# Patient Record
Sex: Female | Born: 1955 | Race: Black or African American | Hispanic: No | Smoking: Former smoker
Health system: Southern US, Community
[De-identification: ages and names within clinical notes are randomized; demographics above are authoritative.]

## PROBLEM LIST (undated history)

## (undated) DIAGNOSIS — Z973 Presence of spectacles and contact lenses: Secondary | ICD-10-CM

## (undated) DIAGNOSIS — F1010 Alcohol abuse, uncomplicated: Secondary | ICD-10-CM

## (undated) DIAGNOSIS — K219 Gastro-esophageal reflux disease without esophagitis: Secondary | ICD-10-CM

## (undated) DIAGNOSIS — Z923 Personal history of irradiation: Secondary | ICD-10-CM

## (undated) DIAGNOSIS — J449 Chronic obstructive pulmonary disease, unspecified: Secondary | ICD-10-CM

## (undated) DIAGNOSIS — H9190 Unspecified hearing loss, unspecified ear: Secondary | ICD-10-CM

## (undated) DIAGNOSIS — F810 Specific reading disorder: Secondary | ICD-10-CM

## (undated) DIAGNOSIS — R232 Flushing: Secondary | ICD-10-CM

## (undated) DIAGNOSIS — T7840XA Allergy, unspecified, initial encounter: Secondary | ICD-10-CM

## (undated) DIAGNOSIS — R7989 Other specified abnormal findings of blood chemistry: Secondary | ICD-10-CM

## (undated) DIAGNOSIS — F8181 Disorder of written expression: Secondary | ICD-10-CM

## (undated) DIAGNOSIS — J45909 Unspecified asthma, uncomplicated: Secondary | ICD-10-CM

## (undated) DIAGNOSIS — Z972 Presence of dental prosthetic device (complete) (partial): Secondary | ICD-10-CM

## (undated) DIAGNOSIS — J439 Emphysema, unspecified: Secondary | ICD-10-CM

## (undated) DIAGNOSIS — J302 Other seasonal allergic rhinitis: Secondary | ICD-10-CM

## (undated) DIAGNOSIS — C801 Malignant (primary) neoplasm, unspecified: Secondary | ICD-10-CM

## (undated) HISTORY — DX: Flushing: R23.2

## (undated) HISTORY — PX: BREAST LUMPECTOMY: SHX2

## (undated) HISTORY — PX: ABDOMINAL HYSTERECTOMY: SHX81

## (undated) HISTORY — DX: Allergy, unspecified, initial encounter: T78.40XA

## (undated) HISTORY — DX: Emphysema, unspecified: J43.9

---

## 1998-05-28 ENCOUNTER — Emergency Department (HOSPITAL_COMMUNITY): Admission: EM | Admit: 1998-05-28 | Discharge: 1998-05-28 | Payer: Self-pay | Admitting: Emergency Medicine

## 1998-05-29 ENCOUNTER — Encounter: Payer: Self-pay | Admitting: Emergency Medicine

## 1999-10-01 ENCOUNTER — Encounter: Payer: Self-pay | Admitting: Emergency Medicine

## 1999-10-01 ENCOUNTER — Emergency Department (HOSPITAL_COMMUNITY): Admission: EM | Admit: 1999-10-01 | Discharge: 1999-10-01 | Payer: Self-pay | Admitting: Emergency Medicine

## 1999-10-08 ENCOUNTER — Encounter: Admission: RE | Admit: 1999-10-08 | Discharge: 1999-10-08 | Payer: Self-pay | Admitting: Internal Medicine

## 1999-11-07 ENCOUNTER — Encounter: Admission: RE | Admit: 1999-11-07 | Discharge: 1999-11-07 | Payer: Self-pay | Admitting: Internal Medicine

## 1999-12-20 ENCOUNTER — Encounter: Admission: RE | Admit: 1999-12-20 | Discharge: 1999-12-20 | Payer: Self-pay | Admitting: Internal Medicine

## 2000-08-10 ENCOUNTER — Encounter: Payer: Self-pay | Admitting: Internal Medicine

## 2000-08-10 ENCOUNTER — Inpatient Hospital Stay (HOSPITAL_COMMUNITY): Admission: EM | Admit: 2000-08-10 | Discharge: 2000-08-11 | Payer: Self-pay | Admitting: Emergency Medicine

## 2000-08-18 ENCOUNTER — Encounter: Admission: RE | Admit: 2000-08-18 | Discharge: 2000-08-18 | Payer: Self-pay | Admitting: Internal Medicine

## 2000-08-20 ENCOUNTER — Ambulatory Visit (HOSPITAL_COMMUNITY): Admission: RE | Admit: 2000-08-20 | Discharge: 2000-08-20 | Payer: Self-pay | Admitting: *Deleted

## 2000-08-20 ENCOUNTER — Encounter: Payer: Self-pay | Admitting: *Deleted

## 2000-08-24 ENCOUNTER — Encounter: Admission: RE | Admit: 2000-08-24 | Discharge: 2000-08-24 | Payer: Self-pay | Admitting: Internal Medicine

## 2000-08-24 ENCOUNTER — Ambulatory Visit (HOSPITAL_COMMUNITY): Admission: RE | Admit: 2000-08-24 | Discharge: 2000-08-24 | Payer: Self-pay | Admitting: Internal Medicine

## 2000-09-18 ENCOUNTER — Encounter: Admission: RE | Admit: 2000-09-18 | Discharge: 2000-09-18 | Payer: Self-pay | Admitting: Internal Medicine

## 2001-04-26 ENCOUNTER — Emergency Department (HOSPITAL_COMMUNITY): Admission: EM | Admit: 2001-04-26 | Discharge: 2001-04-26 | Payer: Self-pay | Admitting: Emergency Medicine

## 2001-04-26 ENCOUNTER — Encounter: Payer: Self-pay | Admitting: Emergency Medicine

## 2001-07-26 ENCOUNTER — Emergency Department (HOSPITAL_COMMUNITY): Admission: EM | Admit: 2001-07-26 | Discharge: 2001-07-26 | Payer: Self-pay | Admitting: Emergency Medicine

## 2001-08-02 ENCOUNTER — Emergency Department (HOSPITAL_COMMUNITY): Admission: EM | Admit: 2001-08-02 | Discharge: 2001-08-02 | Payer: Self-pay

## 2001-11-17 ENCOUNTER — Encounter: Payer: Self-pay | Admitting: Emergency Medicine

## 2001-11-17 ENCOUNTER — Emergency Department (HOSPITAL_COMMUNITY): Admission: EM | Admit: 2001-11-17 | Discharge: 2001-11-17 | Payer: Self-pay | Admitting: Emergency Medicine

## 2002-07-22 ENCOUNTER — Emergency Department (HOSPITAL_COMMUNITY): Admission: EM | Admit: 2002-07-22 | Discharge: 2002-07-23 | Payer: Self-pay | Admitting: Emergency Medicine

## 2002-07-23 ENCOUNTER — Encounter: Payer: Self-pay | Admitting: Emergency Medicine

## 2002-09-12 ENCOUNTER — Emergency Department (HOSPITAL_COMMUNITY): Admission: EM | Admit: 2002-09-12 | Discharge: 2002-09-12 | Payer: Self-pay | Admitting: Emergency Medicine

## 2003-07-14 ENCOUNTER — Emergency Department (HOSPITAL_COMMUNITY): Admission: EM | Admit: 2003-07-14 | Discharge: 2003-07-14 | Payer: Self-pay | Admitting: Emergency Medicine

## 2003-08-14 ENCOUNTER — Emergency Department (HOSPITAL_COMMUNITY): Admission: EM | Admit: 2003-08-14 | Discharge: 2003-08-14 | Payer: Self-pay | Admitting: Emergency Medicine

## 2003-09-12 ENCOUNTER — Emergency Department (HOSPITAL_COMMUNITY): Admission: EM | Admit: 2003-09-12 | Discharge: 2003-09-12 | Payer: Self-pay | Admitting: Emergency Medicine

## 2003-09-27 ENCOUNTER — Emergency Department (HOSPITAL_COMMUNITY): Admission: EM | Admit: 2003-09-27 | Discharge: 2003-09-27 | Payer: Self-pay | Admitting: Otolaryngology

## 2004-03-14 ENCOUNTER — Emergency Department (HOSPITAL_COMMUNITY): Admission: EM | Admit: 2004-03-14 | Discharge: 2004-03-14 | Payer: Self-pay | Admitting: *Deleted

## 2004-09-18 ENCOUNTER — Emergency Department (HOSPITAL_COMMUNITY): Admission: EM | Admit: 2004-09-18 | Discharge: 2004-09-18 | Payer: Self-pay | Admitting: Emergency Medicine

## 2004-11-26 ENCOUNTER — Emergency Department (HOSPITAL_COMMUNITY): Admission: EM | Admit: 2004-11-26 | Discharge: 2004-11-26 | Payer: Self-pay | Admitting: Emergency Medicine

## 2005-09-27 ENCOUNTER — Emergency Department (HOSPITAL_COMMUNITY): Admission: EM | Admit: 2005-09-27 | Discharge: 2005-09-28 | Payer: Self-pay | Admitting: Emergency Medicine

## 2005-09-28 ENCOUNTER — Emergency Department (HOSPITAL_COMMUNITY): Admission: EM | Admit: 2005-09-28 | Discharge: 2005-09-28 | Payer: Self-pay | Admitting: Emergency Medicine

## 2005-12-08 ENCOUNTER — Ambulatory Visit: Payer: Self-pay | Admitting: Internal Medicine

## 2005-12-09 ENCOUNTER — Ambulatory Visit: Payer: Self-pay | Admitting: Internal Medicine

## 2005-12-12 ENCOUNTER — Ambulatory Visit: Payer: Self-pay | Admitting: Cardiology

## 2005-12-18 ENCOUNTER — Ambulatory Visit: Payer: Self-pay | Admitting: Family Medicine

## 2005-12-30 ENCOUNTER — Ambulatory Visit: Payer: Self-pay | Admitting: Cardiology

## 2005-12-30 ENCOUNTER — Ambulatory Visit (HOSPITAL_COMMUNITY): Admission: RE | Admit: 2005-12-30 | Discharge: 2005-12-30 | Payer: Self-pay | Admitting: Cardiology

## 2005-12-30 ENCOUNTER — Encounter: Payer: Self-pay | Admitting: Cardiology

## 2006-01-07 ENCOUNTER — Ambulatory Visit: Payer: Self-pay | Admitting: Cardiology

## 2006-02-09 ENCOUNTER — Emergency Department (HOSPITAL_COMMUNITY): Admission: EM | Admit: 2006-02-09 | Discharge: 2006-02-09 | Payer: Self-pay | Admitting: Emergency Medicine

## 2006-02-10 ENCOUNTER — Emergency Department (HOSPITAL_COMMUNITY): Admission: EM | Admit: 2006-02-10 | Discharge: 2006-02-10 | Payer: Self-pay | Admitting: Emergency Medicine

## 2006-02-13 ENCOUNTER — Ambulatory Visit: Payer: Self-pay | Admitting: *Deleted

## 2006-03-23 ENCOUNTER — Ambulatory Visit: Payer: Self-pay | Admitting: Internal Medicine

## 2006-04-02 ENCOUNTER — Ambulatory Visit (HOSPITAL_COMMUNITY): Admission: RE | Admit: 2006-04-02 | Discharge: 2006-04-02 | Payer: Self-pay | Admitting: Internal Medicine

## 2006-04-09 ENCOUNTER — Ambulatory Visit: Payer: Self-pay | Admitting: Family Medicine

## 2006-05-15 ENCOUNTER — Ambulatory Visit: Payer: Self-pay | Admitting: Internal Medicine

## 2006-07-14 ENCOUNTER — Ambulatory Visit: Payer: Self-pay | Admitting: Internal Medicine

## 2006-11-19 ENCOUNTER — Ambulatory Visit: Payer: Self-pay | Admitting: Internal Medicine

## 2006-12-30 ENCOUNTER — Emergency Department (HOSPITAL_COMMUNITY): Admission: EM | Admit: 2006-12-30 | Discharge: 2006-12-30 | Payer: Self-pay | Admitting: Emergency Medicine

## 2007-02-11 ENCOUNTER — Ambulatory Visit: Payer: Self-pay | Admitting: Internal Medicine

## 2007-02-12 ENCOUNTER — Ambulatory Visit (HOSPITAL_COMMUNITY): Admission: RE | Admit: 2007-02-12 | Discharge: 2007-02-12 | Payer: Self-pay | Admitting: Internal Medicine

## 2007-03-03 ENCOUNTER — Encounter (INDEPENDENT_AMBULATORY_CARE_PROVIDER_SITE_OTHER): Payer: Self-pay | Admitting: *Deleted

## 2007-06-07 ENCOUNTER — Ambulatory Visit: Payer: Self-pay | Admitting: Family Medicine

## 2007-07-04 ENCOUNTER — Ambulatory Visit: Payer: Self-pay | Admitting: Hospitalist

## 2007-07-06 ENCOUNTER — Inpatient Hospital Stay (HOSPITAL_COMMUNITY): Admission: EM | Admit: 2007-07-06 | Discharge: 2007-07-06 | Payer: Self-pay | Admitting: Emergency Medicine

## 2007-07-09 ENCOUNTER — Ambulatory Visit: Payer: Self-pay | Admitting: Internal Medicine

## 2007-07-09 LAB — CONVERTED CEMR LAB
Nitrite: NEGATIVE
Protein, ur: NEGATIVE mg/dL
Urine Glucose: NEGATIVE mg/dL
pH: 6 (ref 5.0–8.0)

## 2007-08-06 ENCOUNTER — Encounter: Payer: Self-pay | Admitting: Family Medicine

## 2007-08-06 ENCOUNTER — Ambulatory Visit: Payer: Self-pay | Admitting: Internal Medicine

## 2007-08-06 ENCOUNTER — Other Ambulatory Visit: Admission: RE | Admit: 2007-08-06 | Discharge: 2007-08-06 | Payer: Self-pay | Admitting: Family Medicine

## 2007-09-08 ENCOUNTER — Ambulatory Visit: Payer: Self-pay | Admitting: Internal Medicine

## 2007-09-08 LAB — CONVERTED CEMR LAB
ALT: 10 units/L (ref 0–35)
AST: 17 units/L (ref 0–37)
Alkaline Phosphatase: 91 units/L (ref 39–117)
Amphetamine Screen, Ur: NEGATIVE
BUN: 10 mg/dL (ref 6–23)
Barbiturate Quant, Ur: NEGATIVE
Basophils Absolute: 0 10*3/uL (ref 0.0–0.1)
Basophils Relative: 0 % (ref 0–1)
Benzodiazepines.: NEGATIVE
Calcium: 9.1 mg/dL (ref 8.4–10.5)
Chloride: 108 meq/L (ref 96–112)
Creatinine, Ser: 0.76 mg/dL (ref 0.40–1.20)
Eosinophils Absolute: 0.1 10*3/uL (ref 0.0–0.7)
Eosinophils Relative: 1 % (ref 0–5)
HCT: 39.4 % (ref 36.0–46.0)
HDL: 65 mg/dL (ref >39)
Hemoglobin: 13.4 g/dL (ref 12.0–15.0)
Lymphocytes Relative: 45 % (ref 12–46)
MCHC: 34 g/dL (ref 30.0–36.0)
Methadone: NEGATIVE
Monocytes Absolute: 0.6 10*3/uL (ref 0.1–1.0)
Platelets: 306 10*3/uL (ref 150–400)
Propoxyphene: NEGATIVE
RDW: 14.9 % (ref 11.5–15.5)
Total Bilirubin: 0.2 mg/dL — ABNORMAL LOW (ref 0.3–1.2)
Total CHOL/HDL Ratio: 2.3
VLDL: 24 mg/dL (ref 0–40)
Vitamin B-12: 261 pg/mL (ref 211–911)

## 2007-09-17 ENCOUNTER — Ambulatory Visit: Payer: Self-pay | Admitting: Internal Medicine

## 2007-09-23 ENCOUNTER — Ambulatory Visit (HOSPITAL_COMMUNITY): Admission: RE | Admit: 2007-09-23 | Discharge: 2007-09-23 | Payer: Self-pay | Admitting: Family Medicine

## 2007-10-21 ENCOUNTER — Encounter: Admission: RE | Admit: 2007-10-21 | Discharge: 2007-10-21 | Payer: Self-pay | Admitting: Family Medicine

## 2007-10-21 ENCOUNTER — Encounter (INDEPENDENT_AMBULATORY_CARE_PROVIDER_SITE_OTHER): Payer: Self-pay | Admitting: Diagnostic Radiology

## 2007-10-29 ENCOUNTER — Ambulatory Visit: Payer: Self-pay | Admitting: Family Medicine

## 2007-11-23 ENCOUNTER — Encounter (INDEPENDENT_AMBULATORY_CARE_PROVIDER_SITE_OTHER): Payer: Self-pay | Admitting: General Surgery

## 2007-11-23 ENCOUNTER — Ambulatory Visit (HOSPITAL_BASED_OUTPATIENT_CLINIC_OR_DEPARTMENT_OTHER): Admission: RE | Admit: 2007-11-23 | Discharge: 2007-11-23 | Payer: Self-pay | Admitting: General Surgery

## 2007-11-23 ENCOUNTER — Encounter: Admission: RE | Admit: 2007-11-23 | Discharge: 2007-11-23 | Payer: Self-pay | Admitting: General Surgery

## 2007-12-13 ENCOUNTER — Ambulatory Visit: Payer: Self-pay | Admitting: Oncology

## 2007-12-16 ENCOUNTER — Ambulatory Visit: Admission: RE | Admit: 2007-12-16 | Discharge: 2008-03-07 | Payer: Self-pay | Admitting: Radiation Oncology

## 2007-12-28 LAB — COMPREHENSIVE METABOLIC PANEL
Albumin: 4.6 g/dL (ref 3.5–5.2)
BUN: 8 mg/dL (ref 6–23)
CO2: 20 mEq/L (ref 19–32)
Calcium: 9.1 mg/dL (ref 8.4–10.5)
Chloride: 106 mEq/L (ref 96–112)
Glucose, Bld: 78 mg/dL (ref 70–99)
Potassium: 4 mEq/L (ref 3.5–5.3)
Total Protein: 7.4 g/dL (ref 6.0–8.3)

## 2007-12-28 LAB — CBC WITH DIFFERENTIAL/PLATELET
Basophils Absolute: 0 10*3/uL (ref 0.0–0.1)
Eosinophils Absolute: 0.1 10*3/uL (ref 0.0–0.5)
HGB: 13.1 g/dL (ref 11.6–15.9)
MCV: 104.8 fL — ABNORMAL HIGH (ref 81.0–101.0)
MONO#: 0.4 10*3/uL (ref 0.1–0.9)
MONO%: 6 % (ref 0.0–13.0)
NEUT#: 3.1 10*3/uL (ref 1.5–6.5)
RDW: 13.1 % (ref 11.3–14.5)
WBC: 6.2 10*3/uL (ref 3.9–10.0)
lymph#: 2.6 10*3/uL (ref 0.9–3.3)

## 2008-01-28 ENCOUNTER — Ambulatory Visit: Payer: Self-pay | Admitting: Vascular Surgery

## 2008-01-28 ENCOUNTER — Encounter: Payer: Self-pay | Admitting: Radiation Oncology

## 2008-01-28 ENCOUNTER — Ambulatory Visit (HOSPITAL_COMMUNITY): Admission: RE | Admit: 2008-01-28 | Discharge: 2008-01-28 | Payer: Self-pay | Admitting: Radiation Oncology

## 2008-02-07 ENCOUNTER — Ambulatory Visit: Payer: Self-pay | Admitting: Internal Medicine

## 2008-02-08 ENCOUNTER — Encounter: Payer: Self-pay | Admitting: Family Medicine

## 2008-04-24 ENCOUNTER — Emergency Department (HOSPITAL_COMMUNITY): Admission: EM | Admit: 2008-04-24 | Discharge: 2008-04-24 | Payer: Self-pay | Admitting: Emergency Medicine

## 2008-04-25 ENCOUNTER — Emergency Department (HOSPITAL_COMMUNITY): Admission: EM | Admit: 2008-04-25 | Discharge: 2008-04-25 | Payer: Self-pay | Admitting: Emergency Medicine

## 2008-07-05 ENCOUNTER — Ambulatory Visit: Payer: Self-pay | Admitting: Family Medicine

## 2008-07-21 ENCOUNTER — Encounter: Payer: Self-pay | Admitting: Family Medicine

## 2008-07-21 ENCOUNTER — Ambulatory Visit: Payer: Self-pay | Admitting: Internal Medicine

## 2008-07-25 ENCOUNTER — Ambulatory Visit (HOSPITAL_COMMUNITY): Admission: RE | Admit: 2008-07-25 | Discharge: 2008-07-25 | Payer: Self-pay | Admitting: Family Medicine

## 2008-09-04 ENCOUNTER — Ambulatory Visit: Payer: Self-pay | Admitting: Internal Medicine

## 2008-09-04 ENCOUNTER — Encounter: Payer: Self-pay | Admitting: Family Medicine

## 2008-09-04 LAB — CONVERTED CEMR LAB
ALT: 12 units/L (ref 0–35)
AST: 28 units/L (ref 0–37)
Alkaline Phosphatase: 104 units/L (ref 39–117)
BUN: 10 mg/dL (ref 6–23)
Basophils Relative: 1 % (ref 0–1)
Creatinine, Ser: 0.7 mg/dL (ref 0.40–1.20)
Eosinophils Absolute: 0.1 10*3/uL (ref 0.0–0.7)
HDL: 67 mg/dL (ref >39)
LDL Cholesterol: 15 mg/dL (ref 0–99)
Lymphs Abs: 1.7 10*3/uL (ref 0.7–4.0)
MCHC: 34.7 g/dL (ref 30.0–36.0)
MCV: 99 fL (ref 78.0–100.0)
Neutro Abs: 1.9 10*3/uL (ref 1.7–7.7)
Neutrophils Relative %: 48 % (ref 43–77)
Platelets: 224 10*3/uL (ref 150–400)
RBC: 3.93 M/uL (ref 3.87–5.11)
Sed Rate: 6 mm/hr (ref 0–22)
TSH: 0.901 microintl units/mL (ref 0.350–4.500)
Total CHOL/HDL Ratio: 2
VLDL: 55 mg/dL — ABNORMAL HIGH (ref 0–40)
WBC: 4.1 10*3/uL (ref 4.0–10.5)

## 2008-09-11 ENCOUNTER — Ambulatory Visit: Payer: Self-pay | Admitting: Internal Medicine

## 2008-10-23 ENCOUNTER — Encounter: Admission: RE | Admit: 2008-10-23 | Discharge: 2008-10-23 | Payer: Self-pay | Admitting: General Surgery

## 2008-11-02 ENCOUNTER — Ambulatory Visit: Payer: Self-pay | Admitting: *Deleted

## 2008-11-02 ENCOUNTER — Ambulatory Visit: Payer: Self-pay | Admitting: Internal Medicine

## 2008-11-12 ENCOUNTER — Emergency Department (HOSPITAL_COMMUNITY): Admission: EM | Admit: 2008-11-12 | Discharge: 2008-11-12 | Payer: Self-pay | Admitting: Emergency Medicine

## 2008-11-16 ENCOUNTER — Ambulatory Visit: Payer: Self-pay | Admitting: Internal Medicine

## 2008-11-30 ENCOUNTER — Ambulatory Visit: Payer: Self-pay | Admitting: Family Medicine

## 2008-12-13 ENCOUNTER — Ambulatory Visit: Payer: Self-pay | Admitting: Internal Medicine

## 2008-12-20 ENCOUNTER — Ambulatory Visit (HOSPITAL_COMMUNITY): Admission: RE | Admit: 2008-12-20 | Discharge: 2008-12-20 | Payer: Self-pay | Admitting: Family Medicine

## 2009-01-01 ENCOUNTER — Ambulatory Visit: Payer: Self-pay | Admitting: Internal Medicine

## 2009-04-27 ENCOUNTER — Ambulatory Visit: Payer: Self-pay | Admitting: Internal Medicine

## 2009-06-01 ENCOUNTER — Ambulatory Visit: Payer: Self-pay | Admitting: Family Medicine

## 2009-07-13 ENCOUNTER — Ambulatory Visit: Payer: Self-pay | Admitting: Family Medicine

## 2009-09-25 ENCOUNTER — Ambulatory Visit: Payer: Self-pay | Admitting: Family Medicine

## 2009-09-25 LAB — CONVERTED CEMR LAB
AST: 21 units/L (ref 0–37)
Alkaline Phosphatase: 150 units/L — ABNORMAL HIGH (ref 39–117)
BUN: 4 mg/dL — ABNORMAL LOW (ref 6–23)
Calcium: 9.4 mg/dL (ref 8.4–10.5)
Creatinine, Ser: 0.71 mg/dL (ref 0.40–1.20)
HDL: 46 mg/dL (ref >39)
TSH: 0.905 microintl units/mL (ref 0.350–4.500)
Total Bilirubin: 0.3 mg/dL (ref 0.3–1.2)
Total CHOL/HDL Ratio: 3.2
VLDL: 14 mg/dL (ref 0–40)

## 2009-09-27 ENCOUNTER — Emergency Department (HOSPITAL_COMMUNITY): Admission: EM | Admit: 2009-09-27 | Discharge: 2009-09-27 | Payer: Self-pay | Admitting: Emergency Medicine

## 2009-10-24 ENCOUNTER — Encounter: Admission: RE | Admit: 2009-10-24 | Discharge: 2009-10-24 | Payer: Self-pay | Admitting: General Surgery

## 2009-10-30 ENCOUNTER — Ambulatory Visit: Payer: Self-pay | Admitting: Family Medicine

## 2010-02-06 ENCOUNTER — Ambulatory Visit: Payer: Self-pay | Admitting: Family Medicine

## 2010-02-20 ENCOUNTER — Ambulatory Visit (HOSPITAL_COMMUNITY): Admission: RE | Admit: 2010-02-20 | Discharge: 2010-02-20 | Payer: Self-pay | Admitting: Gastroenterology

## 2010-02-22 ENCOUNTER — Ambulatory Visit: Payer: Self-pay | Admitting: Family Medicine

## 2010-03-01 ENCOUNTER — Ambulatory Visit (HOSPITAL_COMMUNITY): Admission: RE | Admit: 2010-03-01 | Discharge: 2010-03-01 | Payer: Self-pay | Admitting: Family Medicine

## 2010-04-18 ENCOUNTER — Ambulatory Visit: Payer: Self-pay | Admitting: Family Medicine

## 2010-04-22 ENCOUNTER — Encounter (INDEPENDENT_AMBULATORY_CARE_PROVIDER_SITE_OTHER): Payer: Self-pay | Admitting: Family Medicine

## 2010-04-22 ENCOUNTER — Ambulatory Visit: Payer: Self-pay | Admitting: Internal Medicine

## 2010-07-07 ENCOUNTER — Encounter: Payer: Self-pay | Admitting: Family Medicine

## 2010-08-16 ENCOUNTER — Other Ambulatory Visit: Payer: Self-pay | Admitting: Gastroenterology

## 2010-08-20 ENCOUNTER — Ambulatory Visit
Admission: RE | Admit: 2010-08-20 | Discharge: 2010-08-20 | Disposition: A | Discharge status: Home / Self Care | Payer: Self-pay | Source: Ambulatory Visit | Attending: Gastroenterology | Admitting: Gastroenterology

## 2010-08-28 ENCOUNTER — Ambulatory Visit (HOSPITAL_COMMUNITY)
Admission: RE | Admit: 2010-08-28 | Discharge: 2010-08-28 | Disposition: A | Discharge status: Home / Self Care | Payer: Self-pay | Source: Ambulatory Visit | Attending: Gastroenterology | Admitting: Gastroenterology

## 2010-08-28 DIAGNOSIS — K2990 Gastroduodenitis, unspecified, without bleeding: Secondary | ICD-10-CM | POA: Insufficient documentation

## 2010-08-28 DIAGNOSIS — J438 Other emphysema: Secondary | ICD-10-CM | POA: Insufficient documentation

## 2010-08-28 DIAGNOSIS — R1310 Dysphagia, unspecified: Secondary | ICD-10-CM | POA: Insufficient documentation

## 2010-08-28 DIAGNOSIS — K2970 Gastritis, unspecified, without bleeding: Secondary | ICD-10-CM | POA: Insufficient documentation

## 2010-08-28 DIAGNOSIS — Q391 Atresia of esophagus with tracheo-esophageal fistula: Secondary | ICD-10-CM | POA: Insufficient documentation

## 2010-08-28 DIAGNOSIS — J309 Allergic rhinitis, unspecified: Secondary | ICD-10-CM | POA: Insufficient documentation

## 2010-08-28 DIAGNOSIS — E538 Deficiency of other specified B group vitamins: Secondary | ICD-10-CM | POA: Insufficient documentation

## 2010-08-28 DIAGNOSIS — Z7982 Long term (current) use of aspirin: Secondary | ICD-10-CM | POA: Insufficient documentation

## 2010-08-28 DIAGNOSIS — Q393 Congenital stenosis and stricture of esophagus: Secondary | ICD-10-CM | POA: Insufficient documentation

## 2010-08-28 DIAGNOSIS — K5730 Diverticulosis of large intestine without perforation or abscess without bleeding: Secondary | ICD-10-CM | POA: Insufficient documentation

## 2010-08-28 DIAGNOSIS — Z853 Personal history of malignant neoplasm of breast: Secondary | ICD-10-CM | POA: Insufficient documentation

## 2010-08-28 DIAGNOSIS — K648 Other hemorrhoids: Secondary | ICD-10-CM | POA: Insufficient documentation

## 2010-08-28 DIAGNOSIS — Z1211 Encounter for screening for malignant neoplasm of colon: Secondary | ICD-10-CM | POA: Insufficient documentation

## 2010-09-03 NOTE — Op Note (Signed)
  [name], DELAHOZ              ACCOUNT NO.:  [id number]  MEDICAL RECORD NO.:  [id number]           PATIENT TYPE:  O  LOCATION:  WLEN                         FACILITY:  [facility]  PHYSICIAN:  Shirley Friar, MDDATE OF BIRTH:  [birth year]  DATE OF PROCEDURE:  08/28/2010 DATE OF DISCHARGE:                              OPERATIVE REPORT   PROCEDURE:  Upper endoscopy.  INDICATIONS:  Dysphagia, abnormal barium swallow showing a proximal esophageal web.  MEDICATIONS: 1. Fentanyl 25 mcg IV. 2. Versed 3 mg IV. 3. Cetacaine spray x2. 4. Additional medicine given for preceding colonoscopy.  FINDINGS:  Endoscope was inserted through oropharynx and esophagus was intubated.  The upper esophageal sphincter was noted to be approximately 15 cm from the incisors and in this area was a nonobstructing benign appearing small esophageal web.  Endoscope was easily advanced down into the distal esophagus where the gastroesophageal junction was noted to be 38 cm from the incisors.  The endoscope was then advanced into the stomach down to the duodenal bulb and second portion of the duodenum. Second portion of the duodenum and duodenal bulb were unremarkable.  In the distal stomach was focal edema and erythema consistent with mild gastritis.  No other mucosal abnormalities were seen in the stomach. Retroflexion revealed normal proximal stomach.  Endoscope was straightened and withdrawn back into the proximal esophagus where the small esophageal web was seen.  Endoscope was then advanced into the stomach again and a spring tip guidewire was advanced through the working channel of the endoscope.  With visualization of the tip of the wire in the stomach, the endoscope was pulled back keeping the wire in place and the endoscope was withdrawn.  The guidewire markings were noted and adequate length distal to the gastroesophageal junction was noted.  A 14- mm Savary dilator was inserted over the guidewire  with minimal resistance and small amount of heme seen on pullback.  Then in sequential fashion, a 15-mm and 16-mm dilators were inserted over the guidewire with mild resistance to 15 mm and mild resistance to 16 mm with heme noted on both dilations.  After dilation with the 16-mm dilator, the dilator and guidewire were removed together.  The endoscope was advanced back into the oropharynx and the esophagus.  Proximal esophagus was visualized to note successful dilation.  Endoscope was then withdrawn to confirm the above findings.  ASSESSMENT: 1. 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 2. Mild gastritis.  PLAN: 1. Continue twice a day Protonix for heartburn. 2. If dysphagia symptoms recur, then may need additional therapy. 3. Followup as needed.     Shirley Friar, MD     VCS/MEDQ  D:  08/28/2010  T:  08/28/2010  Job:  [id number]  cc:   Maurice March, M.D. Fax: [phone]  Electronically Signed by Charlott Rakes MD on 09/03/2010 03:07:52 PM

## 2010-09-03 NOTE — Op Note (Signed)
  [name], GARCIAGARCIA              ACCOUNT NO.:  [id number]  MEDICAL RECORD NO.:  [id number]           PATIENT TYPE:  O  LOCATION:  WLEN                         FACILITY:  [facility]  PHYSICIAN:  Shirley Friar, MDDATE OF BIRTH:  10-07-1955  DATE OF PROCEDURE: DATE OF DISCHARGE:                              OPERATIVE REPORT   INDICATIONS:  Screening.  MEDICATIONS:  Fentanyl 100 mcg IV, Versed 9 mg IV.  FINDINGS:  Rectal exam was unremarkable.  Pediatric colonoscope was inserted into a well prepped colon, advanced to cecum, where ileocecal valve and appendiceal orifice were identified.  On careful withdrawal of the colonoscope, there were few small diverticula seen in the sigmoid colon.  Retroflexion revealed small internal hemorrhoids.  Colonoscopy was otherwise normal.  ASSESSMENT: 1. Few sigmoid diverticula. 2. Small internal hemorrhoids.  PLAN:  Repeat colonoscopy in 10 years.     Shirley Friar, MD   VCS/MEDQ  D:  08/28/2010  T:  08/28/2010  Job:  [id number]  cc:   Maurice March, M.D. Fax: [phone]  Electronically Signed by Charlott Rakes MD on 09/03/2010 03:05:46 PM

## 2010-09-04 LAB — RAPID URINE DRUG SCREEN, HOSP PERFORMED
Amphetamines: NOT DETECTED
Opiates: NOT DETECTED
Tetrahydrocannabinol: NOT DETECTED

## 2010-09-27 ENCOUNTER — Other Ambulatory Visit: Payer: Self-pay | Admitting: Family Medicine

## 2010-09-27 DIAGNOSIS — Z9889 Other specified postprocedural states: Secondary | ICD-10-CM

## 2010-10-14 ENCOUNTER — Emergency Department (HOSPITAL_COMMUNITY): Payer: Self-pay

## 2010-10-14 ENCOUNTER — Emergency Department (HOSPITAL_COMMUNITY)
Admission: EM | Admit: 2010-10-14 | Discharge: 2010-10-14 | Disposition: A | Discharge status: Home / Self Care | Payer: Self-pay | Attending: Emergency Medicine | Admitting: Emergency Medicine

## 2010-10-14 DIAGNOSIS — J4489 Other specified chronic obstructive pulmonary disease: Secondary | ICD-10-CM | POA: Insufficient documentation

## 2010-10-14 DIAGNOSIS — R05 Cough: Secondary | ICD-10-CM | POA: Insufficient documentation

## 2010-10-14 DIAGNOSIS — R079 Chest pain, unspecified: Secondary | ICD-10-CM | POA: Insufficient documentation

## 2010-10-14 DIAGNOSIS — Z79899 Other long term (current) drug therapy: Secondary | ICD-10-CM | POA: Insufficient documentation

## 2010-10-14 DIAGNOSIS — J449 Chronic obstructive pulmonary disease, unspecified: Secondary | ICD-10-CM | POA: Insufficient documentation

## 2010-10-14 DIAGNOSIS — R748 Abnormal levels of other serum enzymes: Secondary | ICD-10-CM | POA: Insufficient documentation

## 2010-10-14 DIAGNOSIS — K219 Gastro-esophageal reflux disease without esophagitis: Secondary | ICD-10-CM | POA: Insufficient documentation

## 2010-10-14 DIAGNOSIS — R0602 Shortness of breath: Secondary | ICD-10-CM | POA: Insufficient documentation

## 2010-10-14 DIAGNOSIS — R059 Cough, unspecified: Secondary | ICD-10-CM | POA: Insufficient documentation

## 2010-10-14 LAB — DIFFERENTIAL
Basophils Absolute: 0 10*3/uL (ref 0.0–0.1)
Basophils Relative: 0 % (ref 0–1)
Eosinophils Relative: 2 % (ref 0–5)
Monocytes Absolute: 0.5 10*3/uL (ref 0.1–1.0)
Neutro Abs: 4.4 10*3/uL (ref 1.7–7.7)

## 2010-10-14 LAB — POCT CARDIAC MARKERS
Myoglobin, poc: 39.2 ng/mL (ref 12–200)
Myoglobin, poc: 27.3 ng/mL (ref 12–200)

## 2010-10-14 LAB — COMPREHENSIVE METABOLIC PANEL
BUN: 2 mg/dL — ABNORMAL LOW (ref 6–23)
CO2: 20 mEq/L (ref 19–32)
Chloride: 108 mEq/L (ref 96–112)
Creatinine, Ser: 0.54 mg/dL (ref 0.4–1.2)
GFR calc non Af Amer: >60 mL/min (ref >60)
Glucose, Bld: 86 mg/dL (ref 70–99)
Total Bilirubin: 0.3 mg/dL (ref 0.3–1.2)

## 2010-10-14 LAB — CBC
Hemoglobin: 14.8 g/dL (ref 12.0–15.0)
MCHC: 35.2 g/dL (ref 30.0–36.0)
RDW: 13.9 % (ref 11.5–15.5)

## 2010-10-14 MED ORDER — IOHEXOL 300 MG/ML  SOLN
75.0000 mL | Freq: Once | INTRAMUSCULAR | Status: AC | PRN
  Administered 2010-10-14: 75 mL via INTRAVENOUS

## 2010-10-29 NOTE — Discharge Summary (Signed)
[name], [name]              ACCOUNT NO.:  [id number]   MEDICAL RECORD NO.:  [id number]          PATIENT TYPE:  INP   LOCATION:  6703                         FACILITY:  [facility]   PHYSICIAN:  Eliseo Gum, M.D.   DATE OF BIRTH:  01-30-1956   DATE OF ADMISSION:  07/04/2007  DATE OF DISCHARGE:  07/06/2007                               DISCHARGE SUMMARY   CONSULTANTS:  None.   DISCHARGE DIAGNOSES:  1. Pyelonephritis.  2. Macrocytosis.  3. Chronic obstructive pulmonary disease.  4. Gastroesophageal reflux disease.  5. Hysterectomy.   DISCHARGE MEDICATIONS:  1. Ciprofloxacin 400 mg for 7 days.  2. Spiriva 18 mcg one puff inhaled daily.  3. Tylenol 650 mg every 8 hours for pain.  4. Doxepin 25 mg orally at bedtime.  5. Flonase 15 mcg spray, two sprays per nostril daily.  6. Ferrous sulfate 325 mg orally twice a day.  7. Multivitamins one tab daily.  8. Allegra 180 mg orally daily as needed.  9. Levsin 0.125 mg orally every 4 hours as needed.   DISPOSITION:  [name] has an appointment scheduled within 2 weeks  with [facility] for resolution of her current symptoms; also, she has to  follow up her anemia, methylmalonic acid result.   PROCEDURE:  Chest x-ray that shows no active cardiopulmonary disease.   HISTORY OF PRESENT ILLNESS:  [name] is a 55 year old female with no  significant past medical history who presented to the ER complaining of  headache, fever, chills and back pain since 4 days prior to admission.  She also related increase in urine frequency and incontinence since  around the same time.   PHYSICAL EXAM:  Temperature 104, blood pressure 118/72, pulse 110,  respirations 30, oxygen saturation 94% on room air.  LUNGS:  Bilateral air movement, bilateral decreased breath sounds, she  has bilateral rales, no rhonchi.  CARDIOVASCULAR:  S1, S2 normal, regular rhythm and rate, no murmur.  GASTROINTESTINAL:  Bowel sounds positive, soft, some tenderness on  palpation in the suprapubic area.  She was CVA positive on the right  side.  NEUROLOGICAL EXAM:  Alert and oriented, no focal neurologic findings.   LABS ON ADMISSION:  UA hazy, small hemoglobin, leukocyte esterase many.  Micro:  White blood cells 21-50, red blood cells 7-10, many bacteria.  Electrolytes:  Sodium 136, potassium 3.8, chloride 108, bicarbonate 20,  BUN 4, creatinine 0.9.  White blood cell 14.1, hemoglobin 12.3,  platelets 236,000, MCV 800.8.   HOSPITAL COURSE:  1. Pyelonephritis.  On admission patient was febrile.  She was started      on IV ciprofloxacin.  She was having ibuprofen for fever.  We      started her on IV fluid, normal saline 125 mL per hour.  Urine      culture grows E. coli 6000 colonies.  Blood culture negative x2.      White blood cells on the second day of admission decreasing 9.8,      neutrophil absolute count 7.  On the third day of hospitalization      patient was afebrile, decreasing urine  frequency with decreasing of      flank pain.   1. Anemia, macrocytosis.  Patient on admission MCV was 100.1, we were      considering that this may be secondary to alcohol abuse.  Her B12      was lower borderline normal 367.  Methylmalonic acid was ordered,      this is pending.  Folate 20.  Her anemia we are considering that      she might have iron deficiency anemia or anemia of chronic disease.      Her iron level is low, is 22, and her total iron binding capacity      is 272 and her ferritin level was 130.  The ferritin level is      slightly increased but this can be secondary to the acute      infection, so patient needs to be followed up to really determine      the cause of her anemia.  We are going to start her on Iron at this      moment.   1. COPD.  The patient relates no worsening of her dyspnea or cough.      This is pretty stable.  She was continued on Spiriva and on      albuterol inhaler as needed.  She was counseled for smoking       cessation.  X-ray as result above was normal.   DISCHARGE LABORATORY AND VITALS:  On the date of discharge white blood  cell 9.8, hemoglobin 11, hematocrit 32, MCV 101, platelets 205,000.  The  patient was afebrile, temperature 98.3, blood pressure 132/83,  respirations 20, pulse 78, oxygen saturation 99% on room air.  The  patient was discharged in good condition.      Hartley Barefoot, MD  Electronically Signed      Eliseo Gum, M.D.  Electronically Signed    BR/MEDQ  D:  07/06/2007  T:  07/06/2007  Job:  [id number]   cc:   Melvern Banker

## 2010-10-29 NOTE — Op Note (Signed)
[name], [name]              ACCOUNT NO.:  [id number]   MEDICAL RECORD NO.:  [id number]          PATIENT TYPE:  AMB   LOCATION:  DSC                          FACILITY:  [facility]   PHYSICIAN:  Ollen Gross. Vernell Morgans, M.D. DATE OF BIRTH:  10/29/1955   DATE OF PROCEDURE:  11/23/2007  DATE OF DISCHARGE:                               OPERATIVE REPORT   PREOPERATIVE DIAGNOSIS:  Left breast ductal carcinoma in situ.   POSTOPERATIVE DIAGNOSE:  Left breast ductal carcinoma in situ.   PROCEDURES:  Left breast needle-localized lumpectomy and sentinel node  biopsy with injection of blue dye.   SURGEON:  Vernice Jefferson, MD   ANESTHESIA:  General via LMA.   PROCEDURE:  After informed consent was obtained, the patient was brought  to the operating room and placed in the supine position on the operating  room table.  After induction of general anesthesia, the patient's left  breast and axilla were prepped with Betadine and draped in usual sterile  manner.  Early on the day the patient had undergone injection of 1 mCi  of technetium sulfur colloid in the subareolar position.  The patient  earlier in the day had also undergone a wire localization procedure and  the wire was entering the breast superiorly, just lateral to the nipple  and areola, and heading inferiorly.  The 2 mL of methylene blue and 3 mL  of injectable saline were then injected and also in the subareolar  position and the breast was massaged for several minutes.  Using a  NeoProbe a single hot spot was identified in the left axilla.  A small  transverse incision was made in the axilla overlying this hot spot with  a 15-blade knife.  This incision was carried down through the skin and  subcutaneous tissue sharply with the electrocautery until the axilla was  entered.  Blunt dissection was then carried out until a hot blue lymph  node was identified.  This lymph node was excised sharply with the  electrocautery and the lymphatics were  clamped with a hemostat, divided,  and ligated with a 3-0 Vicryl tie.  The ex vivo counts on the sentinel  node were around 25.  There were no other hot spots or palpable lymph  nodes or blue dye identifiable in the axilla.  This was sent as sentinel  node #1.  The deep layer of the wound was then closed with interrupted 3-  0 Vicryl stitches and the skin was closed with a running 4-0 Monocryl  subcuticular stitch.  Attention was then turned to the left breast, a  curvilinear incision was made along the upper portion of the breast,  just beneath the wire and just above the areola.  This was done with 15-  blade knife.  This incision was carried down through the skin and  subcutaneous tissue sharply with electrocautery.  The path of the wire  was able to be identified.  A circular portion of breast tissue was  removed around the path of the wire.  This was taken all the way down to  the chest wall.  This was all done sharply with the electrocautery.  Once the specimen was removed, it was oriented with the paint set  according to the key and the assigned colors to the margins.  The  specimen was then sent for a specimen radiograph, which showed the clip  and the calcification to be in the center of the mass.  It was then sent  to pathology for further evaluation.  Hemostasis was achieved using the  Bovie electrocautery.  The wound was irrigated with copious amounts of  saline.  The wound was infiltrated with 0.25% Marcaine.  The deep layer  of the wound was then closed with interrupted 3-0 Vicryl stitches and  the skin was  closed with a running 4-0 Monocryl subcuticular stitch.  Dermabond  dressings were applied.  The patient tolerated well.  At the in the  case, all needle, sponge, and counts were correct.  The patient was then  awakened and taken to recovery room in stable condition.      Ollen Gross. Vernell Morgans, M.D.  Electronically Signed     PST/MEDQ  D:  11/23/2007  T:  11/24/2007   Job:  [id number]

## 2010-11-01 NOTE — Discharge Summary (Signed)
[facility]  Patient:    [name], [name]                       MRN: [id number] Adm. Date:  [id number] Disc. Date: [id number] Attending:  Phifer, Harriett Sine Welcome Dictator:   Doreatha Martin, M.D.                           Discharge Summary  DISCHARGE DIAGNOSES: 1. Noncardiac chest pain. 2. Gastroesophageal reflux disease. 3. Postmenopausal. 4. Status post hysterectomy. 5. Tobacco abuse.  DISCHARGE MEDICATION:  Prevacid 30 mg p.o. q.d.  HISTORY OF PRESENT ILLNESS:  [name] is a 55 year old, African-American female with past medical history significant for gastroesophageal reflux disease who presents today with complaints of sudden onset of shortness of breath and later with a very brief period of fleeting chest pain that awoke her early this morning.  The patient states that she felt fine before going to bed the night prior to admission.  She states that she had an episode of shortness of breath similar to this one approximately one year ago.  The patient denies any nausea, vomiting or diarrhea.  She also denies fevers, chills or malaise.  No abdominal pain and no palpitations.  She states that the chest pain occurred while in the emergency room today and it was very brief over her left chest without radiation anywhere in her body.  The patient has had a nonproductive cough for the last several months secondary to tobacco abuse.  ALLERGY:  PENICILLIN.  FAMILY HISTORY:  Significant for a sister with some type of heart problems at the age of 66.  PHYSICAL EXAMINATION:  VITAL SIGNS:  Temperature 97.3, pulse 73, blood pressure 116/69, respiratory rate 26, oxygen saturations 98% on room air.  GENERAL:  This is a very, thin, petite, African-American female who is alert and oriented x 3 in no apparent distress.  HEENT:  Normocephalic, atraumatic.  PERRLA.  No oropharyngeal lesions or exudates.  NECK:  No JVD or lymphadenopathy.  No  thyromegaly.  LUNGS:  Rhonchi in left lower lobe, no wheezing with good air movement.  CARDIOVASCULAR:  Regular rate and rhythm with S1, S2, no murmurs, rubs or gallops.  ABDOMEN:  Thin, soft, nontender, nondistended with active bowel sounds.  EXTREMITIES:  No edema.  No cords.  Pedal pulses 2+ bilaterally.  NEUROLOGIC:  Alert and oriented x 3.  Cranial nerves II-XII intact.  No motor or sensory deficits.  LABORATORY DATA AND X-RAY FINDINGS:  White blood count 5.1, hemoglobin 15.0, platelets 286.  Sodium 141, potassium 3.6, chloride 115, CO2 16, BUN 15, creatinine 0.7, glucose 106, calcium 8.2.  Protein 6.1, total bilirubin 0.4, AST 20, ALT 12.  PT 12.9, INR 1.0, PTT 26.  ABG on room air with pH 7.37, pCO2 31, pO2 87.  Chest x-ray with no apparent disease.  EKG with normal sinus rhythm with a slight tachycardia with ventricular response of 101 beats per minute.  No ST or T wave changes.  CT of the chest was negative for PE and CT of the lower extremities was also negative for DVT.  HOSPITAL COURSE:  #1 - ATYPICAL CHEST PAIN:  The patient was admitted to rule out acute myocardial infarction versus pulmonary embolism.  The patient has, at most, two risk factors for coronary artery disease, namely chronic daily tobacco abuse and questionable family history of heart disease at a young age. Myocardial infarction  was ruled out with three sets of negative cardiac enzymes.  Pulmonary embolism was ruled out with a negative CT of the chest and lower extremities.  The patient did not suffer any arrhythmias or further shortness of breath during this admission.  She did complain of fleeting chest pain that was stabbing over her sternum approximately twice during this admission.  EKGs were obtained at the time of the chest pain and no changes were noted.  The patient continued to do well and it is possible that her chest pain could be related to anxiety.  We could not find a physiologic etiology  of her chest pain.  The patient was discharged in stable condition and is to be followed up in our clinic as an outpatient.  #2 - GASTROESOPHAGEAL REFLUX DISEASE:  It is possible that she has been suffering with heartburn and her chest discomfort may be associated to her GERD.  The patient was placed on Protonix 40 mg p.o. q.d. during this admission.  The patient was discharged home on Prevacid 30 mg p.o. q.d. and was given samples for 10 weeks.  #3 - POSTMENOPAUSAL:  The patient, at some point, complained of having had night sweats and some anxiety at night.  She states that this occurred a few times prior to admission while she was at home.  We obtained an FSH level on this patient and it came back with a level of 113, which is consistent with postmenopause.  The patient had been discharged at the time that this result was in.  A note has been made for this issue to be addressed in the outpatient setting.  The patient would probably benefit from starting hormone replacement therapy.  It is also possible that her chest pain is associated with anxiety or with changes that may have something to do with the postmenopausal period.  #4 - ANEMIA:  There is a question of this patient possibly having an anemia. There were two sets of labs on the day of admission that showed a hemoglobin of 15.0 g and subsequently two other sets of labs that showed hemoglobin of 12.0 and 12.1 respectively.  No workup was done in relation to this anemia as it was borderline.  It would be helpful to obtain a level as an outpatient to confirm the presence or absence of an anemia in this patient and for it to be worked up accordingly.  FOLLOWUP:  The patient is to follow up in the acute care clinic in approximately one week. DD:  08/12/00 TD:  08/13/00 Job: [id number]

## 2010-11-01 NOTE — Assessment & Plan Note (Signed)
[facility]                              CARDIOLOGY OFFICE NOTE   ERIC, MORGANTI                       MRN:          [id number]  DATE:01/07/2006                            DOB:          02/12/1956    PRIMARY CARE PHYSICIAN:  Tresa Endo L. Philipp Deputy, MD with [facility]   REASON FOR VISIT:  Followup cardiac testing.   HISTORY OF PRESENT ILLNESS:  I saw Ms. Wagoner back in late June.  She was  referred at that time with a history of chest pain with fairly atypical  features, although with cardiac risk factors, including a strong family  history as well as ongoing tobacco use.  I referred her for a 2D  echocardiogram, which revealed a normal left ventricular function with an  ejection fraction of 65-70% and no regional wall motion abnormalities.  She  had no major valvular abnormalities noted.  She also underwent a myocardial  perfusion study which revealed no clear evidence of ischemia or scar.  This  was at somewhat submaximal work load, although with a peak heart rate of 161  beats per minute.  I reviewed this information with the patient today and  reassured her.  Her electrocardiogram shows a sinus rhythm with occasional  premature supraventricular complexes.  She also tells me that she has been  using Goody powders up to four times a day, and we talked about problems  with reflux and possible peptic ulcer disease or gastritis.  I note that she  is still on Protonix.   ALLERGIES:  No known drug allergies.   PRESENT MEDICATIONS:  1.  Protonix 40 mg p.o. daily.  2.  Allegra 180 mg p.o. daily.  3.  Goody powders 3-4 a day.   REVIEW OF SYSTEMS:  As described in the present illness.   PHYSICAL EXAMINATION:  VITAL SIGNS: Blood pressure 134/70, heart rate 81.  Weight 92 pounds.  NECK:  Supple without obvious jugular venous pressure.  LUNGS:  Clear without labored breathing.  CARDIAC:  Regular rate and rhythm without  murmur, rub or gallop.  EXTREMITIES:  No pitting edema.   IMPRESSION/RECOMMENDATIONS:  1.  Chest pain, fairly atypical and likely noncardiac, based on testing      outlined above.  Would suggest evaluation for other possible etiologies,      particularly gastrointestinal, based on her frequent use of Goody      powders.  She should also strive for risk factor modification and      smoking cessation.  She will plan to continue followup with [facility].  2.  We can see her back as needed.                                Jonelle Sidle, MD    SGM/MedQ  DD:  01/07/2006  DT:  01/07/2006  Job #:  [id number]   cc:   Tresa Endo L. Philipp Deputy, MD

## 2011-03-06 LAB — IRON AND TIBC
Iron: 22 — ABNORMAL LOW
TIBC: 272
UIBC: 250

## 2011-03-06 LAB — URINE CULTURE: Colony Count: 60000

## 2011-03-06 LAB — BASIC METABOLIC PANEL
CO2: 20
Calcium: 7.9 — ABNORMAL LOW
Chloride: 110
GFR calc Af Amer: >60
Sodium: 137

## 2011-03-06 LAB — URINALYSIS, ROUTINE W REFLEX MICROSCOPIC
Nitrite: NEGATIVE
Protein, ur: NEGATIVE
Specific Gravity, Urine: 1.008
Urobilinogen, UA: 1

## 2011-03-06 LAB — COMPREHENSIVE METABOLIC PANEL
ALT: 25
AST: 30
CO2: 22
Chloride: 105
GFR calc Af Amer: >60
GFR calc non Af Amer: >60
Potassium: 3.3 — ABNORMAL LOW
Sodium: 134 — ABNORMAL LOW
Total Bilirubin: 0.5

## 2011-03-06 LAB — CBC
Hemoglobin: 11 — ABNORMAL LOW
MCHC: 33.9
MCHC: 34.1
MCV: 100.8 — ABNORMAL HIGH
MCV: 101 — ABNORMAL HIGH
RBC: 3.18 — ABNORMAL LOW
RBC: 3.59 — ABNORMAL LOW
WBC: 9.8

## 2011-03-06 LAB — DIFFERENTIAL
Basophils Relative: 0
Basophils Relative: 0
Eosinophils Absolute: 0.1
Eosinophils Relative: 1
Lymphs Abs: 1.4
Monocytes Absolute: 0.9
Monocytes Absolute: 1.3 — ABNORMAL HIGH
Monocytes Relative: 13 — ABNORMAL HIGH
Monocytes Relative: 6
Neutro Abs: 7
Neutrophils Relative %: 81 — ABNORMAL HIGH

## 2011-03-06 LAB — URINE MICROSCOPIC-ADD ON

## 2011-03-06 LAB — I-STAT 8, (EC8 V) (CONVERTED LAB)
Bicarbonate: 20.7
Glucose, Bld: 104 — ABNORMAL HIGH
Sodium: 136
TCO2: 22
pCO2, Ven: 35.3 — ABNORMAL LOW
pH, Ven: 7.376 — ABNORMAL HIGH

## 2011-03-06 LAB — RETICULOCYTES: Retic Count, Absolute: 46.9

## 2011-03-06 LAB — CULTURE, BLOOD (ROUTINE X 2)

## 2011-03-06 LAB — FOLATE: Folate: >20

## 2011-03-06 LAB — FERRITIN: Ferritin: 130 (ref 10–291)

## 2011-03-06 LAB — INFLUENZA A+B VIRUS AG-DIRECT(RAPID): Influenza B Ag: NEGATIVE

## 2011-03-13 LAB — CBC
HCT: 33.3 — ABNORMAL LOW
Hemoglobin: 11.8 — ABNORMAL LOW
MCHC: 35.3
MCV: 103.4 — ABNORMAL HIGH
RBC: 3.22 — ABNORMAL LOW
WBC: 6.1

## 2011-03-13 LAB — COMPREHENSIVE METABOLIC PANEL
AST: 28
BUN: 7
CO2: 22
Calcium: 8.8
Chloride: 112
Creatinine, Ser: 0.9
GFR calc non Af Amer: >60
Glucose, Bld: 119 — ABNORMAL HIGH
Total Bilirubin: 0.4

## 2011-03-13 LAB — DIFFERENTIAL
Basophils Absolute: 0
Eosinophils Relative: 2
Lymphocytes Relative: 41
Lymphs Abs: 2.5
Neutrophils Relative %: 49

## 2011-03-13 LAB — PROTIME-INR
INR: 1
Prothrombin Time: 13.3

## 2011-08-02 ENCOUNTER — Encounter (HOSPITAL_COMMUNITY): Payer: Self-pay | Admitting: *Deleted

## 2011-08-02 ENCOUNTER — Emergency Department (HOSPITAL_COMMUNITY)
Admission: EM | Admit: 2011-08-02 | Discharge: 2011-08-02 | Disposition: A | Discharge status: Home / Self Care | Payer: Self-pay | Attending: Emergency Medicine | Admitting: Emergency Medicine

## 2011-08-02 ENCOUNTER — Emergency Department (HOSPITAL_COMMUNITY): Payer: Self-pay

## 2011-08-02 DIAGNOSIS — K219 Gastro-esophageal reflux disease without esophagitis: Secondary | ICD-10-CM | POA: Insufficient documentation

## 2011-08-02 DIAGNOSIS — M25469 Effusion, unspecified knee: Secondary | ICD-10-CM | POA: Insufficient documentation

## 2011-08-02 DIAGNOSIS — F17200 Nicotine dependence, unspecified, uncomplicated: Secondary | ICD-10-CM | POA: Insufficient documentation

## 2011-08-02 DIAGNOSIS — M25569 Pain in unspecified knee: Secondary | ICD-10-CM | POA: Insufficient documentation

## 2011-08-02 DIAGNOSIS — W010XXA Fall on same level from slipping, tripping and stumbling without subsequent striking against object, initial encounter: Secondary | ICD-10-CM | POA: Insufficient documentation

## 2011-08-02 HISTORY — DX: Gastro-esophageal reflux disease without esophagitis: K21.9

## 2011-08-02 MED ORDER — OXYCODONE-ACETAMINOPHEN 5-325 MG PO TABS: 2.0000 | ORAL_TABLET | ORAL | Status: AC | PRN

## 2011-08-02 MED ORDER — IBUPROFEN 800 MG PO TABS: 800.0000 mg | ORAL_TABLET | Freq: Three times a day (TID) | ORAL | Status: AC

## 2011-08-02 MED ORDER — OXYCODONE-ACETAMINOPHEN 5-325 MG PO TABS
1.0000 | ORAL_TABLET | Freq: Once | ORAL | Status: AC
  Administered 2011-08-02: 1 via ORAL
  Filled 2011-08-02: qty 1

## 2011-08-02 NOTE — ED Notes (Signed)
Ortho paged for knee immbolizer

## 2011-08-02 NOTE — ED Notes (Signed)
Ortho at bedside.

## 2011-08-02 NOTE — ED Provider Notes (Signed)
Medical screening examination/treatment/procedure(s) were performed by non-physician practitioner and as supervising physician I was immediately available for consultation/collaboration.  Jarae Nemmers M Chanese Hartsough, MD 08/02/11 2150 

## 2011-08-02 NOTE — ED Notes (Signed)
Reports falling last night and now having right knee pain.

## 2011-08-02 NOTE — ED Provider Notes (Signed)
History     CSN: [id number]  Arrival date & time 08/02/11  0811   First MD Initiated Contact with Patient 08/02/11 0818      Chief Complaint  Patient presents with  . Knee Pain    (Consider location/radiation/quality/duration/timing/severity/associated sxs/prior treatment) Patient is a 56 y.o. female presenting with knee pain. The history is provided by the patient. No language interpreter was used.  Knee Pain This is a recurrent problem. The current episode started yesterday. The problem occurs constantly. The problem has been gradually worsening. Pertinent negatives include no chills, fever or vomiting. The symptoms are aggravated by drinking and walking. She has tried nothing for the symptoms.  56yo  female coming in today with right knee right tib-fib pain since last night when she tripped falling coming out of the cath. States she has had problems with his knee before and has a leg immobilizer at home but she did not put the immobilizer on or take any pain medicines. Patient smells of alcohol. No pulses below this site of injury. No deformity noted slightly edematous.    Past Medical History  Diagnosis Date  . Acid reflux     History reviewed. No pertinent past surgical history.  History reviewed. No pertinent family history.  History  Substance Use Topics  . Smoking status: Current Everyday Smoker -- 1.0 packs/day    Types: Cigarettes  . Smokeless tobacco: Not on file  . Alcohol Use: Yes     occ beer    OB History    Grav Para Term Preterm Abortions TAB SAB Ect Mult Living                  Review of Systems  Constitutional: Negative for fever and chills.  Gastrointestinal: Negative for vomiting.  All other systems reviewed and are negative.    Allergies  Penicillins  Home Medications   Current Outpatient Rx  Name Route Sig Dispense Refill  . ACETAMINOPHEN 500 MG PO TABS Oral Take 1,000 mg by mouth every 6 (six) hours as needed. For headache    .  ALBUTEROL SULFATE HFA 108 (90 BASE) MCG/ACT IN AERS Inhalation Inhale 2 puffs into the lungs every 6 (six) hours as needed. For shortness of breath    . FLUTICASONE PROPIONATE  HFA 220 MCG/ACT IN AERO Inhalation Inhale 1 puff into the lungs 2 (two) times daily.    . TRIAMCINOLONE ACETONIDE 55 MCG/ACT NA INHA Nasal Place 2 sprays into the nose daily.      BP 124/78  Pulse 84  Temp(Src) 97.6 F (36.4 C) (Oral)  Resp 18  SpO2 97%  Physical Exam  Nursing note and vitals reviewed. Constitutional: She is oriented to person, place, and time. She appears well-developed and well-nourished.  HENT:  Head: Normocephalic and atraumatic.  Eyes: Conjunctivae and EOM are normal. Pupils are equal, round, and reactive to light.  Neck: Normal range of motion. Neck supple.  Cardiovascular: Normal rate and regular rhythm.   Pulmonary/Chest: Effort normal and breath sounds normal.  Abdominal: Soft. Bowel sounds are normal.  Musculoskeletal: Normal range of motion. She exhibits tenderness. She exhibits no edema.       R knee tender with palpatation no deformity noted Good pedal pulse, and sensation.  Neurological: She is alert and oriented to person, place, and time. She has normal reflexes.  Skin: Skin is warm and dry.  Psychiatric: She has a normal mood and affect.    ED Course  Procedures (including critical care time)  Labs Reviewed - No data to display No results found.   No diagnosis found.    MDM   56year-old female with right knee pain since she tripped coming out of a cab Last night.  PMH of injury to the same knee with intrmittant pain.  Plain films show no fracture.  Knee immobilizer applied.  Patient has her own crutches.  Follow up with ortho as needed. Pain meds provided       Jethro Bastos, NP 08/02/11 1024  Jethro Bastos, NP 08/02/11 1025

## 2011-08-02 NOTE — Progress Notes (Signed)
Orthopedic Tech Progress Note Patient Details:  [name] 26-Apr-1956 [id number]  Other Ortho Devices Type of Ortho Device: Knee Immobilizer Ortho Device Location: right knee Ortho Device Interventions: Application   Gaye Pollack 08/02/2011, 10:34 AM

## 2011-08-02 NOTE — Discharge Instructions (Signed)
[name] films today shows no fracture to your knee or either lower leg. Use ice and elevate above the heart for the next 24 hours. Take ibuprofen 800 every 6 hours for pain with food. Use Percocet as needed for severe pain but do not drive with this medication. Followup with orthopedic listed. Where your knee immobilizer until you see the orthopedic physician.     Knee Pain Knee pain can be a result of an injury or other medical conditions. Treatment will depend on the cause of your pain. HOME CARE  Only take medicine as told by your doctor.   Keep a healthy weight. Being overweight can make the knee hurt more.   Stretch before exercising or playing sports.   If there is constant knee pain, change the way you exercise. Ask your doctor for advice.   Make sure shoes fit well. Choose the right shoe for the sport or activity.   Protect your knees. Wear kneepads if needed.   Rest when you are tired.  GET HELP RIGHT AWAY IF:   Your knee pain does not stop.   Your knee pain does not get better.   Your knee joint feels hot to the touch.   You have a temperature by mouth above 102 F (38.9 C), not controlled by medicine.   Your baby is older than 3 months with a rectal temperature of 102 F (38.9 C) or higher.   Your baby is 67 months old or younger with a rectal temperature of 100.4 F (38 C) or higher.  MAKE SURE YOU:   Understand these instructions.   Will watch this condition.   Will get help right away if you are not doing well or get worse.  Document Released: 08/29/2008 Document Revised: 02/12/2011 Document Reviewed: 08/29/2008 [facility] Patient Information 2012 [place], [place].Arthralgia Arthralgia is joint pain. A joint is a place where two bones meet. Joint pain can happen for many reasons. The joint can be bruised, stiff, infected, or weak from aging. Pain usually goes away after resting and taking medicine for soreness.  HOME CARE  Rest the joint as told by your  doctor.   Keep the sore joint raised (elevated) for the first 24 hours.   Put ice on the joint area.   Put ice in a plastic bag.   Place a towel between your skin and the bag   Leave the ice on for 15 to 20 minutes, 3 to 4 times a day.   Wear your splint, casting, elastic bandage, or sling as told by your doctor.   Only take medicine as told by your doctor. Do not take aspirin.   Use crutches as told by your doctor. Do not put weight on the joint until told to by your doctor.  GET HELP RIGHT AWAY IF:   You have bruising, puffiness (swelling), or more pain.   Your fingers or toes turn blue or start to lose feeling (numb).   Your medicine does not lessen the pain.   Your pain becomes severe.   You have a temperature by mouth above 102 F (38.9 C), not controlled by medicine.   You cannot move or use the joint.  MAKE SURE YOU:   Understand these instructions.   Will watch your condition.   Will get help right away if you are not doing well or get worse.  Document Released: 05/21/2009 Document Revised: 02/12/2011 Document Reviewed: 05/21/2009 [facility] Patient Information 2012 [place], [place].

## 2012-02-19 ENCOUNTER — Emergency Department (INDEPENDENT_AMBULATORY_CARE_PROVIDER_SITE_OTHER)
Admission: EM | Admit: 2012-02-19 | Discharge: 2012-02-19 | Disposition: A | Discharge status: Home / Self Care | Payer: Self-pay | Source: Home / Self Care | Attending: Emergency Medicine | Admitting: Emergency Medicine

## 2012-02-19 ENCOUNTER — Encounter (HOSPITAL_COMMUNITY): Payer: Self-pay | Admitting: Emergency Medicine

## 2012-02-19 DIAGNOSIS — Z760 Encounter for issue of repeat prescription: Secondary | ICD-10-CM

## 2012-02-19 DIAGNOSIS — W57XXXA Bitten or stung by nonvenomous insect and other nonvenomous arthropods, initial encounter: Secondary | ICD-10-CM

## 2012-02-19 HISTORY — DX: Other specified abnormal findings of blood chemistry: R79.89

## 2012-02-19 HISTORY — DX: Malignant (primary) neoplasm, unspecified: C80.1

## 2012-02-19 HISTORY — DX: Alcohol abuse, uncomplicated: F10.10

## 2012-02-19 HISTORY — DX: Other seasonal allergic rhinitis: J30.2

## 2012-02-19 HISTORY — DX: Chronic obstructive pulmonary disease, unspecified: J44.9

## 2012-02-19 HISTORY — DX: Unspecified asthma, uncomplicated: J45.909

## 2012-02-19 MED ORDER — HYDROXYZINE HCL 50 MG PO TABS: 50.0000 mg | ORAL_TABLET | Freq: Three times a day (TID) | ORAL | Status: DC | PRN

## 2012-02-19 MED ORDER — BACITRACIN-PRAMOXINE HCL 500-10 UNIT-MG/GM EX OINT: 1.0000 "application " | TOPICAL_OINTMENT | Freq: Two times a day (BID) | CUTANEOUS | Status: DC

## 2012-02-19 MED ORDER — FLUTICASONE PROPIONATE HFA 220 MCG/ACT IN AERO: 1.0000 | INHALATION_SPRAY | Freq: Two times a day (BID) | RESPIRATORY_TRACT | Status: DC

## 2012-02-19 MED ORDER — ALBUTEROL SULFATE HFA 108 (90 BASE) MCG/ACT IN AERS: 2.0000 | INHALATION_SPRAY | Freq: Four times a day (QID) | RESPIRATORY_TRACT | Status: DC | PRN

## 2012-02-19 MED ORDER — PERMETHRIN 5 % EX CREA: TOPICAL_CREAM | CUTANEOUS | Status: AC

## 2012-02-19 MED ORDER — PANTOPRAZOLE SODIUM 40 MG PO TBEC: 40.0000 mg | DELAYED_RELEASE_TABLET | Freq: Every day | ORAL | Status: DC

## 2012-02-19 MED ORDER — PREDNISONE 50 MG PO TABS: ORAL_TABLET | ORAL | Status: DC

## 2012-02-19 MED ORDER — TRIAMCINOLONE ACETONIDE 0.1 % EX CREA: TOPICAL_CREAM | Freq: Two times a day (BID) | CUTANEOUS | Status: AC

## 2012-02-19 MED ORDER — TRIAMCINOLONE ACETONIDE(NASAL) 55 MCG/ACT NA INHA: 2.0000 | Freq: Every day | NASAL | Status: DC

## 2012-02-19 NOTE — ED Notes (Signed)
Pt c/o of rash on back of neck,left forearm, and groin/vaginal area x 1 month. Itching and oozing of clear/sometimes green fluid. Pt c/o of stiffness of left side of neck and sharp pain under left breast. Hx left breast cancer with lumpectomy. Pt c/o HA's Pt request refill of medications but uncertain of names/dosages.

## 2012-02-19 NOTE — ED Provider Notes (Signed)
History     CSN: [id number]  Arrival date & time 02/19/12  1805   First MD Initiated Contact with Patient 02/19/12 1814      Chief Complaint  Patient presents with  . Rash    multiple problems    (Consider location/radiation/quality/duration/timing/severity/associated sxs/prior treatment) HPI Comments: Patient presents with 2 issues: First, she reports a migratory, itchy rash over the past month- states that it is itchy all day long. States it initially started in the back of her neck, but that it has since spread to her arms, genital region, lower legs. States that several of these regions have healed, but complains of a weeping, erythematous, area in the left antecubital fossa. States that she feels like she is being bitten when she lies on her sofa. Has not noted any blood on the sofa. Does not have similar symptoms when in her bed. States that she does not have any pets at home. States her grandchild gets similar lesions when the child comes to stay with her. No new lotions, soaps, detergents. No new medications.  No nausea, vomiting, fevers. No abdominal pain, vaginal discharge.  Second, patient is requesting refill of all of her medications, which per record review is Flovent, Nasacort, protonix, Ventolin. States she ran out of these several weeks ago. She is [facility], currently has no respiratory complaints.  ROS as noted in HPI. All other ROS negative.   Patient is a 56 y.o. female presenting with rash. The history is provided by the patient. No language interpreter was used.  Rash  This is a new problem. The current episode started more than 1 week ago. The problem has been gradually worsening. The problem is associated with an unknown factor. There has been no fever. The rash is present on the left arm, genitalia, left lower leg, neck and right arm. The patient is experiencing no pain. The pain has been fluctuating since onset. Associated symptoms include blisters,  itching and weeping. She has tried nothing for the symptoms. The treatment provided no relief.    Past Medical History  Diagnosis Date  . Acid reflux   . Asthma   . COPD (chronic obstructive pulmonary disease)   . Cancer     lumpectomy, radiation 2009  . Seasonal allergies   . Alcohol abuse   . Elevated d-dimer     negative chest CT    Past Surgical History  Procedure Date  . Breast lumpectomy   . Abdominal hysterectomy     Family History  Problem Relation Age of Onset  . Hypertension Other     History  Substance Use Topics  . Smoking status: Current Everyday Smoker -- 1.0 packs/day    Types: Cigarettes  . Smokeless tobacco: Not on file  . Alcohol Use: Yes     occ beer    OB History    Grav Para Term Preterm Abortions TAB SAB Ect Mult Living                  Review of Systems  Skin: Positive for itching and rash.    Allergies  Penicillins  Home Medications   Current Outpatient Rx  Name Route Sig Dispense Refill  . ALBUTEROL SULFATE HFA 108 (90 BASE) MCG/ACT IN AERS Inhalation Inhale 2 puffs into the lungs every 6 (six) hours as needed for wheezing or shortness of breath. For shortness of breath 1 Inhaler 2  . BACITRACIN-PRAMOXINE HCL 500-10 UNIT-MG/GM EX OINT Apply externally Apply 1 application  topically 2 (two) times daily. 28 g 0  . FLUTICASONE PROPIONATE  HFA 220 MCG/ACT IN AERO Inhalation Inhale 1 puff into the lungs 2 (two) times daily. 1 Inhaler 2  . HYDROXYZINE HCL 50 MG PO TABS Oral Take 1 tablet (50 mg total) by mouth 3 (three) times daily as needed for itching. 30 tablet 0  . PANTOPRAZOLE SODIUM 40 MG PO TBEC Oral Take 1 tablet (40 mg total) by mouth daily. 30 tablet 1  . PERMETHRIN 5 % EX CREA  Apply from chin down, leave on for 8-14 hours, rinse. Repeat in 1 week 60 g 0  . PREDNISONE 50 MG PO TABS  1 tablet po daily x 2 days, then 1/2 tablet once daily for 2 days 5 tablet 0  . TRIAMCINOLONE ACETONIDE 55 MCG/ACT NA INHA Nasal Place 2 sprays into  the nose daily. 1 Inhaler 2  . TRIAMCINOLONE ACETONIDE 0.1 % EX CREA Topical Apply topically 2 (two) times daily. Apply for 2 weeks. May use on face 30 g 0    BP 155/81  Pulse 65  Temp 98 F (36.7 C) (Oral)  Resp 16  SpO2 96%  Physical Exam  Nursing note and vitals reviewed. Constitutional: She is oriented to person, place, and time. She appears well-developed and well-nourished. No distress.  HENT:  Head: Normocephalic and atraumatic.  Eyes: Conjunctivae and EOM are normal.  Neck: Normal range of motion.  Cardiovascular: Normal rate.   Pulmonary/Chest: Effort normal.  Abdominal: She exhibits no distension.  Musculoskeletal: Normal range of motion.  Neurological: She is alert and oriented to person, place, and time. Coordination normal.  Skin: Skin is warm and dry. Rash noted. Rash is papular and pustular.       Scattered papules/pustules over arms, lower extremities. Large, erythematous, weeping region in left a.c. Fossa- appears to secondarily infected No rash on back, abdomen, genitals.  Psychiatric: She has a normal mood and affect. Her behavior is normal. Judgment and thought content normal.    ED Course  Procedures (including critical care time)  Labs Reviewed - No data to display No results found.   1. Bug bites   2. Medication refill      MDM  Previous records reviewed. Additional medical history, medications & dosages obtained. H/o COPD, alcoholism, reflux.  History of elevated dimer with negative chest CT. Last seen in ED in February for knee injury.  Triage notes reviewed. Patient did not mention neck pain, left breast pain, headaches to me.  Suspect that patient has an infestation of the couch, advised that she will need to get rid of it. Will send her home with some prednisone, Atarax, permethrin, although the rash appears to be more from bedbugs than scabies.. Home with bacitracin/pramoxine for the infected rash. Do not suspect MRSA, no signs of systemic  involvement, deferring systemic antibiotics. Also refilling all of her chronic medications. Provided her with a list of local resources for ongoing care. Discussed signs and symptoms that should prompt return to the department. Patient agrees with plan.  Luiz Blare, MD 02/19/12 2117

## 2012-02-25 ENCOUNTER — Emergency Department (HOSPITAL_COMMUNITY)
Admission: EM | Admit: 2012-02-25 | Discharge: 2012-02-25 | Disposition: A | Discharge status: Home Health | Payer: Self-pay | Attending: Emergency Medicine | Admitting: Emergency Medicine

## 2012-02-25 DIAGNOSIS — L0390 Cellulitis, unspecified: Secondary | ICD-10-CM

## 2012-02-25 DIAGNOSIS — L02219 Cutaneous abscess of trunk, unspecified: Secondary | ICD-10-CM | POA: Insufficient documentation

## 2012-02-25 DIAGNOSIS — K219 Gastro-esophageal reflux disease without esophagitis: Secondary | ICD-10-CM | POA: Insufficient documentation

## 2012-02-25 DIAGNOSIS — Z880 Allergy status to penicillin: Secondary | ICD-10-CM | POA: Insufficient documentation

## 2012-02-25 DIAGNOSIS — J45909 Unspecified asthma, uncomplicated: Secondary | ICD-10-CM | POA: Insufficient documentation

## 2012-02-25 DIAGNOSIS — A599 Trichomoniasis, unspecified: Secondary | ICD-10-CM

## 2012-02-25 DIAGNOSIS — R21 Rash and other nonspecific skin eruption: Secondary | ICD-10-CM

## 2012-02-25 DIAGNOSIS — Z853 Personal history of malignant neoplasm of breast: Secondary | ICD-10-CM | POA: Insufficient documentation

## 2012-02-25 DIAGNOSIS — Z8249 Family history of ischemic heart disease and other diseases of the circulatory system: Secondary | ICD-10-CM | POA: Insufficient documentation

## 2012-02-25 DIAGNOSIS — F17200 Nicotine dependence, unspecified, uncomplicated: Secondary | ICD-10-CM | POA: Insufficient documentation

## 2012-02-25 DIAGNOSIS — Z923 Personal history of irradiation: Secondary | ICD-10-CM | POA: Insufficient documentation

## 2012-02-25 LAB — CBC WITH DIFFERENTIAL/PLATELET
Basophils Absolute: 0 10*3/uL (ref 0.0–0.1)
Basophils Relative: 0 % (ref 0–1)
Eosinophils Absolute: 0.8 10*3/uL — ABNORMAL HIGH (ref 0.0–0.7)
MCH: 35.7 pg — ABNORMAL HIGH (ref 26.0–34.0)
MCHC: 34.7 g/dL (ref 30.0–36.0)
Neutro Abs: 3.7 10*3/uL (ref 1.7–7.7)
Neutrophils Relative %: 58 % (ref 43–77)
RDW: 12.3 % (ref 11.5–15.5)

## 2012-02-25 LAB — COMPREHENSIVE METABOLIC PANEL
AST: 19 U/L (ref 0–37)
Albumin: 4 g/dL (ref 3.5–5.2)
Alkaline Phosphatase: 93 U/L (ref 39–117)
Chloride: 104 mEq/L (ref 96–112)
Creatinine, Ser: 0.56 mg/dL (ref 0.50–1.10)
Potassium: 4.2 mEq/L (ref 3.5–5.1)
Total Bilirubin: 0.2 mg/dL — ABNORMAL LOW (ref 0.3–1.2)
Total Protein: 7.5 g/dL (ref 6.0–8.3)

## 2012-02-25 LAB — URINALYSIS, ROUTINE W REFLEX MICROSCOPIC
Glucose, UA: NEGATIVE mg/dL
Protein, ur: NEGATIVE mg/dL
pH: 5 (ref 5.0–8.0)

## 2012-02-25 LAB — URINE MICROSCOPIC-ADD ON

## 2012-02-25 MED ORDER — METRONIDAZOLE 500 MG PO TABS
2000.0000 mg | ORAL_TABLET | Freq: Once | ORAL | Status: AC
  Administered 2012-02-25: 2000 mg via ORAL
  Filled 2012-02-25: qty 4

## 2012-02-25 MED ORDER — DIPHENHYDRAMINE HCL 25 MG PO CAPS
25.0000 mg | ORAL_CAPSULE | Freq: Once | ORAL | Status: AC
  Administered 2012-02-25: 25 mg via ORAL
  Filled 2012-02-25: qty 1

## 2012-02-25 MED ORDER — HYDROCORTISONE 1 % EX CREA: TOPICAL_CREAM | CUTANEOUS | Status: AC

## 2012-02-25 MED ORDER — SULFAMETHOXAZOLE-TRIMETHOPRIM 800-160 MG PO TABS: 1.0000 | ORAL_TABLET | Freq: Two times a day (BID) | ORAL | Status: AC

## 2012-02-25 MED ORDER — PREDNISONE 50 MG PO TABS: ORAL_TABLET | ORAL | Status: AC

## 2012-02-25 MED ORDER — PREDNISONE 20 MG PO TABS
60.0000 mg | ORAL_TABLET | Freq: Once | ORAL | Status: AC
  Administered 2012-02-25: 60 mg via ORAL
  Filled 2012-02-25: qty 3

## 2012-02-25 MED ORDER — CEPHALEXIN 500 MG PO CAPS: 500.0000 mg | ORAL_CAPSULE | Freq: Four times a day (QID) | ORAL | Status: AC

## 2012-02-25 NOTE — ED Notes (Signed)
Seen at urgent care on Friday. Dx with bed bugs. Pt has rash all over entire body since last Thursday. C/o itching at all sites

## 2012-02-25 NOTE — ED Provider Notes (Signed)
History     CSN: [id number]  Arrival date & time 02/25/12  8295   First MD Initiated Contact with Patient 02/25/12 [phone]      Chief Complaint  Patient presents with  . Rash    (Consider location/radiation/quality/duration/timing/severity/associated sxs/prior treatment) HPI Comments: Patient presents with an itchy rash that is diffuse in her body. She's had it for the past 2 weeks. She was seen at urgent care on September 5 and told she may have had bed bugs or scabies. She's been using triamcinolone cream as well as permethrin without relief. She did not fill her steroids or antibiotics. The rash started the back of her neck and progressed to her bilateral extremities, trunk, genital region. It worsens her left a.c. fossa which is a large area of excoriation. Denies any fevers, chills, chest pain, shortness of breath. Any soaps, foods, lotions or detergents. No one else has a similar rash. She has no difficulty breathing or swallowing.  The history is provided by the patient.    Past Medical History  Diagnosis Date  . Acid reflux   . Asthma   . COPD (chronic obstructive pulmonary disease)   . Cancer     lumpectomy, radiation 2009  . Seasonal allergies   . Alcohol abuse   . Elevated d-dimer     negative chest CT    Past Surgical History  Procedure Date  . Breast lumpectomy   . Abdominal hysterectomy     Family History  Problem Relation Age of Onset  . Hypertension Other     History  Substance Use Topics  . Smoking status: Current Every Day Smoker -- 1.0 packs/day    Types: Cigarettes  . Smokeless tobacco: Not on file  . Alcohol Use: Yes     occ beer    OB History    Grav Para Term Preterm Abortions TAB SAB Ect Mult Living                  Review of Systems  Constitutional: Negative for fever, activity change and appetite change.  HENT: Negative for congestion and rhinorrhea.   Respiratory: Negative for cough, chest tightness and shortness of breath.     Cardiovascular: Negative for chest pain.  Gastrointestinal: Negative for nausea, vomiting and abdominal pain.  Genitourinary: Negative for dysuria.  Musculoskeletal: Negative for back pain.  Skin: Positive for rash and wound.  Neurological: Negative for dizziness and headaches.    Allergies  Penicillins  Home Medications   Current Outpatient Rx  Name Route Sig Dispense Refill  . ALBUTEROL SULFATE HFA 108 (90 BASE) MCG/ACT IN AERS Inhalation Inhale 2 puffs into the lungs every 6 (six) hours as needed for wheezing or shortness of breath. For shortness of breath 1 Inhaler 2  . BACITRACIN-PRAMOXINE HCL 500-10 UNIT-MG/GM EX OINT Apply externally Apply 1 application topically 2 (two) times daily. 28 g 0  . FLUTICASONE PROPIONATE  HFA 220 MCG/ACT IN AERO Inhalation Inhale 1 puff into the lungs 2 (two) times daily. 1 Inhaler 2  . PANTOPRAZOLE SODIUM 40 MG PO TBEC Oral Take 1 tablet (40 mg total) by mouth daily. 30 tablet 1  . TRIAMCINOLONE ACETONIDE 0.1 % EX CREA Topical Apply topically 2 (two) times daily. Apply for 2 weeks. May use on face 30 g 0  . CEPHALEXIN 500 MG PO CAPS Oral Take 1 capsule (500 mg total) by mouth 4 (four) times daily. 40 capsule 0  . HYDROCORTISONE 1 % EX CREA  Apply to affected  area 2 times daily 15 g 0  . PREDNISONE 50 MG PO TABS  1 tablet PO daily 5 tablet 0  . SULFAMETHOXAZOLE-TRIMETHOPRIM 800-160 MG PO TABS Oral Take 1 tablet by mouth every 12 (twelve) hours. 10 tablet 0    BP 160/86  Temp 98.4 F (36.9 C) (Oral)  Resp 24  Physical Exam  Constitutional: She is oriented to person, place, and time. She appears well-developed and well-nourished. No distress.  HENT:  Head: Normocephalic and atraumatic.  Mouth/Throat: Oropharynx is clear and moist. No oropharyngeal exudate.  Eyes: Conjunctivae normal are normal. Pupils are equal, round, and reactive to light.  Neck: Normal range of motion. Neck supple.  Cardiovascular: Normal rate, regular rhythm and normal  heart sounds.   No murmur heard. Pulmonary/Chest: Effort normal and breath sounds normal. No respiratory distress. She has no wheezes.  Abdominal: Soft. There is no tenderness. There is no rebound and no guarding.  Musculoskeletal: Normal range of motion. She exhibits no edema.  Neurological: She is alert and oriented to person, place, and time. No cranial nerve deficit.  Skin: Skin is warm. Rash noted.       Diffuse pruritic papules to arms, legs, neck, back areas of excoriation to neck and chest. Left antecubital fossa has a large area of excoriation with overlying erythema and weeping. No fluctuance, bleeding.    ED Course  Procedures (including critical care time)  Labs Reviewed  CBC WITH DIFFERENTIAL - Abnormal; Notable for the following:    MCV 102.8 (*)     MCH 35.7 (*)     Eosinophils Relative 12 (*)     Eosinophils Absolute 0.8 (*)     All other components within normal limits  COMPREHENSIVE METABOLIC PANEL - Abnormal; Notable for the following:    Total Bilirubin 0.2 (*)     All other components within normal limits  URINALYSIS, ROUTINE W REFLEX MICROSCOPIC - Abnormal; Notable for the following:    Hgb urine dipstick TRACE (*)     Leukocytes, UA MODERATE (*)     All other components within normal limits  URINE MICROSCOPIC-ADD ON - Abnormal; Notable for the following:    Squamous Epithelial / LPF FEW (*)     All other components within normal limits   No results found.   1. Rash   2. Cellulitis   3. Trichomonas       MDM  Pruritic body rash with excoriation. Consider bed bugs, flea bites, scabies, med reaction. No systemic symptoms, fever, shortness of breath, wheezing.  Suspect bites or other allergic exposure.  Patient states she has gotten rid of her couch as instructed. No mucus membrane involvement.   Will add bactrim/keflex for L arm cellulitis, no sign of abscess.  Patient did no fill PO steroids and used premethrin incorrectly.  Fill steroids, use  antihistamines, recheck at urgent care in 2 days.        Glynn Octave, MD 02/25/12 [phone]

## 2012-03-10 ENCOUNTER — Encounter (HOSPITAL_COMMUNITY): Payer: Self-pay

## 2012-03-10 ENCOUNTER — Emergency Department (INDEPENDENT_AMBULATORY_CARE_PROVIDER_SITE_OTHER)
Admission: EM | Admit: 2012-03-10 | Discharge: 2012-03-10 | Disposition: A | Discharge status: Home / Self Care | Payer: Self-pay | Source: Home / Self Care

## 2012-03-10 DIAGNOSIS — L259 Unspecified contact dermatitis, unspecified cause: Secondary | ICD-10-CM

## 2012-03-10 MED ORDER — HYDROXYZINE HCL 25 MG PO TABS: 25.0000 mg | ORAL_TABLET | Freq: Four times a day (QID) | ORAL | Status: DC | PRN

## 2012-03-10 MED ORDER — METHYLPREDNISOLONE 4 MG PO KIT: PACK | ORAL | Status: DC

## 2012-03-10 MED ORDER — CLOBETASOL PROP EMOLLIENT BASE 0.05 % EX CREA: 1.0000 "application " | TOPICAL_CREAM | Freq: Two times a day (BID) | CUTANEOUS | Status: DC

## 2012-03-10 NOTE — ED Notes (Signed)
C/o rash all over body, was seen on 9/5 ,  9/12 and is no better, she is out of medication and needs refills or new meds

## 2012-03-10 NOTE — ED Provider Notes (Signed)
History     CSN: [id number]  Arrival date & time 03/10/12  0920   None     Chief Complaint  Patient presents with  . Rash    (Consider location/radiation/quality/duration/timing/severity/associated sxs/prior treatment) HPI Comments: This is a third visit for this 56 year old female with generalized maculopapular rash. He been treated with prednisone triamcinolone and antibiotics however she's obtain essentially no relief. She does admit while on the prednisone there was partial relief of itching. There is diffuse maculopapular rash covering most body surface areas particularly the upper extremities and the inner thighs. The left antecubital fossa continues to be a high at its area for the rash although there is no sign of infection. See she is scratching most of the time due to the intense itching.  Patient is a 56 y.o. female presenting with rash.  Rash     Past Medical History  Diagnosis Date  . Acid reflux   . Asthma   . COPD (chronic obstructive pulmonary disease)   . Cancer     lumpectomy, radiation 2009  . Seasonal allergies   . Alcohol abuse   . Elevated d-dimer     negative chest CT    Past Surgical History  Procedure Date  . Breast lumpectomy   . Abdominal hysterectomy     Family History  Problem Relation Age of Onset  . Hypertension Other     History  Substance Use Topics  . Smoking status: Current Every Day Smoker -- 1.0 packs/day    Types: Cigarettes  . Smokeless tobacco: Not on file  . Alcohol Use: Yes     occ beer    OB History    Grav Para Term Preterm Abortions TAB SAB Ect Mult Living                  Review of Systems  Constitutional: Negative for fever, chills and activity change.  HENT: Negative.   Respiratory: Negative.   Cardiovascular: Negative.   Musculoskeletal: Negative.   Skin: Positive for rash. Negative for color change and pallor.  Neurological: Negative.     Allergies  Penicillins  Home Medications   Current  Outpatient Rx  Name Route Sig Dispense Refill  . ALBUTEROL SULFATE HFA 108 (90 BASE) MCG/ACT IN AERS Inhalation Inhale 2 puffs into the lungs every 6 (six) hours as needed for wheezing or shortness of breath. For shortness of breath 1 Inhaler 2  . FLUTICASONE PROPIONATE  HFA 220 MCG/ACT IN AERO Inhalation Inhale 1 puff into the lungs 2 (two) times daily. 1 Inhaler 2  . PANTOPRAZOLE SODIUM 40 MG PO TBEC Oral Take 1 tablet (40 mg total) by mouth daily. 30 tablet 1  . BACITRACIN-PRAMOXINE HCL 500-10 UNIT-MG/GM EX OINT Apply externally Apply 1 application topically 2 (two) times daily. 28 g 0  . CLOBETASOL PROP EMOLLIENT BASE 0.05 % EX CREA Topical Apply 1 application topically 2 (two) times daily. until symptoms resolve. Use scalp applicator 30 g 0  . HYDROCORTISONE 1 % EX CREA  Apply to affected area 2 times daily 15 g 0  . HYDROXYZINE HCL 25 MG PO TABS Oral Take 1 tablet (25 mg total) by mouth every 6 (six) hours as needed for itching. 20 tablet 0  . METHYLPREDNISOLONE 4 MG PO KIT  As directed 21 tablet 0  . TRIAMCINOLONE ACETONIDE 0.1 % EX CREA Topical Apply topically 2 (two) times daily. Apply for 2 weeks. May use on face 30 g 0    BP 119/73  Pulse 71  Temp 99 F (37.2 C) (Oral)  Resp 16  SpO2 100%  Physical Exam  Constitutional: She is oriented to person, place, and time. She appears well-developed and well-nourished.  Eyes: EOM are normal. Pupils are equal, round, and reactive to light.  Neck: Normal range of motion. Neck supple.  Genitourinary:       : few macular papular pruritic lesions in the perineumand external vulva.  Musculoskeletal: Normal range of motion. She exhibits no edema.  Neurological: She is alert and oriented to person, place, and time.  Skin: Skin is warm and dry. Rash noted. No erythema.    ED Course  Procedures (including critical care time)  Labs Reviewed - No data to display No results found.   1. Contact dermatitis       MDM  She's had this rash  for well over a month and doesn't seem to be improving much. I think it is  time for her to see a dermatologist,I will give her the number for that referral. Stop triamcinolone start clobetasol topical emollient twice a day to affected areas. Medrol dose pack Atarax 25 mg 3 times a day when necessary itching.        Hayden Rasmussen, NP 03/10/12 1040

## 2012-03-10 NOTE — ED Provider Notes (Signed)
Medical screening examination/treatment/procedure(s) were performed by non-physician practitioner and as supervising physician I was immediately available for consultation/collaboration.  Raynald Blend, MD 03/10/12 1050

## 2012-10-23 ENCOUNTER — Emergency Department (INDEPENDENT_AMBULATORY_CARE_PROVIDER_SITE_OTHER)
Admission: EM | Admit: 2012-10-23 | Discharge: 2012-10-23 | Disposition: A | Discharge status: Home / Self Care | Payer: Self-pay | Source: Home / Self Care | Attending: Emergency Medicine | Admitting: Emergency Medicine

## 2012-10-23 ENCOUNTER — Encounter (HOSPITAL_COMMUNITY): Payer: Self-pay | Admitting: Emergency Medicine

## 2012-10-23 DIAGNOSIS — L309 Dermatitis, unspecified: Secondary | ICD-10-CM

## 2012-10-23 DIAGNOSIS — L259 Unspecified contact dermatitis, unspecified cause: Secondary | ICD-10-CM

## 2012-10-23 MED ORDER — METHYLPREDNISOLONE ACETATE 80 MG/ML IJ SUSP
INTRAMUSCULAR | Status: AC
  Filled 2012-10-23: qty 1

## 2012-10-23 MED ORDER — PREDNISONE 10 MG PO TABS: ORAL_TABLET | ORAL | Status: DC

## 2012-10-23 MED ORDER — HYDROCODONE-ACETAMINOPHEN 5-325 MG PO TABS: ORAL_TABLET | ORAL | Status: DC

## 2012-10-23 MED ORDER — ERYTHROMYCIN BASE 333 MG PO TBEC: 333.0000 mg | DELAYED_RELEASE_TABLET | Freq: Three times a day (TID) | ORAL | Status: DC

## 2012-10-23 MED ORDER — METHYLPREDNISOLONE ACETATE 80 MG/ML IJ SUSP
80.0000 mg | Freq: Once | INTRAMUSCULAR | Status: AC
  Administered 2012-10-23: 80 mg via INTRAMUSCULAR

## 2012-10-23 MED ORDER — HYDROXYZINE HCL 25 MG PO TABS: 25.0000 mg | ORAL_TABLET | Freq: Four times a day (QID) | ORAL | Status: DC

## 2012-10-23 MED ORDER — HYDROCODONE-ACETAMINOPHEN 5-325 MG PO TABS
ORAL_TABLET | ORAL | Status: AC
  Filled 2012-10-23: qty 1

## 2012-10-23 MED ORDER — TRIAMCINOLONE ACETONIDE 0.1 % EX CREA: TOPICAL_CREAM | Freq: Three times a day (TID) | CUTANEOUS | Status: DC

## 2012-10-23 MED ORDER — HYDROCODONE-ACETAMINOPHEN 5-325 MG PO TABS
1.0000 | ORAL_TABLET | Freq: Once | ORAL | Status: AC
  Administered 2012-10-23: 1 via ORAL

## 2012-10-23 NOTE — ED Notes (Signed)
Left arm wrapped w/tefla and kerlix

## 2012-10-23 NOTE — ED Provider Notes (Signed)
Chief Complaint:   Chief Complaint  Patient presents with  . Rash    History of Present Illness:   [name] is a 57 year old female who's had a one-month history of a rash. This began in the antecubital fossa is bilaterally. It's worse on the right than the left right now. It's also behind both ears, on her feet, and her chest. The rash itches, burns, it hurts. She cannot think of any allergen that she's come in contact with including changes in soaps, detergents, washing powders, dryer sheet, or fabric softener. No exposure to cosmetics or perfumes, new foods or medications. She had a similar rash about a year ago. She was given some antibiotics and improved. She denies any history of eczema, allergies, or asthma. 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  Review of Systems:  Other than noted above, the patient denies any of the following symptoms: Systemic:  No fever, chills, sweats, weight loss, or fatigue. ENT:  No nasal congestion, rhinorrhea, sore throat, swelling of lips, tongue or throat. Resp:  No cough, wheezing, or shortness of breath. Skin:  No rash, itching, nodules, or suspicious lesions.  PMFSH:  Past medical history, family history, social history, meds, and allergies were reviewed. She has COPD, emphysema, and reflux. Penicillin caused her to break out in a rash. She is on albuterol, Flovent, and Protonix.  Physical Exam:   Vital signs:  BP 138/71  Pulse 66  Temp(Src) 97.9 F (36.6 C) (Oral)  Resp 16  SpO2 100% Gen:  Alert, oriented, in no distress. ENT:  Pharynx clear, no intraoral lesions, moist mucous membranes. Lungs:  Clear to auscultation. Skin:  She has a very severe, erosive a rash in the right antecubital fossa. This extends about halfway up the upper arm, and halfway down the forearm. There is erosion of the skin but no exudate or drainage. She has a milder rash in the left antecubital fossa, some rash behind her ear is, on her ankles, and on her left chest. Skin  was otherwise clear.  Course in Urgent Care Center:   She was given Depo-Medrol 80 mg IM, Norco 5/325 for the pain, and the arm was wrapped in a Kerlix dressing.  Assessment:  The encounter diagnosis was Eczema.  This has the appearance of eczema. There is nothing to suggest contact dermatitis. Apparently she has had a similar rash in the past.  Plan:   1.  The following meds were prescribed:   Discharge Medication List as of 10/23/2012  1:44 PM    START taking these medications   Details  erythromycin (ERY-TAB) 333 MG EC tablet Take 1 tablet (333 mg total) by mouth 3 (three) times daily., Starting 10/23/2012, Until Discontinued, Normal    HYDROcodone-acetaminophen (NORCO/VICODIN) 5-325 MG per tablet 1 to 2 tabs every 4 to 6 hours as needed for pain., Print    !! hydrOXYzine (ATARAX/VISTARIL) 25 MG tablet Take 1 tablet (25 mg total) by mouth every 6 (six) hours., Starting 10/23/2012, Until Discontinued, Normal    predniSONE (DELTASONE) 10 MG tablet Take 4 tabs daily for 4 days, 3 tabs daily for 4 days, 2 tabs daily for 4 days, then 1 tab daily for 4 days., Normal    !! triamcinolone cream (KENALOG) 0.1 % Apply topically 3 (three) times daily., Starting 10/23/2012, Until Discontinued, Normal     !! - Potential duplicate medications found. Please discuss with provider.     2.  The patient was instructed in symptomatic care and handouts were given.  3.  The patient was told to return if becoming worse in any way, if no better in 7 days, and given some red flag symptoms such as fever, or worsening pain that would indicate earlier return. 4.  Follow up in one week if no improvement.     Reuben Likes, MD 10/23/12 [phone]

## 2012-10-23 NOTE — ED Notes (Addendum)
Pt c/o rash on body onset 2 months; rash on left arm looks infected Sx include: localized fever Denies: v/n/d; no hx of eczema Has appt w/PCP next month; seen here last year for the same reason and dx w/contact dermatitis  She is alert and oriented wn/o signs of acute distress.

## 2012-11-03 ENCOUNTER — Ambulatory Visit: Payer: No Typology Code available for payment source | Attending: Internal Medicine | Admitting: Internal Medicine

## 2012-11-03 ENCOUNTER — Encounter: Payer: Self-pay | Admitting: Internal Medicine

## 2012-11-03 VITALS — BP 172/73 | HR 66 | Temp 99.0°F | Resp 14 | Ht 60.5 in | Wt 109.0 lb

## 2012-11-03 DIAGNOSIS — Z1239 Encounter for other screening for malignant neoplasm of breast: Secondary | ICD-10-CM

## 2012-11-03 DIAGNOSIS — R229 Localized swelling, mass and lump, unspecified: Secondary | ICD-10-CM | POA: Insufficient documentation

## 2012-11-03 DIAGNOSIS — Z1231 Encounter for screening mammogram for malignant neoplasm of breast: Secondary | ICD-10-CM

## 2012-11-03 MED ORDER — DOXYCYCLINE HYCLATE 100 MG PO TABS: 100.0000 mg | ORAL_TABLET | Freq: Two times a day (BID) | ORAL | Status: DC

## 2012-11-03 MED ORDER — HYDROCODONE-ACETAMINOPHEN 5-325 MG PO TABS: ORAL_TABLET | ORAL | Status: DC

## 2012-11-03 NOTE — Progress Notes (Signed)
Patient ID: [name], female   DOB: December 16, 1955, 57 y.o.   MRN: [id number]   CC: body rash and nodule under the left armpit  HPI: Pt is 57 yo female who presents to clinic with diffuse body rash and explains she was started on Prednisone and was diagnosed with eczema but her symptoms have not improved, associated with itching but no blisters or bleeding noted. Pt denies any specific allergies except to PCN, no food allergies and no similar events in the past. In addition, pt noticed small nodule under the left armpit but is unsure of the duration, non tender, non getting bigger and with no drainage noted. She denies fevers, chills, no abdominal or urinary concerns, no other systemic symptoms.    Allergies  Allergen Reactions  . Penicillins Other (See Comments)    Reaction a long time ago   Past Medical History  Diagnosis Date  . Acid reflux   . Asthma   . COPD (chronic obstructive pulmonary disease)   . Cancer     lumpectomy, radiation 2009  . Seasonal allergies   . Alcohol abuse   . Elevated d-dimer     negative chest CT   Current Outpatient Prescriptions on File Prior to Visit  Medication Sig Dispense Refill  . erythromycin (ERY-TAB) 333 MG EC tablet Take 1 tablet (333 mg total) by mouth 3 (three) times daily.  30 tablet  0  . hydrOXYzine (ATARAX/VISTARIL) 25 MG tablet Take 1 tablet (25 mg total) by mouth every 6 (six) hours as needed for itching.  20 tablet  0  . predniSONE (DELTASONE) 10 MG tablet Take 4 tabs daily for 4 days, 3 tabs daily for 4 days, 2 tabs daily for 4 days, then 1 tab daily for 4 days.  40 tablet  0  . triamcinolone cream (KENALOG) 0.1 % Apply topically 2 (two) times daily. Apply for 2 weeks. May use on face  30 g  0  . albuterol (PROVENTIL HFA;VENTOLIN HFA) 108 (90 BASE) MCG/ACT inhaler Inhale 2 puffs into the lungs every 6 (six) hours as needed for wheezing or shortness of breath. For shortness of breath  1 Inhaler  2  . Bacitracin-Pramoxine HCl 500-10  UNIT-MG/GM OINT Apply 1 application topically 2 (two) times daily.  28 g  0  . Clobetasol Prop Emollient Base 0.05 % emollient cream Apply 1 application topically 2 (two) times daily. until symptoms resolve. Use scalp applicator  30 g  0  . fluticasone (FLOVENT HFA) 220 MCG/ACT inhaler Inhale 1 puff into the lungs 2 (two) times daily.  1 Inhaler  2  . hydrocortisone cream 1 % Apply to affected area 2 times daily  15 g  0  . hydrOXYzine (ATARAX/VISTARIL) 25 MG tablet Take 1 tablet (25 mg total) by mouth every 6 (six) hours.  30 tablet  0  . methylPREDNISolone (MEDROL DOSEPAK) 4 MG tablet As directed  21 tablet  0  . pantoprazole (PROTONIX) 40 MG tablet Take 1 tablet (40 mg total) by mouth daily.  30 tablet  1  . triamcinolone cream (KENALOG) 0.1 % Apply topically 3 (three) times daily.  454 g  2   No current facility-administered medications on file prior to visit.   Family History  Problem Relation Age of Onset  . Hypertension Other    History   Social History  . Marital Status: Legally Separated    Spouse Name: N/A    Number of Children: N/A  . Years of Education: N/A  Occupational History  . Not on file.   Social History Main Topics  . Smoking status: Current Every Day Smoker -- 1.00 packs/day    Types: Cigarettes  . Smokeless tobacco: Not on file  . Alcohol Use: Yes     Comment: occ beer  . Drug Use: No  . Sexually Active: Yes    Birth Control/ Protection: Surgical   Other Topics Concern  . Not on file   Social History Narrative  . No narrative on file    Review of Systems  Constitutional: Negative for fever, chills, diaphoresis, activity change, appetite change and fatigue.  HENT: Negative for ear pain, nosebleeds, congestion, facial swelling, rhinorrhea, neck pain, neck stiffness and ear discharge.   Eyes: Negative for pain, discharge, redness, itching and visual disturbance.  Respiratory: Negative for cough, choking, chest tightness, shortness of breath, wheezing  and stridor.   Cardiovascular: Negative for chest pain, palpitations and leg swelling.  Gastrointestinal: Negative for abdominal distention.  Genitourinary: Negative for dysuria, urgency, frequency, hematuria, flank pain, decreased urine volume, difficulty urinating and dyspareunia.  Musculoskeletal: Negative for back pain, joint swelling, arthralgias and gait problem.  Neurological: Negative for dizziness, tremors, seizures, syncope, facial asymmetry, speech difficulty, weakness, light-headedness, numbness and headaches.  Hematological: Does not bruise/bleed easily.  Psychiatric/Behavioral: Negative for hallucinations, behavioral problems, confusion, dysphoric mood, decreased concentration and agitation.    Objective:   Filed Vitals:   11/03/12 1343  BP: 172/73  Pulse: 66  Temp: 99 F (37.2 C)  Resp: 14    Physical Exam  Constitutional: Appears well-developed and well-nourished. No distress.  HENT: Normocephalic. External right and left ear normal. Oropharynx is clear and moist.  Eyes: Conjunctivae and EOM are normal. PERRLA, no scleral icterus.  Neck: Normal ROM. Neck supple. No JVD. No tracheal deviation. No thyromegaly.  CVS: RRR, S1/S2 +, no murmurs, no gallops, no carotid bruit.  Pulmonary: Effort and breath sounds normal, no stridor, rhonchi, wheezes, rales.  Abdominal: Soft. BS +,  no distension, tenderness, rebound or guarding.  Musculoskeletal: Normal range of motion. No edema and no tenderness.  Lymphadenopathy: Small 1 cm round lump noted under the left armpit, non movable and non tender to touch, no erythema and no drainage noted  Neuro: Alert. Normal reflexes, muscle tone coordination. No cranial nerve deficit. Skin: Diffuse body macular rash with area of excoriations, no blisters Psychiatric: Normal mood and affect. Behavior, judgment, thought content normal.   Lab Results  Component Value Date   WBC 6.4 02/25/2012   HGB 14.0 02/25/2012   HCT 40.3 02/25/2012   MCV  102.8* 02/25/2012   PLT 221 02/25/2012   Lab Results  Component Value Date   CREATININE 0.56 02/25/2012   BUN 7 02/25/2012   NA 137 02/25/2012   K 4.2 02/25/2012   CL 104 02/25/2012   CO2 20 02/25/2012    No results found for this basename: HGBA1C   Lipid Panel     Component Value Date/Time   CHOL 149 09/25/2009 2133   TRIG 68 09/25/2009 2133   HDL 46 09/25/2009 2133   CHOLHDL 3.2 Ratio 09/25/2009 2133   VLDL 14 09/25/2009 2133   LDLCALC 89 09/25/2009 2133       Assessment and plan:   Left armpit lump - appears to be boil rather than lymph node enlargement - will prescribe 1 week therapy with doxycycline, place warm compresses under  The armpit and come back in 1 week for re evaluation - since pt has history of  breast cancer will place order for mammogram  Eczema - not improving - dermatology referral

## 2012-11-03 NOTE — Patient Instructions (Signed)

## 2012-11-10 ENCOUNTER — Telehealth: Payer: Self-pay

## 2012-11-10 NOTE — Telephone Encounter (Signed)
Pt has question regarding to referral. Could not understand on voicemail where she was referred to, but she would like a call back.  Thanks!

## 2012-11-12 ENCOUNTER — Encounter (HOSPITAL_COMMUNITY): Payer: Self-pay | Admitting: Emergency Medicine

## 2012-11-12 ENCOUNTER — Emergency Department (HOSPITAL_COMMUNITY)
Admission: EM | Admit: 2012-11-12 | Discharge: 2012-11-12 | Disposition: A | Discharge status: Home / Self Care | Payer: No Typology Code available for payment source | Source: Home / Self Care | Attending: Family Medicine | Admitting: Family Medicine

## 2012-11-12 DIAGNOSIS — L98499 Non-pressure chronic ulcer of skin of other sites with unspecified severity: Secondary | ICD-10-CM

## 2012-11-12 DIAGNOSIS — R21 Rash and other nonspecific skin eruption: Secondary | ICD-10-CM

## 2012-11-12 MED ORDER — CEPHALEXIN 500 MG PO CAPS: 500.0000 mg | ORAL_CAPSULE | Freq: Two times a day (BID) | ORAL | Status: DC

## 2012-11-12 MED ORDER — PREDNISONE 10 MG PO TABS: ORAL_TABLET | ORAL | Status: DC

## 2012-11-12 MED ORDER — SILVER SULFADIAZINE 1 % EX CREA: TOPICAL_CREAM | Freq: Two times a day (BID) | CUTANEOUS | Status: DC

## 2012-11-12 MED ORDER — LORATADINE 10 MG PO TABS: 10.0000 mg | ORAL_TABLET | Freq: Every day | ORAL | Status: DC

## 2012-11-12 MED ORDER — TRIAMCINOLONE ACETONIDE 0.1 % EX CREA: TOPICAL_CREAM | Freq: Two times a day (BID) | CUTANEOUS | Status: DC

## 2012-11-12 NOTE — ED Provider Notes (Signed)
Medical screening examination/treatment/procedure(s) were performed by non-physician practitioner and as supervising physician I was immediately available for consultation/collaboration.  Leslee Home, M.D.   Reuben Likes, MD 11/12/12 [phone]

## 2012-11-12 NOTE — ED Notes (Signed)
Pt is currently being seen for rash and is on medication.   Pt is c/o of an open sore that started off as a pustule on the left forearm.  Pt is schedule June 12 with dermatologist. Pt wanted area treated and to make sure there is no infection

## 2012-11-12 NOTE — ED Provider Notes (Signed)
History     CSN: [id number]  Arrival date & time 11/12/12  1718   First MD Initiated Contact with Patient 11/12/12 1834      Chief Complaint  Patient presents with  . Rash    open sore on left forearm.     (Consider location/radiation/quality/duration/timing/severity/associated sxs/prior treatment) HPI Comments: Pt presents for eval of rash, wanting to make sure it is not infected.  She has been seen for this a few times and the severity of the rash has been getting better but the distribution has been spreading.  At her original presentation, it was only in the bilateral antecubital fossa, now it is from the backs of the hands, up the arms, and around the shoulders.  She was being treated for an abscess in the left axilla with doxycycline which is getting better, but now she has had a spot on her skin open up on the left distal forearm and she wants to make sure that is not infected.  She has been using all the meds as prescribed.  She has an appt with derm scheduled for June 12.    Patient is a 57 y.o. female presenting with rash.  Rash Associated symptoms: no chest pain, no chills, no cough, no dysuria, no fever, no nausea, no shortness of breath and no vomiting     Past Medical History  Diagnosis Date  . Acid reflux   . Asthma   . COPD (chronic obstructive pulmonary disease)   . Cancer     lumpectomy, radiation 2009  . Seasonal allergies   . Alcohol abuse   . Elevated d-dimer     negative chest CT    Past Surgical History  Procedure Laterality Date  . Breast lumpectomy    . Abdominal hysterectomy      Family History  Problem Relation Age of Onset  . Hypertension Other     History  Substance Use Topics  . Smoking status: Current Every Day Smoker -- 1.00 packs/day    Types: Cigarettes  . Smokeless tobacco: Not on file  . Alcohol Use: Yes     Comment: occ beer    OB History   Grav Para Term Preterm Abortions TAB SAB Ect Mult Living                   Review of Systems  Constitutional: Negative for fever and chills.  Eyes: Negative for visual disturbance.  Respiratory: Negative for cough and shortness of breath.   Cardiovascular: Negative for chest pain, palpitations and leg swelling.  Gastrointestinal: Negative for nausea, vomiting and abdominal pain.  Endocrine: Negative for polydipsia and polyuria.  Genitourinary: Negative for dysuria, urgency and frequency.  Musculoskeletal: Negative for myalgias and arthralgias.  Skin: Positive for rash (see HPI).  Neurological: Negative for dizziness, weakness and light-headedness.    Allergies  Penicillins  Home Medications   Current Outpatient Rx  Name  Route  Sig  Dispense  Refill  . Bacitracin-Pramoxine HCl 500-10 UNIT-MG/GM OINT   Apply externally   Apply 1 application topically 2 (two) times daily.   28 g   0   . doxycycline (VIBRA-TABS) 100 MG tablet   Oral   Take 1 tablet (100 mg total) by mouth 2 (two) times daily.   14 tablet   0   . fluticasone (FLOVENT HFA) 220 MCG/ACT inhaler   Inhalation   Inhale 1 puff into the lungs 2 (two) times daily.   1 Inhaler   2   .  HYDROcodone-acetaminophen (NORCO/VICODIN) 5-325 MG per tablet      1 to 2 tabs every 4 to 6 hours as needed for pain.   65 tablet   0   . hydrocortisone cream 1 %      Apply to affected area 2 times daily   15 g   0   . hydrOXYzine (ATARAX/VISTARIL) 25 MG tablet   Oral   Take 1 tablet (25 mg total) by mouth every 6 (six) hours as needed for itching.   20 tablet   0   . pantoprazole (PROTONIX) 40 MG tablet   Oral   Take 1 tablet (40 mg total) by mouth daily.   30 tablet   1   . triamcinolone cream (KENALOG) 0.1 %   Topical   Apply topically 3 (three) times daily.   454 g   2   . albuterol (PROVENTIL HFA;VENTOLIN HFA) 108 (90 BASE) MCG/ACT inhaler   Inhalation   Inhale 2 puffs into the lungs every 6 (six) hours as needed for wheezing or shortness of breath. For shortness of breath    1 Inhaler   2   . cephALEXin (KEFLEX) 500 MG capsule   Oral   Take 1 capsule (500 mg total) by mouth 2 (two) times daily.   28 capsule   0   . Clobetasol Prop Emollient Base 0.05 % emollient cream   Topical   Apply 1 application topically 2 (two) times daily. until symptoms resolve. Use scalp applicator   30 g   0   . erythromycin (ERY-TAB) 333 MG EC tablet   Oral   Take 1 tablet (333 mg total) by mouth 3 (three) times daily.   30 tablet   0   . hydrOXYzine (ATARAX/VISTARIL) 25 MG tablet   Oral   Take 1 tablet (25 mg total) by mouth every 6 (six) hours.   30 tablet   0   . loratadine (CLARITIN) 10 MG tablet   Oral   Take 1 tablet (10 mg total) by mouth daily.   30 tablet   1   . methylPREDNISolone (MEDROL DOSEPAK) 4 MG tablet      As directed   21 tablet   0   . predniSONE (DELTASONE) 10 MG tablet      Take 4 tabs daily for 4 days, 3 tabs daily for 4 days, 2 tabs daily for 4 days, then 1 tab daily for 4 days.   40 tablet   0   . predniSONE (DELTASONE) 10 MG tablet      4 tabs daily for 5 days, 3 tabs daily for 4 days, 2 tabs daily for 3 days, 1 tab daily for 2 days   40 tablet   0   . silver sulfADIAZINE (SILVADENE) 1 % cream   Topical   Apply topically 2 (two) times daily.   50 g   1   . triamcinolone cream (KENALOG) 0.1 %   Topical   Apply topically 2 (two) times daily. Apply for 2 weeks. May use on face   30 g   0   . triamcinolone cream (KENALOG) 0.1 %   Topical   Apply topically 2 (two) times daily.   80 g   0     There were no vitals taken for this visit.  Physical Exam  Nursing note and vitals reviewed. Constitutional: She is oriented to person, place, and time. Vital signs are normal. She appears well-developed and well-nourished. No distress.  HENT:  Head: Atraumatic.  Eyes: EOM are normal. Pupils are equal, round, and reactive to light.  Cardiovascular: Normal rate, regular rhythm and normal heart sounds.  Exam reveals no gallop  and no friction rub.   No murmur heard. Pulmonary/Chest: Effort normal and breath sounds normal. No respiratory distress. She has no wheezes. She has no rales.  Abdominal: Soft. There is no tenderness.  Neurological: She is alert and oriented to person, place, and time. She has normal strength.  Skin: Skin is warm and dry. Rash noted. Rash is maculopapular (lichenified, exscoriated rash on bilateral lateral arms and shoulders ). She is not diaphoretic.     Psychiatric: She has a normal mood and affect. Her behavior is normal. Judgment normal.    ED Course  Procedures (including critical care time)  Labs Reviewed  WOUND CULTURE   No results found.   1. Rash   2. Arm ulcer, with unspecified severity       MDM  For the time being, will do our best to keep this from getting infected and try to treat symptomatically to last her until she gets in to see the dermatologist.  Keep ulcer clean, covered, dressed with the silvadene cream.  She may come back here at any time if she needs to but otherwise just keep appt with derm.   Meds ordered this encounter  Medications  . silver sulfADIAZINE (SILVADENE) 1 % cream    Sig: Apply topically 2 (two) times daily.    Dispense:  50 g    Refill:  1  . loratadine (CLARITIN) 10 MG tablet    Sig: Take 1 tablet (10 mg total) by mouth daily.    Dispense:  30 tablet    Refill:  1  . predniSONE (DELTASONE) 10 MG tablet    Sig: 4 tabs daily for 5 days, 3 tabs daily for 4 days, 2 tabs daily for 3 days, 1 tab daily for 2 days    Dispense:  40 tablet    Refill:  0  . cephALEXin (KEFLEX) 500 MG capsule    Sig: Take 1 capsule (500 mg total) by mouth 2 (two) times daily.    Dispense:  28 capsule    Refill:  0  . triamcinolone cream (KENALOG) 0.1 %    Sig: Apply topically 2 (two) times daily.    Dispense:  80 g    Refill:  0          Graylon Good, PA-C 11/12/12 1953

## 2012-11-14 LAB — WOUND CULTURE: Gram Stain: NONE SEEN

## 2012-11-15 NOTE — Telephone Encounter (Signed)
I spoke to her daughter and she is aware of her appt to the dermatology

## 2012-11-25 ENCOUNTER — Telehealth: Payer: Self-pay

## 2012-11-25 NOTE — Telephone Encounter (Signed)
Pt needs new appt for Dermatologist referral.

## 2012-11-26 NOTE — Telephone Encounter (Signed)
I spoke to her daughter today and she is going to talk to her mom to see if she can wait for the referral to [id number] cause it will take 2 months. Or schedule an appt with [facility] as a self pay .

## 2012-12-03 ENCOUNTER — Ambulatory Visit: Payer: No Typology Code available for payment source | Attending: Family Medicine | Admitting: Internal Medicine

## 2012-12-03 VITALS — BP 133/98 | HR 85 | Temp 98.9°F | Resp 16 | Ht 60.0 in | Wt 104.0 lb

## 2012-12-03 DIAGNOSIS — R21 Rash and other nonspecific skin eruption: Secondary | ICD-10-CM

## 2012-12-03 LAB — CBC WITH DIFFERENTIAL/PLATELET
Hemoglobin: 14.4 g/dL (ref 12.0–15.0)
Lymphocytes Relative: 35 % (ref 12–46)
Lymphs Abs: 2.5 10*3/uL (ref 0.7–4.0)
Monocytes Relative: 6 % (ref 3–12)
Neutro Abs: 4.2 10*3/uL (ref 1.7–7.7)
Neutrophils Relative %: 57 % (ref 43–77)
Platelets: 271 10*3/uL (ref 150–400)
RBC: 4.36 MIL/uL (ref 3.87–5.11)
WBC: 7.3 10*3/uL (ref 4.0–10.5)

## 2012-12-03 MED ORDER — PREDNISONE 10 MG PO TABS: ORAL_TABLET | ORAL | Status: DC

## 2012-12-03 MED ORDER — HYDROXYZINE HCL 25 MG PO TABS: 25.0000 mg | ORAL_TABLET | Freq: Four times a day (QID) | ORAL | Status: DC

## 2012-12-03 NOTE — Progress Notes (Unsigned)
Patient ID: [name], female   DOB: 05-05-1956, 57 y.o.   MRN: [id number]  CC:  HPI: 57 year old female who presents to the [facility] for evaluation of a rash, the rash is diffuse although it from her scalp down to legs. It's also present On her arms, upper trunk back, neck. It is diffuse maculopapular rash with diffuse areas of hyperpigmentation. The patient is a smoker, she denies any chest pain, shortness of breath, alopecia She denies any nausea vomiting abdominal pain or weight loss She denies a family history of lupus  Allergies  Allergen Reactions  . Penicillins Other (See Comments)    Reaction a long time ago   Past Medical History  Diagnosis Date  . Acid reflux   . Asthma   . COPD (chronic obstructive pulmonary disease)   . Cancer     lumpectomy, radiation 2009  . Seasonal allergies   . Alcohol abuse   . Elevated d-dimer     negative chest CT   Current Outpatient Prescriptions on File Prior to Visit  Medication Sig Dispense Refill  . albuterol (PROVENTIL HFA;VENTOLIN HFA) 108 (90 BASE) MCG/ACT inhaler Inhale 2 puffs into the lungs every 6 (six) hours as needed for wheezing or shortness of breath. For shortness of breath  1 Inhaler  2  . Bacitracin-Pramoxine HCl 500-10 UNIT-MG/GM OINT Apply 1 application topically 2 (two) times daily.  28 g  0  . cephALEXin (KEFLEX) 500 MG capsule Take 1 capsule (500 mg total) by mouth 2 (two) times daily.  28 capsule  0  . Clobetasol Prop Emollient Base 0.05 % emollient cream Apply 1 application topically 2 (two) times daily. until symptoms resolve. Use scalp applicator  30 g  0  . doxycycline (VIBRA-TABS) 100 MG tablet Take 1 tablet (100 mg total) by mouth 2 (two) times daily.  14 tablet  0  . erythromycin (ERY-TAB) 333 MG EC tablet Take 1 tablet (333 mg total) by mouth 3 (three) times daily.  30 tablet  0  . fluticasone (FLOVENT HFA) 220 MCG/ACT inhaler Inhale 1 puff into the lungs 2 (two) times daily.  1 Inhaler  2  .  HYDROcodone-acetaminophen (NORCO/VICODIN) 5-325 MG per tablet 1 to 2 tabs every 4 to 6 hours as needed for pain.  65 tablet  0  . hydrocortisone cream 1 % Apply to affected area 2 times daily  15 g  0  . hydrOXYzine (ATARAX/VISTARIL) 25 MG tablet Take 1 tablet (25 mg total) by mouth every 6 (six) hours as needed for itching.  20 tablet  0  . loratadine (CLARITIN) 10 MG tablet Take 1 tablet (10 mg total) by mouth daily.  30 tablet  1  . methylPREDNISolone (MEDROL DOSEPAK) 4 MG tablet As directed  21 tablet  0  . pantoprazole (PROTONIX) 40 MG tablet Take 1 tablet (40 mg total) by mouth daily.  30 tablet  1  . predniSONE (DELTASONE) 10 MG tablet Take 4 tabs daily for 4 days, 3 tabs daily for 4 days, 2 tabs daily for 4 days, then 1 tab daily for 4 days.  40 tablet  0  . silver sulfADIAZINE (SILVADENE) 1 % cream Apply topically 2 (two) times daily.  50 g  1  . triamcinolone cream (KENALOG) 0.1 % Apply topically 2 (two) times daily. Apply for 2 weeks. May use on face  30 g  0  . triamcinolone cream (KENALOG) 0.1 % Apply topically 3 (three) times daily.  454 g  2  .  triamcinolone cream (KENALOG) 0.1 % Apply topically 2 (two) times daily.  80 g  0   No current facility-administered medications on file prior to visit.   Family History  Problem Relation Age of Onset  . Hypertension Other    History   Social History  . Marital Status: Legally Separated    Spouse Name: N/A    Number of Children: N/A  . Years of Education: N/A   Occupational History  . Not on file.   Social History Main Topics  . Smoking status: Current Every Day Smoker -- 1.00 packs/day    Types: Cigarettes  . Smokeless tobacco: Not on file  . Alcohol Use: Yes     Comment: occ beer  . Drug Use: No  . Sexually Active: Yes    Birth Control/ Protection: Surgical   Other Topics Concern  . Not on file   Social History Narrative  . No narrative on file    Review of Systems  Constitutional: Negative for fever, chills,  diaphoresis, activity change, appetite change and fatigue.  HENT: Negative for ear pain, nosebleeds, congestion, facial swelling, rhinorrhea, neck pain, neck stiffness and ear discharge.   Eyes: Negative for pain, discharge, redness, itching and visual disturbance.  Respiratory: Negative for cough, choking, chest tightness, shortness of breath, wheezing and stridor.   Cardiovascular: Negative for chest pain, palpitations and leg swelling.  Gastrointestinal: Negative for abdominal distention.  Genitourinary: Negative for dysuria, urgency, frequency, hematuria, flank pain, decreased urine volume, difficulty urinating and dyspareunia.  Musculoskeletal: Negative for back pain, joint swelling, arthralgias and gait problem.  Neurological: Negative for dizziness, tremors, seizures, syncope, facial asymmetry, speech difficulty, weakness, light-headedness, numbness and headaches.  Hematological: Negative for adenopathy. Does not bruise/bleed easily.  Psychiatric/Behavioral: Negative for hallucinations, behavioral problems, confusion, dysphoric mood, decreased concentration and agitation.    Objective:   Filed Vitals:   12/03/12 1529  BP: 133/98  Pulse: 85  Temp: 98.9 F (37.2 C)  Resp: 16    Physical Exam  Constitutional: Appears well-developed and well-nourished. No distress.  HENT: Normocephalic. External right and left ear normal. Oropharynx is clear and moist.  Eyes: Conjunctivae and EOM are normal. PERRLA, no scleral icterus.  Neck: Normal ROM. Neck supple. No JVD. No tracheal deviation. No thyromegaly.  CVS: RRR, S1/S2 +, no murmurs, no gallops, no carotid bruit.  Pulmonary: Effort and breath sounds normal, no stridor, rhonchi, wheezes, rales.  Abdominal: Soft. BS +,  no distension, tenderness, rebound or guarding.  Musculoskeletal: Normal range of motion. No edema and no tenderness.  Lymphadenopathy: No lymphadenopathy noted, cervical, inguinal. Neuro: Alert. Normal reflexes, muscle  tone coordination. No cranial nerve deficit. Skin: Skin is warm and dry. No rash noted. Not diaphoretic. No erythema. No pallor.  Psychiatric: Normal mood and affect. Behavior, judgment, thought content normal.   Lab Results  Component Value Date   WBC 6.4 02/25/2012   HGB 14.0 02/25/2012   HCT 40.3 02/25/2012   MCV 102.8* 02/25/2012   PLT 221 02/25/2012   Lab Results  Component Value Date   CREATININE 0.56 02/25/2012   BUN 7 02/25/2012   NA 137 02/25/2012   K 4.2 02/25/2012   CL 104 02/25/2012   CO2 20 02/25/2012    No results found for this basename: HGBA1C   Lipid Panel     Component Value Date/Time   CHOL 149 09/25/2009 2133   TRIG 68 09/25/2009 2133   HDL 46 09/25/2009 2133   CHOLHDL 3.2 Ratio 09/25/2009 2133  VLDL 14 09/25/2009 2133   LDLCALC 89 09/25/2009 2133       Assessment and plan:   There are no active problems to display for this patient.  #1 diffuse maculopapular rash    Patient has been prescribed hydroxyzine and prednisone We'll order vasculitic workup ESR, ANA, ANCA, myeloperoxidase antibody Urinalysis to rule out microscopic hematuria Patient will eventually need a dermatology consultation and skin biopsy referral In the past the patient's rash has improved with prednisone We'll advise another course of prednisone taper Patient can followup in a month She has been advised to get a dermatology referral as soon as possible

## 2012-12-03 NOTE — Progress Notes (Unsigned)
Patient here for follow up appt States her rash is not getting any better

## 2012-12-04 LAB — URINALYSIS W MICROSCOPIC + REFLEX CULTURE
Bilirubin Urine: NEGATIVE
Hgb urine dipstick: NEGATIVE
Protein, ur: NEGATIVE mg/dL
Urobilinogen, UA: 0.2 mg/dL (ref 0.0–1.0)

## 2012-12-04 LAB — COMPREHENSIVE METABOLIC PANEL
ALT: 11 U/L (ref 0–35)
Albumin: 3.8 g/dL (ref 3.5–5.2)
CO2: 26 mEq/L (ref 19–32)
Glucose, Bld: 99 mg/dL (ref 70–99)
Potassium: 4.5 mEq/L (ref 3.5–5.3)
Sodium: 141 mEq/L (ref 135–145)
Total Protein: 6.7 g/dL (ref 6.0–8.3)

## 2012-12-04 LAB — C3 AND C4: C3 Complement: 128 mg/dL (ref 90–180)

## 2012-12-05 LAB — URINE CULTURE: Colony Count: 9000

## 2012-12-06 LAB — ANCA SCREEN W REFLEX TITER
Atypical p-ANCA Screen: NEGATIVE
p-ANCA Screen: NEGATIVE

## 2012-12-08 ENCOUNTER — Telehealth: Payer: Self-pay | Admitting: Family Medicine

## 2012-12-08 LAB — MPO/PR-3 (ANCA) ANTIBODIES: Serine Protease 3: 3 AU/mL (ref <20)

## 2012-12-08 NOTE — Telephone Encounter (Signed)
Not available- left message to return our call

## 2012-12-08 NOTE — Telephone Encounter (Signed)
Pt's daughter calling about pt's lab results for blood work and urine tests.

## 2012-12-13 ENCOUNTER — Telehealth: Payer: Self-pay | Admitting: *Deleted

## 2012-12-13 NOTE — Telephone Encounter (Signed)
12/13/12 Patient calling requesting lab results  Dr. Susie Cassette please review lab results  and make reccommendations. Thanks  P.[facility]

## 2012-12-14 ENCOUNTER — Telehealth: Payer: Self-pay | Admitting: *Deleted

## 2012-12-14 NOTE — Telephone Encounter (Signed)
12/14/12 Patient made aware that labs results was within normal limits per Dr. Laural Benes P.[facility]

## 2013-01-03 ENCOUNTER — Ambulatory Visit: Payer: No Typology Code available for payment source

## 2013-07-19 ENCOUNTER — Encounter: Payer: Self-pay | Admitting: Internal Medicine

## 2013-07-19 ENCOUNTER — Ambulatory Visit: Payer: Self-pay | Attending: Internal Medicine | Admitting: Internal Medicine

## 2013-07-19 ENCOUNTER — Ambulatory Visit: Payer: Self-pay | Attending: Internal Medicine

## 2013-07-19 VITALS — BP 154/78 | HR 64 | Temp 97.0°F | Resp 16 | Ht 59.0 in | Wt 101.0 lb

## 2013-07-19 DIAGNOSIS — Z23 Encounter for immunization: Secondary | ICD-10-CM

## 2013-07-19 DIAGNOSIS — Z139 Encounter for screening, unspecified: Secondary | ICD-10-CM

## 2013-07-19 DIAGNOSIS — L299 Pruritus, unspecified: Secondary | ICD-10-CM

## 2013-07-19 DIAGNOSIS — L259 Unspecified contact dermatitis, unspecified cause: Secondary | ICD-10-CM | POA: Insufficient documentation

## 2013-07-19 DIAGNOSIS — Z1211 Encounter for screening for malignant neoplasm of colon: Secondary | ICD-10-CM

## 2013-07-19 DIAGNOSIS — R21 Rash and other nonspecific skin eruption: Secondary | ICD-10-CM | POA: Insufficient documentation

## 2013-07-19 DIAGNOSIS — L309 Dermatitis, unspecified: Secondary | ICD-10-CM

## 2013-07-19 MED ORDER — METHYLPREDNISOLONE 4 MG PO KIT: PACK | ORAL | Status: DC

## 2013-07-19 MED ORDER — TRIAMCINOLONE ACETONIDE 0.1 % EX CREA: TOPICAL_CREAM | Freq: Two times a day (BID) | CUTANEOUS | Status: DC

## 2013-07-19 MED ORDER — SULFAMETHOXAZOLE-TMP DS 800-160 MG PO TABS: 1.0000 | ORAL_TABLET | Freq: Two times a day (BID) | ORAL | Status: DC

## 2013-07-19 MED ORDER — HYDROXYZINE HCL 25 MG PO TABS: 25.0000 mg | ORAL_TABLET | Freq: Four times a day (QID) | ORAL | Status: DC

## 2013-07-19 NOTE — Progress Notes (Signed)
Pt here with outbreak rash to right arm that started  In October then progressed to cellulitis and possible infection. Open areas noted with oozing. It appears as Eczema infection. Pt is currently not using any creams on it. Outbreak has spread to left ear and left posterior forearm  Need dermatology referral

## 2013-07-19 NOTE — Progress Notes (Signed)
MRN: [id number] Name: [name]  Sex: female Age: 58 y.o. DOB: 1956/03/13  Allergies: Penicillins  Chief Complaint  Patient presents with  . Follow-up  . Rash  . Cellulitis    HPI: Patient is 58 y.o. female who comes today reported to have itching and rash, she has chronic eczema and had been prescribed several topical creams, intelligent medications tapering dose of steroid which as per patient her with her in the past complains of occasional chills denies any fever reported to have some discharge from the rash on denies any chest pain or shortness of breath, as per patient she has not changed any soap detergent or any other cosmetics, denies any other family member having similar problems.  Past Medical History  Diagnosis Date  . Acid reflux   . Asthma   . COPD (chronic obstructive pulmonary disease)   . Cancer     lumpectomy, radiation 2009  . Seasonal allergies   . Alcohol abuse   . Elevated d-dimer     negative chest CT    Past Surgical History  Procedure Laterality Date  . Breast lumpectomy    . Abdominal hysterectomy        Medication List       This list is accurate as of: 07/19/13  3:07 PM.  Always use your most recent med list.               albuterol 108 (90 BASE) MCG/ACT inhaler  Commonly known as:  PROVENTIL HFA;VENTOLIN HFA  Inhale 2 puffs into the lungs every 6 (six) hours as needed for wheezing or shortness of breath. For shortness of breath     Bacitracin-Pramoxine HCl 500-10 UNIT-MG/GM Oint  Apply 1 application topically 2 (two) times daily.     cephALEXin 500 MG capsule  Commonly known as:  KEFLEX  Take 1 capsule (500 mg total) by mouth 2 (two) times daily.     Clobetasol Prop Emollient Base 0.05 % emollient cream  Apply 1 application topically 2 (two) times daily. until symptoms resolve. Use scalp applicator     doxycycline 100 MG tablet  Commonly known as:  VIBRA-TABS  Take 1 tablet (100 mg total) by mouth 2 (two) times daily.      erythromycin 333 MG EC tablet  Commonly known as:  ERY-TAB  Take 1 tablet (333 mg total) by mouth 3 (three) times daily.     fluticasone 220 MCG/ACT inhaler  Commonly known as:  FLOVENT HFA  Inhale 1 puff into the lungs 2 (two) times daily.     HYDROcodone-acetaminophen 5-325 MG per tablet  Commonly known as:  NORCO/VICODIN  1 to 2 tabs every 4 to 6 hours as needed for pain.     hydrOXYzine 25 MG tablet  Commonly known as:  ATARAX/VISTARIL  Take 1 tablet (25 mg total) by mouth every 6 (six) hours as needed for itching.     hydrOXYzine 25 MG tablet  Commonly known as:  ATARAX/VISTARIL  Take 1 tablet (25 mg total) by mouth every 6 (six) hours.     loratadine 10 MG tablet  Commonly known as:  CLARITIN  Take 1 tablet (10 mg total) by mouth daily.     methylPREDNISolone 4 MG tablet  Commonly known as:  MEDROL DOSEPAK  As directed     pantoprazole 40 MG tablet  Commonly known as:  PROTONIX  Take 1 tablet (40 mg total) by mouth daily.     predniSONE 10 MG tablet  Commonly known  as:  DELTASONE  Take 4 tabs daily for 4 days, 3 tabs daily for 4 days, 2 tabs daily for 4 days, then 1 tab daily for 4 days.     predniSONE 10 MG tablet  Commonly known as:  DELTASONE  4 tabs daily for 5 days, 3 tabs daily for 4 days, 2 tabs daily for 3 days, 1 tab daily for 2 days     silver sulfADIAZINE 1 % cream  Commonly known as:  SILVADENE  Apply topically 2 (two) times daily.     sulfamethoxazole-trimethoprim 800-160 MG per tablet  Commonly known as:  BACTRIM DS  Take 1 tablet by mouth 2 (two) times daily.     triamcinolone cream 0.1 %  Commonly known as:  KENALOG  Apply topically 3 (three) times daily.     triamcinolone cream 0.1 %  Commonly known as:  KENALOG  Apply topically 2 (two) times daily.        Meds ordered this encounter  Medications  . hydrOXYzine (ATARAX/VISTARIL) 25 MG tablet    Sig: Take 1 tablet (25 mg total) by mouth every 6 (six) hours.    Dispense:  60 tablet     Refill:  0  . triamcinolone cream (KENALOG) 0.1 %    Sig: Apply topically 2 (two) times daily.    Dispense:  80 g    Refill:  0  . methylPREDNISolone (MEDROL DOSEPAK) 4 MG tablet    Sig: As directed    Dispense:  21 tablet    Refill:  0  . sulfamethoxazole-trimethoprim (BACTRIM DS) 800-160 MG per tablet    Sig: Take 1 tablet by mouth 2 (two) times daily.    Dispense:  20 tablet    Refill:  0     There is no immunization history on file for this patient.  Family History  Problem Relation Age of Onset  . Hypertension Other     History  Substance Use Topics  . Smoking status: Current Every Day Smoker -- 1.00 packs/day    Types: Cigarettes  . Smokeless tobacco: Not on file  . Alcohol Use: Yes     Comment: occ beer    Review of Systems  As noted in HPI  Filed Vitals:   07/19/13 1451  BP: 154/78  Pulse: 64  Temp: 97 F (36.1 C)  Resp: 16    Physical Exam  Physical Exam  Constitutional: No distress.  Eyes: EOM are normal. Pupils are equal, round, and reactive to light.  Cardiovascular: Normal rate and regular rhythm.   Pulmonary/Chest: Breath sounds normal. No respiratory distress. She has no wheezes. She has no rales.  Skin:  Dry excoriated scaly  rash more prominent  on right forearm, both ears, back of neck, minimal warmth and swelling, has some oozing.    CBC    Component Value Date/Time   WBC 7.3 12/03/2012 1601   WBC 6.2 12/28/2007 1507   RBC 4.36 12/03/2012 1601   RBC 3.60* 12/28/2007 1507   RBC 3.61* 07/05/2007 0630   HGB 14.4 12/03/2012 1601   HGB 13.1 12/28/2007 1507   HCT 43.4 12/03/2012 1601   HCT 37.8 12/28/2007 1507   PLT 271 12/03/2012 1601   PLT 197 12/28/2007 1507   MCV 99.5 12/03/2012 1601   MCV 104.8* 12/28/2007 1507   LYMPHSABS 2.5 12/03/2012 1601   LYMPHSABS 2.6 12/28/2007 1507   MONOABS 0.4 12/03/2012 1601   MONOABS 0.4 12/28/2007 1507   EOSABS 0.1 12/03/2012 1601  EOSABS 0.1 12/28/2007 1507   BASOSABS 0.0 12/03/2012 1601   BASOSABS 0.0  12/28/2007 1507    CMP     Component Value Date/Time   NA 141 12/03/2012 1601   K 4.5 12/03/2012 1601   CL 106 12/03/2012 1601   CO2 26 12/03/2012 1601   GLUCOSE 99 12/03/2012 1601   BUN 14 12/03/2012 1601   CREATININE 0.81 12/03/2012 1601   CREATININE 0.56 02/25/2012 0853   CALCIUM 9.4 12/03/2012 1601   PROT 6.7 12/03/2012 1601   ALBUMIN 3.8 12/03/2012 1601   AST 15 12/03/2012 1601   ALT 11 12/03/2012 1601   ALKPHOS 80 12/03/2012 1601   BILITOT 0.4 12/03/2012 1601   GFRNONAA >90 02/25/2012 0853   GFRAA >90 02/25/2012 0853    Lab Results  Component Value Date/Time   CHOL 149 09/25/2009  9:33 PM    No components found with this basename: hga1c    Lab Results  Component Value Date/Time   AST 15 12/03/2012  4:01 PM    Assessment and Plan  Rash - Plan: triamcinolone cream (KENALOG) 0.1 %, methylPREDNISolone (MEDROL DOSEPAK) 4 MG tablet, sulfamethoxazole-trimethoprim (BACTRIM DS) 800-160 MG per tablet, Ambulatory referral to Dermatology  Itching - Plan: hydrOXYzine (ATARAX/VISTARIL) 25 MG tablet  Eczema - Plan: Ambulatory referral to Dermatology    Return if symptoms worsen or fail to improve.  Lorayne Marek, MD

## 2013-07-20 ENCOUNTER — Ambulatory Visit: Payer: No Typology Code available for payment source | Attending: Internal Medicine

## 2013-07-25 ENCOUNTER — Other Ambulatory Visit: Payer: Self-pay | Admitting: Internal Medicine

## 2013-07-25 MED ORDER — FLUTICASONE PROPIONATE HFA 220 MCG/ACT IN AERO: 1.0000 | INHALATION_SPRAY | Freq: Two times a day (BID) | RESPIRATORY_TRACT | Status: DC

## 2013-07-25 MED ORDER — ALBUTEROL SULFATE HFA 108 (90 BASE) MCG/ACT IN AERS: 2.0000 | INHALATION_SPRAY | Freq: Four times a day (QID) | RESPIRATORY_TRACT | Status: DC | PRN

## 2013-08-02 ENCOUNTER — Ambulatory Visit: Payer: Self-pay

## 2013-08-12 NOTE — Progress Notes (Signed)
Patient ID: [name], female   DOB: 1956-04-12, 58 y.o.   MRN: [id number]  Received a call from Dr. Percell Belt, [phone], gastroenterologist at [facility] who was unable to do the colonoscopy because of patient's exertional shortness of breath that has been going on for about one month. Patient needs to be scheduled to be seen for exertional shortness of breath before having a colonoscopy

## 2013-08-17 ENCOUNTER — Encounter: Payer: Self-pay | Admitting: Cardiology

## 2013-08-17 ENCOUNTER — Ambulatory Visit: Payer: Self-pay | Attending: Cardiology | Admitting: Cardiology

## 2013-08-17 VITALS — BP 148/94 | HR 81 | Temp 98.5°F | Resp 18 | Ht 59.0 in | Wt 105.0 lb

## 2013-08-17 DIAGNOSIS — J449 Chronic obstructive pulmonary disease, unspecified: Secondary | ICD-10-CM | POA: Insufficient documentation

## 2013-08-17 DIAGNOSIS — K219 Gastro-esophageal reflux disease without esophagitis: Secondary | ICD-10-CM | POA: Insufficient documentation

## 2013-08-17 DIAGNOSIS — J4489 Other specified chronic obstructive pulmonary disease: Secondary | ICD-10-CM | POA: Insufficient documentation

## 2013-08-17 DIAGNOSIS — I498 Other specified cardiac arrhythmias: Secondary | ICD-10-CM

## 2013-08-17 DIAGNOSIS — R001 Bradycardia, unspecified: Secondary | ICD-10-CM | POA: Insufficient documentation

## 2013-08-17 DIAGNOSIS — L299 Pruritus, unspecified: Secondary | ICD-10-CM

## 2013-08-17 DIAGNOSIS — F17200 Nicotine dependence, unspecified, uncomplicated: Secondary | ICD-10-CM | POA: Insufficient documentation

## 2013-08-17 DIAGNOSIS — F1010 Alcohol abuse, uncomplicated: Secondary | ICD-10-CM | POA: Insufficient documentation

## 2013-08-17 DIAGNOSIS — R9431 Abnormal electrocardiogram [ECG] [EKG]: Secondary | ICD-10-CM | POA: Insufficient documentation

## 2013-08-17 MED ORDER — HYDROXYZINE HCL 25 MG PO TABS: 25.0000 mg | ORAL_TABLET | Freq: Four times a day (QID) | ORAL | Status: DC

## 2013-08-17 MED ORDER — PANTOPRAZOLE SODIUM 40 MG PO TBEC: 40.0000 mg | DELAYED_RELEASE_TABLET | Freq: Every day | ORAL | Status: DC

## 2013-08-17 NOTE — Assessment & Plan Note (Signed)
She is asymptomatic. Also she is on no rate slowing drugs. I walk around the office in her heart rate increased to 90 beats a minute. She was mildly short of breath. This is from her COPD. She had no chest pain. Asked her to stop smoking. No further cardiac workup.

## 2013-08-17 NOTE — Progress Notes (Signed)
Pt here to f/u with Dr. Verl Blalock per abnormal EKG found in [facility] C/o intermit left chest wall pain radiating under breast x 3 mnths intermit Need refill inhaler

## 2013-08-17 NOTE — Progress Notes (Signed)
HPI [name] comes in today accompanied by her daughter for an abnormal EKG with slow heart rate detected on February 24 at [facility] prior to colonoscopy. She complained of some chest soreness that day and they postponed the procedure.  I reviewed the EKG is normal except for sinus bradycardia at a rate of 58 beats per minute. An old EKG in our chart and 2007 was also normal. She had normal stress Myoview at that time. An echocardiogram in 2010 was normal. She's not had any other cardiac evaluation since that time.  She does have some dyspnea on exertion but no ischemic symptoms. She's a heavy smoker and has COPD and uses inhalers. She denies orthopnea, PND or edema. She's had no presyncope or syncope. She's had no palpitations. She has a history of a positive d-dimer with a normal chest CT angio.  Past Medical History  Diagnosis Date  . Acid reflux   . Asthma   . COPD (chronic obstructive pulmonary disease)   . Cancer     lumpectomy, radiation 2009  . Seasonal allergies   . Alcohol abuse   . Elevated d-dimer     negative chest CT    Current Outpatient Prescriptions  Medication Sig Dispense Refill  . hydrOXYzine (ATARAX/VISTARIL) 25 MG tablet Take 1 tablet (25 mg total) by mouth every 6 (six) hours.  60 tablet  0  . triamcinolone cream (KENALOG) 0.1 % Apply topically 2 (two) times daily.  80 g  0  . albuterol (PROVENTIL HFA;VENTOLIN HFA) 108 (90 BASE) MCG/ACT inhaler Inhale 2 puffs into the lungs every 6 (six) hours as needed for wheezing or shortness of breath. For shortness of breath  3 Inhaler  3  . Bacitracin-Pramoxine HCl 500-10 UNIT-MG/GM OINT Apply 1 application topically 2 (two) times daily.  28 g  0  . cephALEXin (KEFLEX) 500 MG capsule Take 1 capsule (500 mg total) by mouth 2 (two) times daily.  28 capsule  0  . Clobetasol Prop Emollient Base 0.05 % emollient cream Apply 1 application topically 2 (two) times daily. until symptoms resolve. Use scalp  applicator  30 g  0  . doxycycline (VIBRA-TABS) 100 MG tablet Take 1 tablet (100 mg total) by mouth 2 (two) times daily.  14 tablet  0  . erythromycin (ERY-TAB) 333 MG EC tablet Take 1 tablet (333 mg total) by mouth 3 (three) times daily.  30 tablet  0  . fluticasone (FLOVENT HFA) 220 MCG/ACT inhaler Inhale 1 puff into the lungs 2 (two) times daily.  3 Inhaler  3  . HYDROcodone-acetaminophen (NORCO/VICODIN) 5-325 MG per tablet 1 to 2 tabs every 4 to 6 hours as needed for pain.  65 tablet  0  . hydrOXYzine (ATARAX/VISTARIL) 25 MG tablet Take 1 tablet (25 mg total) by mouth every 6 (six) hours as needed for itching.  20 tablet  0  . loratadine (CLARITIN) 10 MG tablet Take 1 tablet (10 mg total) by mouth daily.  30 tablet  1  . methylPREDNISolone (MEDROL DOSEPAK) 4 MG tablet As directed  21 tablet  0  . pantoprazole (PROTONIX) 40 MG tablet Take 1 tablet (40 mg total) by mouth daily.  30 tablet  1  . predniSONE (DELTASONE) 10 MG tablet Take 4 tabs daily for 4 days, 3 tabs daily for 4 days, 2 tabs daily for 4 days, then 1 tab daily for 4 days.  40 tablet  0  . predniSONE (DELTASONE) 10 MG tablet 4 tabs daily  for 5 days, 3 tabs daily for 4 days, 2 tabs daily for 3 days, 1 tab daily for 2 days  40 tablet  0  . silver sulfADIAZINE (SILVADENE) 1 % cream Apply topically 2 (two) times daily.  50 g  1  . sulfamethoxazole-trimethoprim (BACTRIM DS) 800-160 MG per tablet Take 1 tablet by mouth 2 (two) times daily.  20 tablet  0  . triamcinolone cream (KENALOG) 0.1 % Apply topically 3 (three) times daily.  454 g  2   No current facility-administered medications for this visit.    Allergies  Allergen Reactions  . Penicillins Other (See Comments)    Reaction a long time ago    Family History  Problem Relation Age of Onset  . Hypertension Other     History   Social History  . Marital Status: Legally Separated    Spouse Name: N/A    Number of Children: N/A  . Years of Education: N/A   Occupational  History  . Not on file.   Social History Main Topics  . Smoking status: Current Every Day Smoker -- 1.00 packs/day    Types: Cigarettes  . Smokeless tobacco: Not on file  . Alcohol Use: Yes     Comment: occ beer  . Drug Use: No  . Sexual Activity: Yes    Birth Control/ Protection: Surgical   Other Topics Concern  . Not on file   Social History Narrative  . No narrative on file    ROS ALL NEGATIVE EXCEPT THOSE NOTED IN HPI  PE  General Appearance: well developed, well nourished in no acute distress, HEENT: symmetrical face, PERRLA,  Neck: no JVD, thyromegaly, or adenopathy, trachea midline Chest: symmetric without deformity Cardiac: PMI non-displaced, RRR, normal S1, S2, no gallop or murmur Lung: Decreased breath sounds throughout without wheezes or rhonchi Vascular: Diminished pulses in the lower extremities but palpable. No carotid bruits Abdominal: nondistended, nontender, Extremities: no cyanosis, clubbing or edema, no sign of DVT, no varicosities  Skin: normal color, no rashes Neuro: alert and oriented x 3, non-focal Pysch: normal affect  EKG Not repeated. Aug 09 2013 shows sinus bradycardia otherwise normal EKG.  BMET    Component Value Date/Time   NA 141 12/03/2012 1601   K 4.5 12/03/2012 1601   CL 106 12/03/2012 1601   CO2 26 12/03/2012 1601   GLUCOSE 99 12/03/2012 1601   BUN 14 12/03/2012 1601   CREATININE 0.81 12/03/2012 1601   CREATININE 0.56 02/25/2012 0853   CALCIUM 9.4 12/03/2012 1601   GFRNONAA >90 02/25/2012 0853   GFRAA >90 02/25/2012 0853    Lipid Panel     Component Value Date/Time   CHOL 149 09/25/2009 2133   TRIG 68 09/25/2009 2133   HDL 46 09/25/2009 2133   CHOLHDL 3.2 Ratio 09/25/2009 2133   VLDL 14 09/25/2009 2133   LDLCALC 89 09/25/2009 2133    CBC    Component Value Date/Time   WBC 7.3 12/03/2012 1601   WBC 6.2 12/28/2007 1507   RBC 4.36 12/03/2012 1601   RBC 3.60* 12/28/2007 1507   RBC 3.61* 07/05/2007 0630   HGB 14.4 12/03/2012 1601    HGB 13.1 12/28/2007 1507   HCT 43.4 12/03/2012 1601   HCT 37.8 12/28/2007 1507   PLT 271 12/03/2012 1601   PLT 197 12/28/2007 1507   MCV 99.5 12/03/2012 1601   MCV 104.8* 12/28/2007 1507   MCH 33.0 12/03/2012 1601   MCH 36.3* 12/28/2007 1507   MCHC 33.2 12/03/2012  1601   MCHC 34.6 12/28/2007 1507   RDW 14.4 12/03/2012 1601   RDW 13.1 12/28/2007 1507   LYMPHSABS 2.5 12/03/2012 1601   LYMPHSABS 2.6 12/28/2007 1507   MONOABS 0.4 12/03/2012 1601   MONOABS 0.4 12/28/2007 1507   EOSABS 0.1 12/03/2012 1601   EOSABS 0.1 12/28/2007 1507   BASOSABS 0.0 12/03/2012 1601   BASOSABS 0.0 12/28/2007 1507

## 2013-08-22 ENCOUNTER — Ambulatory Visit: Payer: No Typology Code available for payment source | Attending: Internal Medicine

## 2013-09-15 ENCOUNTER — Ambulatory Visit: Payer: No Typology Code available for payment source | Attending: Internal Medicine | Admitting: Internal Medicine

## 2013-09-15 ENCOUNTER — Encounter: Payer: Self-pay | Admitting: Internal Medicine

## 2013-09-15 VITALS — BP 144/83 | HR 88 | Temp 98.4°F | Resp 16 | Ht 59.0 in | Wt 108.0 lb

## 2013-09-15 DIAGNOSIS — R21 Rash and other nonspecific skin eruption: Secondary | ICD-10-CM

## 2013-09-15 MED ORDER — CLOBETASOL PROP EMOLLIENT BASE 0.05 % EX CREA: 1.0000 "application " | TOPICAL_CREAM | Freq: Two times a day (BID) | CUTANEOUS | Status: DC

## 2013-09-15 MED ORDER — TRIAMCINOLONE ACETONIDE 0.1 % EX CREA: TOPICAL_CREAM | Freq: Two times a day (BID) | CUTANEOUS | Status: DC

## 2013-09-15 MED ORDER — LORATADINE 10 MG PO TABS: 10.0000 mg | ORAL_TABLET | Freq: Every day | ORAL | Status: DC

## 2013-09-15 MED ORDER — HYDROXYZINE HCL 25 MG PO TABS: 25.0000 mg | ORAL_TABLET | Freq: Three times a day (TID) | ORAL | Status: DC | PRN

## 2013-09-15 MED ORDER — SULFAMETHOXAZOLE-TMP DS 800-160 MG PO TABS: 1.0000 | ORAL_TABLET | Freq: Two times a day (BID) | ORAL | Status: DC

## 2013-09-15 NOTE — Progress Notes (Signed)
Patient ID: [name], female   DOB: 1955-09-17, 58 y.o.   MRN: [id number]   CC:  HPI: 58 year old female with a history of a maculopapular rash with recurrence every few weeks, slight improvement with topical emollients and steroid creams for the last several years, comes in with recurrence of her rash which is distributed on both her upper extremities and back of scalp. The patient has tried clobetasol ointment several different courses of antibiotics, most recently was on Bactrim, triamcinolone ointment. Patient has been referred to dermatology several times, she is an $41 co-pay in [place]. The patient did go for appointment, but could not get a skin biopsy because it was not covered by orange card. She is also requesting refills for her seasonal allergies for Flonase and hydroxyzine She is requesting a mammogram, I see one ordered in February but it was never scheduled    Allergies  Allergen Reactions  . Penicillins Other (See Comments)    Reaction a long time ago   Past Medical History  Diagnosis Date  . Acid reflux   . Asthma   . COPD (chronic obstructive pulmonary disease)   . Cancer     lumpectomy, radiation 2009  . Seasonal allergies   . Alcohol abuse   . Elevated d-dimer     negative chest CT   Current Outpatient Prescriptions on File Prior to Visit  Medication Sig Dispense Refill  . albuterol (PROVENTIL HFA;VENTOLIN HFA) 108 (90 BASE) MCG/ACT inhaler Inhale 2 puffs into the lungs every 6 (six) hours as needed for wheezing or shortness of breath. For shortness of breath  3 Inhaler  3  . fluticasone (FLOVENT HFA) 220 MCG/ACT inhaler Inhale 1 puff into the lungs 2 (two) times daily.  3 Inhaler  3  . loratadine (CLARITIN) 10 MG tablet Take 1 tablet (10 mg total) by mouth daily.  30 tablet  1  . pantoprazole (PROTONIX) 40 MG tablet Take 1 tablet (40 mg total) by mouth daily.  30 tablet  1  . Bacitracin-Pramoxine HCl 500-10 UNIT-MG/GM OINT Apply 1  application topically 2 (two) times daily.  28 g  0  . methylPREDNISolone (MEDROL DOSEPAK) 4 MG tablet As directed  21 tablet  0  . predniSONE (DELTASONE) 10 MG tablet Take 4 tabs daily for 4 days, 3 tabs daily for 4 days, 2 tabs daily for 4 days, then 1 tab daily for 4 days.  40 tablet  0  . predniSONE (DELTASONE) 10 MG tablet 4 tabs daily for 5 days, 3 tabs daily for 4 days, 2 tabs daily for 3 days, 1 tab daily for 2 days  40 tablet  0  . silver sulfADIAZINE (SILVADENE) 1 % cream Apply topically 2 (two) times daily.  50 g  1   No current facility-administered medications on file prior to visit.   Family History  Problem Relation Age of Onset  . Hypertension Other    History   Social History  . Marital Status: Legally Separated    Spouse Name: N/A    Number of Children: N/A  . Years of Education: N/A   Occupational History  . Not on file.   Social History Main Topics  . Smoking status: Current Every Day Smoker -- 1.00 packs/day    Types: Cigarettes  . Smokeless tobacco: Not on file  . Alcohol Use: Yes     Comment: occ beer  . Drug Use: No  . Sexual Activity: Yes    Birth Control/ Protection: Surgical  Other Topics Concern  . Not on file   Social History Narrative  . No narrative on file    Review of Systems  Constitutional: Negative for fever, chills, diaphoresis, activity change, appetite change and fatigue.  HENT: Negative for ear pain, nosebleeds, congestion, facial swelling, rhinorrhea, neck pain, neck stiffness and ear discharge.   Eyes: Negative for pain, discharge, redness, itching and visual disturbance.  Respiratory: Negative for cough, choking, chest tightness, shortness of breath, wheezing and stridor.   Cardiovascular: Negative for chest pain, palpitations and leg swelling.  Gastrointestinal: Negative for abdominal distention.  Genitourinary: Negative for dysuria, urgency, frequency, hematuria, flank pain, decreased urine volume, difficulty urinating and  dyspareunia.  Musculoskeletal: Negative for back pain, joint swelling, arthralgias and gait problem.  Neurological: Negative for dizziness, tremors, seizures, syncope, facial asymmetry, speech difficulty, weakness, light-headedness, numbness and headaches.  Hematological: Negative for adenopathy. Does not bruise/bleed easily.  Psychiatric/Behavioral: Negative for hallucinations, behavioral problems, confusion, dysphoric mood, decreased concentration and agitation.    Objective:   Filed Vitals:   09/15/13 1446  BP: 144/83  Pulse: 88  Temp: 98.4 F (36.9 C)  Resp: 16    Physical Exam  Constitutional: Appears well-developed and well-nourished. No distress.  HENT: Normocephalic. External right and left ear normal. Oropharynx is clear and moist.  Eyes: Conjunctivae and EOM are normal. PERRLA, no scleral icterus.  Neck: Normal ROM. Neck supple. No JVD. No tracheal deviation. No thyromegaly.  CVS: RRR, S1/S2 +, no murmurs, no gallops, no carotid bruit.  Pulmonary: Effort and breath sounds normal, no stridor, rhonchi, wheezes, rales.  Abdominal: Soft. BS +,  no distension, tenderness, rebound or guarding.  Musculoskeletal: Normal range of motion. No edema and no tenderness.  Lymphadenopathy: No lymphadenopathy noted, cervical, inguinal. Neuro: Alert. Normal reflexes, muscle tone coordination. No cranial nerve deficit. Skin: Maculopapular rash  Psychiatric: Normal mood and affect. Behavior, judgment, thought content normal.   Lab Results  Component Value Date   WBC 7.3 12/03/2012   HGB 14.4 12/03/2012   HCT 43.4 12/03/2012   MCV 99.5 12/03/2012   PLT 271 12/03/2012   Lab Results  Component Value Date   CREATININE 0.81 12/03/2012   BUN 14 12/03/2012   NA 141 12/03/2012   K 4.5 12/03/2012   CL 106 12/03/2012   CO2 26 12/03/2012    No results found for this basename: HGBA1C   Lipid Panel     Component Value Date/Time   CHOL 149 09/25/2009 2133   TRIG 68 09/25/2009 2133   HDL 46  09/25/2009 2133   CHOLHDL 3.2 Ratio 09/25/2009 2133   VLDL 14 09/25/2009 2133   LDLCALC 89 09/25/2009 2133       Assessment and plan:   Patient Active Problem List   Diagnosis Date Noted  . Sinus bradycardia by electrocardiogram 08/17/2013  . Rash 07/19/2013  . Itching 07/19/2013   Rash She would need a skin biopsy Refer back to dermatology,nora communicated with the patient during this visit Repeat Bactrim and refill clobetasol and triamcinolone   Seasonal allergies Refill Flonase and hydroxyzine  Reschedule mammogram     Follow up in 3 months  The patient was given clear instructions to go to ER or return to medical center if symptoms don't improve, worsen or new problems develop. The patient verbalized understanding. The patient was told to call to get any lab results if not heard anything in the next week.

## 2013-09-15 NOTE — Patient Instructions (Signed)
Patient instructed to take Tylenol or Motrin for left-sided musculoskeletal chest pain

## 2013-10-18 ENCOUNTER — Ambulatory Visit: Payer: No Typology Code available for payment source | Attending: Internal Medicine | Admitting: Internal Medicine

## 2013-10-18 ENCOUNTER — Encounter: Payer: Self-pay | Admitting: Internal Medicine

## 2013-10-18 VITALS — BP 119/69 | HR 77 | Temp 98.2°F | Resp 16 | Wt 109.6 lb

## 2013-10-18 DIAGNOSIS — Z853 Personal history of malignant neoplasm of breast: Secondary | ICD-10-CM | POA: Insufficient documentation

## 2013-10-18 DIAGNOSIS — R21 Rash and other nonspecific skin eruption: Secondary | ICD-10-CM

## 2013-10-18 DIAGNOSIS — L259 Unspecified contact dermatitis, unspecified cause: Secondary | ICD-10-CM | POA: Insufficient documentation

## 2013-10-18 DIAGNOSIS — Z1211 Encounter for screening for malignant neoplasm of colon: Secondary | ICD-10-CM

## 2013-10-18 DIAGNOSIS — Z923 Personal history of irradiation: Secondary | ICD-10-CM | POA: Insufficient documentation

## 2013-10-18 DIAGNOSIS — K219 Gastro-esophageal reflux disease without esophagitis: Secondary | ICD-10-CM | POA: Insufficient documentation

## 2013-10-18 DIAGNOSIS — F17200 Nicotine dependence, unspecified, uncomplicated: Secondary | ICD-10-CM

## 2013-10-18 DIAGNOSIS — J301 Allergic rhinitis due to pollen: Secondary | ICD-10-CM | POA: Insufficient documentation

## 2013-10-18 DIAGNOSIS — J4489 Other specified chronic obstructive pulmonary disease: Secondary | ICD-10-CM

## 2013-10-18 DIAGNOSIS — F1010 Alcohol abuse, uncomplicated: Secondary | ICD-10-CM | POA: Insufficient documentation

## 2013-10-18 DIAGNOSIS — J449 Chronic obstructive pulmonary disease, unspecified: Secondary | ICD-10-CM

## 2013-10-18 DIAGNOSIS — Z139 Encounter for screening, unspecified: Secondary | ICD-10-CM

## 2013-10-18 DIAGNOSIS — Z780 Asymptomatic menopausal state: Secondary | ICD-10-CM | POA: Insufficient documentation

## 2013-10-18 DIAGNOSIS — H6120 Impacted cerumen, unspecified ear: Secondary | ICD-10-CM

## 2013-10-18 LAB — TSH: TSH: 1.116 u[IU]/mL (ref 0.350–4.500)

## 2013-10-18 LAB — COMPLETE METABOLIC PANEL WITH GFR
ALBUMIN: 4 g/dL (ref 3.5–5.2)
ALT: 10 U/L (ref 0–35)
AST: 19 U/L (ref 0–37)
Alkaline Phosphatase: 82 U/L (ref 39–117)
BUN: 8 mg/dL (ref 6–23)
CHLORIDE: 104 meq/L (ref 96–112)
CO2: 26 meq/L (ref 19–32)
Calcium: 9.6 mg/dL (ref 8.4–10.5)
Creat: 0.76 mg/dL (ref 0.50–1.10)
GFR, Est African American: >89 mL/min
GFR, Est Non African American: 87 mL/min
Glucose, Bld: 85 mg/dL (ref 70–99)
POTASSIUM: 5 meq/L (ref 3.5–5.3)
Sodium: 139 mEq/L (ref 135–145)
TOTAL PROTEIN: 6.9 g/dL (ref 6.0–8.3)
Total Bilirubin: 0.2 mg/dL (ref 0.2–1.2)

## 2013-10-18 LAB — CBC WITH DIFFERENTIAL/PLATELET
BASOS PCT: 1 % (ref 0–1)
Basophils Absolute: 0.1 10*3/uL (ref 0.0–0.1)
EOS ABS: 0.4 10*3/uL (ref 0.0–0.7)
EOS PCT: 8 % — AB (ref 0–5)
HCT: 38.7 % (ref 36.0–46.0)
Hemoglobin: 13.4 g/dL (ref 12.0–15.0)
LYMPHS ABS: 2.7 10*3/uL (ref 0.7–4.0)
Lymphocytes Relative: 49 % — ABNORMAL HIGH (ref 12–46)
MCH: 33.2 pg (ref 26.0–34.0)
MCHC: 34.6 g/dL (ref 30.0–36.0)
MCV: 95.8 fL (ref 78.0–100.0)
Monocytes Absolute: 0.4 10*3/uL (ref 0.1–1.0)
Monocytes Relative: 8 % (ref 3–12)
Neutro Abs: 1.9 10*3/uL (ref 1.7–7.7)
Neutrophils Relative %: 34 % — ABNORMAL LOW (ref 43–77)
Platelets: 348 10*3/uL (ref 150–400)
RBC: 4.04 MIL/uL (ref 3.87–5.11)
RDW: 13.1 % (ref 11.5–15.5)
WBC: 5.5 10*3/uL (ref 4.0–10.5)

## 2013-10-18 MED ORDER — NICOTINE 21 MG/24HR TD PT24: 21.0000 mg | MEDICATED_PATCH | Freq: Every day | TRANSDERMAL | Status: DC

## 2013-10-18 MED ORDER — CARBAMIDE PEROXIDE 6.5 % OT SOLN: 5.0000 [drp] | Freq: Two times a day (BID) | OTIC | Status: DC

## 2013-10-18 NOTE — Progress Notes (Signed)
Patient states she is here for follow up on her rash Rash is now back to her right arm

## 2013-10-18 NOTE — Progress Notes (Signed)
MRN: [id number] Name: [name]  Sex: female Age: 58 y.o. DOB: Mar 16, 1956  Allergies: Penicillins  Chief Complaint  Patient presents with  . Rash    HPI: Patient is 58 y.o. female who who has history of asthma/COPD, chronic eczema, GERD comes today for followup, patient is in the process to schedule appointment with dermatology for eczema and is using the topical creams, she is on Flovent as well as albuterol when necessary, denies any worsening symptoms but does smoke cigarettes everyday, I have advised patient to quit smoking, as per patient several years ago she had a mammogram done also has not had colonoscopy done yet. Patient has not had any recent blood work done. Patient also reported to have lot of fullness in the ears denies any discharge fever chills or sore throat.   Past Medical History  Diagnosis Date  . Acid reflux   . Asthma   . COPD (chronic obstructive pulmonary disease)   . Cancer     lumpectomy, radiation 2009  . Seasonal allergies   . Alcohol abuse   . Elevated d-dimer     negative chest CT    Past Surgical History  Procedure Laterality Date  . Breast lumpectomy    . Abdominal hysterectomy        Medication List       This list is accurate as of: 10/18/13  2:38 PM.  Always use your most recent med list.               albuterol 108 (90 BASE) MCG/ACT inhaler  Commonly known as:  PROVENTIL HFA;VENTOLIN HFA  Inhale 2 puffs into the lungs every 6 (six) hours as needed for wheezing or shortness of breath. For shortness of breath     Bacitracin-Pramoxine HCl 500-10 UNIT-MG/GM Oint  Apply 1 application topically 2 (two) times daily.     carbamide peroxide 6.5 % otic solution  Commonly known as:  DEBROX  Place 5 drops into both ears 2 (two) times daily.     Clobetasol Prop Emollient Base 0.05 % emollient cream  Apply 1 application topically 2 (two) times daily. until symptoms resolve. Use scalp applicator     fluticasone 220 MCG/ACT inhaler   Commonly known as:  FLOVENT HFA  Inhale 1 puff into the lungs 2 (two) times daily.     hydrOXYzine 25 MG tablet  Commonly known as:  ATARAX/VISTARIL  Take 1 tablet (25 mg total) by mouth every 8 (eight) hours as needed for itching.     loratadine 10 MG tablet  Commonly known as:  CLARITIN  Take 1 tablet (10 mg total) by mouth daily.     methylPREDNISolone 4 MG tablet  Commonly known as:  MEDROL DOSEPAK  As directed     nicotine 21 mg/24hr patch  Commonly known as:  NICODERM CQ  Place 1 patch (21 mg total) onto the skin daily.     pantoprazole 40 MG tablet  Commonly known as:  PROTONIX  Take 1 tablet (40 mg total) by mouth daily.     predniSONE 10 MG tablet  Commonly known as:  DELTASONE  Take 4 tabs daily for 4 days, 3 tabs daily for 4 days, 2 tabs daily for 4 days, then 1 tab daily for 4 days.     predniSONE 10 MG tablet  Commonly known as:  DELTASONE  4 tabs daily for 5 days, 3 tabs daily for 4 days, 2 tabs daily for 3 days, 1 tab daily for  2 days     silver sulfADIAZINE 1 % cream  Commonly known as:  SILVADENE  Apply topically 2 (two) times daily.     sulfamethoxazole-trimethoprim 800-160 MG per tablet  Commonly known as:  BACTRIM DS  Take 1 tablet by mouth 2 (two) times daily.     triamcinolone cream 0.1 %  Commonly known as:  KENALOG  Apply topically 2 (two) times daily.        Meds ordered this encounter  Medications  . nicotine (NICODERM CQ) 21 mg/24hr patch    Sig: Place 1 patch (21 mg total) onto the skin daily.    Dispense:  28 patch    Refill:  0  . carbamide peroxide (DEBROX) 6.5 % otic solution    Sig: Place 5 drops into both ears 2 (two) times daily.    Dispense:  15 mL    Refill:  1    Immunization History  Administered Date(s) Administered  . Influenza,inj,Quad PF,36+ Mos 07/19/2013  . Tdap 07/19/2013    Family History  Problem Relation Age of Onset  . Hypertension Other     History  Substance Use Topics  . Smoking status:  Current Every Day Smoker -- 1.00 packs/day    Types: Cigarettes  . Smokeless tobacco: Not on file  . Alcohol Use: Yes     Comment: occ beer    Review of Systems   As noted in HPI  Filed Vitals:   10/18/13 1415  BP: 119/69  Pulse: 77  Temp: 98.2 F (36.8 C)  Resp: 16    Physical Exam  Physical Exam  Constitutional: She is oriented to person, place, and time.  HENT:  Increased wax in both ears  Eyes: EOM are normal. Pupils are equal, round, and reactive to light.  Cardiovascular: Normal rate and regular rhythm.   Pulmonary/Chest: No respiratory distress. She has no wheezes. She has no rales.  Musculoskeletal: She exhibits no edema.  Neurological: She is alert and oriented to person, place, and time.  Skin:  Dry excoriated scaly  rash more prominent  on right forearm    CBC    Component Value Date/Time   WBC 7.3 12/03/2012 1601   WBC 6.2 12/28/2007 1507   RBC 4.36 12/03/2012 1601   RBC 3.60* 12/28/2007 1507   RBC 3.61* 07/05/2007 0630   HGB 14.4 12/03/2012 1601   HGB 13.1 12/28/2007 1507   HCT 43.4 12/03/2012 1601   HCT 37.8 12/28/2007 1507   PLT 271 12/03/2012 1601   PLT 197 12/28/2007 1507   MCV 99.5 12/03/2012 1601   MCV 104.8* 12/28/2007 1507   LYMPHSABS 2.5 12/03/2012 1601   LYMPHSABS 2.6 12/28/2007 1507   MONOABS 0.4 12/03/2012 1601   MONOABS 0.4 12/28/2007 1507   EOSABS 0.1 12/03/2012 1601   EOSABS 0.1 12/28/2007 1507   BASOSABS 0.0 12/03/2012 1601   BASOSABS 0.0 12/28/2007 1507    CMP     Component Value Date/Time   NA 141 12/03/2012 1601   K 4.5 12/03/2012 1601   CL 106 12/03/2012 1601   CO2 26 12/03/2012 1601   GLUCOSE 99 12/03/2012 1601   BUN 14 12/03/2012 1601   CREATININE 0.81 12/03/2012 1601   CREATININE 0.56 02/25/2012 0853   CALCIUM 9.4 12/03/2012 1601   PROT 6.7 12/03/2012 1601   ALBUMIN 3.8 12/03/2012 1601   AST 15 12/03/2012 1601   ALT 11 12/03/2012 1601   ALKPHOS 80 12/03/2012 1601   BILITOT 0.4 12/03/2012 1601   GFRNONAA >  90 02/25/2012 0853   GFRAA >90  02/25/2012 0853    Lab Results  Component Value Date/Time   CHOL 149 09/25/2009  9:33 PM    No components found with this basename: hga1c    Lab Results  Component Value Date/Time   AST 15 12/03/2012  4:01 PM    Assessment and Plan  COPD (chronic obstructive pulmonary disease) Symptoms are stable continue with Flovent and albuterol when necessary, advise patient to quit smoking she is going to try nicotine patch. Smoking - Plan: nicotine (NICODERM CQ) 21 mg/24hr patch  Rash Continue with topical creams patient is in the process to schedule appointment with dermatology.  Screening - Plan: MM DIGITAL SCREENING BILATERAL, ordered baseline blood work TSH, Vit D  25 hydroxy (rtn osteoporosis monitoring), COMPLETE METABOLIC PANEL WITH GFR, CBC with Differential  Special screening for malignant neoplasms, colon - Plan: Ambulatory referral to Gastroenterology  Excess wax in ear - Plan: carbamide peroxide (DEBROX) 6.5 % otic solution   Health Maintenance -Colonoscopy: Referral done  -Mammogram: Ordered   Return in about 4 months (around 02/18/2014) for COPD.  Lorayne Marek, MD

## 2013-10-19 ENCOUNTER — Telehealth: Payer: Self-pay

## 2013-10-19 ENCOUNTER — Telehealth: Payer: Self-pay | Admitting: *Deleted

## 2013-10-19 DIAGNOSIS — E559 Vitamin D deficiency, unspecified: Secondary | ICD-10-CM

## 2013-10-19 LAB — VITAMIN D 25 HYDROXY (VIT D DEFICIENCY, FRACTURES): Vit D, 25-Hydroxy: 14 ng/mL — ABNORMAL LOW (ref 30–89)

## 2013-10-19 MED ORDER — VITAMIN D (ERGOCALCIFEROL) 1.25 MG (50000 UNIT) PO CAPS: 50000.0000 [IU] | ORAL_CAPSULE | ORAL | Status: DC

## 2013-10-19 NOTE — Telephone Encounter (Signed)
Message copied by Joan Mayans on Wed Oct 19, 2013 10:01 AM ------      Message from: Lorayne Marek      Created: Wed Oct 19, 2013  9:15 AM       Blood work reviewed, noticed low vitamin D, call patient advise to start ergocalciferol 50,000 units once a week for the duration of  12 weeks.       ------

## 2013-10-19 NOTE — Telephone Encounter (Signed)
Pt is aware of her lab results and her new rx

## 2013-10-19 NOTE — Telephone Encounter (Signed)
Message copied by Dorothe Pea on Wed Oct 19, 2013  2:20 PM ------      Message from: Lorayne Marek      Created: Wed Oct 19, 2013  9:15 AM       Blood work reviewed, noticed low vitamin D, call patient advise to start ergocalciferol 50,000 units once a week for the duration of  12 weeks.       ------

## 2013-11-02 ENCOUNTER — Telehealth: Payer: Self-pay | Admitting: Internal Medicine

## 2013-11-02 NOTE — Telephone Encounter (Signed)
Pt calling regarding refill for allergy medication, says allergies have been flaring recently.  Please f/u with pt.

## 2013-11-03 ENCOUNTER — Telehealth: Payer: Self-pay

## 2013-11-03 MED ORDER — LORATADINE 10 MG PO TABS: 10.0000 mg | ORAL_TABLET | Freq: Every day | ORAL | Status: DC

## 2013-11-14 ENCOUNTER — Other Ambulatory Visit: Payer: Self-pay | Admitting: Cardiology

## 2013-11-15 ENCOUNTER — Other Ambulatory Visit: Payer: Self-pay

## 2013-11-15 MED ORDER — PANTOPRAZOLE SODIUM 40 MG PO TBEC: 40.0000 mg | DELAYED_RELEASE_TABLET | Freq: Every day | ORAL | Status: DC

## 2013-11-18 ENCOUNTER — Encounter: Payer: Self-pay | Admitting: Internal Medicine

## 2013-12-02 ENCOUNTER — Other Ambulatory Visit (HOSPITAL_COMMUNITY): Payer: Self-pay | Admitting: *Deleted

## 2013-12-02 DIAGNOSIS — Z853 Personal history of malignant neoplasm of breast: Secondary | ICD-10-CM

## 2013-12-06 ENCOUNTER — Ambulatory Visit (HOSPITAL_COMMUNITY)
Admission: RE | Admit: 2013-12-06 | Discharge: 2013-12-06 | Disposition: A | Discharge status: Home / Self Care | Payer: No Typology Code available for payment source | Source: Ambulatory Visit | Attending: Obstetrics and Gynecology | Admitting: Obstetrics and Gynecology

## 2013-12-06 ENCOUNTER — Encounter (HOSPITAL_COMMUNITY): Payer: Self-pay

## 2013-12-06 VITALS — BP 126/78 | Temp 98.0°F | Ht 59.0 in | Wt 107.6 lb

## 2013-12-06 DIAGNOSIS — Z90710 Acquired absence of both cervix and uterus: Secondary | ICD-10-CM | POA: Insufficient documentation

## 2013-12-06 DIAGNOSIS — N63 Unspecified lump in unspecified breast: Secondary | ICD-10-CM | POA: Insufficient documentation

## 2013-12-06 DIAGNOSIS — Z1239 Encounter for other screening for malignant neoplasm of breast: Secondary | ICD-10-CM

## 2013-12-06 DIAGNOSIS — N644 Mastodynia: Secondary | ICD-10-CM | POA: Insufficient documentation

## 2013-12-06 DIAGNOSIS — Z853 Personal history of malignant neoplasm of breast: Secondary | ICD-10-CM | POA: Insufficient documentation

## 2013-12-06 DIAGNOSIS — N6310 Unspecified lump in the right breast, unspecified quadrant: Secondary | ICD-10-CM | POA: Insufficient documentation

## 2013-12-06 NOTE — Progress Notes (Signed)
Complaints of left sharp breast pain x 1 year that hurts at times. Patient rates pain at a 9 out of 10. Patient has a history of breast cancer and left breast lumpectomy in 2009.  Pap Smear:  Pap smear not completed today. Last Pap smear was 10/30/2009 and normal. Per patient has no history of an abnormal Pap smear. Patient has a history of a hysterectomy in the late 1970's or early 1980's due to benign reasons. Patient no longer needs Pap smears per BCCCP and ACOG guidelines. Last Pap smear result is in EPIC.  Physical exam: Breasts Right breast larger than left breast. No skin abnormalities right breast. Scar above left nipple due to history of lumpectomy in 2009. No nipple retraction bilateral breasts. No nipple discharge bilateral breasts. No lymphadenopathy. Palpated a thickened are of breast tissue vs. Lump at 12 o'clock above areola. Palpated a mass within the right upper outer quadrant of breast. Patient complained of tenderness when palpated mass. Referred patient to the [facility] for diagnostic mammogram. Appointment scheduled for Tuesday, December 13, 2013 at 1315.        Pelvic/Bimanual No Pap smear completed today since patient has a history of a hysterectomy for benign reasons. Pap smear not indicated per BCCCP guidelines.

## 2013-12-06 NOTE — Assessment & Plan Note (Signed)
Right upper outer quadrant breast mass. Referred patient to the [facility] for diagnostic mammogram. Appointment scheduled for Tuesday, December 13, 2013 at 1315.

## 2013-12-06 NOTE — Patient Instructions (Signed)
Taught Ourania Hamler how to perform BSE and gave educational materials to take home. Patient did not need a Pap smear today due to has a history of a hysterectomy for benign reasons. Let patient know that she does not need any further Pap smears due to her history of a hysterectomy for benign reasons. Referred patient to the [facility] for diagnostic mammogram. Appointment scheduled for Tuesday, December 13, 2013 at 1315. Patient aware of appointment and will be there. Smoking Cessation discussed with patient. Niylah Hassan verbalized understanding.  [place], Arvil Chaco, RN 1:21 PM

## 2013-12-13 ENCOUNTER — Other Ambulatory Visit (HOSPITAL_COMMUNITY): Payer: Self-pay | Admitting: Obstetrics and Gynecology

## 2013-12-13 ENCOUNTER — Ambulatory Visit
Admission: RE | Admit: 2013-12-13 | Discharge: 2013-12-13 | Disposition: A | Discharge status: Home / Self Care | Payer: No Typology Code available for payment source | Source: Ambulatory Visit | Attending: Obstetrics and Gynecology | Admitting: Obstetrics and Gynecology

## 2013-12-13 DIAGNOSIS — N6310 Unspecified lump in the right breast, unspecified quadrant: Secondary | ICD-10-CM

## 2013-12-13 DIAGNOSIS — Z853 Personal history of malignant neoplasm of breast: Secondary | ICD-10-CM

## 2013-12-21 ENCOUNTER — Ambulatory Visit
Admission: RE | Admit: 2013-12-21 | Discharge: 2013-12-21 | Disposition: A | Discharge status: Home / Self Care | Payer: No Typology Code available for payment source | Source: Ambulatory Visit | Attending: Obstetrics and Gynecology | Admitting: Obstetrics and Gynecology

## 2013-12-21 ENCOUNTER — Other Ambulatory Visit (HOSPITAL_COMMUNITY): Payer: Self-pay | Admitting: Obstetrics and Gynecology

## 2013-12-21 DIAGNOSIS — N6310 Unspecified lump in the right breast, unspecified quadrant: Secondary | ICD-10-CM

## 2013-12-22 ENCOUNTER — Other Ambulatory Visit (INDEPENDENT_AMBULATORY_CARE_PROVIDER_SITE_OTHER): Payer: Self-pay | Admitting: General Surgery

## 2013-12-22 DIAGNOSIS — C50919 Malignant neoplasm of unspecified site of unspecified female breast: Secondary | ICD-10-CM

## 2013-12-23 ENCOUNTER — Encounter: Payer: Self-pay | Admitting: *Deleted

## 2013-12-23 ENCOUNTER — Other Ambulatory Visit: Payer: No Typology Code available for payment source

## 2013-12-23 ENCOUNTER — Telehealth: Payer: Self-pay | Admitting: *Deleted

## 2013-12-23 DIAGNOSIS — C50411 Malignant neoplasm of upper-outer quadrant of right female breast: Secondary | ICD-10-CM | POA: Insufficient documentation

## 2013-12-23 NOTE — Telephone Encounter (Signed)
Confirmed [facility] for 12/28/13 at 1200.  Instructions and contact information given. 

## 2013-12-26 ENCOUNTER — Other Ambulatory Visit: Payer: No Typology Code available for payment source

## 2013-12-26 ENCOUNTER — Ambulatory Visit
Admission: RE | Admit: 2013-12-26 | Discharge: 2013-12-26 | Disposition: A | Discharge status: Home / Self Care | Payer: No Typology Code available for payment source | Source: Ambulatory Visit | Attending: General Surgery | Admitting: General Surgery

## 2013-12-26 DIAGNOSIS — C50919 Malignant neoplasm of unspecified site of unspecified female breast: Secondary | ICD-10-CM

## 2013-12-26 MED ORDER — GADOBENATE DIMEGLUMINE 529 MG/ML IV SOLN
10.0000 mL | Freq: Once | INTRAVENOUS | Status: AC | PRN
  Administered 2013-12-26: 10 mL via INTRAVENOUS

## 2013-12-28 ENCOUNTER — Encounter (HOSPITAL_BASED_OUTPATIENT_CLINIC_OR_DEPARTMENT_OTHER): Payer: Self-pay | Admitting: *Deleted

## 2013-12-28 ENCOUNTER — Encounter: Payer: Self-pay | Admitting: Oncology

## 2013-12-28 ENCOUNTER — Other Ambulatory Visit (HOSPITAL_BASED_OUTPATIENT_CLINIC_OR_DEPARTMENT_OTHER): Payer: Medicaid Other

## 2013-12-28 ENCOUNTER — Ambulatory Visit: Payer: Medicaid Other | Attending: General Surgery | Admitting: Physical Therapy

## 2013-12-28 ENCOUNTER — Ambulatory Visit (HOSPITAL_BASED_OUTPATIENT_CLINIC_OR_DEPARTMENT_OTHER): Payer: Medicaid Other | Admitting: General Surgery

## 2013-12-28 ENCOUNTER — Ambulatory Visit (HOSPITAL_BASED_OUTPATIENT_CLINIC_OR_DEPARTMENT_OTHER): Payer: Medicaid Other | Admitting: Oncology

## 2013-12-28 ENCOUNTER — Ambulatory Visit: Payer: No Typology Code available for payment source

## 2013-12-28 ENCOUNTER — Encounter (INDEPENDENT_AMBULATORY_CARE_PROVIDER_SITE_OTHER): Payer: Self-pay

## 2013-12-28 ENCOUNTER — Encounter (INDEPENDENT_AMBULATORY_CARE_PROVIDER_SITE_OTHER): Payer: Self-pay | Admitting: General Surgery

## 2013-12-28 ENCOUNTER — Encounter: Payer: Self-pay | Admitting: Radiation Oncology

## 2013-12-28 ENCOUNTER — Encounter: Payer: Self-pay | Admitting: General Practice

## 2013-12-28 ENCOUNTER — Telehealth: Payer: Self-pay | Admitting: Oncology

## 2013-12-28 ENCOUNTER — Ambulatory Visit
Admission: RE | Admit: 2013-12-28 | Discharge: 2013-12-28 | Disposition: A | Discharge status: Home / Self Care | Payer: No Typology Code available for payment source | Source: Ambulatory Visit | Attending: Radiation Oncology | Admitting: Radiation Oncology

## 2013-12-28 VITALS — BP 133/63 | HR 62 | Temp 98.4°F | Resp 18 | Ht 59.0 in | Wt 104.6 lb

## 2013-12-28 DIAGNOSIS — C50411 Malignant neoplasm of upper-outer quadrant of right female breast: Secondary | ICD-10-CM

## 2013-12-28 DIAGNOSIS — C50919 Malignant neoplasm of unspecified site of unspecified female breast: Secondary | ICD-10-CM | POA: Insufficient documentation

## 2013-12-28 DIAGNOSIS — C773 Secondary and unspecified malignant neoplasm of axilla and upper limb lymph nodes: Secondary | ICD-10-CM

## 2013-12-28 DIAGNOSIS — C50419 Malignant neoplasm of upper-outer quadrant of unspecified female breast: Secondary | ICD-10-CM

## 2013-12-28 DIAGNOSIS — M259 Joint disorder, unspecified: Secondary | ICD-10-CM | POA: Diagnosis not present

## 2013-12-28 DIAGNOSIS — J438 Other emphysema: Secondary | ICD-10-CM | POA: Insufficient documentation

## 2013-12-28 DIAGNOSIS — Z171 Estrogen receptor negative status [ER-]: Secondary | ICD-10-CM

## 2013-12-28 DIAGNOSIS — R293 Abnormal posture: Secondary | ICD-10-CM | POA: Insufficient documentation

## 2013-12-28 DIAGNOSIS — IMO0001 Reserved for inherently not codable concepts without codable children: Secondary | ICD-10-CM | POA: Insufficient documentation

## 2013-12-28 DIAGNOSIS — Z9010 Acquired absence of unspecified breast and nipple: Secondary | ICD-10-CM | POA: Insufficient documentation

## 2013-12-28 DIAGNOSIS — Z923 Personal history of irradiation: Secondary | ICD-10-CM | POA: Insufficient documentation

## 2013-12-28 DIAGNOSIS — F17200 Nicotine dependence, unspecified, uncomplicated: Secondary | ICD-10-CM

## 2013-12-28 LAB — CBC WITH DIFFERENTIAL/PLATELET
BASO%: 0.7 % (ref 0.0–2.0)
Basophils Absolute: 0.1 10*3/uL (ref 0.0–0.1)
EOS ABS: 0.5 10*3/uL (ref 0.0–0.5)
EOS%: 6.2 % (ref 0.0–7.0)
HEMATOCRIT: 43.1 % (ref 34.8–46.6)
HGB: 14 g/dL (ref 11.6–15.9)
LYMPH#: 2.5 10*3/uL (ref 0.9–3.3)
LYMPH%: 31 % (ref 14.0–49.7)
MCH: 31.8 pg (ref 25.1–34.0)
MCHC: 32.4 g/dL (ref 31.5–36.0)
MCV: 98.1 fL (ref 79.5–101.0)
MONO#: 0.6 10*3/uL (ref 0.1–0.9)
MONO%: 7.3 % (ref 0.0–14.0)
NEUT#: 4.4 10*3/uL (ref 1.5–6.5)
NEUT%: 54.8 % (ref 38.4–76.8)
Platelets: 224 10*3/uL (ref 145–400)
RBC: 4.39 10*6/uL (ref 3.70–5.45)
RDW: 13.1 % (ref 11.2–14.5)
WBC: 8 10*3/uL (ref 3.9–10.3)

## 2013-12-28 LAB — COMPREHENSIVE METABOLIC PANEL (CC13)
ALBUMIN: 3.9 g/dL (ref 3.5–5.0)
ALT: 9 U/L (ref 0–55)
ANION GAP: 8 meq/L (ref 3–11)
AST: 26 U/L (ref 5–34)
Alkaline Phosphatase: 96 U/L (ref 40–150)
BUN: 6.1 mg/dL — AB (ref 7.0–26.0)
CALCIUM: 9.2 mg/dL (ref 8.4–10.4)
CHLORIDE: 109 meq/L (ref 98–109)
CO2: 20 mEq/L — ABNORMAL LOW (ref 22–29)
CREATININE: 0.8 mg/dL (ref 0.6–1.1)
GLUCOSE: 101 mg/dL (ref 70–140)
POTASSIUM: 5.2 meq/L — AB (ref 3.5–5.1)
Sodium: 137 mEq/L (ref 136–145)
Total Bilirubin: 0.2 mg/dL (ref 0.20–1.20)
Total Protein: 7.4 g/dL (ref 6.4–8.3)

## 2013-12-28 NOTE — Progress Notes (Signed)
Talked with daughter with mom there-mom does not read or write well-has some learning problems-talk with daughter-labs done-she will bring her for surgery

## 2013-12-28 NOTE — Telephone Encounter (Signed)
per pof to sch appt-adv pt that CS will call w/ MRI sch time & date-pt understood

## 2013-12-28 NOTE — Progress Notes (Signed)
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  She is concerned about pain mgmt, ability to continue to do her job, and how to handle healthcare expenses.    [name], [place]

## 2013-12-28 NOTE — Progress Notes (Signed)
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 The patient then had routine yearly mammography until 10/24/2009.  Sometime in 2014 the patient tells me she noted a mass in her right breast. She states she brought this to medical attention several times, although I do not see that reflected in the outside notes we have. In any case on 12/06/2013 the patient presented to her primary clinic with a complaint of left-sided breast pain. Exam showed the right breast however to be larger than the left. There was a thickened area versus lump at the 12:00 position in the right breast above the areola. There was also a mass in the right upper quadrant of the right breast. The patient was referred to the breast Center and on 6:30 at 2015 bilateral diagnostic mammography and right breast ultrasonography at the breast Center showed a 6 cm area in the right breast upper outer quadrant with increased density associated with branching calcifications. There was a separate area more medially that also appeared abnormal. There was diffuse skin thickening and interval enlarged right axillary lymph nodes. On exam the mammographer was able to palpate a 5 cm firm fixed palpable mass in the upper-outer  quadrant of the right breast, but mild skin redness. There was also a palpable right axillary lymph node. Ultrasound confirmed a large irregular hypoechoic mass at the 11:00 position of the right breast measuring 4.1 cm maximally. More laterally a separate mass in the 9:00 position measure 4.4 cm maximally. In the right axilla there were multiple abnormal appearing axillary lymph nodes.  Biopsy of both these suspicious masses in the right breast as well as one of the suspicious lymph nodes 12/21/2013 showed ([id number]) all 3 biopsies do show identical invasive ductal carcinoma, grade 3, triple negative, with an MIB-1 of 85%. On 12/26/2013 the patient underwent bilateral breast MRI. This showed an abnormal area measuring 7.2 cm in the central right breast involving or 4 quadrants and centered superiorly, with diffuse skin thickening as well as level I and 2 metastatic right axillary adenopathy. There was also an enlarged right internal mammary lymph node. The left side was unremarkable.  The patient's subsequent history is as detailed below  INTERVAL HISTORY: [name] was evaluated in the multidisciplinary breast cancer clinic 12/28/2013 accompanied by her daughter [name]  REVIEW OF SYSTEMS: Aside from pain in the right breast, which is intermittent, the patient denies discharge or unusual swelling. She continues to smoke. She has night sweats. She has lost some weight but cannot tell me how much. She sleeps poorly. Sometimes she has blurred vision. She is losing her hearing and she has ringing in her ears. She has chronic sinus problems. Her teeth are in poor repair. She has emphysema and she is short of breath sometimes even at rest. She has a dry cough most of the time, but denies  hemoptysis or-appearing sputum. Her appetite is poor. She has occasional heartburn. Her stools have turned lighter. She has a skin rash that has been previously evaluated by dermatology. She has arthritis with joint  pain which is not more frequent or intense than before. She has had some headaches. She is experiencing hot flashes. A detailed review of systems today was otherwise stable  PAST MEDICAL HISTORY: Past Medical History  Diagnosis Date  . Acid reflux   . Asthma   . COPD (chronic obstructive pulmonary disease)   . Cancer     lumpectomy, radiation 2009  . Seasonal allergies   . Alcohol abuse   . Elevated d-dimer     negative chest CT  . Allergy   . Emphysema of lung   . Hot flashes     PAST SURGICAL HISTORY: Past Surgical History  Procedure Laterality Date  . Breast lumpectomy    . Abdominal hysterectomy      FAMILY HISTORY Family History  Problem Relation Age of Onset  . Hypertension Other    the patient has little information about her father or his side of the family. The patient's mother died at the age of 73. The patient had 2 brothers and 4 sisters. There is no history of breast or ovarian cancer in the family to her knowledge.  GYNECOLOGIC HISTORY:  No LMP recorded. Patient has had a hysterectomy. The patient does not remember when she started menstruating. First live birth age 64. She is GX P1. She underwent hysterectomy approximately 12 years ago, but does not know whether she had bilateral salpingo-oophorectomy. 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 She is married but separated from her husband [name], who lives in [place]. The patient lives by herself, with no pets. Her daughter [name] works as a Stage manager for [employer] care in [place] the patient has 3 grandchildren and 2 great-grandchildren. She is a Psychologist, forensic.    ADVANCED DIRECTIVES: Not in place; at her 12/28/2013 visit the patient was given the appropriate documents to complete and notarize so she may declare a healthcare power of attorney at her discretion   HEALTH MAINTENANCE: History  Substance  Use Topics  . Smoking status: Current Every Day Smoker -- 1.00 packs/day for 30 years    Types: Cigarettes  . Smokeless tobacco: Not on file  . Alcohol Use: Yes     Comment: occ beer     Colonoscopy: Never  PAP: Status post hysterectomy  Bone density: Never  Lipid panel:  Allergies  Allergen Reactions  . Penicillins Other (See Comments)    Reaction a long time ago    Current Outpatient Prescriptions  Medication Sig Dispense Refill  . albuterol (PROVENTIL HFA;VENTOLIN HFA) 108 (90 BASE) MCG/ACT inhaler Inhale 2 puffs into the lungs every 6 (six) hours as needed for wheezing or shortness of breath. For shortness of breath  3 Inhaler  3  . Bacitracin-Pramoxine HCl 500-10 UNIT-MG/GM OINT Apply 1 application topically 2 (two) times daily.  28 g  0  . carbamide peroxide (DEBROX) 6.5 % otic solution Place 5 drops into both ears 2 (two) times daily.  15 mL  1  . Clobetasol Prop Emollient Base 0.05 % emollient cream Apply 1 application topically 2 (two) times daily. until symptoms resolve. Use scalp applicator  30 g  5  . fluticasone (FLOVENT HFA) 220 MCG/ACT inhaler Inhale 1 puff into the lungs 2 (two) times  daily.  3 Inhaler  3  . hydrOXYzine (ATARAX/VISTARIL) 25 MG tablet Take 1 tablet (25 mg total) by mouth every 8 (eight) hours as needed for itching.  60 tablet  2  . loratadine (CLARITIN) 10 MG tablet Take 1 tablet (10 mg total) by mouth daily.  30 tablet  10  . methylPREDNISolone (MEDROL DOSEPAK) 4 MG tablet As directed  21 tablet  0  . nicotine (NICODERM CQ) 21 mg/24hr patch Place 1 patch (21 mg total) onto the skin daily.  28 patch  0  . pantoprazole (PROTONIX) 40 MG tablet Take 1 tablet (40 mg total) by mouth daily.  30 tablet  2  . predniSONE (DELTASONE) 10 MG tablet Take 4 tabs daily for 4 days, 3 tabs daily for 4 days, 2 tabs daily for 4 days, then 1 tab daily for 4 days.  40 tablet  0  . predniSONE (DELTASONE) 10 MG tablet 4 tabs daily for 5 days, 3 tabs daily for 4 days, 2 tabs  daily for 3 days, 1 tab daily for 2 days  40 tablet  0  . silver sulfADIAZINE (SILVADENE) 1 % cream Apply topically 2 (two) times daily.  50 g  1  . sulfamethoxazole-trimethoprim (BACTRIM DS) 800-160 MG per tablet Take 1 tablet by mouth 2 (two) times daily.  20 tablet  0  . triamcinolone cream (KENALOG) 0.1 % Apply topically 2 (two) times daily.  80 g  5  . Vitamin D, Ergocalciferol, (DRISDOL) 50000 UNITS CAPS capsule Take 1 capsule (50,000 Units total) by mouth every 7 (seven) days.  12 capsule  0   No current facility-administered medications for this visit.    OBJECTIVE: Middle-aged white African American woman who appears older than stated age 58 Vitals:   12/28/13 1243  BP: 133/63  Pulse: 62  Temp: 98.4 F (36.9 C)  Resp: 18     Body mass index is 21.12 kg/(m^2).    ECOG FS:1 - Symptomatic but completely ambulatory  Ocular: Sclerae unicteric, pupils are round and equal Ear-nose-throat: Oropharynx clear, dentition in poor repair Lymphatic: No cervical or supraclavicular adenopathy Lungs no rales or rhonchi Heart regular rate and rhythm, no murmur appreciated Abd soft, nontender, positive bowel sounds MSK no focal spinal tenderness, no joint edema Neuro: non-focal, well-oriented, appropriate affect Breasts: The right breast is enlarged. There is an area of skin induration and change in the lateral aspect near the axilla. There is a palpable mass that is movable chiefly in the upper outer quadrant of the breast and measures more than 5 cm. There is no nipple discharge. There is palpable adenopathy in the right axilla. The left breast is status post lumpectomy. There is no evidence of recurrence. Left axilla is benign.   LAB RESULTS:  CMP     Component Value Date/Time   NA 137 12/28/2013 1229   NA 139 10/18/2013 1438   K 5.2* 12/28/2013 1229   K 5.0 10/18/2013 1438   CL 104 10/18/2013 1438   CO2 20* 12/28/2013 1229   CO2 26 10/18/2013 1438   GLUCOSE 101 12/28/2013 1229   GLUCOSE 85  10/18/2013 1438   BUN 6.1* 12/28/2013 1229   BUN 8 10/18/2013 1438   CREATININE 0.8 12/28/2013 1229   CREATININE 0.76 10/18/2013 1438   CREATININE 0.56 02/25/2012 0853   CALCIUM 9.2 12/28/2013 1229   CALCIUM 9.6 10/18/2013 1438   PROT 7.4 12/28/2013 1229   PROT 6.9 10/18/2013 1438   ALBUMIN 3.9 12/28/2013 1229  ALBUMIN 4.0 10/18/2013 1438   AST 26 12/28/2013 1229   AST 19 10/18/2013 1438   ALT 9 12/28/2013 1229   ALT 10 10/18/2013 1438   ALKPHOS 96 12/28/2013 1229   ALKPHOS 82 10/18/2013 1438   BILITOT 0.20 12/28/2013 1229   BILITOT 0.2 10/18/2013 1438   GFRNONAA 87 10/18/2013 1438   GFRNONAA >90 02/25/2012 0853   GFRAA >89 10/18/2013 1438   GFRAA >90 02/25/2012 0853    I No results found for this basename: SPEP, UPEP,  kappa and lambda light chains    Lab Results  Component Value Date   WBC 8.0 12/28/2013   NEUTROABS 4.4 12/28/2013   HGB 14.0 12/28/2013   HCT 43.1 12/28/2013   MCV 98.1 12/28/2013   PLT 224 12/28/2013      Chemistry      Component Value Date/Time   NA 137 12/28/2013 1229   NA 139 10/18/2013 1438   K 5.2* 12/28/2013 1229   K 5.0 10/18/2013 1438   CL 104 10/18/2013 1438   CO2 20* 12/28/2013 1229   CO2 26 10/18/2013 1438   BUN 6.1* 12/28/2013 1229   BUN 8 10/18/2013 1438   CREATININE 0.8 12/28/2013 1229   CREATININE 0.76 10/18/2013 1438   CREATININE 0.56 02/25/2012 0853      Component Value Date/Time   CALCIUM 9.2 12/28/2013 1229   CALCIUM 9.6 10/18/2013 1438   ALKPHOS 96 12/28/2013 1229   ALKPHOS 82 10/18/2013 1438   AST 26 12/28/2013 1229   AST 19 10/18/2013 1438   ALT 9 12/28/2013 1229   ALT 10 10/18/2013 1438   BILITOT 0.20 12/28/2013 1229   BILITOT 0.2 10/18/2013 1438       No results found for this basename: LABCA2    No components found with this basename: LABCA125    No results found for this basename: INR,  in the last 168 hours  Urinalysis    Component Value Date/Time   COLORURINE YELLOW 12/03/2012 [place] 12/03/2012 1601   LABSPEC 1.018 12/03/2012 1601   PHURINE 6.0  12/03/2012 1601   GLUCOSEU NEG 12/03/2012 1601   GLUCOSEU NEG mg/dL 07/09/2007 2022   HGBUR NEG 12/03/2012 1601   BILIRUBINUR NEG 12/03/2012 1601   KETONESUR NEG 12/03/2012 1601   PROTEINUR NEG 12/03/2012 1601   UROBILINOGEN 0.2 12/03/2012 1601   NITRITE NEG 12/03/2012 1601   LEUKOCYTESUR SMALL* 12/03/2012 1601    STUDIES: Mr Breast Bilateral W Wo Contrast  12/26/2013   CLINICAL DATA:  Recently diagnosed right breast invasive ductal carcinoma at two locations and metastatic right axillary lymph node. Status post left lumpectomy and radiation therapy for breast cancer in 2009.  LABS:  None obtained today.  EXAM: BILATERAL BREAST MRI WITH AND WITHOUT CONTRAST  TECHNIQUE: Multiplanar, multisequence MR images of both breasts were obtained prior to and following the intravenous administration of 72m of MultiHance.  THREE-DIMENSIONAL MR IMAGE RENDERING ON INDEPENDENT WORKSTATION:  Three-dimensional MR images were rendered by post-processing of the original MR data on an independent workstation. The three-dimensional MR images were interpreted, and findings are reported in the following complete MRI report for this study. Three dimensional images were evaluated at the independent DynaCad workstation  COMPARISON:  Recent mammogram, ultrasound and biopsy examinations.  FINDINGS: Breast composition: c:  Heterogeneous fibroglandular tissue  Background parenchymal enhancement: Minimal  Right breast: Large, irregular enhancing mass in the central right breast, involving all four quadrants, centered superiorly. This extends to the posterior aspect of the nipple  without evidence of extension into the nipple. This is not involving the pectoralis muscle. This measures 7.2 x 4.5 x 3.7 cm in maximum dimensions. This contains biopsy marker clip artifacts posteriorly and laterally. There is also diffuse right breast skin thickening throughout the upper half of the breast with associated abnormal skin enhancement.  Left breast: Post  lumpectomy changes with no mass or enhancement suspicious for malignancy.  Lymph nodes: Multiple abnormal appearing right axillary lymph nodes with loss of the normal fatty hila. These include level 1 nodes and a 10 mm short axis node extending posterior to the lateral aspect of the pectoralis minor muscle on inversion recovery image number 3. There is also an abnormal appearing right internal mammary lymph node with a short axis diameter of 5 mm on image number 59 of series 13.  Ancillary findings:  None.  IMPRESSION: 1. 7.2 x 4.5 x 3.7 cm biopsy-proven invasive ductal carcinoma in in the central right breast, involving all 4 quadrants, centered superiorly. 2. Diffuse skin thickening and enhancement in the superior half of the right breast, suspicious for dermal invasion of malignancy. 3. Level 1 and level 2 metastatic right axillary adenopathy. 4. Metastatic right internal mammary adenopathy. 5. Post lumpectomy changes on the left without evidence of recurrent malignancy in the left breast.  RECOMMENDATION: Treatment plan.  BI-RADS CATEGORY  6: Known biopsy-proven malignancy.   Electronically Signed   By: Enrique Sack M.D.   On: 12/26/2013 15:33   Mm Digital Diagnostic Bilat  12/13/2013   CLINICAL DATA:  Status post left lumpectomy and radiation therapy for breast cancer in 2009.  EXAM: DIGITAL DIAGNOSTIC  BILATERAL MAMMOGRAM WITH CAD  ULTRASOUND RIGHT BREAST  COMPARISON:  Previous examinations.  ACR Breast Density Category c: The breast tissue is heterogeneously dense, which may obscure small masses.  FINDINGS: Post lumpectomy changes are again demonstrated on the left with progressive calcification associated with fat necrosis.  On the right, there has been a diffuse increase in density throughout the central portion of the breast, centered in the upper-outer quadrant, measuring approximately 6 cm in maximum diameter. There are associated interval irregular, linear, branching calcifications within this area  and extending more posteriorly. There is also a separate area of similar appearing calcifications in the posterior aspect of the breast, slightly medially and superiorly. There is also interval diffuse skin thickening in the anterior, medial and inferior portions of the right breast. There are also interval enlarged right axillary lymph nodes.  Mammographic images were processed with CAD.  On physical exam, the patient has an approximately 5 cm rounded, firm, fixed, palpable mass centered in the upper-outer quadrant of the right breast. There is mild skin redness in that area. There is also a faintly palpable right axillary lymph node.  Ultrasound is performed, showing a large, irregular, hypoechoic mass in the 11 o'clock of the right breast, 1 cm from the nipple, measuring approximately 4.1 x 3.3 x 2.8 cm in maximum dimensions. There is associated dense posterior acoustical shadowing.  More laterally in the right breast, in the 9 o'clock position, 6 cm from the nipple, there is an approximately 4.4 x 3.5 x 1.7 cm irregular hypoechoic area with milder posterior acoustical shadowing.  In the right axilla, multiple abnormal appearing right axillary lymph nodes are demonstrated with areas of marked focal cortical thickening measuring up to 9 mm in thickness.  IMPRESSION: 1. Interval changes of extensive, multifocal and multicentric malignancy involving a large portion of the right breast, centered in the  upper-outer quadrant. 2. Interval metastatic right axillary adenopathy. 3. Interval right breast skin thickening compatible with lymphatic obstruction and possible dermal extension of tumor.  RECOMMENDATION: Ultrasound-guided core needle biopsies of the masses seen in the 11 o'clock and 9 o'clock positions of the right breast ultrasound and ultrasound-guided core needle biopsy of the largest abnormal appearing right axillary lymph node. These have been scheduled for 9 a.m. on 12/26/2013.  I have discussed the findings  and recommendations with the patient. Results were also provided in writing at the conclusion of the visit. If applicable, a reminder letter will be sent to the patient regarding the next appointment.  BI-RADS CATEGORY  5: Highly suggestive of malignancy.   Electronically Signed   By: Enrique Sack M.D.   On: 12/13/2013 14:20   Mm Digital Diagnostic Unilat R  12/21/2013   CLINICAL DATA:  Status post right breast ultrasound-guided core needle biopsies.  EXAM: DIAGNOSTIC RIGHT MAMMOGRAM POST ULTRASOUND BIOPSY  COMPARISON:  Previous exams  FINDINGS: Mammographic images were obtained following ultrasound guided biopsy of a 4.1 cm mass in the 11 o'clock position of the right breast and a 4.4 cm irregular hypoechoic area in the 9 o'clock position of the right breast. These demonstrate a ribbon shaped biopsy marker at the inferior aspect of the irregular microcalcifications seen within an area of increased density in the upper outer right breast at recent mammography, corresponding to the biopsied 4.1 cm mass in the 11 o'clock position of the breast. A wing shaped clip is located 2 cm more laterally in the breast, corresponding to the biopsied 4.4 cm irregular hypoechoic area in the 9 o'clock position of the breast. Diffuse breast skin thickening and edema is again demonstrated.  IMPRESSION: Appropriate deployment of ribbon shaped and wing shaped biopsy marker clips, as described above.  Final Assessment: Post Procedure Mammograms for Marker Placement   Electronically Signed   By: Enrique Sack M.D.   On: 12/21/2013 15:44   US Breast Ltd Uni Right Inc Axilla  12/13/2013   CLINICAL DATA:  Status post left lumpectomy and radiation therapy for breast cancer in 2009.  EXAM: DIGITAL DIAGNOSTIC  BILATERAL MAMMOGRAM WITH CAD  ULTRASOUND RIGHT BREAST  COMPARISON:  Previous examinations.  ACR Breast Density Category c: The breast tissue is heterogeneously dense, which may obscure small masses.  FINDINGS: Post lumpectomy changes  are again demonstrated on the left with progressive calcification associated with fat necrosis.  On the right, there has been a diffuse increase in density throughout the central portion of the breast, centered in the upper-outer quadrant, measuring approximately 6 cm in maximum diameter. There are associated interval irregular, linear, branching calcifications within this area and extending more posteriorly. There is also a separate area of similar appearing calcifications in the posterior aspect of the breast, slightly medially and superiorly. There is also interval diffuse skin thickening in the anterior, medial and inferior portions of the right breast. There are also interval enlarged right axillary lymph nodes.  Mammographic images were processed with CAD.  On physical exam, the patient has an approximately 5 cm rounded, firm, fixed, palpable mass centered in the upper-outer quadrant of the right breast. There is mild skin redness in that area. There is also a faintly palpable right axillary lymph node.  Ultrasound is performed, showing a large, irregular, hypoechoic mass in the 11 o'clock of the right breast, 1 cm from the nipple, measuring approximately 4.1 x 3.3 x 2.8 cm in maximum dimensions. There is associated dense posterior  acoustical shadowing.  More laterally in the right breast, in the 9 o'clock position, 6 cm from the nipple, there is an approximately 4.4 x 3.5 x 1.7 cm irregular hypoechoic area with milder posterior acoustical shadowing.  In the right axilla, multiple abnormal appearing right axillary lymph nodes are demonstrated with areas of marked focal cortical thickening measuring up to 9 mm in thickness.  IMPRESSION: 1. Interval changes of extensive, multifocal and multicentric malignancy involving a large portion of the right breast, centered in the upper-outer quadrant. 2. Interval metastatic right axillary adenopathy. 3. Interval right breast skin thickening compatible with lymphatic  obstruction and possible dermal extension of tumor.  RECOMMENDATION: Ultrasound-guided core needle biopsies of the masses seen in the 11 o'clock and 9 o'clock positions of the right breast ultrasound and ultrasound-guided core needle biopsy of the largest abnormal appearing right axillary lymph node. These have been scheduled for 9 a.m. on 12/26/2013.  I have discussed the findings and recommendations with the patient. Results were also provided in writing at the conclusion of the visit. If applicable, a reminder letter will be sent to the patient regarding the next appointment.  BI-RADS CATEGORY  5: Highly suggestive of malignancy.   Electronically Signed   By: Enrique Sack M.D.   On: 12/13/2013 14:20   [place] Rt Breast Bx W Loc Dev 1st Lesion Img Bx Spec US Guide  12/22/2013   ADDENDUM REPORT: 12/22/2013 12:04  ADDENDUM: Pathology results: Right breast at 11:00 - grade 3 invasive mammary carcinoma and mammary carcinoma in situ, ductal favored. Right breast at 9:00 - grade 3 invasive mammary carcinoma, ductal favored. Right axillary lymph node - metastatic carcinoma. These results were found to be concordant by Dr. Andres Shad. Pathology was discussed with the patient and her daughter, [name], by telephone. The patient reported doing well after the biopsy. Post biopsy instructions were reviewed and their questions were answered. The patient has an appointment at the [facility] on December 28, 2013. She is scheduled for a bilateral breast MRI on December 26, 2013. The patient is encouraged to come to [facility] for educational materials. My number is provided to the patient and her daughter for future questions and concerns.  Pathology results reported by Susa Raring RN, BSN on December 22, 2013.   Electronically Signed   By: Enrique Sack M.D.   On: 12/22/2013 12:04   12/22/2013   CLINICAL DATA:  4.1 cm 11 o'clock right breast mass with imaging features highly  suspicious for malignancy.  EXAM: ULTRASOUND GUIDED RIGHT BREAST CORE NEEDLE BIOPSY  COMPARISON:  Previous exams.  FINDINGS: I met with the patient and we discussed the procedure of ultrasound-guided biopsy, including benefits and alternatives. We discussed the high likelihood of a successful procedure. We discussed the risks of the procedure, including infection, bleeding, tissue injury, clip migration, and inadequate sampling. Informed written consent was given. The usual time-out protocol was performed immediately prior to the procedure.  Using sterile technique and 2% Lidocaine as local anesthetic, under direct ultrasound visualization, a 12 gauge spring-loaded device was used to perform biopsy of the recently demonstrated 4.1 cm mass in the 11 o'clock position of the right breast using an inferior approach. At the conclusion of the procedure a ribbon shaped tissue marker clip was deployed into the biopsy cavity. Follow up 2 view mammogram was performed and dictated separately.  IMPRESSION: Ultrasound guided biopsy of a 4.1 cm 11 o'clock right breast mass. No apparent  complications.  Electronically Signed: By: Enrique Sack M.D. 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 Right breast at 9:00 - grade 3 invasive mammary carcinoma, ductal favored. Right axillary lymph node - metastatic carcinoma. These results were found to be concordant by Dr. Andres Shad. Pathology was discussed with the patient and her daughter, [name], by telephone. The patient reported doing well after the biopsy. Post biopsy instructions were reviewed and their questions were answered. The patient has an appointment at the [facility] on December 28, 2013. She is scheduled for a bilateral  breast MRI on December 26, 2013. The patient is encouraged to come to [facility] for educational materials. My number is provided to the patient and her daughter for future questions and concerns.  Pathology results reported by Susa Raring RN, BSN on December 22, 2013.   Electronically Signed   By: Enrique Sack M.D.   On: 12/22/2013 12:05   12/22/2013   CLINICAL DATA:  Abnormal appearing right axillary lymph nodes at recent ultrasound with imaging features highly suspicious for metastatic lymph nodes.  EXAM: ULTRASOUND GUIDED RIGHT AXILLARY CORE NEEDLE BIOPSY  COMPARISON:  Previous exams.  FINDINGS: I met with the patient and we discussed the procedure of ultrasound-guided biopsy, including benefits and alternatives. We discussed the high likelihood of a successful procedure. We discussed the risks of the procedure, including infection, bleeding, tissue injury, clip migration, and inadequate sampling. Informed written consent was given. The usual time-out protocol was performed immediately prior to the procedure.  Using sterile technique and 2% Lidocaine as local anesthetic, under direct ultrasound visualization, a 12 gauge spring-loaded device was used to perform biopsy of one of the recently demonstrated abnormal appearing right axillary lymph nodes using an inferior approach.  IMPRESSION: Ultrasound guided biopsy of an abnormal right axillary lymph node. No apparent complications.  Electronically Signed: By: Enrique Sack M.D. 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 Right breast at 9:00 - grade 3 invasive mammary carcinoma, ductal favored. Right axillary lymph node - metastatic carcinoma. These results were found to be concordant by Dr. Andres Shad. Pathology was  discussed with the patient and her daughter, [name], by telephone. The patient reported doing well after the biopsy. Post biopsy instructions were reviewed and their questions were answered. The patient has an appointment at the [facility] on December 28, 2013. She is scheduled for a bilateral breast MRI on December 26, 2013. The patient is encouraged to come to [facility] for educational materials. My number is provided to the patient and her daughter for future questions and concerns.  Pathology results reported by Susa Raring RN, BSN on December 22, 2013.   Electronically Signed   By: Enrique Sack M.D.   On: 12/22/2013 12:05   12/22/2013   CLINICAL DATA:  4.4 cm irregular hypoechoic area in the 9 o'clock position of the right breast at recent ultrasound, suspicious for malignancy.  EXAM: ULTRASOUND  GUIDED RIGHT BREAST CORE NEEDLE BIOPSY  COMPARISON:  Previous exams.  PROCEDURE: I met with the patient and we discussed the procedure of ultrasound-guided biopsy, including benefits and alternatives. We discussed the high likelihood of a successful procedure. We discussed the risks of the procedure including infection, bleeding, tissue injury, clip migration, and inadequate sampling. Informed written consent was given. The usual time-out protocol was performed immediately prior to the procedure.  Using sterile technique and 2% Lidocaine as local anesthetic, under direct ultrasound visualization, a 12 gauge vacuum-assisteddevice was used to perform biopsy of the recently demonstrated 4.4 cm irregular hypoechoic area in the 9 o'clock position of the right breast using a inferomedial approach. At the conclusion of the procedure, a coil shaped tissue marker clip was deployed into the biopsy cavity. Follow-up 2-view mammogram was performed and dictated separately.  IMPRESSION: Ultrasound-guided biopsy of a 4.4 cm irregular hypoechoic area in the 9 o'clock position of the  right breast. No apparent complications.  Electronically Signed: By: Enrique Sack M.D. On: 12/21/2013 14:37    ASSESSMENT: 58 y.o. [place] woman status post right breast biopsy of 2 separate masses in the right axillary lymph node 12/21/2013 showing a clinical T3 N3, stage IIIC invasive ductal carcinoma, triple negative, with an MIB-1 of 85%  (1) to start neoadjuvant chemotherapy 01/16/2014, consisting of doxorubicin and cyclophosphamide in dose dense fashion x4 with Neulasta support, to be followed by weekly paclitaxel and carboplatin x12  (2) surgery, most likely a modified radical mastectomy, to follow chemotherapy  (3) radiation to follow surgery  (4) also status post left lumpectomy and sentinel lymph node sampling 11/23/2007 for a pTis pN0, stage 0 ductal carcinoma in situ, high-grade, estrogen and progesterone receptor negative, status post adjuvant radiation  PLAN: We spent the better part of today's hour-long appointment discussing the biology of breast cancer in general, and the specifics of the patient's tumor in particular. Jalacia understands the difference between local and systemic treatment for breast cancer. For local treatment of course she will have surgery and radiation. For systemic treatment unfortunately, since her tumor is triple negative, the only choice is chemotherapy.  In your case we are going to start with chemotherapy, because it will make the surgery easier, although the expectation that it might allow her to keep the breast is low. She understands there is no difference in survival between the sequence of chemotherapy then surgery versus surgery then chemotherapy.  We then discussed the specifics of chemotherapy, which will consist of doxorubicin and cyclophosphamide in dose dense fashion x4 followed by carboplatin and paclitaxel weekly x12. We discussed the possible toxicities, side effects and complications and this will be reinforced that "chemotherapy school"  which she will visit next week.  Of course she will need an echocardiogram and a port. We are also going to obtain staging studies as per NCCN guidelines given that her tumor is stage III. Hopefully all that will be in place by the time she returns to see me July 31, at which point we will discuss the results of those studies, and set her up with the antinausea and other supportive medications. The target start date for chemotherapy is August 3.  The patient has a good understanding of the overall plan. She agrees with it. She knows the goal of treatment in her case is cure. She will call with any problems that may develop before her next visit here.  Diana Cruel, MD   12/28/2013 3:33 PM

## 2013-12-28 NOTE — Progress Notes (Signed)
Radiation Oncology         [phone]) [phone] ________________________________  Initial outpatient Consultation  Name: [name] MRN: [id number]  Date: 12/28/2013  DOB: Sep 21, 1955  UL:AGTXMI, Vernon Prey, MD  Magrinat, Virgie Dad, MD   REFERRING PHYSICIAN: Magrinat, Virgie Dad, MD  DIAGNOSIS: Clinical T3, N3, stage IIIC invasive ductal carcinoma, triple negative  HISTORY OF PRESENT ILLNESS::[name] is a 58 y.o. female who is seen out of the courtesy of Dr. Marlou Starks as part of the multidisciplinary breast clinic for consultation as it relates to the patient's recent diagnosis of right breast cancer. Patient has a prior history of left breast cancer undergoing lumpectomy and radiation therapy in 2009.  Patient continue with yearly mammography in total 2011. Last year the patient began noticing some swelling in the right breast.  The patient presented to her primary care physician earlier this year and the right breast was noted to be larger than the left. A workup was initiated., Mammography the patient was noted to have a palpable approximately 5 cm mass in the upper outer quadrant of the right breast with some erythema noted. There is also a palpable right axillary lymph nodes. Ultrasound confirmed a large irregular hypoechoic mass measuring approximately 4.1 cm. The patient was also noted to have a separate mass in the 9:00 position measuring approximately 4.4 cm. Within the axillary area the patient was noted to have multiple abnormal appearing lymph nodes. Biopsy was performed of both breast masses as well as nodes. Patient was found to have a grade 3 invasive ductal carcinoma, triple negative with a Ki-67 of 85%. MRI was performed which showed the a mass occupying approximately 7.2 cm centrally within the right breast involving all 4 quadrants. AbNormal-appearing level I and level II lymph nodes were also noted. The MRI also showed enlarged right internal mammary lymph node. The left side was  unremarkable. With this information the patient is now seen in consultation.  PREVIOUS RADIATION THERAPY: Yes, breast conservation therapy along the left side in 2009  PAST MEDICAL HISTORY:  has a past medical history of Acid reflux; Asthma; COPD (chronic obstructive pulmonary disease); Cancer; Seasonal allergies; Alcohol abuse; Elevated d-dimer; Allergy; Emphysema of lung; and Hot flashes.    PAST SURGICAL HISTORY: Past Surgical History  Procedure Laterality Date  . Breast lumpectomy    . Abdominal hysterectomy      FAMILY HISTORY: family history includes Hypertension in her other.  SOCIAL HISTORY:  reports that she has been smoking Cigarettes.  She has a 30 pack-year smoking history. She does not have any smokeless tobacco history on file. She reports that she drinks alcohol. She reports that she does not use illicit drugs.  ALLERGIES: Penicillins  MEDICATIONS:  Current Outpatient Prescriptions  Medication Sig Dispense Refill  . albuterol (PROVENTIL HFA;VENTOLIN HFA) 108 (90 BASE) MCG/ACT inhaler Inhale 2 puffs into the lungs every 6 (six) hours as needed for wheezing or shortness of breath. For shortness of breath  3 Inhaler  3  . Bacitracin-Pramoxine HCl 500-10 UNIT-MG/GM OINT Apply 1 application topically 2 (two) times daily.  28 g  0  . carbamide peroxide (DEBROX) 6.5 % otic solution Place 5 drops into both ears 2 (two) times daily.  15 mL  1  . Clobetasol Prop Emollient Base 0.05 % emollient cream Apply 1 application topically 2 (two) times daily. until symptoms resolve. Use scalp applicator  30 g  5  . fluticasone (FLOVENT HFA) 220 MCG/ACT inhaler Inhale 1 puff into the lungs 2 (two)  times daily.  3 Inhaler  3  . hydrOXYzine (ATARAX/VISTARIL) 25 MG tablet Take 1 tablet (25 mg total) by mouth every 8 (eight) hours as needed for itching.  60 tablet  2  . loratadine (CLARITIN) 10 MG tablet Take 1 tablet (10 mg total) by mouth daily.  30 tablet  10  . methylPREDNISolone (MEDROL  DOSEPAK) 4 MG tablet As directed  21 tablet  0  . nicotine (NICODERM CQ) 21 mg/24hr patch Place 1 patch (21 mg total) onto the skin daily.  28 patch  0  . pantoprazole (PROTONIX) 40 MG tablet Take 1 tablet (40 mg total) by mouth daily.  30 tablet  2  . predniSONE (DELTASONE) 10 MG tablet Take 4 tabs daily for 4 days, 3 tabs daily for 4 days, 2 tabs daily for 4 days, then 1 tab daily for 4 days.  40 tablet  0  . predniSONE (DELTASONE) 10 MG tablet 4 tabs daily for 5 days, 3 tabs daily for 4 days, 2 tabs daily for 3 days, 1 tab daily for 2 days  40 tablet  0  . silver sulfADIAZINE (SILVADENE) 1 % cream Apply topically 2 (two) times daily.  50 g  1  . sulfamethoxazole-trimethoprim (BACTRIM DS) 800-160 MG per tablet Take 1 tablet by mouth 2 (two) times daily.  20 tablet  0  . triamcinolone cream (KENALOG) 0.1 % Apply topically 2 (two) times daily.  80 g  5  . Vitamin D, Ergocalciferol, (DRISDOL) 50000 UNITS CAPS capsule Take 1 capsule (50,000 Units total) by mouth every 7 (seven) days.  12 capsule  0   No current facility-administered medications for this encounter.    REVIEW OF SYSTEMS:  A 15 point review of systems is documented in the electronic medical record. This was obtained by the nursing staff. However, I reviewed this with the patient to discuss relevant findings and make appropriate changes. Patient has discomfort and swelling in the right breast as well as axillary area areas.  she has some limitation of abduction of her right arm. Patient also complains of some discomfort/pain in the right scapular region. She denies any headaches or visual problems. She denies any cough or breathing problems.   PHYSICAL EXAM:  Vitals - 1 value per visit 10/10/621  SYSTOLIC 762  DIASTOLIC 63  Pulse 62  Temperature 98.4  Respirations 18  Weight (lb) 104.6  Height 4' 11"   BMI 21.12  VISIT REPORT    This is a pleasant 58 year old female in no acute distress. She is accompanied by her daughter  evaluation. Summation of the pupils reveals them to be equal round and reactive to light. The extraocular eye movements are intact.. . Examination of the neck and supraclavicular region reveals no evidence of adenopathy. The left axilla is free of adenopathy. The right axilla has 1-2 palpable lymph nodes estimated to be approximately 1-1/2 cm in size. Examination of the left breast reveals lumpectomy scar in the upper aspect with hyperpigmentation changes noted consistent with prior radiation treatment.  mild induration at the lumpectomy scar but no dominant masses appreciated in the breast. Examination right breast reveals to be larger than the left. There is some erythema noted along the breast. Patient is quite tender with palpation. A large mass is noted occupying the upper outer quadrant extending into the lower outer quadrant estimated to be approximately 7 cm. There is no nipple discharge or bleeding noted.  On neurological exam the patient has some limitation of abduction of her  right arm presumably from pain.    ECOG = 1  0 - Asymptomatic (Fully active, able to carry on all predisease activities without restriction)  1 - Symptomatic but completely ambulatory (Restricted in physically strenuous activity but ambulatory and able to carry out work of a light or sedentary nature. For example, light housework, office work)  2 - Symptomatic, <50% in bed during the day (Ambulatory and capable of all self care but unable to carry out any work activities. Up and about more than 50% of waking hours)  3 - Symptomatic, >50% in bed, but not bedbound (Capable of only limited self-care, confined to bed or chair 50% or more of waking hours)  4 - Bedbound (Completely disabled. Cannot carry on any self-care. Totally confined to bed or chair)  5 - Death   Eustace Pen MM, Creech RH, Tormey DC, et al. [phone]). "Toxicity and response criteria of the [facility]". [place] Oncol. 5 (6):  649-55  LABORATORY DATA:  Lab Results  Component Value Date   WBC 8.0 12/28/2013   HGB 14.0 12/28/2013   HCT 43.1 12/28/2013   MCV 98.1 12/28/2013   PLT 224 12/28/2013   NEUTROABS 4.4 12/28/2013   Lab Results  Component Value Date   NA 137 12/28/2013   K 5.2* 12/28/2013   CL 104 10/18/2013   CO2 20* 12/28/2013   GLUCOSE 101 12/28/2013   CREATININE 0.8 12/28/2013   CALCIUM 9.2 12/28/2013      RADIOGRAPHY: Mr Breast Bilateral W Wo Contrast  12/26/2013   CLINICAL DATA:  Recently diagnosed right breast invasive ductal carcinoma at two locations and metastatic right axillary lymph node. Status post left lumpectomy and radiation therapy for breast cancer in 2009.  LABS:  None obtained today.  EXAM: BILATERAL BREAST MRI WITH AND WITHOUT CONTRAST  TECHNIQUE: Multiplanar, multisequence MR images of both breasts were obtained prior to and following the intravenous administration of 18m of MultiHance.  THREE-DIMENSIONAL MR IMAGE RENDERING ON INDEPENDENT WORKSTATION:  Three-dimensional MR images were rendered by post-processing of the original MR data on an independent workstation. The three-dimensional MR images were interpreted, and findings are reported in the following complete MRI report for this study. Three dimensional images were evaluated at the independent DynaCad workstation  COMPARISON:  Recent mammogram, ultrasound and biopsy examinations.  FINDINGS: Breast composition: c:  Heterogeneous fibroglandular tissue  Background parenchymal enhancement: Minimal  Right breast: Large, irregular enhancing mass in the central right breast, involving all four quadrants, centered superiorly. This extends to the posterior aspect of the nipple without evidence of extension into the nipple. This is not involving the pectoralis muscle. This measures 7.2 x 4.5 x 3.7 cm in maximum dimensions. This contains biopsy marker clip artifacts posteriorly and laterally. There is also diffuse right breast skin thickening throughout  the upper half of the breast with associated abnormal skin enhancement.  Left breast: Post lumpectomy changes with no mass or enhancement suspicious for malignancy.  Lymph nodes: Multiple abnormal appearing right axillary lymph nodes with loss of the normal fatty hila. These include level 1 nodes and a 10 mm short axis node extending posterior to the lateral aspect of the pectoralis minor muscle on inversion recovery image number 3. There is also an abnormal appearing right internal mammary lymph node with a short axis diameter of 5 mm on image number 59 of series 13.  Ancillary findings:  None.  IMPRESSION: 1. 7.2 x 4.5 x 3.7 cm biopsy-proven invasive ductal carcinoma in in  the central right breast, involving all 4 quadrants, centered superiorly. 2. Diffuse skin thickening and enhancement in the superior half of the right breast, suspicious for dermal invasion of malignancy. 3. Level 1 and level 2 metastatic right axillary adenopathy. 4. Metastatic right internal mammary adenopathy. 5. Post lumpectomy changes on the left without evidence of recurrent malignancy in the left breast.  RECOMMENDATION: Treatment plan.  BI-RADS CATEGORY  6: Known biopsy-proven malignancy.   Electronically Signed   By: Enrique Sack M.D.   On: 12/26/2013 15:33   Mm Digital Diagnostic Bilat  12/13/2013   CLINICAL DATA:  Status post left lumpectomy and radiation therapy for breast cancer in 2009.  EXAM: DIGITAL DIAGNOSTIC  BILATERAL MAMMOGRAM WITH CAD  ULTRASOUND RIGHT BREAST  COMPARISON:  Previous examinations.  ACR Breast Density Category c: The breast tissue is heterogeneously dense, which may obscure small masses.  FINDINGS: Post lumpectomy changes are again demonstrated on the left with progressive calcification associated with fat necrosis.  On the right, there has been a diffuse increase in density throughout the central portion of the breast, centered in the upper-outer quadrant, measuring approximately 6 cm in maximum diameter.  There are associated interval irregular, linear, branching calcifications within this area and extending more posteriorly. There is also a separate area of similar appearing calcifications in the posterior aspect of the breast, slightly medially and superiorly. There is also interval diffuse skin thickening in the anterior, medial and inferior portions of the right breast. There are also interval enlarged right axillary lymph nodes.  Mammographic images were processed with CAD.  On physical exam, the patient has an approximately 5 cm rounded, firm, fixed, palpable mass centered in the upper-outer quadrant of the right breast. There is mild skin redness in that area. There is also a faintly palpable right axillary lymph node.  Ultrasound is performed, showing a large, irregular, hypoechoic mass in the 11 o'clock of the right breast, 1 cm from the nipple, measuring approximately 4.1 x 3.3 x 2.8 cm in maximum dimensions. There is associated dense posterior acoustical shadowing.  More laterally in the right breast, in the 9 o'clock position, 6 cm from the nipple, there is an approximately 4.4 x 3.5 x 1.7 cm irregular hypoechoic area with milder posterior acoustical shadowing.  In the right axilla, multiple abnormal appearing right axillary lymph nodes are demonstrated with areas of marked focal cortical thickening measuring up to 9 mm in thickness.  IMPRESSION: 1. Interval changes of extensive, multifocal and multicentric malignancy involving a large portion of the right breast, centered in the upper-outer quadrant. 2. Interval metastatic right axillary adenopathy. 3. Interval right breast skin thickening compatible with lymphatic obstruction and possible dermal extension of tumor.  RECOMMENDATION: Ultrasound-guided core needle biopsies of the masses seen in the 11 o'clock and 9 o'clock positions of the right breast ultrasound and ultrasound-guided core needle biopsy of the largest abnormal appearing right axillary  lymph node. These have been scheduled for 9 a.m. on 12/26/2013.  I have discussed the findings and recommendations with the patient. Results were also provided in writing at the conclusion of the visit. If applicable, a reminder letter will be sent to the patient regarding the next appointment.  BI-RADS CATEGORY  5: Highly suggestive of malignancy.   Electronically Signed   By: Enrique Sack M.D.   On: 12/13/2013 14:20   Mm Digital Diagnostic Unilat R  12/21/2013   CLINICAL DATA:  Status post right breast ultrasound-guided core needle biopsies.  EXAM: DIAGNOSTIC RIGHT MAMMOGRAM  POST ULTRASOUND BIOPSY  COMPARISON:  Previous exams  FINDINGS: Mammographic images were obtained following ultrasound guided biopsy of a 4.1 cm mass in the 11 o'clock position of the right breast and a 4.4 cm irregular hypoechoic area in the 9 o'clock position of the right breast. These demonstrate a ribbon shaped biopsy marker at the inferior aspect of the irregular microcalcifications seen within an area of increased density in the upper outer right breast at recent mammography, corresponding to the biopsied 4.1 cm mass in the 11 o'clock position of the breast. A wing shaped clip is located 2 cm more laterally in the breast, corresponding to the biopsied 4.4 cm irregular hypoechoic area in the 9 o'clock position of the breast. Diffuse breast skin thickening and edema is again demonstrated.  IMPRESSION: Appropriate deployment of ribbon shaped and wing shaped biopsy marker clips, as described above.  Final Assessment: Post Procedure Mammograms for Marker Placement   Electronically Signed   By: Enrique Sack M.D.   On: 12/21/2013 15:44   US Breast Ltd Uni Right Inc Axilla  12/13/2013   CLINICAL DATA:  Status post left lumpectomy and radiation therapy for breast cancer in 2009.  EXAM: DIGITAL DIAGNOSTIC  BILATERAL MAMMOGRAM WITH CAD  ULTRASOUND RIGHT BREAST  COMPARISON:  Previous examinations.  ACR Breast Density Category c: The breast tissue  is heterogeneously dense, which may obscure small masses.  FINDINGS: Post lumpectomy changes are again demonstrated on the left with progressive calcification associated with fat necrosis.  On the right, there has been a diffuse increase in density throughout the central portion of the breast, centered in the upper-outer quadrant, measuring approximately 6 cm in maximum diameter. There are associated interval irregular, linear, branching calcifications within this area and extending more posteriorly. There is also a separate area of similar appearing calcifications in the posterior aspect of the breast, slightly medially and superiorly. There is also interval diffuse skin thickening in the anterior, medial and inferior portions of the right breast. There are also interval enlarged right axillary lymph nodes.  Mammographic images were processed with CAD.  On physical exam, the patient has an approximately 5 cm rounded, firm, fixed, palpable mass centered in the upper-outer quadrant of the right breast. There is mild skin redness in that area. There is also a faintly palpable right axillary lymph node.  Ultrasound is performed, showing a large, irregular, hypoechoic mass in the 11 o'clock of the right breast, 1 cm from the nipple, measuring approximately 4.1 x 3.3 x 2.8 cm in maximum dimensions. There is associated dense posterior acoustical shadowing.  More laterally in the right breast, in the 9 o'clock position, 6 cm from the nipple, there is an approximately 4.4 x 3.5 x 1.7 cm irregular hypoechoic area with milder posterior acoustical shadowing.  In the right axilla, multiple abnormal appearing right axillary lymph nodes are demonstrated with areas of marked focal cortical thickening measuring up to 9 mm in thickness.  IMPRESSION: 1. Interval changes of extensive, multifocal and multicentric malignancy involving a large portion of the right breast, centered in the upper-outer quadrant. 2. Interval metastatic right  axillary adenopathy. 3. Interval right breast skin thickening compatible with lymphatic obstruction and possible dermal extension of tumor.  RECOMMENDATION: Ultrasound-guided core needle biopsies of the masses seen in the 11 o'clock and 9 o'clock positions of the right breast ultrasound and ultrasound-guided core needle biopsy of the largest abnormal appearing right axillary lymph node. These have been scheduled for 9 a.m. on 12/26/2013.  I have  discussed the findings and recommendations with the patient. Results were also provided in writing at the conclusion of the visit. If applicable, a reminder letter will be sent to the patient regarding the next appointment.  BI-RADS CATEGORY  5: Highly suggestive of malignancy.   Electronically Signed   By: Enrique Sack M.D.   On: 12/13/2013 14:20   [place] Rt Breast Bx W Loc Dev 1st Lesion Img Bx Spec US Guide  12/22/2013   ADDENDUM REPORT: 12/22/2013 12:04  ADDENDUM: Pathology results: Right breast at 11:00 - grade 3 invasive mammary carcinoma and mammary carcinoma in situ, ductal favored. Right breast at 9:00 - grade 3 invasive mammary carcinoma, ductal favored. Right axillary lymph node - metastatic carcinoma. These results were found to be concordant by Dr. Andres Shad. Pathology was discussed with the patient and her daughter, [name], by telephone. The patient reported doing well after the biopsy. Post biopsy instructions were reviewed and their questions were answered. The patient has an appointment at the [facility] on December 28, 2013. She is scheduled for a bilateral breast MRI on December 26, 2013. The patient is encouraged to come to [facility] for educational materials. My number is provided to the patient and her daughter for future questions and concerns.  Pathology results reported by Susa Raring RN, BSN on December 22, 2013.   Electronically Signed   By: Enrique Sack M.D.   On: 12/22/2013 12:04   12/22/2013    CLINICAL DATA:  4.1 cm 11 o'clock right breast mass with imaging features highly suspicious for malignancy.  EXAM: ULTRASOUND GUIDED RIGHT BREAST CORE NEEDLE BIOPSY  COMPARISON:  Previous exams.  FINDINGS: I met with the patient and we discussed the procedure of ultrasound-guided biopsy, including benefits and alternatives. We discussed the high likelihood of a successful procedure. We discussed the risks of the procedure, including infection, bleeding, tissue injury, clip migration, and inadequate sampling. Informed written consent was given. The usual time-out protocol was performed immediately prior to the procedure.  Using sterile technique and 2% Lidocaine as local anesthetic, under direct ultrasound visualization, a 12 gauge spring-loaded device was used to perform biopsy of the recently demonstrated 4.1 cm mass in the 11 o'clock position of the right breast using an inferior approach. At the conclusion of the procedure a ribbon shaped tissue marker clip was deployed into the biopsy cavity. Follow up 2 view mammogram was performed and dictated separately.  IMPRESSION: Ultrasound guided biopsy of a 4.1 cm 11 o'clock right breast mass. No apparent complications.  Electronically Signed: By: Enrique Sack M.D. 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 Right breast at 9:00 - grade 3 invasive mammary carcinoma, ductal favored. Right axillary lymph node - metastatic carcinoma. These results were found to be concordant by Dr. Andres Shad. Pathology was discussed with the patient and her daughter, [name], by telephone. The patient reported doing well after the biopsy. Post biopsy instructions were reviewed and their questions were answered. The patient has an appointment at the [facility] on December 28, 2013. She is scheduled for a bilateral breast MRI on December 26, 2013. The patient is encouraged to come to [facility] for  Scientist, clinical (histocompatibility and immunogenetics). My number is provided to the patient and her daughter for future questions and concerns.  Pathology results reported by Susa Raring RN, BSN on December 22, 2013.   Electronically Signed   By: Enrique Sack M.D.   On: 12/22/2013 12:05   12/22/2013   CLINICAL DATA:  Abnormal appearing right axillary lymph nodes at recent ultrasound with imaging features highly suspicious for metastatic lymph nodes.  EXAM: ULTRASOUND GUIDED RIGHT AXILLARY CORE NEEDLE BIOPSY  COMPARISON:  Previous exams.  FINDINGS: I met with the patient and we discussed the procedure of ultrasound-guided biopsy, including benefits and alternatives. We discussed the high likelihood of a successful procedure. We discussed the risks of the procedure, including infection, bleeding, tissue injury, clip migration, and inadequate sampling. Informed written consent was given. The usual time-out protocol was performed immediately prior to the procedure.  Using sterile technique and 2% Lidocaine as local anesthetic, under direct ultrasound visualization, a 12 gauge spring-loaded device was used to perform biopsy of one of the recently demonstrated abnormal appearing right axillary lymph nodes using an inferior approach.  IMPRESSION: Ultrasound guided biopsy of an abnormal right axillary lymph node. No apparent complications.  Electronically Signed: By: Enrique Sack M.D. 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 Right breast at 9:00 - grade 3 invasive mammary carcinoma, ductal favored. Right axillary lymph node - metastatic  carcinoma. These results were found to be concordant by Dr. Andres Shad. Pathology was discussed with the patient and her daughter, [name], by telephone. The patient reported doing well after the biopsy. Post biopsy instructions were reviewed and their questions were answered. The patient has an appointment at the [facility] on December 28, 2013. She is scheduled for a bilateral breast MRI on December 26, 2013. The patient is encouraged to come to [facility] for educational materials. My number is provided to the patient and her daughter for future questions and concerns.  Pathology results reported by Susa Raring RN, BSN on December 22, 2013.   Electronically Signed   By: Enrique Sack M.D.   On: 12/22/2013 12:05   12/22/2013   CLINICAL DATA:  4.4 cm irregular hypoechoic area in the 9 o'clock position of the right breast at recent ultrasound, suspicious for malignancy.  EXAM: ULTRASOUND GUIDED RIGHT BREAST CORE NEEDLE BIOPSY  COMPARISON:  Previous exams.  PROCEDURE: I met with the patient and we discussed the procedure of ultrasound-guided biopsy, including benefits and alternatives. We discussed the high likelihood of a successful procedure. We discussed the risks of the procedure including infection, bleeding, tissue injury, clip migration, and inadequate sampling. Informed written consent was given. The usual time-out protocol was performed immediately prior to the procedure.  Using sterile technique and 2% Lidocaine as local anesthetic, under direct ultrasound visualization, a 12 gauge vacuum-assisteddevice was used to perform biopsy of the recently demonstrated 4.4 cm irregular hypoechoic area in the 9 o'clock position of the right breast using a inferomedial approach. At the conclusion of the procedure, a coil shaped tissue marker clip was deployed into the biopsy cavity. Follow-up 2-view mammogram was performed and dictated separately.  IMPRESSION:  Ultrasound-guided biopsy of a 4.4 cm irregular hypoechoic area in the 9 o'clock position of  the right breast. No apparent complications.  Electronically Signed: By: Enrique Sack M.D. On: 12/21/2013 14:37      IMPRESSION: Clinical T3, N3, stage IIIC invasive ductal carcinoma, triple negative. The patient will proceed with staging workup and neoadjuvant chemotherapy. Given the extent of involvement within the right breast she would not be a candidate for breast conservation therapy. The patient appears to understand this issue. I would  recommend postmastectomy irradiation therapy as part of her overall management.    PLAN: The patient will proceed with neoadjuvant chemotherapy followed by modified radical mastectomy and radiation therapy at a later date.     ------------------------------------------------  Blair Promise, PhD, MD

## 2013-12-28 NOTE — Progress Notes (Signed)
Patient ID: [name], female   DOB: [birth year], 58 y.o.   MRN: [id number]  No chief complaint on file.   HPI [name] is a 58 y.o. female.  We're asked to see the patient in consultation by Dr. Conchita Paris to evaluate her for a right-sided breast cancer. The patient is a 58 year old black female who actually has a history of a left-sided breast cancer that was removed by me back in 2009. She states that she has felt a mass in her upper right breast for the last 2 years that has been slowly enlarging. She does have some soreness associated with it. She has not had any discharge from her nipple. She underwent ultrasound which estimated the size of 2 adjacent lesions to be 4.1 cm and 4.4 cm. Her MRI showed the extent of disease to measure 7.2 cm. She was a triple negative with her Ki-67 of 85%.  HPI  Past Medical History  Diagnosis Date  . Acid reflux   . Asthma   . COPD (chronic obstructive pulmonary disease)   . Cancer     lumpectomy, radiation 2009  . Seasonal allergies   . Alcohol abuse   . Elevated d-dimer     negative chest CT  . Allergy   . Emphysema of lung   . Hot flashes     Past Surgical History  Procedure Laterality Date  . Breast lumpectomy    . Abdominal hysterectomy      Family History  Problem Relation Age of Onset  . Hypertension Other     Social History History  Substance Use Topics  . Smoking status: Current Every Day Smoker -- 1.00 packs/day for 30 years    Types: Cigarettes  . Smokeless tobacco: Not on file  . Alcohol Use: Yes     Comment: occ beer    Allergies  Allergen Reactions  . Penicillins Other (See Comments)    Reaction a long time ago    Current Outpatient Prescriptions  Medication Sig Dispense Refill  . albuterol (PROVENTIL HFA;VENTOLIN HFA) 108 (90 BASE) MCG/ACT inhaler Inhale 2 puffs into the lungs every 6 (six) hours as needed for wheezing or shortness of breath. For shortness of breath  3 Inhaler  3  .  Bacitracin-Pramoxine HCl 500-10 UNIT-MG/GM OINT Apply 1 application topically 2 (two) times daily.  28 g  0  . carbamide peroxide (DEBROX) 6.5 % otic solution Place 5 drops into both ears 2 (two) times daily.  15 mL  1  . Clobetasol Prop Emollient Base 0.05 % emollient cream Apply 1 application topically 2 (two) times daily. until symptoms resolve. Use scalp applicator  30 g  5  . fluticasone (FLOVENT HFA) 220 MCG/ACT inhaler Inhale 1 puff into the lungs 2 (two) times daily.  3 Inhaler  3  . hydrOXYzine (ATARAX/VISTARIL) 25 MG tablet Take 1 tablet (25 mg total) by mouth every 8 (eight) hours as needed for itching.  60 tablet  2  . loratadine (CLARITIN) 10 MG tablet Take 1 tablet (10 mg total) by mouth daily.  30 tablet  10  . methylPREDNISolone (MEDROL DOSEPAK) 4 MG tablet As directed  21 tablet  0  . nicotine (NICODERM CQ) 21 mg/24hr patch Place 1 patch (21 mg total) onto the skin daily.  28 patch  0  . pantoprazole (PROTONIX) 40 MG tablet Take 1 tablet (40 mg total) by mouth daily.  30 tablet  2  . predniSONE (DELTASONE) 10 MG tablet Take 4 tabs daily  for 4 days, 3 tabs daily for 4 days, 2 tabs daily for 4 days, then 1 tab daily for 4 days.  40 tablet  0  . predniSONE (DELTASONE) 10 MG tablet 4 tabs daily for 5 days, 3 tabs daily for 4 days, 2 tabs daily for 3 days, 1 tab daily for 2 days  40 tablet  0  . silver sulfADIAZINE (SILVADENE) 1 % cream Apply topically 2 (two) times daily.  50 g  1  . sulfamethoxazole-trimethoprim (BACTRIM DS) 800-160 MG per tablet Take 1 tablet by mouth 2 (two) times daily.  20 tablet  0  . triamcinolone cream (KENALOG) 0.1 % Apply topically 2 (two) times daily.  80 g  5  . Vitamin D, Ergocalciferol, (DRISDOL) 50000 UNITS CAPS capsule Take 1 capsule (50,000 Units total) by mouth every 7 (seven) days.  12 capsule  0   No current facility-administered medications for this visit.    Review of Systems Review of Systems  Constitutional: Negative.   HENT: Negative.    Eyes: Negative.   Respiratory: Negative.   Cardiovascular: Negative.   Gastrointestinal: Negative.   Endocrine: Negative.   Genitourinary: Negative.   Musculoskeletal: Negative.   Skin: Negative.   Allergic/Immunologic: Negative.   Neurological: Negative.   Hematological: Negative.   Psychiatric/Behavioral: Negative.     There were no vitals taken for this visit.  Physical Exam Physical Exam  Constitutional: She is oriented to person, place, and time. She appears well-developed and well-nourished.  HENT:  Head: Normocephalic and atraumatic.  Eyes: Conjunctivae and EOM are normal. Pupils are equal, round, and reactive to light.  Neck: Normal range of motion. Neck supple.  Cardiovascular: Normal rate, regular rhythm and normal heart sounds.   Pulmonary/Chest: Effort normal and breath sounds normal.  There is a large palpable mass that is mobile in the upper outer right breast which measures approximate 7-8 cm. There are mobile but palpably enlarged lymph nodes in the right axilla. There is some palpable firm scar in the upper left breast at the site of her previous lumpectomy which is stable. There is no palpable left-sided axillary supraclavicular or cervical lymphadenopathy  Abdominal: Soft. Bowel sounds are normal.  Musculoskeletal: Normal range of motion.  Lymphadenopathy:    She has no cervical adenopathy.  Neurological: She is alert and oriented to person, place, and time.  Skin: Skin is warm and dry.  Psychiatric: She has a normal mood and affect. Her behavior is normal.    Data Reviewed As above  Assessment    The patient has a locally advanced right sided breast cancer with positive lymph nodes. I have talked to her in detail today about the different options for treatment. I think she would be a good candidate for neoadjuvant chemotherapy to try to shrink the tumor. From a surgical standpoint because of the size of the tumor and the positive lymph nodes I think she  would benefit from a modified radical mastectomy. I've discussed with her in detail the risks and benefits of the operation to do this as well as some of the technical aspects and she understands and wishes to proceed. She will also need a Port-A-Cath placed for the chemotherapy which we will do first.     Plan    Plan for placement of a Port-A-Cath for neoadjuvant chemotherapy followed most likely by right modified radical mastectomy        TOTH III,Tykira Wachs S 12/28/2013, 1:47 PM

## 2013-12-29 ENCOUNTER — Encounter: Payer: Self-pay | Admitting: Oncology

## 2013-12-29 NOTE — Progress Notes (Signed)
[name] called and left mess and said the landlord said the patient needed to fill out [id number]. I called her back to advise her to let them know that is incorrect and they must fill out in order for [place] to pay her rent for her. I told [name] if the landlords wants to call me that is fine also.

## 2013-12-30 ENCOUNTER — Ambulatory Visit (HOSPITAL_COMMUNITY): Payer: Medicaid Other

## 2013-12-30 ENCOUNTER — Ambulatory Visit (HOSPITAL_BASED_OUTPATIENT_CLINIC_OR_DEPARTMENT_OTHER): Payer: Medicaid Other | Admitting: Certified Registered"

## 2013-12-30 ENCOUNTER — Ambulatory Visit (HOSPITAL_BASED_OUTPATIENT_CLINIC_OR_DEPARTMENT_OTHER)
Admission: RE | Admit: 2013-12-30 | Discharge: 2013-12-30 | Disposition: A | Discharge status: Home / Self Care | Payer: Medicaid Other | Source: Ambulatory Visit | Attending: General Surgery | Admitting: General Surgery

## 2013-12-30 ENCOUNTER — Encounter (HOSPITAL_BASED_OUTPATIENT_CLINIC_OR_DEPARTMENT_OTHER): Payer: Medicaid Other | Admitting: Certified Registered"

## 2013-12-30 ENCOUNTER — Encounter (HOSPITAL_BASED_OUTPATIENT_CLINIC_OR_DEPARTMENT_OTHER)
Admission: RE | Disposition: A | Discharge status: Home / Self Care | Payer: Self-pay | Source: Ambulatory Visit | Attending: General Surgery

## 2013-12-30 ENCOUNTER — Encounter (HOSPITAL_BASED_OUTPATIENT_CLINIC_OR_DEPARTMENT_OTHER): Payer: Self-pay | Admitting: Certified Registered"

## 2013-12-30 DIAGNOSIS — Z9889 Other specified postprocedural states: Secondary | ICD-10-CM | POA: Insufficient documentation

## 2013-12-30 DIAGNOSIS — F1010 Alcohol abuse, uncomplicated: Secondary | ICD-10-CM | POA: Insufficient documentation

## 2013-12-30 DIAGNOSIS — Z880 Allergy status to penicillin: Secondary | ICD-10-CM | POA: Diagnosis not present

## 2013-12-30 DIAGNOSIS — Z79899 Other long term (current) drug therapy: Secondary | ICD-10-CM | POA: Insufficient documentation

## 2013-12-30 DIAGNOSIS — J4489 Other specified chronic obstructive pulmonary disease: Secondary | ICD-10-CM | POA: Insufficient documentation

## 2013-12-30 DIAGNOSIS — C50411 Malignant neoplasm of upper-outer quadrant of right female breast: Secondary | ICD-10-CM

## 2013-12-30 DIAGNOSIS — C50919 Malignant neoplasm of unspecified site of unspecified female breast: Secondary | ICD-10-CM | POA: Insufficient documentation

## 2013-12-30 DIAGNOSIS — J438 Other emphysema: Secondary | ICD-10-CM | POA: Insufficient documentation

## 2013-12-30 DIAGNOSIS — K219 Gastro-esophageal reflux disease without esophagitis: Secondary | ICD-10-CM | POA: Diagnosis not present

## 2013-12-30 DIAGNOSIS — IMO0002 Reserved for concepts with insufficient information to code with codable children: Secondary | ICD-10-CM | POA: Diagnosis not present

## 2013-12-30 DIAGNOSIS — J449 Chronic obstructive pulmonary disease, unspecified: Secondary | ICD-10-CM | POA: Diagnosis not present

## 2013-12-30 DIAGNOSIS — Z452 Encounter for adjustment and management of vascular access device: Secondary | ICD-10-CM | POA: Insufficient documentation

## 2013-12-30 DIAGNOSIS — Z791 Long term (current) use of non-steroidal anti-inflammatories (NSAID): Secondary | ICD-10-CM | POA: Insufficient documentation

## 2013-12-30 DIAGNOSIS — J45909 Unspecified asthma, uncomplicated: Secondary | ICD-10-CM | POA: Diagnosis not present

## 2013-12-30 DIAGNOSIS — F17200 Nicotine dependence, unspecified, uncomplicated: Secondary | ICD-10-CM | POA: Diagnosis not present

## 2013-12-30 HISTORY — DX: Presence of spectacles and contact lenses: Z97.3

## 2013-12-30 HISTORY — DX: Unspecified hearing loss, unspecified ear: H91.90

## 2013-12-30 HISTORY — PX: PORTACATH PLACEMENT: SHX2246

## 2013-12-30 HISTORY — DX: Disorder of written expression: F81.81

## 2013-12-30 HISTORY — DX: Specific reading disorder: F81.0

## 2013-12-30 HISTORY — DX: Presence of dental prosthetic device (complete) (partial): Z97.2

## 2013-12-30 SURGERY — INSERTION, TUNNELED CENTRAL VENOUS DEVICE, WITH PORT
Anesthesia: General | Site: Neck | Laterality: Left

## 2013-12-30 MED ORDER — BUPIVACAINE HCL (PF) 0.25 % IJ SOLN
INTRAMUSCULAR | Status: AC
  Filled 2013-12-30: qty 30

## 2013-12-30 MED ORDER — ONDANSETRON HCL 4 MG/2ML IJ SOLN: 4.0000 mg | Freq: Once | INTRAMUSCULAR | Status: DC | PRN

## 2013-12-30 MED ORDER — OXYCODONE HCL 5 MG PO TABS
5.0000 mg | ORAL_TABLET | Freq: Once | ORAL | Status: AC | PRN
  Administered 2013-12-30: 5 mg via ORAL

## 2013-12-30 MED ORDER — DEXAMETHASONE SODIUM PHOSPHATE 4 MG/ML IJ SOLN
INTRAMUSCULAR | Status: DC | PRN
  Administered 2013-12-30: 4 mg via INTRAVENOUS

## 2013-12-30 MED ORDER — LIDOCAINE HCL (CARDIAC) 20 MG/ML IV SOLN
INTRAVENOUS | Status: DC | PRN
  Administered 2013-12-30: 30 mg via INTRAVENOUS

## 2013-12-30 MED ORDER — FENTANYL CITRATE 0.05 MG/ML IJ SOLN: 50.0000 ug | INTRAMUSCULAR | Status: DC | PRN

## 2013-12-30 MED ORDER — BUPIVACAINE HCL (PF) 0.25 % IJ SOLN
INTRAMUSCULAR | Status: DC | PRN
  Administered 2013-12-30: 5 mL

## 2013-12-30 MED ORDER — HYDROMORPHONE HCL PF 1 MG/ML IJ SOLN
0.2500 mg | INTRAMUSCULAR | Status: DC | PRN
  Administered 2013-12-30: 0.5 mg via INTRAVENOUS

## 2013-12-30 MED ORDER — HYDROMORPHONE HCL PF 1 MG/ML IJ SOLN
INTRAMUSCULAR | Status: AC
  Filled 2013-12-30: qty 1

## 2013-12-30 MED ORDER — PROPOFOL 10 MG/ML IV BOLUS
INTRAVENOUS | Status: DC | PRN
  Administered 2013-12-30: 150 mg via INTRAVENOUS

## 2013-12-30 MED ORDER — HEPARIN (PORCINE) IN NACL 2-0.9 UNIT/ML-% IJ SOLN
INTRAMUSCULAR | Status: AC
  Filled 2013-12-30: qty 500

## 2013-12-30 MED ORDER — OXYCODONE-ACETAMINOPHEN 5-325 MG PO TABS: 1.0000 | ORAL_TABLET | ORAL | Status: DC | PRN

## 2013-12-30 MED ORDER — OXYCODONE HCL 5 MG PO TABS
ORAL_TABLET | ORAL | Status: AC
  Filled 2013-12-30: qty 1

## 2013-12-30 MED ORDER — ONDANSETRON HCL 4 MG/2ML IJ SOLN
INTRAMUSCULAR | Status: DC | PRN
  Administered 2013-12-30: 4 mg via INTRAVENOUS

## 2013-12-30 MED ORDER — HEPARIN SOD (PORK) LOCK FLUSH 100 UNIT/ML IV SOLN
INTRAVENOUS | Status: DC | PRN
  Administered 2013-12-30: 500 [IU] via INTRAVENOUS

## 2013-12-30 MED ORDER — CHLORHEXIDINE GLUCONATE 4 % EX LIQD: 1.0000 "application " | Freq: Once | CUTANEOUS | Status: DC

## 2013-12-30 MED ORDER — OXYCODONE HCL 5 MG/5ML PO SOLN: 5.0000 mg | Freq: Once | ORAL | Status: AC | PRN

## 2013-12-30 MED ORDER — VANCOMYCIN HCL IN DEXTROSE 1-5 GM/200ML-% IV SOLN
1000.0000 mg | INTRAVENOUS | Status: AC
  Administered 2013-12-30: 1000 mg via INTRAVENOUS

## 2013-12-30 MED ORDER — MIDAZOLAM HCL 2 MG/2ML IJ SOLN
INTRAMUSCULAR | Status: AC
  Filled 2013-12-30: qty 2

## 2013-12-30 MED ORDER — HEPARIN SOD (PORK) LOCK FLUSH 100 UNIT/ML IV SOLN
INTRAVENOUS | Status: AC
  Filled 2013-12-30: qty 5

## 2013-12-30 MED ORDER — LACTATED RINGERS IV SOLN
INTRAVENOUS | Status: DC
  Administered 2013-12-30: 13:00:00 via INTRAVENOUS

## 2013-12-30 MED ORDER — FENTANYL CITRATE 0.05 MG/ML IJ SOLN
INTRAMUSCULAR | Status: DC | PRN
  Administered 2013-12-30 (×2): 50 ug via INTRAVENOUS

## 2013-12-30 MED ORDER — MIDAZOLAM HCL 2 MG/2ML IJ SOLN: 1.0000 mg | INTRAMUSCULAR | Status: DC | PRN

## 2013-12-30 MED ORDER — HEPARIN (PORCINE) IN NACL 2-0.9 UNIT/ML-% IJ SOLN
INTRAMUSCULAR | Status: DC | PRN
  Administered 2013-12-30: 1 via INTRAVENOUS

## 2013-12-30 MED ORDER — FENTANYL CITRATE 0.05 MG/ML IJ SOLN
INTRAMUSCULAR | Status: AC
  Filled 2013-12-30: qty 6

## 2013-12-30 MED ORDER — MIDAZOLAM HCL 5 MG/5ML IJ SOLN
INTRAMUSCULAR | Status: DC | PRN
  Administered 2013-12-30: 2 mg via INTRAVENOUS

## 2013-12-30 MED ORDER — VANCOMYCIN HCL IN DEXTROSE 1-5 GM/200ML-% IV SOLN
INTRAVENOUS | Status: AC
  Filled 2013-12-30: qty 200

## 2013-12-30 SURGICAL SUPPLY — 54 items
ADH SKN CLS APL DERMABOND .7 (GAUZE/BANDAGES/DRESSINGS) ×1
BAG DECANTER FOR FLEXI CONT (MISCELLANEOUS) ×3 IMPLANT
BLADE SURG 15 STRL LF DISP TIS (BLADE) ×1 IMPLANT
BLADE SURG 15 STRL SS (BLADE) ×3
CANISTER SUCT 1200ML W/VALVE (MISCELLANEOUS) IMPLANT
CHLORAPREP W/TINT 26ML (MISCELLANEOUS) ×1 IMPLANT
CLEANER CAUTERY TIP 5X5 PAD (MISCELLANEOUS) ×1 IMPLANT
COVER MAYO STAND STRL (DRAPES) ×3 IMPLANT
COVER TABLE BACK 60X90 (DRAPES) ×3 IMPLANT
DECANTER SPIKE VIAL GLASS SM (MISCELLANEOUS) ×2 IMPLANT
DERMABOND ADVANCED (GAUZE/BANDAGES/DRESSINGS) ×2
DERMABOND ADVANCED .7 DNX12 (GAUZE/BANDAGES/DRESSINGS) ×1 IMPLANT
DRAPE C-ARM 42X72 X-RAY (DRAPES) ×3 IMPLANT
DRAPE LAPAROSCOPIC ABDOMINAL (DRAPES) ×3 IMPLANT
DRAPE UTILITY XL STRL (DRAPES) ×3 IMPLANT
ELECT REM PT RETURN 9FT ADLT (ELECTROSURGICAL) ×3
ELECTRODE REM PT RTRN 9FT ADLT (ELECTROSURGICAL) ×1 IMPLANT
GLOVE BIO SURGEON STRL SZ7.5 (GLOVE) ×3 IMPLANT
GLOVE BIOGEL M STRL SZ7.5 (GLOVE) ×2 IMPLANT
GLOVE BIOGEL PI IND STRL 8 (GLOVE) IMPLANT
GLOVE BIOGEL PI INDICATOR 8 (GLOVE) ×2
GOWN STRL REUS W/ TWL LRG LVL3 (GOWN DISPOSABLE) ×2 IMPLANT
GOWN STRL REUS W/ TWL XL LVL3 (GOWN DISPOSABLE) IMPLANT
GOWN STRL REUS W/TWL LRG LVL3 (GOWN DISPOSABLE) ×3
GOWN STRL REUS W/TWL XL LVL3 (GOWN DISPOSABLE) ×6
IV KIT MINILOC 20X1 SAFETY (NEEDLE) IMPLANT
KIT BARDPORT ISP (Port) IMPLANT
KIT PORT POWER 8FR ISP CVUE (Catheter) ×3 IMPLANT
NDL HYPO 25X1 1.5 SAFETY (NEEDLE) ×1 IMPLANT
NDL SAFETY ECLIPSE 18X1.5 (NEEDLE) IMPLANT
NDL SPNL 22GX3.5 QUINCKE BK (NEEDLE) IMPLANT
NEEDLE HYPO 18GX1.5 SHARP (NEEDLE)
NEEDLE HYPO 22GX1.5 SAFETY (NEEDLE) IMPLANT
NEEDLE HYPO 25X1 1.5 SAFETY (NEEDLE) ×3 IMPLANT
NEEDLE SPNL 22GX3.5 QUINCKE BK (NEEDLE) IMPLANT
PACK BASIN DAY SURGERY FS (CUSTOM PROCEDURE TRAY) ×3 IMPLANT
PAD CLEANER CAUTERY TIP 5X5 (MISCELLANEOUS) ×2
PENCIL BUTTON HOLSTER BLD 10FT (ELECTRODE) ×3 IMPLANT
SLEEVE SCD COMPRESS KNEE MED (MISCELLANEOUS) ×2 IMPLANT
SUT MON AB 4-0 PC3 18 (SUTURE) ×3 IMPLANT
SUT PROLENE 2 0 SH DA (SUTURE) ×3 IMPLANT
SUT SILK 2 0 TIES 17X18 (SUTURE)
SUT SILK 2-0 18XBRD TIE BLK (SUTURE) IMPLANT
SUT VIC AB 3-0 SH 27 (SUTURE) ×3
SUT VIC AB 3-0 SH 27X BRD (SUTURE) ×1 IMPLANT
SUT VICRYL 3-0 CR8 SH (SUTURE) ×2 IMPLANT
SYRINGE 6CC (MISCELLANEOUS) ×3 IMPLANT
SYRINGE CONTROL L 12CC (SYRINGE) ×6 IMPLANT
SYRINGE CONTROL LL 12CC (SYRINGE) ×2 IMPLANT
TOWEL OR 17X24 6PK STRL BLUE (TOWEL DISPOSABLE) ×4 IMPLANT
TOWEL OR NON WOVEN STRL DISP B (DISPOSABLE) ×3 IMPLANT
TUBE CONNECTING 20'X1/4 (TUBING)
TUBE CONNECTING 20X1/4 (TUBING) IMPLANT
YANKAUER SUCT BULB TIP NO VENT (SUCTIONS) IMPLANT

## 2013-12-30 NOTE — Op Note (Addendum)
12/30/2013  2:35 PM  PATIENT:  [name]  58 y.o. female  PRE-OPERATIVE DIAGNOSIS:  right breast cancer  POST-OPERATIVE DIAGNOSIS:  right breast cancer  PROCEDURE:  Procedure(s) with comments: INSERTION PORT-A-CATH (Left) - subclavian area  SURGEON:  Surgeon(s) and Role:    * Merrie Roof, MD - Primary  PHYSICIAN ASSISTANT:   ASSISTANTS: none   ANESTHESIA:   general  EBL:  Total I/O In: 800 [I.V.:800] Out: -   BLOOD ADMINISTERED:none  DRAINS: none   LOCAL MEDICATIONS USED:  MARCAINE     SPECIMEN:  No Specimen  DISPOSITION OF SPECIMEN:  N/A  COUNTS:  YES  TOURNIQUET:  * No tourniquets in log *  DICTATION: .Dragon Dictation After informed consent was obtained the patient was brought to the operating room placed in the supine position on the operating table. After adequate induction of general anesthesia, a roll was placed behind the patient's shoulder blades to extend the shoulder slightly. The chest and neck area were then prepped with ChloraPrep, allowed to dry, and draped in usual sterile manner. An 8 Fr attachable port kit was chosen. The patient was placed in Trendelenburg position. The area lateral to the bend of the clavicle on the left chest was infiltrated with quarter Marcaine. A small stab incision was made with a 15 blade knife just lateral to the bend of the clavicle. A large bore finder needle from the Port-A-Cath kit was then used to slide beneath the bend of the clavicle heading towards the sternal notch and in doing so we were able to access the left subclavian vein without difficulty. A wire was fed through the needle using the Seldinger technique without difficulty. The wire was confirmed in the central venous system using real-time fluoroscopy. Next the incision on the left chest was extended medially and laterally. A subcutaneous pocket was created inferior to the incision using blunt finger dissection and some sharp dissection with the  electrocautery. Next the tubing was placed on the reservoir and the reservoir was placed in the pocket. The length of the tubing was estimated using real-time fluoroscopy. The tubing was cut to the appropriate length. Next the sheath and dilator were fed over the wire using the Seldinger technique without difficulty. The dilator and wire were then removed. The tubing was fed through the sheath as far as it can be fed and then held in place while the sheath was gently cracked and separated. Another real-time fluoroscopy image showed the tip of the catheter to be in the distal superior vena cava. Next the tubing was permanently anchored to the reservoir. The reservoir was anchored in the subcutaneous pocket with 2 2-0 Prolene stitches. The port was aspirated and it aspirated blood easily. It was then flushed with a dilute heparin solution followed by a more concentrated heparin solution. The subcutaneous tissue was closed over the port with interrupted 3-0 Vicryl stitches. The skin was then closed with a running 4-0 Monocryl subcuticular stitch. Dermabond dressings were applied. The patient tolerated the procedure well. At the end of the case all needle sponge and instrument counts were correct. The patient was then awakened and taken to recovery in stable condition.  PLAN OF CARE: Discharge to home after PACU  PATIENT DISPOSITION:  PACU - hemodynamically stable.   Delay start of Pharmacological VTE agent (>24hrs) due to surgical blood loss or risk of bleeding: not applicable

## 2013-12-30 NOTE — Transfer of Care (Signed)
Immediate Anesthesia Transfer of Care Note  Patient: [name]  Procedure(s) Performed: Procedure(s) with comments: INSERTION PORT-A-CATH (Left) - subclavian area  Patient Location: PACU  Anesthesia Type:General  Level of Consciousness: awake, alert , oriented and patient cooperative  Airway & Oxygen Therapy: Patient Spontanous Breathing and Patient connected to face mask oxygen  Post-op Assessment: Report given to PACU RN and Post -op Vital signs reviewed and stable  Post vital signs: Reviewed and stable  Complications: No apparent anesthesia complications

## 2013-12-30 NOTE — Progress Notes (Signed)
Pt had incision on left chest for port-a-cath incision - site had Dermabond over it - no redness or drainage from site. Site charted as Right breast should be left chest. When checked at 1600 site - no redness or drainage, dermabond intact over site.

## 2013-12-30 NOTE — OR Nursing (Signed)
Site completion was charted as right breast.  The incision site was on the left chest.

## 2013-12-30 NOTE — Interval H&P Note (Signed)
History and Physical Interval Note:  12/30/2013 1:14 PM  [name]  has presented today for surgery, with the diagnosis of right breast cancer  The various methods of treatment have been discussed with the patient and family. After consideration of risks, benefits and other options for treatment, the patient has consented to  Procedure(s): INSERTION PORT-A-CATH (N/A) as a surgical intervention .  The patient's history has been reviewed, patient examined, no change in status, stable for surgery.  I have reviewed the patient's chart and labs.  Questions were answered to the patient's satisfaction.     TOTH III,Venisa Frampton S

## 2013-12-30 NOTE — Discharge Instructions (Signed)

## 2013-12-30 NOTE — Anesthesia Postprocedure Evaluation (Signed)
  Anesthesia Post-op Note  Patient: [name]  Procedure(s) Performed: Procedure(s) with comments: INSERTION PORT-A-CATH (Left) - subclavian area  Patient Location: PACU  Anesthesia Type:General  Level of Consciousness: awake, alert  and oriented  Airway and Oxygen Therapy: Patient Spontanous Breathing and Patient connected to nasal cannula oxygen  Post-op Pain: mild  Post-op Assessment: Post-op Vital signs reviewed, Patient's Cardiovascular Status Stable, Respiratory Function Stable, Patent Airway and Pain level controlled  Post-op Vital Signs: stable  Last Vitals:  Filed Vitals:   12/30/13 1500  BP: 139/75  Pulse: 76  Temp:   Resp: 20    Complications: No apparent anesthesia complications

## 2013-12-30 NOTE — Anesthesia Preprocedure Evaluation (Addendum)
Anesthesia Evaluation  Patient identified by MRN, date of birth, ID band Patient awake    Reviewed: Allergy & Precautions, H&P , NPO status , Patient's Chart, lab work & pertinent test results  Airway Mallampati: II TM Distance: >3 FB Neck ROM: Full    Dental  (+) Teeth Intact, Dental Advisory Given   Pulmonary Current Smoker,  breath sounds clear to auscultation        Cardiovascular Rhythm:Regular Rate:Normal     Neuro/Psych    GI/Hepatic   Endo/Other    Renal/GU      Musculoskeletal   Abdominal   Peds  Hematology   Anesthesia Other Findings   Reproductive/Obstetrics                          Anesthesia Physical Anesthesia Plan  ASA: III  Anesthesia Plan:    Post-op Pain Management:    Induction:   Airway Management Planned:   Additional Equipment:   Intra-op Plan:   Post-operative Plan:   Informed Consent: I have reviewed the patients History and Physical, chart, labs and discussed the procedure including the risks, benefits and alternatives for the proposed anesthesia with the patient or authorized representative who has indicated his/her understanding and acceptance.     Plan Discussed with: CRNA and Anesthesiologist  Anesthesia Plan Comments:        Anesthesia Quick Evaluation

## 2013-12-30 NOTE — H&P (View-Only) (Signed)
Patient ID: [name], female   DOB: 1956/01/31, 58 y.o.   MRN: [id number]  No chief complaint on file.   HPI [name] is a 58 y.o. female.  We're asked to see the patient in consultation by Dr. Conchita Paris to evaluate her for a right-sided breast cancer. The patient is a 58 year old black female who actually has a history of a left-sided breast cancer that was removed by me back in 2009. She states that she has felt a mass in her upper right breast for the last 2 years that has been slowly enlarging. She does have some soreness associated with it. She has not had any discharge from her nipple. She underwent ultrasound which estimated the size of 2 adjacent lesions to be 4.1 cm and 4.4 cm. Her MRI showed the extent of disease to measure 7.2 cm. She was a triple negative with her Ki-67 of 85%.  HPI  Past Medical History  Diagnosis Date  . Acid reflux   . Asthma   . COPD (chronic obstructive pulmonary disease)   . Cancer     lumpectomy, radiation 2009  . Seasonal allergies   . Alcohol abuse   . Elevated d-dimer     negative chest CT  . Allergy   . Emphysema of lung   . Hot flashes     Past Surgical History  Procedure Laterality Date  . Breast lumpectomy    . Abdominal hysterectomy      Family History  Problem Relation Age of Onset  . Hypertension Other     Social History History  Substance Use Topics  . Smoking status: Current Every Day Smoker -- 1.00 packs/day for 30 years    Types: Cigarettes  . Smokeless tobacco: Not on file  . Alcohol Use: Yes     Comment: occ beer    Allergies  Allergen Reactions  . Penicillins Other (See Comments)    Reaction a long time ago    Current Outpatient Prescriptions  Medication Sig Dispense Refill  . albuterol (PROVENTIL HFA;VENTOLIN HFA) 108 (90 BASE) MCG/ACT inhaler Inhale 2 puffs into the lungs every 6 (six) hours as needed for wheezing or shortness of breath. For shortness of breath  3 Inhaler  3  .  Bacitracin-Pramoxine HCl 500-10 UNIT-MG/GM OINT Apply 1 application topically 2 (two) times daily.  28 g  0  . carbamide peroxide (DEBROX) 6.5 % otic solution Place 5 drops into both ears 2 (two) times daily.  15 mL  1  . Clobetasol Prop Emollient Base 0.05 % emollient cream Apply 1 application topically 2 (two) times daily. until symptoms resolve. Use scalp applicator  30 g  5  . fluticasone (FLOVENT HFA) 220 MCG/ACT inhaler Inhale 1 puff into the lungs 2 (two) times daily.  3 Inhaler  3  . hydrOXYzine (ATARAX/VISTARIL) 25 MG tablet Take 1 tablet (25 mg total) by mouth every 8 (eight) hours as needed for itching.  60 tablet  2  . loratadine (CLARITIN) 10 MG tablet Take 1 tablet (10 mg total) by mouth daily.  30 tablet  10  . methylPREDNISolone (MEDROL DOSEPAK) 4 MG tablet As directed  21 tablet  0  . nicotine (NICODERM CQ) 21 mg/24hr patch Place 1 patch (21 mg total) onto the skin daily.  28 patch  0  . pantoprazole (PROTONIX) 40 MG tablet Take 1 tablet (40 mg total) by mouth daily.  30 tablet  2  . predniSONE (DELTASONE) 10 MG tablet Take 4 tabs daily  for 4 days, 3 tabs daily for 4 days, 2 tabs daily for 4 days, then 1 tab daily for 4 days.  40 tablet  0  . predniSONE (DELTASONE) 10 MG tablet 4 tabs daily for 5 days, 3 tabs daily for 4 days, 2 tabs daily for 3 days, 1 tab daily for 2 days  40 tablet  0  . silver sulfADIAZINE (SILVADENE) 1 % cream Apply topically 2 (two) times daily.  50 g  1  . sulfamethoxazole-trimethoprim (BACTRIM DS) 800-160 MG per tablet Take 1 tablet by mouth 2 (two) times daily.  20 tablet  0  . triamcinolone cream (KENALOG) 0.1 % Apply topically 2 (two) times daily.  80 g  5  . Vitamin D, Ergocalciferol, (DRISDOL) 50000 UNITS CAPS capsule Take 1 capsule (50,000 Units total) by mouth every 7 (seven) days.  12 capsule  0   No current facility-administered medications for this visit.    Review of Systems Review of Systems  Constitutional: Negative.   HENT: Negative.    Eyes: Negative.   Respiratory: Negative.   Cardiovascular: Negative.   Gastrointestinal: Negative.   Endocrine: Negative.   Genitourinary: Negative.   Musculoskeletal: Negative.   Skin: Negative.   Allergic/Immunologic: Negative.   Neurological: Negative.   Hematological: Negative.   Psychiatric/Behavioral: Negative.     There were no vitals taken for this visit.  Physical Exam Physical Exam  Constitutional: She is oriented to person, place, and time. She appears well-developed and well-nourished.  HENT:  Head: Normocephalic and atraumatic.  Eyes: Conjunctivae and EOM are normal. Pupils are equal, round, and reactive to light.  Neck: Normal range of motion. Neck supple.  Cardiovascular: Normal rate, regular rhythm and normal heart sounds.   Pulmonary/Chest: Effort normal and breath sounds normal.  There is a large palpable mass that is mobile in the upper outer right breast which measures approximate 7-8 cm. There are mobile but palpably enlarged lymph nodes in the right axilla. There is some palpable firm scar in the upper left breast at the site of her previous lumpectomy which is stable. There is no palpable left-sided axillary supraclavicular or cervical lymphadenopathy  Abdominal: Soft. Bowel sounds are normal.  Musculoskeletal: Normal range of motion.  Lymphadenopathy:    She has no cervical adenopathy.  Neurological: She is alert and oriented to person, place, and time.  Skin: Skin is warm and dry.  Psychiatric: She has a normal mood and affect. Her behavior is normal.    Data Reviewed As above  Assessment    The patient has a locally advanced right sided breast cancer with positive lymph nodes. I have talked to her in detail today about the different options for treatment. I think she would be a good candidate for neoadjuvant chemotherapy to try to shrink the tumor. From a surgical standpoint because of the size of the tumor and the positive lymph nodes I think she  would benefit from a modified radical mastectomy. I've discussed with her in detail the risks and benefits of the operation to do this as well as some of the technical aspects and she understands and wishes to proceed. She will also need a Port-A-Cath placed for the chemotherapy which we will do first.     Plan    Plan for placement of a Port-A-Cath for neoadjuvant chemotherapy followed most likely by right modified radical mastectomy        TOTH III,Krishang Reading S 12/28/2013, 1:47 PM

## 2013-12-30 NOTE — Anesthesia Procedure Notes (Signed)
Procedure Name: LMA Insertion Date/Time: 12/30/2013 1:41 PM Performed by: Macauley Mossberg Pre-anesthesia Checklist: Patient identified, Emergency Drugs available, Suction available and Patient being monitored Patient Re-evaluated:Patient Re-evaluated prior to inductionOxygen Delivery Method: Circle System Utilized Preoxygenation: Pre-oxygenation with 100% oxygen Intubation Type: IV induction Ventilation: Mask ventilation without difficulty LMA: LMA inserted LMA Size: 4.0 Number of attempts: 1 Airway Equipment and Method: bite block Placement Confirmation: positive ETCO2 Tube secured with: Tape Dental Injury: Teeth and Oropharynx as per pre-operative assessment

## 2014-01-02 ENCOUNTER — Encounter (HOSPITAL_BASED_OUTPATIENT_CLINIC_OR_DEPARTMENT_OTHER): Payer: Self-pay | Admitting: General Surgery

## 2014-01-02 ENCOUNTER — Other Ambulatory Visit: Payer: Self-pay | Admitting: *Deleted

## 2014-01-02 ENCOUNTER — Telehealth: Payer: Self-pay | Admitting: *Deleted

## 2014-01-02 ENCOUNTER — Telehealth: Payer: Self-pay | Admitting: Oncology

## 2014-01-02 NOTE — Telephone Encounter (Signed)
Spoke with patient's daughter from [facility] 12/28/13.  She is aware of her mother's upcoming appointments.  Informed her she would be getting a call about her echo and chemo class appointment.  Encouraged her to call with any concerns or needs.

## 2014-01-02 NOTE — Telephone Encounter (Signed)
per pof to sch appt ECHO & chemo-cld &spoke with daughter [name] and gave time & dates of appt-daughter understood

## 2014-01-03 ENCOUNTER — Encounter: Payer: Self-pay | Admitting: *Deleted

## 2014-01-03 NOTE — Progress Notes (Signed)
[place] [facility]  Clinical Social Work was referred by Therapist, sports to review and complete healthcare advance directives.  Clinical Social Worker met with patient and patient's daughter in [place] office.  The patient designated her daughter as their primary healthcare agent.  Patient also completed healthcare living will.    Clinical Social Worker notarized documents and made copies for patient/family. Clinical Social Worker will send documents to medical records to be scanned into patient's chart. Clinical Social Worker encouraged patient/family to contact with any additional questions or concerns.  Johnnye Lana, MSW, [place] Worker [facility] [phone]

## 2014-01-04 ENCOUNTER — Encounter: Payer: Self-pay | Admitting: *Deleted

## 2014-01-04 ENCOUNTER — Other Ambulatory Visit: Payer: No Typology Code available for payment source

## 2014-01-04 ENCOUNTER — Ambulatory Visit (HOSPITAL_COMMUNITY)
Admission: RE | Admit: 2014-01-04 | Discharge: 2014-01-04 | Disposition: A | Discharge status: Home / Self Care | Payer: Medicaid Other | Source: Ambulatory Visit | Attending: General Surgery | Admitting: General Surgery

## 2014-01-04 ENCOUNTER — Ambulatory Visit (HOSPITAL_COMMUNITY)
Admission: RE | Admit: 2014-01-04 | Discharge: 2014-01-04 | Disposition: A | Discharge status: Home / Self Care | Payer: Medicaid Other | Source: Ambulatory Visit | Attending: Oncology | Admitting: Oncology

## 2014-01-04 DIAGNOSIS — J3489 Other specified disorders of nose and nasal sinuses: Secondary | ICD-10-CM | POA: Insufficient documentation

## 2014-01-04 DIAGNOSIS — R112 Nausea with vomiting, unspecified: Secondary | ICD-10-CM | POA: Insufficient documentation

## 2014-01-04 DIAGNOSIS — I6789 Other cerebrovascular disease: Secondary | ICD-10-CM | POA: Insufficient documentation

## 2014-01-04 DIAGNOSIS — C50411 Malignant neoplasm of upper-outer quadrant of right female breast: Secondary | ICD-10-CM

## 2014-01-04 DIAGNOSIS — R51 Headache: Secondary | ICD-10-CM | POA: Diagnosis not present

## 2014-01-04 DIAGNOSIS — G936 Cerebral edema: Secondary | ICD-10-CM | POA: Diagnosis not present

## 2014-01-04 DIAGNOSIS — H04139 Lacrimal cyst, unspecified lacrimal gland: Secondary | ICD-10-CM | POA: Insufficient documentation

## 2014-01-04 DIAGNOSIS — Z01810 Encounter for preprocedural cardiovascular examination: Secondary | ICD-10-CM | POA: Diagnosis not present

## 2014-01-04 DIAGNOSIS — G9389 Other specified disorders of brain: Secondary | ICD-10-CM | POA: Insufficient documentation

## 2014-01-04 DIAGNOSIS — C50919 Malignant neoplasm of unspecified site of unspecified female breast: Secondary | ICD-10-CM | POA: Diagnosis not present

## 2014-01-04 MED ORDER — GADOBENATE DIMEGLUMINE 529 MG/ML IV SOLN
9.0000 mL | Freq: Once | INTRAVENOUS | Status: AC | PRN
  Administered 2014-01-04: 9 mL via INTRAVENOUS

## 2014-01-04 NOTE — Progress Notes (Signed)
  Echocardiogram 2D Echocardiogram has been performed.  Donata Clay 01/04/2014, 11:45 AM

## 2014-01-05 ENCOUNTER — Other Ambulatory Visit: Payer: Self-pay

## 2014-01-05 ENCOUNTER — Telehealth: Payer: Self-pay | Admitting: *Deleted

## 2014-01-05 ENCOUNTER — Encounter (HOSPITAL_COMMUNITY)
Admission: RE | Admit: 2014-01-05 | Discharge: 2014-01-05 | Disposition: A | Discharge status: Home / Self Care | Payer: Medicaid Other | Source: Ambulatory Visit | Attending: Oncology | Admitting: Oncology

## 2014-01-05 ENCOUNTER — Other Ambulatory Visit: Payer: Self-pay | Admitting: Radiation Therapy

## 2014-01-05 ENCOUNTER — Other Ambulatory Visit: Payer: Self-pay | Admitting: *Deleted

## 2014-01-05 ENCOUNTER — Ambulatory Visit (HOSPITAL_BASED_OUTPATIENT_CLINIC_OR_DEPARTMENT_OTHER): Payer: Medicaid Other | Admitting: Oncology

## 2014-01-05 ENCOUNTER — Ambulatory Visit
Admission: RE | Admit: 2014-01-05 | Discharge: 2014-01-05 | Disposition: A | Discharge status: Home / Self Care | Payer: Medicaid Other | Source: Ambulatory Visit | Attending: Radiation Oncology | Admitting: Radiation Oncology

## 2014-01-05 ENCOUNTER — Encounter: Payer: Self-pay | Admitting: Radiation Oncology

## 2014-01-05 ENCOUNTER — Other Ambulatory Visit: Payer: Self-pay | Admitting: Oncology

## 2014-01-05 VITALS — BP 142/65 | HR 66 | Temp 98.0°F | Ht 59.0 in | Wt 108.0 lb

## 2014-01-05 DIAGNOSIS — C50419 Malignant neoplasm of upper-outer quadrant of unspecified female breast: Secondary | ICD-10-CM | POA: Insufficient documentation

## 2014-01-05 DIAGNOSIS — Z510 Encounter for antineoplastic radiation therapy: Secondary | ICD-10-CM | POA: Insufficient documentation

## 2014-01-05 DIAGNOSIS — Z79899 Other long term (current) drug therapy: Secondary | ICD-10-CM | POA: Insufficient documentation

## 2014-01-05 DIAGNOSIS — Z171 Estrogen receptor negative status [ER-]: Secondary | ICD-10-CM

## 2014-01-05 DIAGNOSIS — C7951 Secondary malignant neoplasm of bone: Secondary | ICD-10-CM

## 2014-01-05 DIAGNOSIS — C7952 Secondary malignant neoplasm of bone marrow: Secondary | ICD-10-CM | POA: Diagnosis not present

## 2014-01-05 DIAGNOSIS — C7931 Secondary malignant neoplasm of brain: Secondary | ICD-10-CM

## 2014-01-05 DIAGNOSIS — C7949 Secondary malignant neoplasm of other parts of nervous system: Secondary | ICD-10-CM

## 2014-01-05 DIAGNOSIS — C50919 Malignant neoplasm of unspecified site of unspecified female breast: Secondary | ICD-10-CM | POA: Insufficient documentation

## 2014-01-05 DIAGNOSIS — C7800 Secondary malignant neoplasm of unspecified lung: Secondary | ICD-10-CM

## 2014-01-05 DIAGNOSIS — C773 Secondary and unspecified malignant neoplasm of axilla and upper limb lymph nodes: Secondary | ICD-10-CM

## 2014-01-05 DIAGNOSIS — C50411 Malignant neoplasm of upper-outer quadrant of right female breast: Secondary | ICD-10-CM

## 2014-01-05 LAB — GLUCOSE, CAPILLARY: GLUCOSE-CAPILLARY: 104 mg/dL — AB (ref 70–99)

## 2014-01-05 MED ORDER — FLUDEOXYGLUCOSE F - 18 (FDG) INJECTION
6.3000 | Freq: Once | INTRAVENOUS | Status: AC | PRN
  Administered 2014-01-05: 6.3 via INTRAVENOUS

## 2014-01-05 MED ORDER — DEXAMETHASONE 4 MG PO TABS: ORAL_TABLET | ORAL | Status: DC

## 2014-01-05 MED ORDER — HYDROMORPHONE HCL 4 MG PO TABS: 4.0000 mg | ORAL_TABLET | ORAL | Status: DC | PRN

## 2014-01-05 MED ORDER — DEXAMETHASONE 4 MG PO TABS
8.0000 mg | ORAL_TABLET | Freq: Once | ORAL | Status: AC
  Administered 2014-01-05: 8 mg via ORAL
  Filled 2014-01-05: qty 2

## 2014-01-05 NOTE — Progress Notes (Signed)
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 The patient then had routine yearly mammography until 10/24/2009.  Sometime in 2014 the patient tells me she noted a mass in her right breast. She states she brought this to medical attention several times, although I do not see that reflected in the outside notes we have. In any case on 12/06/2013 the patient presented to her primary clinic with a complaint of left-sided breast pain. Exam showed the right breast however to be larger than the left. There was a thickened area versus lump at the 12:00 position in the right breast above the areola. There was also a mass in the right upper quadrant of the right breast. The patient was referred to the breast Center and on 6:30 at 2015 bilateral diagnostic mammography and right breast ultrasonography at the breast Center showed a 6 cm area in the right breast upper outer quadrant with increased density associated with branching calcifications. There was a separate area more medially that also appeared abnormal. There was diffuse skin thickening and interval enlarged right axillary lymph nodes. On exam the mammographer was able to palpate a 5 cm firm fixed palpable mass in the upper-outer  quadrant of the right breast, but mild skin redness. There was also a palpable right axillary lymph node. Ultrasound confirmed a large irregular hypoechoic mass at the 11:00 position of the right breast measuring 4.1 cm maximally. More laterally a separate mass in the 9:00 position measure 4.4 cm maximally. In the right axilla there were multiple abnormal appearing axillary lymph nodes.  Biopsy of both these suspicious masses in the right breast as well as one of the suspicious lymph nodes 12/21/2013 showed ([id number]) all 3 biopsies do show identical invasive ductal carcinoma, grade 3, triple negative, with an MIB-1 of 85%. On 12/26/2013 the patient underwent bilateral breast MRI. This showed an abnormal area measuring 7.2 cm in the central right breast involving or 4 quadrants and centered superiorly, with diffuse skin thickening as well as level I and 2 metastatic right axillary adenopathy. There was also an enlarged right internal mammary lymph node. The left side was unremarkable.  The patient's subsequent history is as detailed below  INTERVAL HISTORY: I met today for an unscheduled visit with [name] accompanied by her daughter [name]. This morning I receive the reports of [name]'s brain MRI, which showed evidence of metastatic disease. We called her home and told her to start dexamethasone 4 mg 2 tablets immediately. Recall the prescription to her pharmacy. She was already scheduled for a PET scan at 2 PM today, so we asked her to come right after the PET scan. Unfortunately the PET scan also shows lung and bone involvement. I contacted radiation oncology in the brain groups navigator [name] also met with [place] I discussed the overall situation with the patient  REVIEW OF SYSTEMS:  [name] now tells [place] that her headaches have been present for several months instead of several weeks. She "just thought there were sinus". She has had no visual changes, no nausea or vomiting, no focal weakness, and  no dizziness or gait imbalance.  PAST MEDICAL HISTORY: Past Medical History  Diagnosis Date  . Acid reflux   . Asthma   . COPD (chronic obstructive pulmonary disease)   . Cancer     lumpectomy, radiation 2009  . Seasonal allergies   . Alcohol abuse   . Elevated d-dimer     negative chest CT  . Allergy   . Emphysema of lung   . Hot flashes   . Reading disorder   . Impaired writing skills   . Wears glasses   . Wears dentures     top  . HOH (hard of hearing)     left ear    PAST SURGICAL HISTORY: Past Surgical History  Procedure Laterality Date  . Breast lumpectomy    . Abdominal hysterectomy    . Portacath placement Left 12/30/2013    Procedure: INSERTION PORT-A-CATH;  Surgeon: Diana Roof, MD;  Location: [facility];  Service: General;  Laterality: Left;  subclavian area    FAMILY HISTORY Family History  Problem Relation Age of Onset  . Hypertension Other    the patient has little information about her father or his side of the family. The patient's mother died at the age of 90. The patient had 2 brothers and 4 sisters. There is no history of breast or ovarian cancer in the family to her knowledge.  GYNECOLOGIC HISTORY:  No LMP recorded. Patient has had a hysterectomy. The patient does not remember when she started menstruating. First live birth age 1. She is GX P1. She underwent hysterectomy approximately 12 years ago, but does not know whether she had bilateral salpingo-oophorectomy. 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 She is married but separated from her husband [name], who lives in [place]. The patient lives by herself, with no pets. Her daughter [name] works as a Stage manager for [employer] care in [place] the patient has 3 grandchildren and 2 great-grandchildren. She is a Psychologist, forensic.    ADVANCED DIRECTIVES: Not in place; at her  12/28/2013 visit the patient was given the appropriate documents to complete and notarize so she may declare a healthcare power of attorney at her discretion   HEALTH MAINTENANCE: History  Substance Use Topics  . Smoking status: Current Every Day Smoker -- 1.00 packs/day for 30 years    Types: Cigarettes  . Smokeless tobacco: Not on file  . Alcohol Use: Yes     Comment: occ beer     Colonoscopy: Never  PAP: Status post hysterectomy  Bone density: Never  Lipid panel:  Allergies  Allergen Reactions  . Penicillins Other (See Comments)    Reaction a long time ago    Current Outpatient Prescriptions  Medication Sig Dispense Refill  . albuterol (PROVENTIL HFA;VENTOLIN HFA) 108 (90 BASE) MCG/ACT inhaler Inhale 2 puffs into the lungs every 6 (six) hours as needed for wheezing or shortness of breath. For shortness of breath  3 Inhaler  3  . Aspirin-Acetaminophen-Caffeine (GOODYS EXTRA STRENGTH) [phone] MG PACK Take by mouth.      . carbamide peroxide (DEBROX) 6.5 % otic solution Place 5 drops into both ears 2 (two) times daily.  15 mL  1  . Clobetasol Prop Emollient Base 0.05 % emollient cream Apply 1 application topically 2 (two) times daily. until symptoms resolve. Use scalp applicator  30 g  5  . dexamethasone (DECADRON) 4 MG tablet 2 tabs po bid TAKE FIRST DOSE ASAP  40 tablet  3  . fluticasone (FLOVENT HFA) 220 MCG/ACT inhaler Inhale 1 puff into the lungs 2 (two) times daily.  3 Inhaler  3  . HYDROmorphone (DILAUDID) 4 MG tablet Take 1 tablet (4 mg total) by mouth every 4 (four) hours as needed for severe pain.  60 tablet  0  . hydrOXYzine (ATARAX/VISTARIL) 25 MG tablet Take 1 tablet (25 mg total) by mouth every 8 (eight) hours as needed for itching.  60 tablet  2  . loratadine (CLARITIN) 10 MG tablet Take 1 tablet (10 mg total) by mouth daily.  30 tablet  10  . nicotine (NICODERM CQ) 21 mg/24hr patch Place 1 patch (21 mg total) onto the skin daily.  28 patch  0  .  oxyCODONE-acetaminophen (ROXICET) 5-325 MG per tablet Take 1-2 tablets by mouth every 4 (four) hours as needed.  50 tablet  0  . pantoprazole (PROTONIX) 40 MG tablet Take 1 tablet (40 mg total) by mouth daily.  30 tablet  2  . Vitamin D, Ergocalciferol, (DRISDOL) 50000 UNITS CAPS capsule Take 1 capsule (50,000 Units total) by mouth every 7 (seven) days.  12 capsule  0   No current facility-administered medications for this visit.    OBJECTIVE: Middle-aged white African American woman who appears older than stated age There were no vitals filed for this visit.   There is no weight on file to calculate BMI.    ECOG FS:1 - Symptomatic but completely ambulatory  Not repeated: As of 12/28/2013-- Ocular: Sclerae unicteric, pupils are round and equal Ear-nose-throat: Oropharynx clear, dentition in poor repair Lymphatic: No cervical or supraclavicular adenopathy Lungs no rales or rhonchi Heart regular rate and rhythm, no murmur appreciated Abd soft, nontender, positive bowel sounds MSK no focal spinal tenderness, no joint edema Neuro: non-focal, well-oriented, appropriate affect Breasts: The right breast is enlarged. There is an area of skin induration and change in the lateral aspect near the axilla. There is a palpable mass that is movable chiefly in the upper outer quadrant of the breast and measures more than 5 cm. There is no nipple discharge. There is palpable adenopathy in the right axilla. The left breast is status post lumpectomy. There is no evidence of recurrence. Left axilla is benign.   LAB RESULTS:  CMP     Component Value Date/Time   NA 137 12/28/2013 1229   NA 139 10/18/2013 1438   K 5.2* 12/28/2013 1229   K 5.0 10/18/2013 1438   CL 104 10/18/2013 1438   CO2 20* 12/28/2013 1229   CO2 26 10/18/2013 1438   GLUCOSE 101 12/28/2013 1229   GLUCOSE 85 10/18/2013 1438   BUN 6.1* 12/28/2013 1229   BUN 8 10/18/2013 1438   CREATININE 0.8 12/28/2013 1229   CREATININE 0.76 10/18/2013 1438   CREATININE  0.56 02/25/2012 0853   CALCIUM 9.2 12/28/2013 1229   CALCIUM 9.6 10/18/2013 1438   PROT 7.4 12/28/2013 1229   PROT 6.9 10/18/2013 1438   ALBUMIN 3.9 12/28/2013 1229   ALBUMIN 4.0 10/18/2013 1438   AST 26 12/28/2013 1229   AST 19 10/18/2013 1438   ALT 9 12/28/2013 1229   ALT 10 10/18/2013 1438   ALKPHOS 96 12/28/2013 1229  ALKPHOS 82 10/18/2013 1438   BILITOT 0.20 12/28/2013 1229   BILITOT 0.2 10/18/2013 1438   GFRNONAA 87 10/18/2013 1438   GFRNONAA >90 02/25/2012 0853   GFRAA >89 10/18/2013 1438   GFRAA >90 02/25/2012 0853    I No results found for this basename: SPEP,  UPEP,   kappa and lambda light chains    Lab Results  Component Value Date   WBC 8.0 12/28/2013   NEUTROABS 4.4 12/28/2013   HGB 14.0 12/28/2013   HCT 43.1 12/28/2013   MCV 98.1 12/28/2013   PLT 224 12/28/2013      Chemistry      Component Value Date/Time   NA 137 12/28/2013 1229   NA 139 10/18/2013 1438   K 5.2* 12/28/2013 1229   K 5.0 10/18/2013 1438   CL 104 10/18/2013 1438   CO2 20* 12/28/2013 1229   CO2 26 10/18/2013 1438   BUN 6.1* 12/28/2013 1229   BUN 8 10/18/2013 1438   CREATININE 0.8 12/28/2013 1229   CREATININE 0.76 10/18/2013 1438   CREATININE 0.56 02/25/2012 0853      Component Value Date/Time   CALCIUM 9.2 12/28/2013 1229   CALCIUM 9.6 10/18/2013 1438   ALKPHOS 96 12/28/2013 1229   ALKPHOS 82 10/18/2013 1438   AST 26 12/28/2013 1229   AST 19 10/18/2013 1438   ALT 9 12/28/2013 1229   ALT 10 10/18/2013 1438   BILITOT 0.20 12/28/2013 1229   BILITOT 0.2 10/18/2013 1438       No results found for this basename: LABCA2    No components found with this basename: LABCA125    No results found for this basename: INR,  in the last 168 hours  Urinalysis    Component Value Date/Time   COLORURINE YELLOW 12/03/2012 1601   APPEARANCEUR CLEAR 12/03/2012 1601   LABSPEC 1.018 12/03/2012 1601   PHURINE 6.0 12/03/2012 1601   GLUCOSEU NEG 12/03/2012 1601   GLUCOSEU NEG mg/dL 07/09/2007 2022   HGBUR NEG 12/03/2012 1601   BILIRUBINUR NEG 12/03/2012  1601   KETONESUR NEG 12/03/2012 1601   PROTEINUR NEG 12/03/2012 1601   UROBILINOGEN 0.2 12/03/2012 1601   NITRITE NEG 12/03/2012 1601   LEUKOCYTESUR SMALL* 12/03/2012 1601    STUDIES: Mr Jeri Cos Wo Contrast  02-04-2014   CLINICAL DATA:  History of breast cancer.  Headaches.  EXAM: MRI HEAD WITHOUT AND WITH CONTRAST  TECHNIQUE: Multiplanar, multiecho pulse sequences of the brain and surrounding structures were obtained without and with intravenous contrast.  CONTRAST:  39m MULTIHANCE GADOBENATE DIMEGLUMINE 529 MG/ML IV SOLN  COMPARISON:  07/25/2008 head CT.  No comparison MR.  FINDINGS: Per MR technologist, patient became nauseous and vomited after receiving contrast.  1.5 x 1.3 x 1.4 cm enhancing partially hemorrhagic lesion posterior left operculum region at the inferior border of the left frontal -parietal junction with mild surrounding vasogenic edema. Although this is peripherally located, this appears to be intra-axial rather than extra-axial (and therefore felt not to represent a meningioma). Given the patient's history, this is suspicious for intracranial metastatic disease with primary brain tumor a secondary less likely consideration.  Heterogeneous appearance of the calvarium and bone marrow of the visualized aspects of the cervical spine. Although it is possible patient's habitus, osteoporosis and/or anemia partially contribute to this appearance findings are suspicious for a osseous metastatic disease with most notable metastatic lesion occipital aspect of the calvarium.  Small vessel disease type changes subcortical and periventricular region as well as involving the pons.  No acute infarct.  No intracranial hemorrhage separate from the above described lesion.  Opacification left sphenoid sinus with surrounding bony thickening similar to the prior CT suggesting chronic sinusitis type changes with inspissation of centrally contain material. Fungal disease can cause a similar appearance on MR but  felt to be on likely given the chronicity findings.  Small lobulated cystic appearing aspect of the left lacrimal gland of questionable etiology.  No hydrocephalus.  Right vertebral artery appears small and may terminate in a posterior inferior cerebellar artery distribution. Small basilar artery. Internal carotid arteries are patent.  IMPRESSION: 1.5 x 1.3 x 1.4 cm enhancing partially hemorrhagic lesion posterior left operculum region at the inferior border of the left frontal -parietal junction with mild surrounding vasogenic edema. Given the patient's history, this is suspicious for intracranial metastatic disease.  Findings are suspicious for a osseous metastatic disease with most notable metastatic lesion occipital aspect of the calvarium.  Small vessel disease type changes.  No acute infarct.  Opacification left sphenoid sinus with surrounding bony thickening similar to the prior CT suggesting chronic sinusitis type changes with inspissation of centrally contain material.  Small lobulated cystic appearing aspect of the left lacrimal gland of questionable etiology.  Per MR technologist, patient became nauseous and vomited after receiving contrast.  Please see above for further detail.  These results will be called to the ordering clinician or representative by the Radiologist Assistant, and communication documented in the PACS or zVision Dashboard.   Electronically Signed   By: [name] M.D.   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 Status post left lumpectomy and radiation therapy for breast cancer in 2009.  LABS:  None obtained today.  EXAM: BILATERAL BREAST MRI WITH AND WITHOUT CONTRAST  TECHNIQUE: Multiplanar, multisequence MR images of both breasts were obtained prior to and following the intravenous administration of 78m of MultiHance.   THREE-DIMENSIONAL MR IMAGE RENDERING ON INDEPENDENT WORKSTATION:  Three-dimensional MR images were rendered by post-processing of the original MR data on an independent workstation. The three-dimensional MR images were interpreted, and findings are reported in the following complete MRI report for this study. Three dimensional images were evaluated at the independent DynaCad workstation  COMPARISON:  Recent mammogram, ultrasound and biopsy examinations.  FINDINGS: Breast composition: c:  Heterogeneous fibroglandular tissue  Background parenchymal enhancement: Minimal  Right breast: Large, irregular enhancing mass in the central right breast, involving all four quadrants, centered superiorly. This extends to the posterior aspect of the nipple without evidence of extension into the nipple. This is not involving the pectoralis muscle. This measures 7.2 x 4.5 x 3.7 cm in maximum dimensions. This contains biopsy marker clip artifacts posteriorly and laterally. There is also diffuse right breast skin thickening throughout the upper half of the breast with associated abnormal skin enhancement.  Left breast: Post lumpectomy changes with no mass or enhancement suspicious for malignancy.  Lymph nodes: Multiple abnormal appearing right axillary lymph nodes with loss of the normal fatty hila. These include level 1 nodes and a 10 mm short axis node extending posterior to the lateral aspect of the pectoralis minor muscle on inversion recovery image number 3. There is also an abnormal appearing right internal mammary lymph node with a short axis diameter of 5 mm on image number 59 of series 13.  Ancillary findings:  None.  IMPRESSION: 1. 7.2 x 4.5 x 3.7 cm biopsy-proven invasive ductal carcinoma in in the central right breast, involving all 4 quadrants, centered superiorly. 2. Diffuse skin thickening and enhancement in the superior half of the right breast, suspicious for dermal invasion of malignancy. 3. Level 1 and level 2  metastatic right axillary adenopathy. 4. Metastatic right internal mammary adenopathy. 5. Post lumpectomy changes on the left without evidence of recurrent malignancy in the left breast.  RECOMMENDATION: Treatment plan.  BI-RADS CATEGORY  6: Known biopsy-proven malignancy.   Electronically Signed   By: Enrique Sack M.D.   On: 12/26/2013 15:33   Nm Pet Image Initial (pi) Skull Base To Thigh  01/05/2014   CLINICAL DATA:  Initial treatment strategy for right breast carcinoma. Personal history of left breast carcinoma.  EXAM: NUCLEAR MEDICINE PET SKULL BASE TO THIGH  TECHNIQUE: 6.3 mCi F-18 FDG was injected intravenously. Full-ring PET imaging was performed from the skull base to thigh after the radiotracer. CT data was obtained and used for attenuation correction and anatomic localization.  FASTING BLOOD GLUCOSE:  Value: 104 mg/dl  COMPARISON:  12/20/2008  FINDINGS: NECK  No hypermetabolic lymph nodes in the neck.  CHEST  4 cm mass in the right breast shows hypermetabolic activity, with SUV max of 4.8. There is also diffuse hypermetabolic skin thickening involving the right breast, consistent with inflammatory breast carcinoma.  Mild hypermetabolic lymphadenopathy is seen within the right axilla, right hilum, and mediastinum in the right paratracheal region. SUV max seen in the right paratracheal region measures 8.2. No hypermetabolic lymphadenopathy seen along the internal mammary chains or left hilum.  A spiculated nodule is seen in the medial left upper lobe which measures 2.7 cm, and is having maximum SUV of 9.0. The morphology favors a primary bronchogenic carcinoma over pulmonary metastases. No other hypermetabolic pulmonary nodules are identified.  ABDOMEN/PELVIS  No abnormal hypermetabolic activity within the liver, pancreas, adrenal glands, or spleen. No hypermetabolic lymph nodes in the abdomen or pelvis.  SKELETON  Diffuse hypermetabolic bone metastases are seen throughout the skeleton.  IMPRESSION:  Hypermetabolic 4 cm right breast mass and overlying skin thickening, consistent with inflammatory breast carcinoma.  2.7 cm hypermetabolic spiculated nodule in the right upper lobe, with morphology favoring a primary breast carcinoma over pulmonary metastases.  Hypermetabolic lymphadenopathy in the right axilla, right hilum, and right paratracheal region, consistent with metastatic disease.  Diffuse bone metastases.   Electronically Signed   By: Earle Gell M.D.   On: 01/05/2014 16:10   Dg Chest Port 1 View  12/30/2013   CLINICAL DATA:  Port-A-Cath placement.  EXAM: PORTABLE CHEST - 1 VIEW  COMPARISON:  10/14/2010  FINDINGS: Left subclavian Port-A-Cath has been placed, with tip overlying the lower SVC. Cardiomediastinal silhouette is within normal limits. Thoracic aortic calcification is noted. The lungs are mildly hyperinflated without evidence confluent airspace opacity, pulmonary edema, pleural effusion, or pneumothorax. No acute osseous abnormality is identified.  IMPRESSION: Left Port-A-Cath as above.   Electronically Signed   By: Logan Bores   On: 12/30/2013 15:00   Mm Digital Diagnostic Bilat  12/13/2013   CLINICAL DATA:  Status post left lumpectomy and radiation therapy for breast cancer in 2009.  EXAM: DIGITAL DIAGNOSTIC  BILATERAL MAMMOGRAM WITH CAD  ULTRASOUND RIGHT BREAST  COMPARISON:  Previous examinations.  ACR Breast Density Category c: The breast tissue is heterogeneously dense, which may obscure small masses.  FINDINGS: Post lumpectomy changes are again demonstrated on the left with progressive calcification associated  with fat necrosis.  On the right, there has been a diffuse increase in density throughout the central portion of the breast, centered in the upper-outer quadrant, measuring approximately 6 cm in maximum diameter. There are associated interval irregular, linear, branching calcifications within this area and extending more posteriorly. There is also a separate area of similar  appearing calcifications in the posterior aspect of the breast, slightly medially and superiorly. There is also interval diffuse skin thickening in the anterior, medial and inferior portions of the right breast. There are also interval enlarged right axillary lymph nodes.  Mammographic images were processed with CAD.  On physical exam, the patient has an approximately 5 cm rounded, firm, fixed, palpable mass centered in the upper-outer quadrant of the right breast. There is mild skin redness in that area. There is also a faintly palpable right axillary lymph node.  Ultrasound is performed, showing a large, irregular, hypoechoic mass in the 11 o'clock of the right breast, 1 cm from the nipple, measuring approximately 4.1 x 3.3 x 2.8 cm in maximum dimensions. There is associated dense posterior acoustical shadowing.  More laterally in the right breast, in the 9 o'clock position, 6 cm from the nipple, there is an approximately 4.4 x 3.5 x 1.7 cm irregular hypoechoic area with milder posterior acoustical shadowing.  In the right axilla, multiple abnormal appearing right axillary lymph nodes are demonstrated with areas of marked focal cortical thickening measuring up to 9 mm in thickness.  IMPRESSION: 1. Interval changes of extensive, multifocal and multicentric malignancy involving a large portion of the right breast, centered in the upper-outer quadrant. 2. Interval metastatic right axillary adenopathy. 3. Interval right breast skin thickening compatible with lymphatic obstruction and possible dermal extension of tumor.  RECOMMENDATION: Ultrasound-guided core needle biopsies of the masses seen in the 11 o'clock and 9 o'clock positions of the right breast ultrasound and ultrasound-guided core needle biopsy of the largest abnormal appearing right axillary lymph node. These have been scheduled for 9 a.m. on 12/26/2013.  I have discussed the findings and recommendations with the patient. Results were also provided in  writing at the conclusion of the visit. If applicable, a reminder letter will be sent to the patient regarding the next appointment.  BI-RADS CATEGORY  5: Highly suggestive of malignancy.   Electronically Signed   By: Enrique Sack M.D.   On: 12/13/2013 14:20   Mm Digital Diagnostic Unilat R  12/21/2013   CLINICAL DATA:  Status post right breast ultrasound-guided core needle biopsies.  EXAM: DIAGNOSTIC RIGHT MAMMOGRAM POST ULTRASOUND BIOPSY  COMPARISON:  Previous exams  FINDINGS: Mammographic images were obtained following ultrasound guided biopsy of a 4.1 cm mass in the 11 o'clock position of the right breast and a 4.4 cm irregular hypoechoic area in the 9 o'clock position of the right breast. These demonstrate a ribbon shaped biopsy marker at the inferior aspect of the irregular microcalcifications seen within an area of increased density in the upper outer right breast at recent mammography, corresponding to the biopsied 4.1 cm mass in the 11 o'clock position of the breast. A wing shaped clip is located 2 cm more laterally in the breast, corresponding to the biopsied 4.4 cm irregular hypoechoic area in the 9 o'clock position of the breast. Diffuse breast skin thickening and edema is again demonstrated.  IMPRESSION: Appropriate deployment of ribbon shaped and wing shaped biopsy marker clips, as described above.  Final Assessment: Post Procedure Mammograms for Marker Placement   Electronically Signed   By:  Enrique Sack M.D.   On: 12/21/2013 15:44   Dg Fluoro Guide Cv Line-no Report  12/30/2013   CLINICAL DATA: left port-a-catheter for breast cancer   FLOURO GUIDE CV LINE  Fluoroscopy was utilized by the requesting physician.  No radiographic  interpretation.    US Breast Ltd Uni Right Inc Axilla  12/13/2013   CLINICAL DATA:  Status post left lumpectomy and radiation therapy for breast cancer in 2009.  EXAM: DIGITAL DIAGNOSTIC  BILATERAL MAMMOGRAM WITH CAD  ULTRASOUND RIGHT BREAST  COMPARISON:  Previous  examinations.  ACR Breast Density Category c: The breast tissue is heterogeneously dense, which may obscure small masses.  FINDINGS: Post lumpectomy changes are again demonstrated on the left with progressive calcification associated with fat necrosis.  On the right, there has been a diffuse increase in density throughout the central portion of the breast, centered in the upper-outer quadrant, measuring approximately 6 cm in maximum diameter. There are associated interval irregular, linear, branching calcifications within this area and extending more posteriorly. There is also a separate area of similar appearing calcifications in the posterior aspect of the breast, slightly medially and superiorly. There is also interval diffuse skin thickening in the anterior, medial and inferior portions of the right breast. There are also interval enlarged right axillary lymph nodes.  Mammographic images were processed with CAD.  On physical exam, the patient has an approximately 5 cm rounded, firm, fixed, palpable mass centered in the upper-outer quadrant of the right breast. There is mild skin redness in that area. There is also a faintly palpable right axillary lymph node.  Ultrasound is performed, showing a large, irregular, hypoechoic mass in the 11 o'clock of the right breast, 1 cm from the nipple, measuring approximately 4.1 x 3.3 x 2.8 cm in maximum dimensions. There is associated dense posterior acoustical shadowing.  More laterally in the right breast, in the 9 o'clock position, 6 cm from the nipple, there is an approximately 4.4 x 3.5 x 1.7 cm irregular hypoechoic area with milder posterior acoustical shadowing.  In the right axilla, multiple abnormal appearing right axillary lymph nodes are demonstrated with areas of marked focal cortical thickening measuring up to 9 mm in thickness.  IMPRESSION: 1. Interval changes of extensive, multifocal and multicentric malignancy involving a large portion of the right breast,  centered in the upper-outer quadrant. 2. Interval metastatic right axillary adenopathy. 3. Interval right breast skin thickening compatible with lymphatic obstruction and possible dermal extension of tumor.  RECOMMENDATION: Ultrasound-guided core needle biopsies of the masses seen in the 11 o'clock and 9 o'clock positions of the right breast ultrasound and ultrasound-guided core needle biopsy of the largest abnormal appearing right axillary lymph node. These have been scheduled for 9 a.m. on 12/26/2013.  I have discussed the findings and recommendations with the patient. Results were also provided in writing at the conclusion of the visit. If applicable, a reminder letter will be sent to the patient regarding the next appointment.  BI-RADS CATEGORY  5: Highly suggestive of malignancy.   Electronically Signed   By: Enrique Sack M.D.   On: 12/13/2013 14:20   [place] Rt Breast Bx W Loc Dev 1st Lesion Img Bx Spec US Guide  12/22/2013   ADDENDUM REPORT: 12/22/2013 12:04  ADDENDUM: Pathology results: Right breast at 11:00 - grade 3 invasive mammary carcinoma and mammary carcinoma in situ, ductal favored. Right breast at 9:00 - grade 3 invasive mammary carcinoma, ductal favored. Right axillary lymph node - metastatic carcinoma. These results  were found to be concordant by Dr. Andres Shad. Pathology was discussed with the patient and her daughter, [name], by telephone. The patient reported doing well after the biopsy. Post biopsy instructions were reviewed and their questions were answered. The patient has an appointment at the [facility] on December 28, 2013. She is scheduled for a bilateral breast MRI on December 26, 2013. The patient is encouraged to come to [facility] for educational materials. My number is provided to the patient and her daughter for future questions and concerns.  Pathology results reported by Susa Raring RN, BSN on December 22, 2013.   Electronically  Signed   By: Enrique Sack M.D.   On: 12/22/2013 12:04   12/22/2013   CLINICAL DATA:  4.1 cm 11 o'clock right breast mass with imaging features highly suspicious for malignancy.  EXAM: ULTRASOUND GUIDED RIGHT BREAST CORE NEEDLE BIOPSY  COMPARISON:  Previous exams.  FINDINGS: I met with the patient and we discussed the procedure of ultrasound-guided biopsy, including benefits and alternatives. We discussed the high likelihood of a successful procedure. We discussed the risks of the procedure, including infection, bleeding, tissue injury, clip migration, and inadequate sampling. Informed written consent was given. The usual time-out protocol was performed immediately prior to the procedure.  Using sterile technique and 2% Lidocaine as local anesthetic, under direct ultrasound visualization, a 12 gauge spring-loaded device was used to perform biopsy of the recently demonstrated 4.1 cm mass in the 11 o'clock position of the right breast using an inferior approach. At the conclusion of the procedure a ribbon shaped tissue marker clip was deployed into the biopsy cavity. Follow up 2 view mammogram was performed and dictated separately.  IMPRESSION: Ultrasound guided biopsy of a 4.1 cm 11 o'clock right breast mass. No apparent complications.  Electronically Signed: By: Enrique Sack M.D. 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 Right breast at 9:00 - grade 3 invasive mammary carcinoma, ductal favored. Right axillary lymph node - metastatic carcinoma. These results were found to be concordant by Dr. Andres Shad. Pathology was discussed with the patient and her daughter, [name], by telephone. The patient reported doing well after the biopsy. Post biopsy instructions were reviewed and their questions were  answered. The patient has an appointment at the [facility] on December 28, 2013. She is scheduled for a bilateral breast MRI on December 26, 2013. The patient is encouraged to come to [facility] for educational materials. My number is provided to the patient and her daughter for future questions and concerns.  Pathology results reported by Susa Raring RN, BSN on December 22, 2013.   Electronically Signed   By: Enrique Sack M.D.   On: 12/22/2013 12:05   12/22/2013   CLINICAL DATA:  Abnormal appearing right axillary lymph nodes at recent ultrasound with imaging features highly suspicious for metastatic lymph nodes.  EXAM: ULTRASOUND GUIDED RIGHT AXILLARY CORE NEEDLE BIOPSY  COMPARISON:  Previous exams.  FINDINGS: I met with the patient and we discussed the procedure of ultrasound-guided biopsy, including benefits and alternatives. We discussed the high likelihood of a successful procedure. We discussed the risks of the procedure, including infection, bleeding, tissue injury,  clip migration, and inadequate sampling. Informed written consent was given. The usual time-out protocol was performed immediately prior to the procedure.  Using sterile technique and 2% Lidocaine as local anesthetic, under direct ultrasound visualization, a 12 gauge spring-loaded device was used to perform biopsy of one of the recently demonstrated abnormal appearing right axillary lymph nodes using an inferior approach.  IMPRESSION: Ultrasound guided biopsy of an abnormal right axillary lymph node. No apparent complications.  Electronically Signed: By: Enrique Sack M.D. 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 Right breast at 9:00 - grade 3 invasive mammary carcinoma,  ductal favored. Right axillary lymph node - metastatic carcinoma. These results were found to be concordant by Dr. Andres Shad. Pathology was discussed with the patient and her daughter, [name], by telephone. The patient reported doing well after the biopsy. Post biopsy instructions were reviewed and their questions were answered. The patient has an appointment at the [facility] on December 28, 2013. She is scheduled for a bilateral breast MRI on December 26, 2013. The patient is encouraged to come to [facility] for educational materials. My number is provided to the patient and her daughter for future questions and concerns.  Pathology results reported by Susa Raring RN, BSN on December 22, 2013.   Electronically Signed   By: Enrique Sack M.D.   On: 12/22/2013 12:05   12/22/2013   CLINICAL DATA:  4.4 cm irregular hypoechoic area in the 9 o'clock position of the right breast at recent ultrasound, suspicious for malignancy.  EXAM: ULTRASOUND GUIDED RIGHT BREAST CORE NEEDLE BIOPSY  COMPARISON:  Previous exams.  PROCEDURE: I met with the patient and we discussed the procedure of ultrasound-guided biopsy, including benefits and alternatives. We discussed the high likelihood of a successful procedure. We discussed the risks of the procedure including infection, bleeding, tissue injury, clip migration, and inadequate sampling. Informed written consent was given. The usual time-out protocol was performed immediately prior to the procedure.  Using sterile technique and 2% Lidocaine as local anesthetic, under direct ultrasound visualization, a 12 gauge vacuum-assisteddevice was used to perform biopsy of the recently demonstrated 4.4 cm irregular hypoechoic area in the 9 o'clock position of the right breast using a inferomedial approach. At the conclusion of the procedure, a coil shaped tissue marker clip was deployed into the biopsy cavity. Follow-up 2-view mammogram was  performed and dictated separately.  IMPRESSION: Ultrasound-guided biopsy of a 4.4 cm irregular hypoechoic area in the 9 o'clock position of the right breast. No apparent complications.  Electronically Signed: By: Enrique Sack M.D. On: 12/21/2013 14:37    ASSESSMENT: 58 y.o. [place] woman status post right breast biopsy of 2 separate masses in the right axillary lymph node 12/21/2013 showing a clinical T3 N3, stage IIIC invasive ductal carcinoma, triple negative, with an MIB-1 of 85%  (1) staging studies 01/04/2014 show metastatic disease to the brain, lung, and bones. The patient has been referred to radiation oncology for stereotactic radiotherapy, planned for 01/13/2014  (2) neoadjuvant chemotherapy to follow radiation therapy, specific drugs to be determined  (3) breast surgery to be considered once a systemic control optimized  (4) also status post left lumpectomy and sentinel lymph node sampling 11/23/2007 for a  pTis pN0, stage 0 ductal carcinoma in situ, high-grade, estrogen and progesterone receptor negative, status post adjuvant radiation  PLAN: I spent approximately 30 minutes with the patient and her daughter, as well as the radiation oncology brain navigator, to discuss the new findings of Ev[place] is staging studies. She understands her brain MRI suggests metastatic disease to the brain, and her PET scan suggests involvement of the lung and bones as well as local regional spread. This is stage IV or metastatic disease, and unfortunately we do not know that her cure stage IV breast cancer.  Accordingly the goal of treatment will be control. We are starting the patient on dexamethasone--she had actually not started it yet and received her first dose in the office--in she will receive her stereotactic radio therapy 01/13/2014. No craniotomy is planned.  She will see me shortly thereafter and we will consider treatment of her systemic disease, possibly with eribulin.  The patient was  appropriately tearful and distraught, but was able to understand the overall situation. She agrees with the plan. She knows the treatment for control is different than for cure. She requested a note explaining that she will not be able to go back to work, and this was provided. She will call with any problems that may develop before her next visit here.  Diana Cruel, MD   01/05/2014 5:53 PM

## 2014-01-05 NOTE — Telephone Encounter (Signed)
This RN spoke with pt's dtr [name] ( she is patients " point of contact " ) per MD request.  Discussed need due to area of concern on MRI of brain to start steroid medication ASAP.  Tracey verbalized understanding including to stop by this office post PET scan for MD review of concerns.

## 2014-01-05 NOTE — Telephone Encounter (Addendum)
RECEIVED A CALL AND A FAXED REPORT. THIS REPORT WAS GIVEN TO DR.MAGRINAT.

## 2014-01-05 NOTE — Progress Notes (Signed)
Location of Breast Cancer: Right Breast  Histology per Pathology Report:  Invasive Mammary   Diagnosis 1. Breast, right, needle core biopsy, mass, 11 o'clock - INVASIVE MAMMARY CARCINOMA. - MAMMARY CARCINOMA IN SITU. - SEE COMMENT. 2. Breast, right, needle core biopsy, 9 o'clock - INVASIVE MAMMARY CARCINOMA. - SEE COMMENT. 3. Lymph node, needle/core biopsy, right - METASTATIC CARCINOM  Receptor Status: ER(0%), PR (0%), Her2-neu (No amplification), Ki-67(86%)  Did patient present with symptoms (if so, please note symptoms) or was this found on screening mammography?: Patient found masses in right breast ~ 1year.  Past/Anticipated interventions by surgeon, if any:Dr. Toth-Right Breast Needle Core Biopsy  Past/Anticipated interventions by medical oncology, if any: Chemotherapy - Dr. Jana Hakim - to start neoadjuvant chemotherapy 01/16/2014, consisting of doxorubicin and cyclophosphamide in dose dense fashion x4 with Neulasta support, to be followed by weekly paclitaxel and carboplatin x12   Lymphedema issues, if any: No  Pain issues, if any: C/o tenderness/pain at a level 5-6 on a scale of 0-10. Difficulty raising left arm  SAFETY ISSUES:  Prior radiation? Yes, to left breast in 2009 - completed September 2009  Pacemaker/ICD?No  Possible current pregnancy?No  Is the patient on methotrexate?No  Current Complaints / other details:  Presents with a rash diffuse rash on her right breast, stomach back, back of neck and left side, "bad since yesterday.  C/o increased drainage from right breast incision.  Decreased ROM of motion, especially left arm.     Deirdre Evener, RN 01/05/2014,4:15 PM

## 2014-01-06 ENCOUNTER — Encounter: Payer: Self-pay | Admitting: *Deleted

## 2014-01-06 ENCOUNTER — Other Ambulatory Visit: Payer: Self-pay

## 2014-01-06 ENCOUNTER — Telehealth (INDEPENDENT_AMBULATORY_CARE_PROVIDER_SITE_OTHER): Payer: Self-pay | Admitting: *Deleted

## 2014-01-06 ENCOUNTER — Ambulatory Visit
Admission: RE | Admit: 2014-01-06 | Discharge: 2014-01-06 | Disposition: A | Discharge status: Home / Self Care | Payer: No Typology Code available for payment source | Source: Ambulatory Visit | Attending: Radiation Oncology | Admitting: Radiation Oncology

## 2014-01-06 DIAGNOSIS — C7949 Secondary malignant neoplasm of other parts of nervous system: Principal | ICD-10-CM

## 2014-01-06 DIAGNOSIS — C7931 Secondary malignant neoplasm of brain: Secondary | ICD-10-CM

## 2014-01-06 MED ORDER — GADOBENATE DIMEGLUMINE 529 MG/ML IV SOLN
9.0000 mL | Freq: Once | INTRAVENOUS | Status: AC | PRN
  Administered 2014-01-06: 9 mL via INTRAVENOUS

## 2014-01-06 NOTE — Progress Notes (Signed)
I spoke with Dr.Moody regarding right breast drainage and rash over torso.He states that he changed her pain med from percocet to dilaudid.stated by patient that she has had history of rash and is waiting to be seen by a dermatologist for further assessment, awaiting medicaid approval.An in-basket message sent to Dr.Toth by Patric Dykes RN on 01/05/14 regarding assessment of right breast drainage from biopsy site.I did speak with Anderson Malta in triage at Dr.Toth's office regarding this and she states Dr. Marlou Starks will answer message.I did not see patient, note just indicated drainage.Anderson Malta will leave note in epic.This is not an emergent situation for today, so an appointment for next week will be fine.

## 2014-01-06 NOTE — Progress Notes (Signed)
[facility] Psychosocial Distress Screening Clinical Social Work  Clinical Social Work was referred by distress screening protocol.  The patient scored a 10 and 5 on the Psychosocial Distress Thermometer which indicates severe and moderate distress. Pt was seen on 12/28/13 in breast clinic by chaplain and was contacted via phone by Clinical Social Worker today to assess for distress and other psychosocial needs. Pt uses her daughter's phone as her contact number, as she does not have her own phone. Daughter to relay message and have pt contact CSW later this afternoon.   ONCBCN DISTRESS SCREENING 12/28/2013  Screening Type   Elta [place] the number that describes how much distress you have been experiencing in the past week 10  Practical problem type Work/school  Emotional problem type Adjusting to illness  Spiritual/Religous concerns type   Physical Problem type Pain  Physician notified of physical symptoms Yes  Referral to clinical psychology No  Referral to clinical social work Yes  Referral to dietition Yes  Referral to financial advocate Yes  Referral to support programs Yes  Referral to palliative care No   ONCBCN DISTRESS SCREENING 01/05/2014  Screening Type Initial Screening  Mark the number that describes how much distress you have been experiencing in the past week 5  Practical problem type Work/school  Emotional problem type Adjusting to illness;Boredom  Spiritual/Religous concerns type Relating to God  Physical Problem type Pain;Loss of appetitie;Skin dry/itchy;Swollen arms/legs  Physician notified of physical symptoms   Referral to clinical psychology   Referral to clinical social work   Referral to dietition   Referral to financial advocate   Referral to support programs   Referral to palliative care     Clinical Social Worker follow up needed: Yes.    If yes, follow up plan: CSW awaits return call.   Diana Racer, LCSW Clinical Social Worker [facility] [place] [facility] [place] Wednesday, Thursday and Friday Phone: [phone] Fax: [phone]

## 2014-01-06 NOTE — Telephone Encounter (Signed)
I also noticed the rash. It looks like a contact dermatitis from the tape placed after biopsy. If it is getting worse she should see dermatology.

## 2014-01-06 NOTE — Telephone Encounter (Signed)
[name] from the cancer center called.  She stated that one of her nurses left her a note to call the office regarding pt and some drainage from her incision site from her breast bx 12-21-13.  They was uncertain what type of drainage it was, but wanted [name] to be made aware.  [name] stated that [name] said it was not urgent.  [name] also advised that Diana Mood, [name] from there, sent [name] an in-basket message.  I advised I would send [name] this message as well just to be sure he received it.  [name]

## 2014-01-08 DIAGNOSIS — C7931 Secondary malignant neoplasm of brain: Secondary | ICD-10-CM | POA: Insufficient documentation

## 2014-01-08 DIAGNOSIS — C7949 Secondary malignant neoplasm of other parts of nervous system: Secondary | ICD-10-CM

## 2014-01-08 NOTE — Progress Notes (Signed)
Radiation Oncology         [phone]) [phone] ________________________________  Name: [name] MRN: [id number]  Date: 01/05/2014  DOB: 06/12/1956  Follow-Up Visit Note  CC: Lorayne Marek, MD  Magrinat, Virgie Dad, MD  Diagnosis:   Metastatic breast cancer with brain metastasis  Interval Since Last Radiation:  Not applicable   Narrative:  The patient returns today for evaluation for possible radiosurgery. The patient has been seen initially by our department by Dr. Sondra Come in multidisciplinary breast clinic. The patient initially had evidence of locally advanced, triple negative disease. The patient has undergone further imaging workup.  An MRI scan of the brain revealed a solitary 1.5 cm parenteral left-sided intracranial tumor. This was consistent with metastatic disease. The scan also suggested bony metastatic disease with the calvarium as well. The patient has also undergone a PET scan. This showed hypermetabolic activity corresponding to the patient's right breast mass and a hypermetabolic spiculated nodule was present within the right lung consistent with metastases. Hypermetabolic lymphadenopathy was seen in multiple locations and the patient also had diffuse bony metastatic disease as well.                              ALLERGIES:  is allergic to penicillins.  Meds: Current Outpatient Prescriptions  Medication Sig Dispense Refill  . albuterol (PROVENTIL HFA;VENTOLIN HFA) 108 (90 BASE) MCG/ACT inhaler Inhale 2 puffs into the lungs every 6 (six) hours as needed for wheezing or shortness of breath. For shortness of breath  3 Inhaler  3  . carbamide peroxide (DEBROX) 6.5 % otic solution Place 5 drops into both ears 2 (two) times daily.  15 mL  1  . Clobetasol Prop Emollient Base 0.05 % emollient cream Apply 1 application topically 2 (two) times daily. until symptoms resolve. Use scalp applicator  30 g  5  . fluticasone (FLOVENT HFA) 220 MCG/ACT inhaler Inhale 1 puff into the lungs 2 (two)  times daily.  3 Inhaler  3  . hydrOXYzine (ATARAX/VISTARIL) 25 MG tablet Take 1 tablet (25 mg total) by mouth every 8 (eight) hours as needed for itching.  60 tablet  2  . loratadine (CLARITIN) 10 MG tablet Take 1 tablet (10 mg total) by mouth daily.  30 tablet  10  . pantoprazole (PROTONIX) 40 MG tablet Take 1 tablet (40 mg total) by mouth daily.  30 tablet  2  . Vitamin D, Ergocalciferol, (DRISDOL) 50000 UNITS CAPS capsule Take 1 capsule (50,000 Units total) by mouth every 7 (seven) days.  12 capsule  0  . Aspirin-Acetaminophen-Caffeine (GOODYS EXTRA STRENGTH) [phone] MG PACK Take by mouth.      . dexamethasone (DECADRON) 4 MG tablet 2 tabs po bid TAKE FIRST DOSE ASAP  40 tablet  3  . HYDROmorphone (DILAUDID) 4 MG tablet Take 1 tablet (4 mg total) by mouth every 4 (four) hours as needed for severe pain.  60 tablet  0  . nicotine (NICODERM CQ) 21 mg/24hr patch Place 1 patch (21 mg total) onto the skin daily.  28 patch  0  . oxyCODONE-acetaminophen (ROXICET) 5-325 MG per tablet Take 1-2 tablets by mouth every 4 (four) hours as needed.  50 tablet  0   No current facility-administered medications for this encounter.    Physical Findings: The patient is in no acute distress. Patient is alert and oriented.  height is _0  (1.499 m) and weight is 108 lb (48.988 kg). Her temperature  is 98 F (36.7 C). Her blood pressure is 142/65 and her pulse is 66. Marland Kitchen   General: Well-developed, in no acute distress HEENT: Normocephalic, atraumatic Cardiovascular: Regular rate and rhythm Respiratory: Clear to auscultation bilaterally Breasts:   Full breast exam deferred. The patient does have some clear drainage from the biopsy sites within the right breast. Currently these areas are bandaged. GI: Soft, nontender, normal bowel sounds Extremities: No edema present   Lab Findings: Lab Results  Component Value Date   WBC 8.0 12/28/2013   HGB 14.0 12/28/2013   HCT 43.1 12/28/2013   MCV 98.1 12/28/2013   PLT  224 12/28/2013     Radiographic Findings: Mr Jeri Cos ZW Contrast  01/06/2014   CLINICAL DATA:  SRS targeting.  Metastatic breast cancer.  EXAM: MRI HEAD WITHOUT AND WITH CONTRAST  TECHNIQUE: Multiplanar, multiecho pulse sequences of the brain and surrounding structures were obtained without and with intravenous contrast.  CONTRAST:  75m MULTIHANCE GADOBENATE DIMEGLUMINE 529 MG/ML IV SOLN  COMPARISON:  01/04/2014 routine brain MRI  FINDINGS: The bone marrow of the calvarium and the visualized upper cervical spine is diffusely heterogeneous with innumerable enhancing lesions present, compatible with diffuse osseous metastases. There is no acute infarct. There is mild cerebral atrophy. Foci of T2 hyperintensity in the subcortical and deep cerebral white matter and pons are nonspecific but compatible with mild chronic small vessel ischemic disease. There is no midline shift or extra-axial fluid collection.  Heterogeneously enhancing left parietal mass posterior to the operculum with small amount of associated blood products measures 1.7 x 1.3 x 1.5 cm (series 10, image 107) with a small amount of vasogenic edema. No other enhancing brain lesions are seen.  Subcentimeter cystic focus in the left lacrimal gland is again noted, indeterminate. Opacification of the left sphenoid sinus is unchanged. Major intracranial vascular flow voids appear grossly preserved, with the distal right vertebral artery again appearing hypoplastic.  IMPRESSION: 1. 1.7 cm left parietal mass, consistent with metastasis. No other brain metastases identified. 2. Diffuse osseous metastases.   Electronically Signed   By: ALogan Bores  On: 01/06/2014 17:28   Mr Brain W Wo Contrast  01/05/2014   CLINICAL DATA:  History of breast cancer.  Headaches.  EXAM: MRI HEAD WITHOUT AND WITH CONTRAST  TECHNIQUE: Multiplanar, multiecho pulse sequences of the brain and surrounding structures were obtained without and with intravenous contrast.  CONTRAST:   984mMULTIHANCE GADOBENATE DIMEGLUMINE 529 MG/ML IV SOLN  COMPARISON:  07/25/2008 head CT.  No comparison MR.  FINDINGS: Per MR technologist, patient became nauseous and vomited after receiving contrast.  1.5 x 1.3 x 1.4 cm enhancing partially hemorrhagic lesion posterior left operculum region at the inferior border of the left frontal -parietal junction with mild surrounding vasogenic edema. Although this is peripherally located, this appears to be intra-axial rather than extra-axial (and therefore felt not to represent a meningioma). Given the patient's history, this is suspicious for intracranial metastatic disease with primary brain tumor a secondary less likely consideration.  Heterogeneous appearance of the calvarium and bone marrow of the visualized aspects of the cervical spine. Although it is possible patient's habitus, osteoporosis and/or anemia partially contribute to this appearance findings are suspicious for a osseous metastatic disease with most notable metastatic lesion occipital aspect of the calvarium.  Small vessel disease type changes subcortical and periventricular region as well as involving the pons.  No acute infarct.  No intracranial hemorrhage separate from the above described lesion.  Opacification left  sphenoid sinus with surrounding bony thickening similar to the prior CT suggesting chronic sinusitis type changes with inspissation of centrally contain material. Fungal disease can cause a similar appearance on MR but felt to be on likely given the chronicity findings.  Small lobulated cystic appearing aspect of the left lacrimal gland of questionable etiology.  No hydrocephalus.  Right vertebral artery appears small and may terminate in a posterior inferior cerebellar artery distribution. Small basilar artery. Internal carotid arteries are patent.  IMPRESSION: 1.5 x 1.3 x 1.4 cm enhancing partially hemorrhagic lesion posterior left operculum region at the inferior border of the left  frontal -parietal junction with mild surrounding vasogenic edema. Given the patient's history, this is suspicious for intracranial metastatic disease.  Findings are suspicious for a osseous metastatic disease with most notable metastatic lesion occipital aspect of the calvarium.  Small vessel disease type changes.  No acute infarct.  Opacification left sphenoid sinus with surrounding bony thickening similar to the prior CT suggesting chronic sinusitis type changes with inspissation of centrally contain material.  Small lobulated cystic appearing aspect of the left lacrimal gland of questionable etiology.  Per MR technologist, patient became nauseous and vomited after receiving contrast.  Please see above for further detail.  These results will be called to the ordering clinician or representative by the Radiologist Assistant, and communication documented in the PACS or zVision Dashboard.   Electronically Signed   By: Chauncey Cruel M.D.   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 Status post left lumpectomy and radiation therapy for breast cancer in 2009.  LABS:  None obtained today.  EXAM: BILATERAL BREAST MRI WITH AND WITHOUT CONTRAST  TECHNIQUE: Multiplanar, multisequence MR images of both breasts were obtained prior to and following the intravenous administration of 16m of MultiHance.  THREE-DIMENSIONAL MR IMAGE RENDERING ON INDEPENDENT WORKSTATION:  Three-dimensional MR images were rendered by post-processing of the original MR data on an independent workstation. The three-dimensional MR images were interpreted, and findings are reported in the following complete MRI report for this study. Three dimensional images were evaluated at the independent DynaCad workstation  COMPARISON:  Recent mammogram, ultrasound and biopsy examinations.  FINDINGS:  Breast composition: c:  Heterogeneous fibroglandular tissue  Background parenchymal enhancement: Minimal  Right breast: Large, irregular enhancing mass in the central right breast, involving all four quadrants, centered superiorly. This extends to the posterior aspect of the nipple without evidence of extension into the nipple. This is not involving the pectoralis muscle. This measures 7.2 x 4.5 x 3.7 cm in maximum dimensions. This contains biopsy marker clip artifacts posteriorly and laterally. There is also diffuse right breast skin thickening throughout the upper half of the breast with associated abnormal skin enhancement.  Left breast: Post lumpectomy changes with no mass or enhancement suspicious for malignancy.  Lymph nodes: Multiple abnormal appearing right axillary lymph nodes with loss of the normal fatty hila. These include level 1 nodes and a 10 mm short axis node extending posterior to the lateral aspect of the pectoralis minor muscle on inversion recovery image number 3. There is also an abnormal appearing right internal mammary lymph node with a short axis diameter of 5 mm on image number 59 of series 13.  Ancillary findings:  None.  IMPRESSION: 1. 7.2 x 4.5 x 3.7 cm biopsy-proven invasive ductal carcinoma in in the central  right breast, involving all 4 quadrants, centered superiorly. 2. Diffuse skin thickening and enhancement in the superior half of the right breast, suspicious for dermal invasion of malignancy. 3. Level 1 and level 2 metastatic right axillary adenopathy. 4. Metastatic right internal mammary adenopathy. 5. Post lumpectomy changes on the left without evidence of recurrent malignancy in the left breast.  RECOMMENDATION: Treatment plan.  BI-RADS CATEGORY  6: Known biopsy-proven malignancy.   Electronically Signed   By: Enrique Sack M.D.   On: 12/26/2013 15:33   Nm Pet Image Initial (pi) Skull Base To Thigh  01/05/2014   CLINICAL DATA:  Initial treatment strategy for right breast  carcinoma. Personal history of left breast carcinoma.  EXAM: NUCLEAR MEDICINE PET SKULL BASE TO THIGH  TECHNIQUE: 6.3 mCi F-18 FDG was injected intravenously. Full-ring PET imaging was performed from the skull base to thigh after the radiotracer. CT data was obtained and used for attenuation correction and anatomic localization.  FASTING BLOOD GLUCOSE:  Value: 104 mg/dl  COMPARISON:  12/20/2008  FINDINGS: NECK  No hypermetabolic lymph nodes in the neck.  CHEST  4 cm mass in the right breast shows hypermetabolic activity, with SUV max of 4.8. There is also diffuse hypermetabolic skin thickening involving the right breast, consistent with inflammatory breast carcinoma.  Mild hypermetabolic lymphadenopathy is seen within the right axilla, right hilum, and mediastinum in the right paratracheal region. SUV max seen in the right paratracheal region measures 8.2. No hypermetabolic lymphadenopathy seen along the internal mammary chains or left hilum.  A spiculated nodule is seen in the medial left upper lobe which measures 2.7 cm, and is having maximum SUV of 9.0. The morphology favors a primary bronchogenic carcinoma over pulmonary metastases. No other hypermetabolic pulmonary nodules are identified.  ABDOMEN/PELVIS  No abnormal hypermetabolic activity within the liver, pancreas, adrenal glands, or spleen. No hypermetabolic lymph nodes in the abdomen or pelvis.  SKELETON  Diffuse hypermetabolic bone metastases are seen throughout the skeleton.  IMPRESSION: Hypermetabolic 4 cm right breast mass and overlying skin thickening, consistent with inflammatory breast carcinoma.  2.7 cm hypermetabolic spiculated nodule in the right upper lobe, with morphology favoring a primary breast carcinoma over pulmonary metastases.  Hypermetabolic lymphadenopathy in the right axilla, right hilum, and right paratracheal region, consistent with metastatic disease.  Diffuse bone metastases.   Electronically Signed   By: Earle Gell M.D.   On:  01/05/2014 16:10   Dg Chest Port 1 View  12/30/2013   CLINICAL DATA:  Port-A-Cath placement.  EXAM: PORTABLE CHEST - 1 VIEW  COMPARISON:  10/14/2010  FINDINGS: Left subclavian Port-A-Cath has been placed, with tip overlying the lower SVC. Cardiomediastinal silhouette is within normal limits. Thoracic aortic calcification is noted. The lungs are mildly hyperinflated without evidence confluent airspace opacity, pulmonary edema, pleural effusion, or pneumothorax. No acute osseous abnormality is identified.  IMPRESSION: Left Port-A-Cath as above.   Electronically Signed   By: Logan Bores   On: 12/30/2013 15:00   Mm Digital Diagnostic Bilat  12/13/2013   CLINICAL DATA:  Status post left lumpectomy and radiation therapy for breast cancer in 2009.  EXAM: DIGITAL DIAGNOSTIC  BILATERAL MAMMOGRAM WITH CAD  ULTRASOUND RIGHT BREAST  COMPARISON:  Previous examinations.  ACR Breast Density Category c: The breast tissue is heterogeneously dense, which may obscure small masses.  FINDINGS: Post lumpectomy changes are again demonstrated on the left with progressive calcification associated with fat necrosis.  On the right, there has been a diffuse increase in density throughout  the central portion of the breast, centered in the upper-outer quadrant, measuring approximately 6 cm in maximum diameter. There are associated interval irregular, linear, branching calcifications within this area and extending more posteriorly. There is also a separate area of similar appearing calcifications in the posterior aspect of the breast, slightly medially and superiorly. There is also interval diffuse skin thickening in the anterior, medial and inferior portions of the right breast. There are also interval enlarged right axillary lymph nodes.  Mammographic images were processed with CAD.  On physical exam, the patient has an approximately 5 cm rounded, firm, fixed, palpable mass centered in the upper-outer quadrant of the right breast. There  is mild skin redness in that area. There is also a faintly palpable right axillary lymph node.  Ultrasound is performed, showing a large, irregular, hypoechoic mass in the 11 o'clock of the right breast, 1 cm from the nipple, measuring approximately 4.1 x 3.3 x 2.8 cm in maximum dimensions. There is associated dense posterior acoustical shadowing.  More laterally in the right breast, in the 9 o'clock position, 6 cm from the nipple, there is an approximately 4.4 x 3.5 x 1.7 cm irregular hypoechoic area with milder posterior acoustical shadowing.  In the right axilla, multiple abnormal appearing right axillary lymph nodes are demonstrated with areas of marked focal cortical thickening measuring up to 9 mm in thickness.  IMPRESSION: 1. Interval changes of extensive, multifocal and multicentric malignancy involving a large portion of the right breast, centered in the upper-outer quadrant. 2. Interval metastatic right axillary adenopathy. 3. Interval right breast skin thickening compatible with lymphatic obstruction and possible dermal extension of tumor.  RECOMMENDATION: Ultrasound-guided core needle biopsies of the masses seen in the 11 o'clock and 9 o'clock positions of the right breast ultrasound and ultrasound-guided core needle biopsy of the largest abnormal appearing right axillary lymph node. These have been scheduled for 9 a.m. on 12/26/2013.  I have discussed the findings and recommendations with the patient. Results were also provided in writing at the conclusion of the visit. If applicable, a reminder letter will be sent to the patient regarding the next appointment.  BI-RADS CATEGORY  5: Highly suggestive of malignancy.   Electronically Signed   By: Enrique Sack M.D.   On: 12/13/2013 14:20   Mm Digital Diagnostic Unilat R  12/21/2013   CLINICAL DATA:  Status post right breast ultrasound-guided core needle biopsies.  EXAM: DIAGNOSTIC RIGHT MAMMOGRAM POST ULTRASOUND BIOPSY  COMPARISON:  Previous exams   FINDINGS: Mammographic images were obtained following ultrasound guided biopsy of a 4.1 cm mass in the 11 o'clock position of the right breast and a 4.4 cm irregular hypoechoic area in the 9 o'clock position of the right breast. These demonstrate a ribbon shaped biopsy marker at the inferior aspect of the irregular microcalcifications seen within an area of increased density in the upper outer right breast at recent mammography, corresponding to the biopsied 4.1 cm mass in the 11 o'clock position of the breast. A wing shaped clip is located 2 cm more laterally in the breast, corresponding to the biopsied 4.4 cm irregular hypoechoic area in the 9 o'clock position of the breast. Diffuse breast skin thickening and edema is again demonstrated.  IMPRESSION: Appropriate deployment of ribbon shaped and wing shaped biopsy marker clips, as described above.  Final Assessment: Post Procedure Mammograms for Marker Placement   Electronically Signed   By: Enrique Sack M.D.   On: 12/21/2013 15:44   Dg Fluoro Guide Cv Line-no  Report  12/30/2013   CLINICAL DATA: left port-a-catheter for breast cancer   FLOURO GUIDE CV LINE  Fluoroscopy was utilized by the requesting physician.  No radiographic  interpretation.    US Breast Ltd Uni Right Inc Axilla  12/13/2013   CLINICAL DATA:  Status post left lumpectomy and radiation therapy for breast cancer in 2009.  EXAM: DIGITAL DIAGNOSTIC  BILATERAL MAMMOGRAM WITH CAD  ULTRASOUND RIGHT BREAST  COMPARISON:  Previous examinations.  ACR Breast Density Category c: The breast tissue is heterogeneously dense, which may obscure small masses.  FINDINGS: Post lumpectomy changes are again demonstrated on the left with progressive calcification associated with fat necrosis.  On the right, there has been a diffuse increase in density throughout the central portion of the breast, centered in the upper-outer quadrant, measuring approximately 6 cm in maximum diameter. There are associated interval  irregular, linear, branching calcifications within this area and extending more posteriorly. There is also a separate area of similar appearing calcifications in the posterior aspect of the breast, slightly medially and superiorly. There is also interval diffuse skin thickening in the anterior, medial and inferior portions of the right breast. There are also interval enlarged right axillary lymph nodes.  Mammographic images were processed with CAD.  On physical exam, the patient has an approximately 5 cm rounded, firm, fixed, palpable mass centered in the upper-outer quadrant of the right breast. There is mild skin redness in that area. There is also a faintly palpable right axillary lymph node.  Ultrasound is performed, showing a large, irregular, hypoechoic mass in the 11 o'clock of the right breast, 1 cm from the nipple, measuring approximately 4.1 x 3.3 x 2.8 cm in maximum dimensions. There is associated dense posterior acoustical shadowing.  More laterally in the right breast, in the 9 o'clock position, 6 cm from the nipple, there is an approximately 4.4 x 3.5 x 1.7 cm irregular hypoechoic area with milder posterior acoustical shadowing.  In the right axilla, multiple abnormal appearing right axillary lymph nodes are demonstrated with areas of marked focal cortical thickening measuring up to 9 mm in thickness.  IMPRESSION: 1. Interval changes of extensive, multifocal and multicentric malignancy involving a large portion of the right breast, centered in the upper-outer quadrant. 2. Interval metastatic right axillary adenopathy. 3. Interval right breast skin thickening compatible with lymphatic obstruction and possible dermal extension of tumor.  RECOMMENDATION: Ultrasound-guided core needle biopsies of the masses seen in the 11 o'clock and 9 o'clock positions of the right breast ultrasound and ultrasound-guided core needle biopsy of the largest abnormal appearing right axillary lymph node. These have been  scheduled for 9 a.m. on 12/26/2013.  I have discussed the findings and recommendations with the patient. Results were also provided in writing at the conclusion of the visit. If applicable, a reminder letter will be sent to the patient regarding the next appointment.  BI-RADS CATEGORY  5: Highly suggestive of malignancy.   Electronically Signed   By: Enrique Sack M.D.   On: 12/13/2013 14:20   [place] Rt Breast Bx W Loc Dev 1st Lesion Img Bx Spec US Guide  12/22/2013   ADDENDUM REPORT: 12/22/2013 12:04  ADDENDUM: Pathology results: Right breast at 11:00 - grade 3 invasive mammary carcinoma and mammary carcinoma in situ, ductal favored. Right breast at 9:00 - grade 3 invasive mammary carcinoma, ductal favored. Right axillary lymph node - metastatic carcinoma. These results were found to be concordant by Dr. Andres Shad. Pathology was discussed with the patient and  her daughter, [name], by telephone. The patient reported doing well after the biopsy. Post biopsy instructions were reviewed and their questions were answered. The patient has an appointment at the [facility] on December 28, 2013. She is scheduled for a bilateral breast MRI on December 26, 2013. The patient is encouraged to come to [facility] for educational materials. My number is provided to the patient and her daughter for future questions and concerns.  Pathology results reported by Susa Raring RN, BSN on December 22, 2013.   Electronically Signed   By: Enrique Sack M.D.   On: 12/22/2013 12:04   12/22/2013   CLINICAL DATA:  4.1 cm 11 o'clock right breast mass with imaging features highly suspicious for malignancy.  EXAM: ULTRASOUND GUIDED RIGHT BREAST CORE NEEDLE BIOPSY  COMPARISON:  Previous exams.  FINDINGS: I met with the patient and we discussed the procedure of ultrasound-guided biopsy, including benefits and alternatives. We discussed the high likelihood of a successful procedure. We discussed the  risks of the procedure, including infection, bleeding, tissue injury, clip migration, and inadequate sampling. Informed written consent was given. The usual time-out protocol was performed immediately prior to the procedure.  Using sterile technique and 2% Lidocaine as local anesthetic, under direct ultrasound visualization, a 12 gauge spring-loaded device was used to perform biopsy of the recently demonstrated 4.1 cm mass in the 11 o'clock position of the right breast using an inferior approach. At the conclusion of the procedure a ribbon shaped tissue marker clip was deployed into the biopsy cavity. Follow up 2 view mammogram was performed and dictated separately.  IMPRESSION: Ultrasound guided biopsy of a 4.1 cm 11 o'clock right breast mass. No apparent complications.  Electronically Signed: By: Enrique Sack M.D. 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 Right breast at 9:00 - grade 3 invasive mammary carcinoma, ductal favored. Right axillary lymph node - metastatic carcinoma. These results were found to be concordant by Dr. Andres Shad. Pathology was discussed with the patient and her daughter, [name], by telephone. The patient reported doing well after the biopsy. Post biopsy instructions were reviewed and their questions were answered. The patient has an appointment at the [facility] on December 28, 2013. She is scheduled for a bilateral breast MRI on December 26, 2013. The patient is encouraged to come to [facility] for educational materials. My number is provided to the patient and her daughter for future questions and concerns.  Pathology results reported by Susa Raring RN, BSN on December 22, 2013.   Electronically Signed   By:  Enrique Sack M.D.   On: 12/22/2013 12:05   12/22/2013   CLINICAL DATA:  Abnormal appearing right axillary lymph nodes at recent ultrasound with imaging features highly suspicious for metastatic lymph nodes.  EXAM: ULTRASOUND GUIDED RIGHT AXILLARY CORE NEEDLE BIOPSY  COMPARISON:  Previous exams.  FINDINGS: I met with the patient and we discussed the procedure of ultrasound-guided biopsy, including benefits and alternatives. We discussed the high likelihood of a successful procedure. We discussed the risks of the procedure, including infection, bleeding, tissue injury, clip migration, and inadequate sampling. Informed written consent was given. The usual time-out protocol was performed  immediately prior to the procedure.  Using sterile technique and 2% Lidocaine as local anesthetic, under direct ultrasound visualization, a 12 gauge spring-loaded device was used to perform biopsy of one of the recently demonstrated abnormal appearing right axillary lymph nodes using an inferior approach.  IMPRESSION: Ultrasound guided biopsy of an abnormal right axillary lymph node. No apparent complications.  Electronically Signed: By: Enrique Sack M.D. 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 Right breast at 9:00 - grade 3 invasive mammary carcinoma, ductal favored. Right axillary lymph node - metastatic carcinoma. These results were found to be concordant by Dr. Andres Shad. Pathology was discussed with the patient and her daughter, [name], by telephone. The patient reported doing well after the biopsy. Post biopsy instructions were reviewed and their questions were answered. The patient has an appointment at the [facility] on December 28, 2013. She is scheduled for a bilateral  breast MRI on December 26, 2013. The patient is encouraged to come to [facility] for educational materials. My number is provided to the patient and her daughter for future questions and concerns.  Pathology results reported by Susa Raring RN, BSN on December 22, 2013.   Electronically Signed   By: Enrique Sack M.D.   On: 12/22/2013 12:05   12/22/2013   CLINICAL DATA:  4.4 cm irregular hypoechoic area in the 9 o'clock position of the right breast at recent ultrasound, suspicious for malignancy.  EXAM: ULTRASOUND GUIDED RIGHT BREAST CORE NEEDLE BIOPSY  COMPARISON:  Previous exams.  PROCEDURE: I met with the patient and we discussed the procedure of ultrasound-guided biopsy, including benefits and alternatives. We discussed the high likelihood of a successful procedure. We discussed the risks of the procedure including infection, bleeding, tissue injury, clip migration, and inadequate sampling. Informed written consent was given. The usual time-out protocol was performed immediately prior to the procedure.  Using sterile technique and 2% Lidocaine as local anesthetic, under direct ultrasound visualization, a 12 gauge vacuum-assisteddevice was used to perform biopsy of the recently demonstrated 4.4 cm irregular hypoechoic area in the 9 o'clock position of the right breast using a inferomedial approach. At the conclusion of the procedure, a coil shaped tissue marker clip was deployed into the biopsy cavity. Follow-up 2-view mammogram was performed and dictated separately.  IMPRESSION: Ultrasound-guided biopsy of a 4.4 cm irregular hypoechoic area in the 9 o'clock position of the right breast. No apparent complications.  Electronically Signed: By: Enrique Sack M.D. On: 12/21/2013 14:37    Impression:    The patient unfortunately now has evidence for metastatic disease including a solitary left intracranial metastasis. This measured 1.5 cm and is an appropriate lesion for radiosurgery. The patient  saw Dr. Jana Hakim today in clinic who has discussed the results of her recent imaging and the implications of her metastatic disease.   I discussed with the patient the role and rationale for a course of radiosurgery which I would recommend. We discussed the expected benefit of such a treatment as well as the possible side effects and risks of treatment as well. The patient is still coming to grips with her new diagnosis. All of her questions were answered.    Plan:  The patient will undergo  a SRS-protocol MRI scan of the brain for treatment planning purposes. She will also undergo simulation in the near future. The patient will be evaluated by neurosurgery and I anticipate proceeding with her single fraction of radiosurgery to the solitary metastasis in the near future.   Jodelle Gross, M.D., Ph.D.

## 2014-01-09 ENCOUNTER — Ambulatory Visit
Admission: RE | Admit: 2014-01-09 | Discharge: 2014-01-09 | Disposition: A | Discharge status: Home / Self Care | Payer: Medicaid Other | Source: Ambulatory Visit | Attending: Radiation Oncology | Admitting: Radiation Oncology

## 2014-01-09 VITALS — BP 135/64 | HR 55 | Temp 98.7°F | Resp 22 | Ht 59.0 in | Wt 106.9 lb

## 2014-01-09 DIAGNOSIS — C7949 Secondary malignant neoplasm of other parts of nervous system: Principal | ICD-10-CM

## 2014-01-09 DIAGNOSIS — C7931 Secondary malignant neoplasm of brain: Secondary | ICD-10-CM

## 2014-01-09 DIAGNOSIS — Z510 Encounter for antineoplastic radiation therapy: Secondary | ICD-10-CM | POA: Diagnosis not present

## 2014-01-09 MED ORDER — SODIUM CHLORIDE 0.9 % IJ SOLN
10.0000 mL | Freq: Once | INTRAMUSCULAR | Status: AC
  Administered 2014-01-09: 10 mL via INTRAVENOUS

## 2014-01-09 MED ORDER — HEPARIN SOD (PORK) LOCK FLUSH 100 UNIT/ML IV SOLN
500.0000 [IU] | Freq: Once | INTRAVENOUS | Status: AC
Start: 2014-01-09 — End: 2014-01-09
  Administered 2014-01-09: 500 [IU] via INTRAVENOUS

## 2014-01-09 NOTE — Addendum Note (Signed)
Encounter addended by: Rebecca Eaton, RN on: 01/09/2014  4:17 PM<BR>     Documentation filed: Notes Section, Inpatient Document Flowsheet

## 2014-01-09 NOTE — Progress Notes (Signed)
Patient labs 12/28/13: BUN=6.1, CR=0.8,not diabetic, no allergy to IV dye, patient here for IV start, emla cream to apply to left porta cath left subclavian site , name and date of birth given as identification,will let emla cream stay on for 20 minutes,, patient is her for IV start for Ct simulation.patient has a power port, , emla cream applied to site, had head ache this am took dilaudid, pain resolved, blurry vision states needs new glasses, no nausea, no dizzyness, Removed emla cream from left site, cleanses area with chlorosept per protocol, using sterile technique, accessed left power port x 1 attempt excellent blood return,,applied op site over left power port,  no c/o pain, flushed port with 53ml Normal saline, patient tolerated well, daughter in with patient, informed Mont Dutton, brain navigator patient is ready, ,patient escorted to [facility] room 3:18 PM

## 2014-01-09 NOTE — Progress Notes (Signed)
de accessed port acath after flushed with 28ml normal saline, and 30ml heparin per protocol, had excellent blood return, with flushing, banadaid applied over site, patient tolerated ct simulation with mask on 4:02 PM

## 2014-01-09 NOTE — Progress Notes (Signed)
Patient helped up by RT therapist and patient then c/o pain to right rib side when sitting up from ct sim table, patient brought to nursing by Lyndee Leo, vitals taken, patient c/o sharp pain, , sobm, 100% room air, vitals 97.4, 135/64,pulse=55,rr=22, , patient stated pain was easing off after sitting a few minutes but then c/o again sharp pain in right rib chest area, when getting clothes on, notified MD 4:16 PM

## 2014-01-10 DIAGNOSIS — Z510 Encounter for antineoplastic radiation therapy: Secondary | ICD-10-CM | POA: Diagnosis not present

## 2014-01-11 ENCOUNTER — Other Ambulatory Visit: Payer: Self-pay

## 2014-01-11 DIAGNOSIS — Z510 Encounter for antineoplastic radiation therapy: Secondary | ICD-10-CM | POA: Diagnosis not present

## 2014-01-13 ENCOUNTER — Ambulatory Visit (HOSPITAL_BASED_OUTPATIENT_CLINIC_OR_DEPARTMENT_OTHER): Payer: Medicaid Other | Admitting: Oncology

## 2014-01-13 ENCOUNTER — Ambulatory Visit
Admission: RE | Admit: 2014-01-13 | Discharge: 2014-01-13 | Disposition: A | Discharge status: Home / Self Care | Payer: Medicaid Other | Source: Ambulatory Visit | Attending: Radiation Oncology | Admitting: Radiation Oncology

## 2014-01-13 VITALS — BP 172/73 | HR 57 | Temp 98.0°F | Resp 18 | Ht 59.0 in | Wt 108.6 lb

## 2014-01-13 VITALS — BP 164/64 | HR 59 | Temp 98.8°F | Resp 20

## 2014-01-13 DIAGNOSIS — C7952 Secondary malignant neoplasm of bone marrow: Secondary | ICD-10-CM

## 2014-01-13 DIAGNOSIS — C7949 Secondary malignant neoplasm of other parts of nervous system: Principal | ICD-10-CM

## 2014-01-13 DIAGNOSIS — C7931 Secondary malignant neoplasm of brain: Secondary | ICD-10-CM

## 2014-01-13 DIAGNOSIS — C7951 Secondary malignant neoplasm of bone: Secondary | ICD-10-CM

## 2014-01-13 DIAGNOSIS — Z510 Encounter for antineoplastic radiation therapy: Secondary | ICD-10-CM | POA: Diagnosis not present

## 2014-01-13 DIAGNOSIS — J449 Chronic obstructive pulmonary disease, unspecified: Secondary | ICD-10-CM

## 2014-01-13 DIAGNOSIS — R001 Bradycardia, unspecified: Secondary | ICD-10-CM

## 2014-01-13 DIAGNOSIS — C50411 Malignant neoplasm of upper-outer quadrant of right female breast: Secondary | ICD-10-CM

## 2014-01-13 DIAGNOSIS — C50919 Malignant neoplasm of unspecified site of unspecified female breast: Secondary | ICD-10-CM

## 2014-01-13 DIAGNOSIS — F411 Generalized anxiety disorder: Secondary | ICD-10-CM

## 2014-01-13 DIAGNOSIS — J42 Unspecified chronic bronchitis: Secondary | ICD-10-CM

## 2014-01-13 DIAGNOSIS — C50911 Malignant neoplasm of unspecified site of right female breast: Secondary | ICD-10-CM

## 2014-01-13 DIAGNOSIS — C7800 Secondary malignant neoplasm of unspecified lung: Secondary | ICD-10-CM

## 2014-01-13 DIAGNOSIS — Z923 Personal history of irradiation: Secondary | ICD-10-CM

## 2014-01-13 HISTORY — DX: Personal history of irradiation: Z92.3

## 2014-01-13 MED ORDER — LORAZEPAM 0.5 MG PO TABS: 0.5000 mg | ORAL_TABLET | Freq: Three times a day (TID) | ORAL | Status: DC

## 2014-01-13 MED ORDER — OXYCODONE-ACETAMINOPHEN 5-325 MG PO TABS: 1.0000 | ORAL_TABLET | ORAL | Status: DC | PRN

## 2014-01-13 NOTE — Progress Notes (Signed)
  Radiation Oncology         [phone] ________________________________  Name: Bo Rogue MRN: [id number]  Date: 01/13/2014  DOB: 09-12-1955   SPECIAL TREATMENT PROCEDURE   3D TREATMENT PLANNING AND DOSIMETRY: The patient's radiation plan was reviewed and approved by Dr. Sherwood Gambler from neurosurgery and radiation oncology prior to treatment. It showed 3-dimensional radiation distributions overlaid onto the planning CT/MRI image set. The [facility] for the target structures as well as the organs at risk were reviewed. The documentation of the 3D plan and dosimetry are filed in the radiation oncology EMR.   NARRATIVE: The patient was brought to the TrueBeam stereotactic radiation treatment machine and placed supine on the CT couch. The head frame was applied, and the patient was set up for stereotactic radiosurgery. Neurosurgery was present for the set-up and delivery   SIMULATION VERIFICATION: In the couch zero-angle position, the patient underwent Exactrac imaging using the Brainlab system with orthogonal KV images. These were carefully aligned and repeated to confirm treatment position for each of the isocenters. The Exactrac snap film verification was repeated at each couch angle.   SPECIAL TREATMENT PROCEDURE: The patient received stereotactic radiosurgery to the following targets:  Left parietal 26mm target was treated using 3 Arcs to a prescription dose of 20 Gy. ExacTrac Snap verification was performed for each couch angle.   STEREOTACTIC TREATMENT MANAGEMENT: Following delivery, the patient was transported to nursing in stable condition and monitored for possible acute effects. Vital signs were recorded . The patient tolerated treatment without significant acute effects, and was discharged to home in stable condition.  PLAN: Follow-up in one month.   ------------------------------------------------  Jodelle Gross, MD, PhD

## 2014-01-13 NOTE — Progress Notes (Signed)
Pt alert & oriented x 3 with fluent speech, gait normal, Pt reports visual blurring, double vision. Pt presenting appropriate quality, quantity and organization of sentences. Decadron? Yes, 4mg - 2 tabs, bid.  Dr Lisbeth Renshaw is to see pt to discuss tapering dose.

## 2014-01-13 NOTE — Op Note (Signed)
Name: [name]    MRN: [id number]   Date: 01/13/2014    DOB: 07-08-1955   STEREOTACTIC RADIOSURGERY OPERATIVE NOTE  PRE-OPERATIVE DIAGNOSIS:  Metastatic breast CA  POST-OPERATIVE DIAGNOSIS:  Same  PROCEDURE:  Stereotactic Radiosurgery  SURGEON:  Consuella Lose, MD  RADIATION ONCOLOGIST: Dr. Kyung Rudd, MD  TECHNIQUE:  The patient underwent a radiation treatment planning session in the radiation oncology simulation suite under the care of the radiation oncology physician and physicist.  I participated closely in the radiation treatment planning afterwards. The patient underwent planning CT which was fused to 3T high resolution MRI with 1 mm axial slices.  These images were fused on the planning system.  We contoured the gross target volumes and subsequently expanded this to yield the Planning Target Volume. I actively participated in the planning process.  I helped to define and review the target contours and also the contours of the optic pathway, eyes, brainstem and selected nearby organs at risk.  All the dose constraints for critical structures were reviewed and compared to AAPM Task Group 101.  The prescription dose conformity was reviewed.  I approved the plan electronically.    Accordingly, [name]  was brought to the TrueBeam stereotactic radiation treatment linac and placed in the custom immobilization mask.  The patient was aligned according to the IR fiducial markers with BrainLab Exactrac, then orthogonal x-rays were used in ExacTrac with the 6DOF robotic table and the shifts were made to align the patient  [name] received stereotactic radiosurgery to a prescription dose of 20Gy uneventfully.    The detailed description of the procedure is recorded in the radiation oncology procedure note.  I was present for the duration of the procedure.  DISPOSITION:   Following delivery, the patient was transported to nursing in stable condition and monitored for possible  acute effects to be discharged to home in stable condition with follow-up in one month.  Consuella Lose, MD [facility]

## 2014-01-13 NOTE — Progress Notes (Signed)
 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 The patient then had routine yearly mammography until 10/24/2009.  Sometime in 2014 the patient tells me she noted a mass in her right breast. She states she brought this to medical attention several times, although I do not see that reflected in the outside notes we have. In any case on 12/06/2013 the patient presented to her primary clinic with a complaint of left-sided breast pain. Exam showed the right breast however to be larger than the left. There was a thickened area versus lump at the 12:00 position in the right breast above the areola. There was also a mass in the right upper quadrant of the right breast. The patient was referred to the breast Center and on 6:30 at 2015 bilateral diagnostic mammography and right breast ultrasonography at the breast Center showed a 6 cm area in the right breast upper outer quadrant with increased density associated with branching calcifications. There was a separate area more medially that also appeared abnormal. There was diffuse skin thickening and interval enlarged right axillary lymph nodes. On exam the mammographer was able to palpate a 5 cm  firm fixed palpable mass in the upper-outer quadrant of the right breast, but mild skin redness. There was also a palpable right axillary lymph node. Ultrasound confirmed a large irregular hypoechoic mass at the 11:00 position of the right breast measuring 4.1 cm maximally. More laterally a separate mass in the 9:00 position measure 4.4 cm maximally. In the right axilla there were multiple abnormal appearing axillary lymph nodes.  Biopsy of both these suspicious masses in the right breast as well as one of the suspicious lymph nodes 12/21/2013 showed ([id number]) all 3 biopsies do show identical invasive ductal carcinoma, grade 3, triple negative, with an MIB-1 of 85%. On 12/26/2013 the patient underwent bilateral breast MRI. This showed an abnormal area measuring 7.2 cm in the central right breast involving or 4 quadrants and centered superiorly, with diffuse skin thickening as well as level I and 2 metastatic right axillary adenopathy. There was also an enlarged right internal mammary lymph node. The left side was unremarkable.  The patient's subsequent history is as detailed below  INTERVAL HISTORY: The patient returns today with her granddaughter for followup of her metastatic breast cancer. After the last visit here she was found to have a 1.7 cm left parietal mass. She has met with radiation oncology and is scheduled for a single stereotactic radiotherapy treatment later today. In addition the patient was found to have a 2.7 cm spiculated nodule in the right upper lobe, "with morphology favoring a primary breast carcinoma over pulmonary" primary, and diffuse bone metastases. The patient is here today to discuss systemic therapy for her stage IV tumor  REVIEW OF SYSTEMS: [name] is tolerating the Decadron well. She   uses lorazepam for sleep. She tells me her vision is blurred and she is a little lightheaded in the mornings. Her nerves are "torn up " and she feels very jumpy. She sleeps poorly. She has  a headache which is throbbing and intermittent. She has a little bit of a running nose, hoarseness, and a dry cough. She feels short of breath only when walking up stairs or up a slope. Her appetite is poor. She has heartburn. She feels anxious and depressed. She is having moderate hot flashes. A detailed review of systems today was otherwise stable  PAST MEDICAL HISTORY: Past Medical History  Diagnosis Date  . Acid reflux   . Asthma   . COPD (chronic obstructive pulmonary disease)   . Cancer     lumpectomy, radiation 2009  . Seasonal allergies   . Alcohol abuse   . Elevated d-dimer     negative chest CT  . Allergy   . Emphysema of lung   . Hot flashes   . Reading disorder   . Impaired writing skills   . Wears glasses   . Wears dentures     top  . HOH (hard of hearing)     left ear    PAST SURGICAL HISTORY: Past Surgical History  Procedure Laterality Date  . Breast lumpectomy    . Abdominal hysterectomy    . Portacath placement Left 12/30/2013    Procedure: INSERTION PORT-A-CATH;  Surgeon: Paul S Toth III, MD;  Location:  SURGERY CENTER;  Service: General;  Laterality: Left;  subclavian area    FAMILY HISTORY Family History  Problem Relation Age of Onset  . Hypertension Other    the patient has little information about her father or his side of the family. The patient's mother died at the age of 40. The patient had 2 brothers and 4 sisters. There is no history of breast or ovarian cancer in the family to her knowledge.  GYNECOLOGIC HISTORY:  No LMP recorded. Patient has had a hysterectomy. The patient does not remember when she started menstruating. First live birth age 14. She is GX P1. She underwent hysterectomy approximately 12 years ago, but does not know whether she had bilateral salpingo-oophorectomy. 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 She is married but separated from her husband  Robert Futch, who lives in [place]. The patient lives by herself, with no pets. Her daughter Tracy Eleazer works as a clinical administrative coordinator for [employer] in [place] the patient has 3 grandchildren and 2 great-grandchildren. She is a [employer].    ADVANCED DIRECTIVES: Not in place; at her 12/28/2013 visit the patient was given the appropriate documents to complete and notarize so she may declare a healthcare power of attorney at her discretion   HEALTH MAINTENANCE: History  Substance Use Topics  . Smoking status: Current Every Day Smoker -- 1.00 packs/day for 30 years    Types: Cigarettes  . Smokeless tobacco: Not on file  . Alcohol Use: Yes     Comment: occ beer     Colonoscopy: Never  PAP: Status post hysterectomy  Bone density: Never  Lipid panel:  Allergies  Allergen Reactions  . Penicillins Other (See Comments)    Reaction a long time ago    Current Outpatient Prescriptions  Medication Sig Dispense Refill  . albuterol (PROVENTIL HFA;VENTOLIN HFA) 108 (90 BASE) MCG/ACT inhaler Inhale 2 puffs into the lungs every 6 (six) hours   as needed for wheezing or shortness of breath. For shortness of breath  3 Inhaler  3  . Aspirin-Acetaminophen-Caffeine (GOODYS EXTRA STRENGTH) [phone] MG PACK Take by mouth.      . carbamide peroxide (DEBROX) 6.5 % otic solution Place 5 drops into both ears 2 (two) times daily.  15 mL  1  . Clobetasol Prop Emollient Base 0.05 % emollient cream Apply 1 application topically 2 (two) times daily. until symptoms resolve. Use scalp applicator  30 g  5  . dexamethasone (DECADRON) 4 MG tablet 2 tabs po bid TAKE FIRST DOSE ASAP  40 tablet  3  . fluticasone (FLOVENT HFA) 220 MCG/ACT inhaler Inhale 1 puff into the lungs 2 (two) times daily.  3 Inhaler  3  . HYDROmorphone (DILAUDID) 4 MG tablet Take 1 tablet (4 mg total) by mouth every 4 (four) hours as needed for severe pain.  60 tablet  0  . hydrOXYzine (ATARAX/VISTARIL) 25 MG tablet Take 1  tablet (25 mg total) by mouth every 8 (eight) hours as needed for itching.  60 tablet  2  . loratadine (CLARITIN) 10 MG tablet Take 1 tablet (10 mg total) by mouth daily.  30 tablet  10  . nicotine (NICODERM CQ) 21 mg/24hr patch Place 1 patch (21 mg total) onto the skin daily.  28 patch  0  . oxyCODONE-acetaminophen (ROXICET) 5-325 MG per tablet Take 1-2 tablets by mouth every 4 (four) hours as needed.  50 tablet  0  . pantoprazole (PROTONIX) 40 MG tablet Take 1 tablet (40 mg total) by mouth daily.  30 tablet  2  . Vitamin D, Ergocalciferol, (DRISDOL) 50000 UNITS CAPS capsule Take 1 capsule (50,000 Units total) by mouth every 7 (seven) days.  12 capsule  0   No current facility-administered medications for this visit.    OBJECTIVE: Middle-aged white African American woman in no acute distress Filed Vitals:   01/13/14 1551  BP: 172/73  Pulse: 57  Temp: 98 F (36.7 C)  Resp: 18     Body mass index is 21.92 kg/(m^2).    ECOG FS:1 - Symptomatic but completely ambulatory  Ocular: Sclerae unicteric, EOMs intact Ear-nose-throat: Oropharynx clear and moist, teeth in poor repair Lymphatic: No cervical or supraclavicular adenopathy Lungs no rales or rhonchi Heart regular rate and rhythm, no murmur appreciated Abd soft, nontender, positive bowel sounds MSK no focal spinal tenderness Neuro: non-focal, well-oriented, anxious affect Breasts: Breast exam was not repeated. There is a palpable fixed mass in the right axilla. The left axilla was benign   LAB RESULTS:  CMP     Component Value Date/Time   NA 137 12/28/2013 1229   NA 139 10/18/2013 1438   K 5.2* 12/28/2013 1229   K 5.0 10/18/2013 1438   CL 104 10/18/2013 1438   CO2 20* 12/28/2013 1229   CO2 26 10/18/2013 1438   GLUCOSE 101 12/28/2013 1229   GLUCOSE 85 10/18/2013 1438   BUN 6.1* 12/28/2013 1229   BUN 8 10/18/2013 1438   CREATININE 0.8 12/28/2013 1229   CREATININE 0.76 10/18/2013 1438   CREATININE 0.56 02/25/2012 0853   CALCIUM 9.2 12/28/2013  1229   CALCIUM 9.6 10/18/2013 1438   PROT 7.4 12/28/2013 1229   PROT 6.9 10/18/2013 1438   ALBUMIN 3.9 12/28/2013 1229   ALBUMIN 4.0 10/18/2013 1438   AST 26 12/28/2013 1229   AST 19 10/18/2013 1438   ALT 9 12/28/2013 1229   ALT 10 10/18/2013 1438   ALKPHOS 96 12/28/2013  1229   ALKPHOS 82 10/18/2013 1438   BILITOT 0.20 12/28/2013 1229   BILITOT 0.2 10/18/2013 1438   GFRNONAA 87 10/18/2013 1438   GFRNONAA >90 02/25/2012 0853   GFRAA >89 10/18/2013 1438   GFRAA >90 02/25/2012 0853    I No results found for this basename: SPEP,  UPEP,   kappa and lambda light chains    Lab Results  Component Value Date   WBC 8.0 12/28/2013   NEUTROABS 4.4 12/28/2013   HGB 14.0 12/28/2013   HCT 43.1 12/28/2013   MCV 98.1 12/28/2013   PLT 224 12/28/2013      Chemistry      Component Value Date/Time   NA 137 12/28/2013 1229   NA 139 10/18/2013 1438   K 5.2* 12/28/2013 1229   K 5.0 10/18/2013 1438   CL 104 10/18/2013 1438   CO2 20* 12/28/2013 1229   CO2 26 10/18/2013 1438   BUN 6.1* 12/28/2013 1229   BUN 8 10/18/2013 1438   CREATININE 0.8 12/28/2013 1229   CREATININE 0.76 10/18/2013 1438   CREATININE 0.56 02/25/2012 0853      Component Value Date/Time   CALCIUM 9.2 12/28/2013 1229   CALCIUM 9.6 10/18/2013 1438   ALKPHOS 96 12/28/2013 1229   ALKPHOS 82 10/18/2013 1438   AST 26 12/28/2013 1229   AST 19 10/18/2013 1438   ALT 9 12/28/2013 1229   ALT 10 10/18/2013 1438   BILITOT 0.20 12/28/2013 1229   BILITOT 0.2 10/18/2013 1438       No results found for this basename: LABCA2    No components found with this basename: LABCA125    No results found for this basename: INR,  in the last 168 hours  Urinalysis    Component Value Date/Time   COLORURINE YELLOW 12/03/2012 1601   APPEARANCEUR CLEAR 12/03/2012 1601   LABSPEC 1.018 12/03/2012 1601   PHURINE 6.0 12/03/2012 1601   GLUCOSEU NEG 12/03/2012 1601   GLUCOSEU NEG mg/dL 07/09/2007 2022   HGBUR NEG 12/03/2012 1601   BILIRUBINUR NEG 12/03/2012 1601   KETONESUR NEG 12/03/2012 1601    PROTEINUR NEG 12/03/2012 1601   UROBILINOGEN 0.2 12/03/2012 1601   NITRITE NEG 12/03/2012 1601   LEUKOCYTESUR SMALL* 12/03/2012 1601    STUDIES: Mr Jeri Cos Wo Contrast  January 24, 2014   CLINICAL DATA:  History of breast cancer.  Headaches.  EXAM: MRI HEAD WITHOUT AND WITH CONTRAST  TECHNIQUE: Multiplanar, multiecho pulse sequences of the brain and surrounding structures were obtained without and with intravenous contrast.  CONTRAST:  11m MULTIHANCE GADOBENATE DIMEGLUMINE 529 MG/ML IV SOLN  COMPARISON:  07/25/2008 head CT.  No comparison MR.  FINDINGS: Per MR technologist, patient became nauseous and vomited after receiving contrast.  1.5 x 1.3 x 1.4 cm enhancing partially hemorrhagic lesion posterior left operculum region at the inferior border of the left frontal -parietal junction with mild surrounding vasogenic edema. Although this is peripherally located, this appears to be intra-axial rather than extra-axial (and therefore felt not to represent a meningioma). Given the patient's history, this is suspicious for intracranial metastatic disease with primary brain tumor a secondary less likely consideration.  Heterogeneous appearance of the calvarium and bone marrow of the visualized aspects of the cervical spine. Although it is possible patient's habitus, osteoporosis and/or anemia partially contribute to this appearance findings are suspicious for a osseous metastatic disease with most notable metastatic lesion occipital aspect of the calvarium.  Small vessel disease type changes subcortical and periventricular region as well as involving  the pons.  No acute infarct.  No intracranial hemorrhage separate from the above described lesion.  Opacification left sphenoid sinus with surrounding bony thickening similar to the prior CT suggesting chronic sinusitis type changes with inspissation of centrally contain material. Fungal disease can cause a similar appearance on MR but felt to be on likely given the chronicity  findings.  Small lobulated cystic appearing aspect of the left lacrimal gland of questionable etiology.  No hydrocephalus.  Right vertebral artery appears small and may terminate in a posterior inferior cerebellar artery distribution. Small basilar artery. Internal carotid arteries are patent.  IMPRESSION: 1.5 x 1.3 x 1.4 cm enhancing partially hemorrhagic lesion posterior left operculum region at the inferior border of the left frontal -parietal junction with mild surrounding vasogenic edema. Given the patient's history, this is suspicious for intracranial metastatic disease.  Findings are suspicious for a osseous metastatic disease with most notable metastatic lesion occipital aspect of the calvarium.  Small vessel disease type changes.  No acute infarct.  Opacification left sphenoid sinus with surrounding bony thickening similar to the prior CT suggesting chronic sinusitis type changes with inspissation of centrally contain material.  Small lobulated cystic appearing aspect of the left lacrimal gland of questionable etiology.  Per MR technologist, patient became nauseous and vomited after receiving contrast.  Please see above for further detail.  These results will be called to the ordering clinician or representative by the Radiologist Assistant, and communication documented in the PACS or zVision Dashboard.   Electronically Signed   By: Steve  Olson M.D.   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 Status post left lumpectomy and radiation therapy for breast cancer in 2009.  LABS:  None obtained today.  EXAM: BILATERAL BREAST MRI WITH AND WITHOUT CONTRAST  TECHNIQUE: Multiplanar, multisequence MR images of both breasts were obtained prior to and following the intravenous administration of 10ml of MultiHance.  THREE-DIMENSIONAL MR IMAGE RENDERING ON  INDEPENDENT WORKSTATION:  Three-dimensional MR images were rendered by post-processing of the original MR data on an independent workstation. The three-dimensional MR images were interpreted, and findings are reported in the following complete MRI report for this study. Three dimensional images were evaluated at the independent DynaCad workstation  COMPARISON:  Recent mammogram, ultrasound and biopsy examinations.  FINDINGS: Breast composition: c:  Heterogeneous fibroglandular tissue  Background parenchymal enhancement: Minimal  Right breast: Large, irregular enhancing mass in the central right breast, involving all four quadrants, centered superiorly. This extends to the posterior aspect of the nipple without evidence of extension into the nipple. This is not involving the pectoralis muscle. This measures 7.2 x 4.5 x 3.7 cm in maximum dimensions. This contains biopsy marker clip artifacts posteriorly and laterally. There is also diffuse right breast skin thickening throughout the upper half of the breast with associated abnormal skin enhancement.  Left breast: Post lumpectomy changes with no mass or enhancement suspicious for malignancy.  Lymph nodes: Multiple abnormal appearing right axillary lymph nodes with loss of the normal fatty hila. These include level 1 nodes and a 10 mm short axis node extending posterior to the lateral aspect of the pectoralis minor muscle on inversion recovery image number 3. There is also an abnormal appearing right internal mammary lymph node with a short axis diameter of 5 mm on image number 59 of series 13.  Ancillary findings:    None.  IMPRESSION: 1. 7.2 x 4.5 x 3.7 cm biopsy-proven invasive ductal carcinoma in in the central right breast, involving all 4 quadrants, centered superiorly. 2. Diffuse skin thickening and enhancement in the superior half of the right breast, suspicious for dermal invasion of malignancy. 3. Level 1 and level 2 metastatic right axillary adenopathy. 4.  Metastatic right internal mammary adenopathy. 5. Post lumpectomy changes on the left without evidence of recurrent malignancy in the left breast.  RECOMMENDATION: Treatment plan.  BI-RADS CATEGORY  6: Known biopsy-proven malignancy.   Electronically Signed   By: Enrique Sack M.D.   On: 12/26/2013 15:33   Nm Pet Image Initial (pi) Skull Base To Thigh  01/05/2014   CLINICAL DATA:  Initial treatment strategy for right breast carcinoma. Personal history of left breast carcinoma.  EXAM: NUCLEAR MEDICINE PET SKULL BASE TO THIGH  TECHNIQUE: 6.3 mCi F-18 FDG was injected intravenously. Full-ring PET imaging was performed from the skull base to thigh after the radiotracer. CT data was obtained and used for attenuation correction and anatomic localization.  FASTING BLOOD GLUCOSE:  Value: 104 mg/dl  COMPARISON:  12/20/2008  FINDINGS: NECK  No hypermetabolic lymph nodes in the neck.  CHEST  4 cm mass in the right breast shows hypermetabolic activity, with SUV max of 4.8. There is also diffuse hypermetabolic skin thickening involving the right breast, consistent with inflammatory breast carcinoma.  Mild hypermetabolic lymphadenopathy is seen within the right axilla, right hilum, and mediastinum in the right paratracheal region. SUV max seen in the right paratracheal region measures 8.2. No hypermetabolic lymphadenopathy seen along the internal mammary chains or left hilum.  A spiculated nodule is seen in the medial left upper lobe which measures 2.7 cm, and is having maximum SUV of 9.0. The morphology favors a primary bronchogenic carcinoma over pulmonary metastases. No other hypermetabolic pulmonary nodules are identified.  ABDOMEN/PELVIS  No abnormal hypermetabolic activity within the liver, pancreas, adrenal glands, or spleen. No hypermetabolic lymph nodes in the abdomen or pelvis.  SKELETON  Diffuse hypermetabolic bone metastases are seen throughout the skeleton.  IMPRESSION: Hypermetabolic 4 cm right breast mass and  overlying skin thickening, consistent with inflammatory breast carcinoma.  2.7 cm hypermetabolic spiculated nodule in the right upper lobe, with morphology favoring a primary breast carcinoma over pulmonary metastases.  Hypermetabolic lymphadenopathy in the right axilla, right hilum, and right paratracheal region, consistent with metastatic disease.  Diffuse bone metastases.   Electronically Signed   By: Earle Gell M.D.   On: 01/05/2014 16:10   ASSESSMENT: 58 y.o. [place] woman with stage IV breast cancer involving brain, lung, and bones   (1) status post right breast biopsy of 2 separate masses in the right axillary lymph node 12/21/2013 showing a clinical T3 N3, stage IIIC invasive ductal carcinoma, triple negative, with an MIB-1 of 85%; staging studies 01/04/2014 show metastatic disease to the brain, lung, and bones.  (2) status post stereotactic radiosurgery to a 1.7 cm left parietal lesion: 20 Gy given 01/13/2014  (3) systemic therapy will consist of neoadjuvant eribulin, given days 1 and 8 of each 21 day cycle  (4) zolendronate to be started once the patient's dental condition has been optimized  (5) right mastectomy for local control to be considered once systemic disease comes under good control  (6) also status post left lumpectomy and sentinel lymph node sampling 11/23/2007 for a pTis pN0, stage 0 ductal carcinoma in situ, high-grade, estrogen and progesterone receptor negative, status post adjuvant radiation  PLAN: I  spent approximately 50 minutes with the patient and her granddaughter going over her situation. [name] understands chemotherapy generally does not cross the blood-brain barrier, so treatment to the brain will largely consist of radiation. She will have periodic brain MRIs following her stereotactic radiosurgery today.  Since stage IV breast cancer is not curable, the goal of treatment will be control. We are going to start with eribulin, given days 1 and 8 of each 21  day cycle. We will restage after 4 cycles. We will also start zolendronate once the patient's very poor dental condition has been taking care of.  Today we discussed the possible toxicities, side effects and complications of a revealing. We will try to start therapy next week. I have placed pharmacy orders for the patient's supportive medications and discuss how to take these. The patient will be on a Decadron taper through radiation oncology.   [name] has a good understanding of the overall plan. She agrees with it. She knows the goal of treatment in her case is control. She will call with any problems that may develop before her next visit here.  Chauncey Cruel, MD   01/13/2014 4:14 PM

## 2014-01-13 NOTE — Patient Instructions (Signed)
Take decadron 2 tablets BID for 5 days. Then take 1 tablet x3 per day for 5 days. Then take 1 tablet x2 per day for 5 days. Then take 1 tablet x1 per day for 5 days. Then take 1/2 tablet x1 per day for 5 days.

## 2014-01-13 NOTE — Progress Notes (Signed)
  Radiation Oncology         [phone] ________________________________  Name: [name] MRN: [id number]  Date: 01/09/2014  DOB: 1956/02/25  SIMULATION AND TREATMENT PLANNING NOTE  DIAGNOSIS:  Metastatic breast cancer  NARRATIVE:  The patient was brought to the [facility].  Identity was confirmed.  All relevant records and images related to the planned course of therapy were reviewed.  The patient freely provided informed written consent to proceed with treatment after reviewing the details related to the planned course of therapy. The consent form was witnessed and verified by the simulation staff. Intravenous access was established for contrast administration. Then, the patient was set-up in a stable reproducible supine position for radiation therapy.  A relocatable thermoplastic stereotactic head frame was fabricated for precise immobilization.  CT images were obtained.  Surface markings were placed.  The CT images were loaded into the planning software and fused with the patient's targeting MRI scan.  Then the target and avoidance structures were contoured.  Treatment planning then occurred.  The radiation prescription was entered and confirmed.  I have requested 3D planning  I have requested a DVH of the following structures: Brain stem, brain, left eye, right I, lenses, optic chiasm, target volumes, uninvolved brain, and normal tissue.    PLAN:  The patient will receive 20 Gy in 1 fraction.  ________________________________  Jodelle Gross, MD, PhD

## 2014-01-13 NOTE — Progress Notes (Signed)
  Radiation Oncology         [phone] ________________________________  Name: [name] MRN: [id number]  Date: 01/13/2014  DOB: Apr 18, 1956  End of Treatment Note  Diagnosis:   Metastatic breast cancer     Indication for treatment:  palliaitve       Radiation treatment dates:   01/13/14  Site/dose:    Left parietal 13mm target was treated using 3 Arcs to a prescription dose of 20 Gy. ExacTrac Snap verification was performed for each couch angle.   Narrative: The patient tolerated radiation treatment relatively well.   The patient exhibited no toxicity after treatment. She was given instructions regarding a steroid taper.  Plan: The patient has completed radiation treatment. The patient will return to radiation oncology clinic for routine followup in one month. I advised the patient to call or return sooner if they have any questions or concerns related to their recovery or treatment. ________________________________  Jodelle Gross, M.D., Ph.D.

## 2014-01-14 MED ORDER — PROCHLORPERAZINE MALEATE 10 MG PO TABS: 10.0000 mg | ORAL_TABLET | Freq: Four times a day (QID) | ORAL | Status: DC | PRN

## 2014-01-14 MED ORDER — ONDANSETRON HCL 8 MG PO TABS
8.0000 mg | ORAL_TABLET | Freq: Two times a day (BID) | ORAL | Status: DC
Start: 2014-01-14 — End: 2014-01-16

## 2014-01-14 MED ORDER — LIDOCAINE-PRILOCAINE 2.5-2.5 % EX CREA: 1.0000 "application " | TOPICAL_CREAM | CUTANEOUS | Status: AC | PRN

## 2014-01-15 DIAGNOSIS — Z510 Encounter for antineoplastic radiation therapy: Secondary | ICD-10-CM | POA: Diagnosis not present

## 2014-01-16 ENCOUNTER — Other Ambulatory Visit: Payer: Self-pay | Admitting: *Deleted

## 2014-01-16 ENCOUNTER — Telehealth: Payer: Self-pay | Admitting: Oncology

## 2014-01-16 DIAGNOSIS — C50911 Malignant neoplasm of unspecified site of right female breast: Secondary | ICD-10-CM

## 2014-01-16 DIAGNOSIS — C50411 Malignant neoplasm of upper-outer quadrant of right female breast: Secondary | ICD-10-CM

## 2014-01-16 DIAGNOSIS — C7951 Secondary malignant neoplasm of bone: Secondary | ICD-10-CM

## 2014-01-16 DIAGNOSIS — C7931 Secondary malignant neoplasm of brain: Principal | ICD-10-CM

## 2014-01-16 MED ORDER — ONDANSETRON HCL 8 MG PO TABS: 8.0000 mg | ORAL_TABLET | Freq: Two times a day (BID) | ORAL | Status: AC

## 2014-01-16 MED ORDER — DEXAMETHASONE 4 MG PO TABS: ORAL_TABLET | ORAL | Status: DC

## 2014-01-16 MED ORDER — PROCHLORPERAZINE MALEATE 10 MG PO TABS: 10.0000 mg | ORAL_TABLET | Freq: Four times a day (QID) | ORAL | Status: AC | PRN

## 2014-01-16 MED ORDER — LORAZEPAM 0.5 MG PO TABS: 0.5000 mg | ORAL_TABLET | Freq: Three times a day (TID) | ORAL | Status: DC

## 2014-01-16 NOTE — Telephone Encounter (Signed)
s.w. pt daughter and advised on 8.4 appt...she will get sched tomorrow

## 2014-01-17 ENCOUNTER — Ambulatory Visit (HOSPITAL_BASED_OUTPATIENT_CLINIC_OR_DEPARTMENT_OTHER): Payer: Medicaid Other

## 2014-01-17 ENCOUNTER — Ambulatory Visit (HOSPITAL_BASED_OUTPATIENT_CLINIC_OR_DEPARTMENT_OTHER): Payer: Medicaid Other | Admitting: Nurse Practitioner

## 2014-01-17 ENCOUNTER — Other Ambulatory Visit (HOSPITAL_BASED_OUTPATIENT_CLINIC_OR_DEPARTMENT_OTHER): Payer: Medicaid Other

## 2014-01-17 ENCOUNTER — Encounter: Payer: Self-pay | Admitting: Nurse Practitioner

## 2014-01-17 ENCOUNTER — Telehealth: Payer: Self-pay | Admitting: Nurse Practitioner

## 2014-01-17 ENCOUNTER — Other Ambulatory Visit: Payer: Self-pay | Admitting: Oncology

## 2014-01-17 ENCOUNTER — Other Ambulatory Visit: Payer: Self-pay | Admitting: *Deleted

## 2014-01-17 VITALS — BP 121/76 | HR 90 | Temp 98.7°F | Resp 18 | Ht 59.0 in | Wt 106.9 lb

## 2014-01-17 VITALS — BP 159/94 | HR 62

## 2014-01-17 DIAGNOSIS — C7951 Secondary malignant neoplasm of bone: Principal | ICD-10-CM

## 2014-01-17 DIAGNOSIS — C50919 Malignant neoplasm of unspecified site of unspecified female breast: Secondary | ICD-10-CM

## 2014-01-17 DIAGNOSIS — C50419 Malignant neoplasm of upper-outer quadrant of unspecified female breast: Secondary | ICD-10-CM

## 2014-01-17 DIAGNOSIS — C50411 Malignant neoplasm of upper-outer quadrant of right female breast: Secondary | ICD-10-CM

## 2014-01-17 DIAGNOSIS — C7931 Secondary malignant neoplasm of brain: Secondary | ICD-10-CM

## 2014-01-17 DIAGNOSIS — C7952 Secondary malignant neoplasm of bone marrow: Secondary | ICD-10-CM

## 2014-01-17 DIAGNOSIS — C50911 Malignant neoplasm of unspecified site of right female breast: Secondary | ICD-10-CM

## 2014-01-17 DIAGNOSIS — C7949 Secondary malignant neoplasm of other parts of nervous system: Secondary | ICD-10-CM

## 2014-01-17 DIAGNOSIS — C773 Secondary and unspecified malignant neoplasm of axilla and upper limb lymph nodes: Secondary | ICD-10-CM

## 2014-01-17 DIAGNOSIS — R0609 Other forms of dyspnea: Secondary | ICD-10-CM

## 2014-01-17 DIAGNOSIS — R059 Cough, unspecified: Secondary | ICD-10-CM

## 2014-01-17 DIAGNOSIS — C7800 Secondary malignant neoplasm of unspecified lung: Secondary | ICD-10-CM

## 2014-01-17 DIAGNOSIS — F17200 Nicotine dependence, unspecified, uncomplicated: Secondary | ICD-10-CM

## 2014-01-17 DIAGNOSIS — R05 Cough: Secondary | ICD-10-CM

## 2014-01-17 DIAGNOSIS — R0989 Other specified symptoms and signs involving the circulatory and respiratory systems: Secondary | ICD-10-CM

## 2014-01-17 DIAGNOSIS — Z87898 Personal history of other specified conditions: Secondary | ICD-10-CM

## 2014-01-17 DIAGNOSIS — Z171 Estrogen receptor negative status [ER-]: Secondary | ICD-10-CM

## 2014-01-17 LAB — CBC WITH DIFFERENTIAL/PLATELET
BASO%: 0.4 % (ref 0.0–2.0)
BASOS ABS: 0.1 10*3/uL (ref 0.0–0.1)
EOS%: 0.1 % (ref 0.0–7.0)
Eosinophils Absolute: 0 10*3/uL (ref 0.0–0.5)
HEMATOCRIT: 40.9 % (ref 34.8–46.6)
HEMOGLOBIN: 12.9 g/dL (ref 11.6–15.9)
LYMPH#: 1.7 10*3/uL (ref 0.9–3.3)
LYMPH%: 7.9 % — ABNORMAL LOW (ref 14.0–49.7)
MCH: 31.3 pg (ref 25.1–34.0)
MCHC: 31.7 g/dL (ref 31.5–36.0)
MCV: 98.9 fL (ref 79.5–101.0)
MONO#: 1.1 10*3/uL — ABNORMAL HIGH (ref 0.1–0.9)
MONO%: 5.3 % (ref 0.0–14.0)
NEUT#: 18.3 10*3/uL — ABNORMAL HIGH (ref 1.5–6.5)
NEUT%: 86.3 % — AB (ref 38.4–76.8)
Platelets: 133 10*3/uL — ABNORMAL LOW (ref 145–400)
RBC: 4.13 10*6/uL (ref 3.70–5.45)
RDW: 13.9 % (ref 11.2–14.5)
WBC: 21.2 10*3/uL — AB (ref 3.9–10.3)

## 2014-01-17 LAB — COMPREHENSIVE METABOLIC PANEL (CC13)
ALT: 22 U/L (ref 0–55)
ANION GAP: 11 meq/L (ref 3–11)
AST: 41 U/L — ABNORMAL HIGH (ref 5–34)
Albumin: 3.2 g/dL — ABNORMAL LOW (ref 3.5–5.0)
Alkaline Phosphatase: 88 U/L (ref 40–150)
BUN: 30 mg/dL — AB (ref 7.0–26.0)
CALCIUM: 8.3 mg/dL — AB (ref 8.4–10.4)
CHLORIDE: 105 meq/L (ref 98–109)
CO2: 24 meq/L (ref 22–29)
Creatinine: 1.1 mg/dL (ref 0.6–1.1)
Glucose: 133 mg/dl (ref 70–140)
Potassium: 3.8 mEq/L (ref 3.5–5.1)
Sodium: 140 mEq/L (ref 136–145)
Total Bilirubin: 0.25 mg/dL (ref 0.20–1.20)
Total Protein: 6.1 g/dL — ABNORMAL LOW (ref 6.4–8.3)

## 2014-01-17 LAB — TECHNOLOGIST REVIEW

## 2014-01-17 MED ORDER — HEPARIN SOD (PORK) LOCK FLUSH 100 UNIT/ML IV SOLN
500.0000 [IU] | Freq: Once | INTRAVENOUS | Status: AC | PRN
  Administered 2014-01-17: 500 [IU]
  Filled 2014-01-17: qty 5

## 2014-01-17 MED ORDER — SODIUM CHLORIDE 0.9 % IJ SOLN
10.0000 mL | INTRAMUSCULAR | Status: DC | PRN
  Administered 2014-01-17: 10 mL
  Filled 2014-01-17: qty 10

## 2014-01-17 MED ORDER — DEXAMETHASONE SODIUM PHOSPHATE 10 MG/ML IJ SOLN
INTRAMUSCULAR | Status: AC
  Filled 2014-01-17: qty 1

## 2014-01-17 MED ORDER — ONDANSETRON 8 MG/NS 50 ML IVPB
INTRAVENOUS | Status: AC
  Filled 2014-01-17: qty 8

## 2014-01-17 MED ORDER — SODIUM CHLORIDE 0.9 % IV SOLN
1.1100 mg/m2 | Freq: Once | INTRAVENOUS | Status: AC
  Administered 2014-01-17: 1.6 mg via INTRAVENOUS
  Filled 2014-01-17: qty 3.2

## 2014-01-17 MED ORDER — DEXAMETHASONE SODIUM PHOSPHATE 10 MG/ML IJ SOLN
10.0000 mg | Freq: Once | INTRAMUSCULAR | Status: AC
  Administered 2014-01-17: 10 mg via INTRAVENOUS

## 2014-01-17 MED ORDER — ONDANSETRON 8 MG/50ML IVPB (CHCC)
8.0000 mg | Freq: Once | INTRAVENOUS | Status: AC
  Administered 2014-01-17: 8 mg via INTRAVENOUS

## 2014-01-17 MED ORDER — SODIUM CHLORIDE 0.9 % IV SOLN
Freq: Once | INTRAVENOUS | Status: AC
  Administered 2014-01-17: 16:00:00 via INTRAVENOUS

## 2014-01-17 NOTE — Progress Notes (Signed)
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 The patient then had routine yearly mammography until 10/24/2009.  Sometime in 2014 the patient tells me she noted a mass in her right breast. She states she brought this to medical attention several times, although I do not see that reflected in the outside notes we have. In any case on 12/06/2013 the patient presented to her primary clinic with a complaint of left-sided breast pain. Exam showed the right breast however to be larger than the left. There was a thickened area versus lump at the 12:00 position in the right breast above the areola. There was also a mass in the right upper quadrant of the right breast. The patient was referred to the breast Center and on 6:30 at 2015 bilateral diagnostic mammography and right breast ultrasonography at the breast Center showed a 6 cm area in the right breast upper outer quadrant with increased density associated with branching calcifications. There was a separate area more medially that also appeared abnormal. There was diffuse skin thickening and interval enlarged right axillary lymph nodes. On exam the mammographer was able to palpate a 5 cm firm  fixed palpable mass in the upper-outer quadrant of the right breast, but mild skin redness. There was also a palpable right axillary lymph node. Ultrasound confirmed a large irregular hypoechoic mass at the 11:00 position of the right breast measuring 4.1 cm maximally. More laterally a separate mass in the 9:00 position measure 4.4 cm maximally. In the right axilla there were multiple abnormal appearing axillary lymph nodes.  Biopsy of both these suspicious masses in the right breast as well as one of the suspicious lymph nodes 12/21/2013 showed ([id number]) all 3 biopsies do show identical invasive ductal carcinoma, grade 3, triple negative, with an MIB-1 of 85%. On 12/26/2013 the patient underwent bilateral breast MRI. This showed an abnormal area measuring 7.2 cm in the central right breast involving or 4 quadrants and centered superiorly, with diffuse skin thickening as well as level I and 2 metastatic right axillary adenopathy. There was also an enlarged right internal mammary lymph node. The left side was unremarkable.  The patient's subsequent history is as detailed below  INTERVAL HISTORY: The patient returns today with her daughter, [name] for followup of her metastatic breast cancer. Today is day 1 cycle 1 of eribulin, given on day 1 and 8 of each 21 day cycle.   REVIEW OF SYSTEMS: [name] is tolerating the decadron taper better, she feels less jumpy and is sleeping better with the lorazepam. She still has intermittent, throbbing headaches, but they have lessened in intensity. Her vision is still blurry and she occasionally sees spots. She still has a dry cough and shortness of breath with exertion. She is working on quitting smoking. Her appetite is poor and she still has  heartburn. She is having a moderate level of hot flashes. She is very anxious about chemo today. A detailed review of systems was otherwise negative.  PAST MEDICAL HISTORY: Past Medical History  Diagnosis Date  . Acid  reflux   . Asthma   . COPD (chronic obstructive pulmonary disease)   . Cancer     lumpectomy, radiation 2009  . Seasonal allergies   . Alcohol abuse   . Elevated d-dimer     negative chest CT  . Allergy   . Emphysema of lung   . Hot flashes   . Reading disorder   . Impaired writing skills   . Wears glasses   . Wears dentures     top  . HOH (hard of hearing)     left ear    PAST SURGICAL HISTORY: Past Surgical History  Procedure Laterality Date  . Breast lumpectomy    . Abdominal hysterectomy    . Portacath placement Left 12/30/2013    Procedure: INSERTION PORT-A-CATH;  Surgeon: Merrie Roof, MD;  Location: [facility];  Service: General;  Laterality: Left;  subclavian area    FAMILY HISTORY Family History  Problem Relation Age of Onset  . Hypertension Other    the patient has little information about her father or his side of the family. The patient's mother died at the age of 36. The patient had 2 brothers and 4 sisters. There is no history of breast or ovarian cancer in the family to her knowledge.  GYNECOLOGIC HISTORY:  No LMP recorded. Patient has had a hysterectomy. The patient does not remember when she started menstruating. First live birth age 18. She is GX P1. She underwent hysterectomy approximately 12 years ago, but does not know whether she had bilateral salpingo-oophorectomy. 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 She is married but separated from her husband [name], who lives in [place]. The patient lives by herself, with no pets. Her daughter [name] works as a Stage manager for [employer] care in [place] the patient has 3 grandchildren and 2 great-grandchildren. She is a Psychologist, forensic.    ADVANCED DIRECTIVES: Not in place; at her 12/28/2013 visit the patient was given the appropriate documents to complete and notarize so she may declare a  healthcare power of attorney at her discretion   HEALTH MAINTENANCE: History  Substance Use Topics  . Smoking status: Current Every Day Smoker -- 1.00 packs/day for 30 years    Types: Cigarettes  . Smokeless tobacco: Not on file  . Alcohol Use: Yes     Comment: occ beer     Colonoscopy: Never  PAP: Status post hysterectomy  Bone density: Never  Lipid panel:  Allergies  Allergen Reactions  . Penicillins Other (See Comments)    Reaction a long time ago    Current Outpatient Prescriptions  Medication Sig Dispense Refill  . albuterol (PROVENTIL HFA;VENTOLIN HFA) 108 (90 BASE) MCG/ACT inhaler Inhale 2 puffs into the lungs every 6 (six) hours as needed for wheezing or shortness of breath. For shortness of breath  3 Inhaler  3  . Clobetasol Prop Emollient Base 0.05 % emollient cream Apply 1 application topically 2 (two) times daily. until symptoms resolve. Use scalp applicator  30 g  5  . dexamethasone (DECADRON) 4 MG tablet 2 tabs po bid TAKE FIRST DOSE ASAP  40 tablet  3  . lidocaine-prilocaine (EMLA) cream Apply 1  application topically as needed. Apply over port area 1-2 hours before chemo, cover with plastic wrap  30 g  0  . loratadine (CLARITIN) 10 MG tablet Take 1 tablet (10 mg total) by mouth daily.  30 tablet  10  . LORazepam (ATIVAN) 0.5 MG tablet Take 1 tablet (0.5 mg total) by mouth every 8 (eight) hours.  30 tablet  0  . ondansetron (ZOFRAN) 8 MG tablet Take 1 tablet (8 mg total) by mouth 2 (two) times daily. Start the day after chemo for 2 days. Then take as needed for nausea or vomiting.  30 tablet  1  . Aspirin-Acetaminophen-Caffeine (GOODYS EXTRA STRENGTH) [phone] MG PACK Take by mouth.      . carbamide peroxide (DEBROX) 6.5 % otic solution Place 5 drops into both ears 2 (two) times daily.  15 mL  1  . fluticasone (FLOVENT HFA) 220 MCG/ACT inhaler Inhale 1 puff into the lungs 2 (two) times daily.  3 Inhaler  3  . HYDROmorphone (DILAUDID) 4 MG tablet Take 1 tablet (4 mg  total) by mouth every 4 (four) hours as needed for severe pain.  60 tablet  0  . hydrOXYzine (ATARAX/VISTARIL) 25 MG tablet Take 1 tablet (25 mg total) by mouth every 8 (eight) hours as needed for itching.  60 tablet  2  . nicotine (NICODERM CQ) 21 mg/24hr patch Place 1 patch (21 mg total) onto the skin daily.  28 patch  0  . oxyCODONE-acetaminophen (ROXICET) 5-325 MG per tablet Take 1-2 tablets by mouth every 4 (four) hours as needed.  50 tablet  0  . pantoprazole (PROTONIX) 40 MG tablet Take 1 tablet (40 mg total) by mouth daily.  30 tablet  2  . prochlorperazine (COMPAZINE) 10 MG tablet Take 1 tablet (10 mg total) by mouth every 6 (six) hours as needed (Nausea or vomiting).  30 tablet  1  . Vitamin D, Ergocalciferol, (DRISDOL) 50000 UNITS CAPS capsule Take 1 capsule (50,000 Units total) by mouth every 7 (seven) days.  12 capsule  0   No current facility-administered medications for this visit.    OBJECTIVE: Middle-aged white African American woman in no acute distress Filed Vitals:   01/17/14 1419  BP: 121/76  Pulse: 90  Temp: 98.7 F (37.1 C)  Resp: 18     Body mass index is 21.58 kg/(m^2).    ECOG FS:1 - Symptomatic but completely ambulatory  Skin: warm, dry  HEENT: sclerae anicteric, conjunctivae pink, oropharynx clear. No thrush or mucositis.  Lymph Nodes: No cervical or supraclavicular lymphadenopathy  Lungs: clear to auscultation bilaterally, no rales, wheezes, or rhonci  Heart: regular rate and rhythm  Abdomen: round, soft, non tender, positive bowel sounds  Musculoskeletal: No focal spinal tenderness, no peripheral edema  Neuro: non focal, well oriented, anxious affect  Breasts: deferred  LAB RESULTS:  CMP     Component Value Date/Time   NA 140 01/17/2014 1412   NA 139 10/18/2013 1438   K 3.8 01/17/2014 1412   K 5.0 10/18/2013 1438   CL 104 10/18/2013 1438   CO2 24 01/17/2014 1412   CO2 26 10/18/2013 1438   GLUCOSE 133 01/17/2014 1412   GLUCOSE 85 10/18/2013 1438   BUN 30.0*  01/17/2014 1412   BUN 8 10/18/2013 1438   CREATININE 1.1 01/17/2014 1412   CREATININE 0.76 10/18/2013 1438   CREATININE 0.56 02/25/2012 0853   CALCIUM 8.3* 01/17/2014 1412   CALCIUM 9.6 10/18/2013 1438   PROT 6.1* 01/17/2014 1412  PROT 6.9 10/18/2013 1438   ALBUMIN 3.2* 01/17/2014 1412   ALBUMIN 4.0 10/18/2013 1438   AST 41* 01/17/2014 1412   AST 19 10/18/2013 1438   ALT 22 01/17/2014 1412   ALT 10 10/18/2013 1438   ALKPHOS 88 01/17/2014 1412   ALKPHOS 82 10/18/2013 1438   BILITOT 0.25 01/17/2014 1412   BILITOT 0.2 10/18/2013 1438   GFRNONAA 87 10/18/2013 1438   GFRNONAA >90 02/25/2012 0853   GFRAA >89 10/18/2013 1438   GFRAA >90 02/25/2012 0853    I No results found for this basename: SPEP,  UPEP,   kappa and lambda light chains    Lab Results  Component Value Date   WBC 21.2* 01/17/2014   NEUTROABS 18.3* 01/17/2014   HGB 12.9 01/17/2014   HCT 40.9 01/17/2014   MCV 98.9 01/17/2014   PLT 133* 01/17/2014      Chemistry      Component Value Date/Time   NA 140 01/17/2014 1412   NA 139 10/18/2013 1438   K 3.8 01/17/2014 1412   K 5.0 10/18/2013 1438   CL 104 10/18/2013 1438   CO2 24 01/17/2014 1412   CO2 26 10/18/2013 1438   BUN 30.0* 01/17/2014 1412   BUN 8 10/18/2013 1438   CREATININE 1.1 01/17/2014 1412   CREATININE 0.76 10/18/2013 1438   CREATININE 0.56 02/25/2012 0853      Component Value Date/Time   CALCIUM 8.3* 01/17/2014 1412   CALCIUM 9.6 10/18/2013 1438   ALKPHOS 88 01/17/2014 1412   ALKPHOS 82 10/18/2013 1438   AST 41* 01/17/2014 1412   AST 19 10/18/2013 1438   ALT 22 01/17/2014 1412   ALT 10 10/18/2013 1438   BILITOT 0.25 01/17/2014 1412   BILITOT 0.2 10/18/2013 1438       No results found for this basename: LABCA2    No components found with this basename: LABCA125    No results found for this basename: INR,  in the last 168 hours  Urinalysis    Component Value Date/Time   COLORURINE YELLOW 12/03/2012 [place] 12/03/2012 1601   LABSPEC 1.018 12/03/2012 1601   PHURINE 6.0 12/03/2012 1601   GLUCOSEU NEG  12/03/2012 1601   GLUCOSEU NEG mg/dL 07/09/2007 2022   HGBUR NEG 12/03/2012 1601   BILIRUBINUR NEG 12/03/2012 1601   KETONESUR NEG 12/03/2012 1601   PROTEINUR NEG 12/03/2012 1601   UROBILINOGEN 0.2 12/03/2012 1601   NITRITE NEG 12/03/2012 1601   LEUKOCYTESUR SMALL* 12/03/2012 1601    STUDIES: Mr Jeri Cos Wo Contrast  01/07/2014   CLINICAL DATA:  History of breast cancer.  Headaches.  EXAM: MRI HEAD WITHOUT AND WITH CONTRAST  TECHNIQUE: Multiplanar, multiecho pulse sequences of the brain and surrounding structures were obtained without and with intravenous contrast.  CONTRAST:  55mL MULTIHANCE GADOBENATE DIMEGLUMINE 529 MG/ML IV SOLN  COMPARISON:  07/25/2008 head CT.  No comparison MR.  FINDINGS: Per MR technologist, patient became nauseous and vomited after receiving contrast.  1.5 x 1.3 x 1.4 cm enhancing partially hemorrhagic lesion posterior left operculum region at the inferior border of the left frontal -parietal junction with mild surrounding vasogenic edema. Although this is peripherally located, this appears to be intra-axial rather than extra-axial (and therefore felt not to represent a meningioma). Given the patient's history, this is suspicious for intracranial metastatic disease with primary brain tumor a secondary less likely consideration.  Heterogeneous appearance of the calvarium and bone marrow of the visualized aspects of the cervical spine. Although  it is possible patient's habitus, osteoporosis and/or anemia partially contribute to this appearance findings are suspicious for a osseous metastatic disease with most notable metastatic lesion occipital aspect of the calvarium.  Small vessel disease type changes subcortical and periventricular region as well as involving the pons.  No acute infarct.  No intracranial hemorrhage separate from the above described lesion.  Opacification left sphenoid sinus with surrounding bony thickening similar to the prior CT suggesting chronic sinusitis type changes  with inspissation of centrally contain material. Fungal disease can cause a similar appearance on MR but felt to be on likely given the chronicity findings.  Small lobulated cystic appearing aspect of the left lacrimal gland of questionable etiology.  No hydrocephalus.  Right vertebral artery appears small and may terminate in a posterior inferior cerebellar artery distribution. Small basilar artery. Internal carotid arteries are patent.  IMPRESSION: 1.5 x 1.3 x 1.4 cm enhancing partially hemorrhagic lesion posterior left operculum region at the inferior border of the left frontal -parietal junction with mild surrounding vasogenic edema. Given the patient's history, this is suspicious for intracranial metastatic disease.  Findings are suspicious for a osseous metastatic disease with most notable metastatic lesion occipital aspect of the calvarium.  Small vessel disease type changes.  No acute infarct.  Opacification left sphenoid sinus with surrounding bony thickening similar to the prior CT suggesting chronic sinusitis type changes with inspissation of centrally contain material.  Small lobulated cystic appearing aspect of the left lacrimal gland of questionable etiology.  Per MR technologist, patient became nauseous and vomited after receiving contrast.  Please see above for further detail.  These results will be called to the ordering clinician or representative by the Radiologist Assistant, and communication documented in the PACS or zVision Dashboard.   Electronically Signed   By: Chauncey Cruel M.D.   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 Status post left lumpectomy and radiation therapy for breast cancer in 2009.  LABS:  None obtained today.  EXAM: BILATERAL BREAST MRI WITH AND WITHOUT CONTRAST  TECHNIQUE: Multiplanar, multisequence MR images of  both breasts were obtained prior to and following the intravenous administration of 14ml of MultiHance.  THREE-DIMENSIONAL MR IMAGE RENDERING ON INDEPENDENT WORKSTATION:  Three-dimensional MR images were rendered by post-processing of the original MR data on an independent workstation. The three-dimensional MR images were interpreted, and findings are reported in the following complete MRI report for this study. Three dimensional images were evaluated at the independent DynaCad workstation  COMPARISON:  Recent mammogram, ultrasound and biopsy examinations.  FINDINGS: Breast composition: c:  Heterogeneous fibroglandular tissue  Background parenchymal enhancement: Minimal  Right breast: Large, irregular enhancing mass in the central right breast, involving all four quadrants, centered superiorly. This extends to the posterior aspect of the nipple without evidence of extension into the nipple. This is not involving the pectoralis muscle. This measures 7.2 x 4.5 x 3.7 cm in maximum dimensions. This contains biopsy marker clip artifacts posteriorly and laterally. There is also diffuse right breast skin thickening throughout the upper half of the breast with associated abnormal skin enhancement.  Left breast: Post lumpectomy changes with no mass or enhancement suspicious for malignancy.  Lymph nodes: Multiple abnormal appearing right axillary lymph nodes with loss of the normal fatty hila. These include level 1 nodes and a 10 mm short axis node extending  posterior to the lateral aspect of the pectoralis minor muscle on inversion recovery image number 3. There is also an abnormal appearing right internal mammary lymph node with a short axis diameter of 5 mm on image number 59 of series 13.  Ancillary findings:  None.  IMPRESSION: 1. 7.2 x 4.5 x 3.7 cm biopsy-proven invasive ductal carcinoma in in the central right breast, involving all 4 quadrants, centered superiorly. 2. Diffuse skin thickening and enhancement in the  superior half of the right breast, suspicious for dermal invasion of malignancy. 3. Level 1 and level 2 metastatic right axillary adenopathy. 4. Metastatic right internal mammary adenopathy. 5. Post lumpectomy changes on the left without evidence of recurrent malignancy in the left breast.  RECOMMENDATION: Treatment plan.  BI-RADS CATEGORY  6: Known biopsy-proven malignancy.   Electronically Signed   By: Enrique Sack M.D.   On: 12/26/2013 15:33   Nm Pet Image Initial (pi) Skull Base To Thigh  01/05/2014   CLINICAL DATA:  Initial treatment strategy for right breast carcinoma. Personal history of left breast carcinoma.  EXAM: NUCLEAR MEDICINE PET SKULL BASE TO THIGH  TECHNIQUE: 6.3 mCi F-18 FDG was injected intravenously. Full-ring PET imaging was performed from the skull base to thigh after the radiotracer. CT data was obtained and used for attenuation correction and anatomic localization.  FASTING BLOOD GLUCOSE:  Value: 104 mg/dl  COMPARISON:  12/20/2008  FINDINGS: NECK  No hypermetabolic lymph nodes in the neck.  CHEST  4 cm mass in the right breast shows hypermetabolic activity, with SUV max of 4.8. There is also diffuse hypermetabolic skin thickening involving the right breast, consistent with inflammatory breast carcinoma.  Mild hypermetabolic lymphadenopathy is seen within the right axilla, right hilum, and mediastinum in the right paratracheal region. SUV max seen in the right paratracheal region measures 8.2. No hypermetabolic lymphadenopathy seen along the internal mammary chains or left hilum.  A spiculated nodule is seen in the medial left upper lobe which measures 2.7 cm, and is having maximum SUV of 9.0. The morphology favors a primary bronchogenic carcinoma over pulmonary metastases. No other hypermetabolic pulmonary nodules are identified.  ABDOMEN/PELVIS  No abnormal hypermetabolic activity within the liver, pancreas, adrenal glands, or spleen. No hypermetabolic lymph nodes in the abdomen or pelvis.   SKELETON  Diffuse hypermetabolic bone metastases are seen throughout the skeleton.  IMPRESSION: Hypermetabolic 4 cm right breast mass and overlying skin thickening, consistent with inflammatory breast carcinoma.  2.7 cm hypermetabolic spiculated nodule in the right upper lobe, with morphology favoring a primary breast carcinoma over pulmonary metastases.  Hypermetabolic lymphadenopathy in the right axilla, right hilum, and right paratracheal region, consistent with metastatic disease.  Diffuse bone metastases.   Electronically Signed   By: Earle Gell M.D.   On: 01/05/2014 16:10   ASSESSMENT: 58 y.o.  woman with stage IV breast cancer involving brain, lung, and bones   (1) status post right breast biopsy of 2 separate masses in the right axillary lymph node 12/21/2013 showing a clinical T3 N3, stage IIIC invasive ductal carcinoma, triple negative, with an MIB-1 of 85%; staging studies 01/04/2014 show metastatic disease to the brain, lung, and bones.  (2) status post stereotactic radiosurgery to a 1.7 cm left parietal lesion: 20 Gy given 01/13/2014  (3) systemic therapy will consist of neoadjuvant eribulin, given days 1 and 8 of each 21 day cycle  (4) zolendronate to be started once the patient's dental condition has been optimized  (5) right mastectomy for local control  to be considered once systemic disease comes under good control  (6) also status post left lumpectomy and sentinel lymph node sampling 11/23/2007 for a pTis pN0, stage 0 ductal carcinoma in situ, high-grade, estrogen and progesterone receptor negative, status post adjuvant radiation  PLAN: Daily is anxious to start chemo today. We discussed at length her treatment schedule and I answered any questions the patient or her daughter had. We went over the antiemetic schedule again together.The patient has also been encouraged to continue to try to quit smoking.   After today's infusion, the plan is to move the treatment day  to Mondays, to accommodate her transportation's (daughter [name]) on her day off. Cynai and her daughter understand that the goal of treatment is control. The plan is to start the eribulin today, and see [place] back in the office prior to her day 8 infusion to see how she did. Restaging will occur after the 4th cycle is complete. Zolendronate will be added when the patient's dental condition improves.   Sydne and her daughter understand and agree with the plan. She has been encouraged to call with any issues that arise before her next visit.  Marcelino Duster, NP   01/17/2014 3:52 PM

## 2014-01-17 NOTE — Telephone Encounter (Signed)
, °

## 2014-01-17 NOTE — Progress Notes (Signed)
Patient instructions given to patient and daughter. Patient reports BP usually runs high, encouraged to follow up with primary care physician. Voices understanding. Tolerated 1st tx without difficulty.

## 2014-01-17 NOTE — Patient Instructions (Addendum)
[facility] Discharge Instructions for Patients Receiving Chemotherapy  Today you received the following chemotherapy agents: Halaven.  To help prevent nausea and vomiting after your treatment, we encourage you to take your nausea medication.   If you develop nausea and vomiting that is not controlled by your nausea medication, call the clinic.   BELOW ARE SYMPTOMS THAT SHOULD BE REPORTED IMMEDIATELY:  *FEVER GREATER THAN 100.5 F  *CHILLS WITH OR WITHOUT FEVER  NAUSEA AND VOMITING THAT IS NOT CONTROLLED WITH YOUR NAUSEA MEDICATION  *UNUSUAL SHORTNESS OF BREATH  *UNUSUAL BRUISING OR BLEEDING  TENDERNESS IN MOUTH AND THROAT WITH OR WITHOUT PRESENCE OF ULCERS  *URINARY PROBLEMS  *BOWEL PROBLEMS  UNUSUAL RASH Items with * indicate a potential emergency and should be followed up as soon as possible.  Feel free to call the clinic you have any questions or concerns. The clinic phone number is [phone].  Eribulin solution for injection What is this medicine? ERIBULIN is a chemotherapy drug. It is used to treat breast cancer. This medicine may be used for other purposes; ask your health care provider or pharmacist if you have questions. COMMON BRAND NAME(S): Halaven What should I tell my health care provider before I take this medicine? They need to know if you have any of these conditions: -heart disease -kidney disease -liver disease -low blood counts, like low white cell, platelet, or red cell counts -an unusual or allergic reaction to eribulin, other medicines, foods, dyes, or preservatives -pregnant or trying to get pregnant -breast-feeding How should I use this medicine? This medicine is for infusion into a vein. It is given by a health care professional in a hospital or clinic setting. Talk to your pediatrician regarding the use of this medicine in children. Special care may be needed. Overdosage: If you think you've taken too much of this medicine  contact a poison control center or emergency room at once. Overdosage: If you think you have taken too much of this medicine contact a poison control center or emergency room at once. NOTE: This medicine is only for you. Do not share this medicine with others. What if I miss a dose? It is important not to miss your dose. Call your doctor or health care professional if you are unable to keep an appointment. What may interact with this medicine? Do not take this medicine with any of the following medications: -amiodarone -astemizole -arsenic trioxide -bepridil -bretylium -chloroquine -chlorpromazine -cisapride -clarithromycin -dextromethorphan, quinidine -disopyramide -dofetilide -droperidol -dronedarone -erythromycin -grepafloxacin -halofantrine -haloperidol -ibutilide -levomethadyl -mesoridazine -methadone -pentamidine -procainamide -quinidine -pimozide -posaconazole -probucol -propafenone -saquinavir -sotalol -sparfloxacin -terfenadine -thioridazine -troleandomycin -ziprasidone This list may not describe all possible interactions. Give your health care provider a list of all the medicines, herbs, non-prescription drugs, or dietary supplements you use. Also tell them if you smoke, drink alcohol, or use illegal drugs. Some items may interact with your medicine. What should I watch for while using this medicine? Your condition will be monitored carefully while you are receiving this medicine. This drug may make you feel generally unwell. This is not uncommon, as chemotherapy can affect healthy cells as well as cancer cells. Report any side effects. Continue your course of treatment even though you feel ill unless your doctor tells you to stop. Call your doctor or health care professional for advice if you get a fever, chills or sore throat, or other symptoms of a cold or flu. Do not treat yourself. This drug decreases your body's ability to fight infections.  Try to avoid  being around people who are sick. This medicine may increase your risk to bruise or bleed. Call your doctor or health care professional if you notice any unusual bleeding. Be careful brushing and flossing your teeth or using a toothpick because you may get an infection or bleed more easily. If you have any dental work done, tell your dentist you are receiving this medicine. Avoid taking products that contain aspirin, acetaminophen, ibuprofen, naproxen, or ketoprofen unless instructed by your doctor. These medicines may hide a fever. Do not become pregnant while taking this medicine. Women should inform their doctor if they wish to become pregnant or think they might be pregnant. There is a potential for serious side effects to an unborn child. Talk to your health care professional or pharmacist for more information. Do not breast-feed an infant while taking this medicine. What side effects may I notice from receiving this medicine? Side effects that you should report to your doctor or health care professional as soon as possible: -allergic reactions like skin rash, itching or hives, swelling of the face, lips, or tongue -low blood counts - this medicine may decrease the number of white blood cells, red blood cells and platelets. You may be at increased risk for infections and bleeding. -signs of infection - fever or chills, cough, sore throat, pain or difficulty passing urine -signs of decreased platelets or bleeding - bruising, pinpoint red spots on the skin, black, tarry stools, blood in the urine -signs of decreased red blood cells - unusually weak or tired, fainting spells, lightheadedness -pain, tingling, numbness in the hands or feet Side effects that usually do not require medical attention (Report these to your doctor or health care professional if they continue or are bothersome.): -constipation -hair loss -headache -loss of appetite -muscle or joint pain -nausea, vomiting -stomach  pain This list may not describe all possible side effects. Call your doctor for medical advice about side effects. You may report side effects to FDA at 1-800-FDA-1088. Where should I keep my medicine? This drug is given in a hospital or clinic and will not be stored at home. NOTE: This sheet is a summary. It may not cover all possible information. If you have questions about this medicine, talk to your doctor, pharmacist, or health care provider.  2015, Elsevier/Gold Standard. (2009-05-24 23:04:37)

## 2014-01-17 NOTE — Progress Notes (Signed)
Patient's daughter will bring me proof of income for eribulin replacement

## 2014-01-18 ENCOUNTER — Encounter: Payer: Self-pay | Admitting: Nurse Practitioner

## 2014-01-18 ENCOUNTER — Telehealth: Payer: Self-pay | Admitting: *Deleted

## 2014-01-18 NOTE — Telephone Encounter (Signed)
No adverse effect from Halaven/Eribulin infusion. Ate egg sandwich for breakfast. Last BM yesterday. Voiding well. Slept well. Reviewed antiemetic regimen with her again. She will call for any questions or problems.

## 2014-01-18 NOTE — Telephone Encounter (Signed)
Per [facility] staff message scheduled appts. Advised scheduler 

## 2014-01-19 ENCOUNTER — Encounter: Payer: Self-pay | Admitting: Oncology

## 2014-01-19 NOTE — Progress Notes (Signed)
Approved for June 2015 retroactive coverage and ongoing from 12/14/13-12/14/14 Medicaid ID# [id number] T Patient has been approved for medicaid.

## 2014-01-23 ENCOUNTER — Other Ambulatory Visit: Payer: Self-pay | Admitting: Oncology

## 2014-01-23 ENCOUNTER — Other Ambulatory Visit (HOSPITAL_BASED_OUTPATIENT_CLINIC_OR_DEPARTMENT_OTHER): Payer: Medicaid Other

## 2014-01-23 ENCOUNTER — Ambulatory Visit (HOSPITAL_BASED_OUTPATIENT_CLINIC_OR_DEPARTMENT_OTHER): Payer: Medicaid Other

## 2014-01-23 ENCOUNTER — Other Ambulatory Visit: Payer: Self-pay | Admitting: *Deleted

## 2014-01-23 ENCOUNTER — Ambulatory Visit (HOSPITAL_BASED_OUTPATIENT_CLINIC_OR_DEPARTMENT_OTHER): Payer: Medicaid Other | Admitting: Nurse Practitioner

## 2014-01-23 ENCOUNTER — Ambulatory Visit: Payer: No Typology Code available for payment source | Admitting: Nurse Practitioner

## 2014-01-23 VITALS — BP 174/71 | HR 61 | Temp 98.1°F | Resp 20 | Ht 59.0 in | Wt 107.6 lb

## 2014-01-23 DIAGNOSIS — K5900 Constipation, unspecified: Secondary | ICD-10-CM

## 2014-01-23 DIAGNOSIS — C7951 Secondary malignant neoplasm of bone: Secondary | ICD-10-CM

## 2014-01-23 DIAGNOSIS — C7931 Secondary malignant neoplasm of brain: Principal | ICD-10-CM

## 2014-01-23 DIAGNOSIS — C50919 Malignant neoplasm of unspecified site of unspecified female breast: Secondary | ICD-10-CM

## 2014-01-23 DIAGNOSIS — C7800 Secondary malignant neoplasm of unspecified lung: Secondary | ICD-10-CM

## 2014-01-23 DIAGNOSIS — C7952 Secondary malignant neoplasm of bone marrow: Secondary | ICD-10-CM

## 2014-01-23 DIAGNOSIS — C50411 Malignant neoplasm of upper-outer quadrant of right female breast: Secondary | ICD-10-CM

## 2014-01-23 DIAGNOSIS — C50419 Malignant neoplasm of upper-outer quadrant of unspecified female breast: Secondary | ICD-10-CM

## 2014-01-23 DIAGNOSIS — C773 Secondary and unspecified malignant neoplasm of axilla and upper limb lymph nodes: Secondary | ICD-10-CM

## 2014-01-23 DIAGNOSIS — F17200 Nicotine dependence, unspecified, uncomplicated: Secondary | ICD-10-CM

## 2014-01-23 DIAGNOSIS — Z171 Estrogen receptor negative status [ER-]: Secondary | ICD-10-CM

## 2014-01-23 DIAGNOSIS — C7949 Secondary malignant neoplasm of other parts of nervous system: Secondary | ICD-10-CM | POA: Diagnosis not present

## 2014-01-23 DIAGNOSIS — K649 Unspecified hemorrhoids: Secondary | ICD-10-CM

## 2014-01-23 DIAGNOSIS — Z5111 Encounter for antineoplastic chemotherapy: Secondary | ICD-10-CM

## 2014-01-23 DIAGNOSIS — C50911 Malignant neoplasm of unspecified site of right female breast: Secondary | ICD-10-CM

## 2014-01-23 LAB — CBC WITH DIFFERENTIAL/PLATELET
BASO%: 0.6 % (ref 0.0–2.0)
Basophils Absolute: 0 10*3/uL (ref 0.0–0.1)
EOS%: 0 % (ref 0.0–7.0)
Eosinophils Absolute: 0 10*3/uL (ref 0.0–0.5)
HCT: 39.7 % (ref 34.8–46.6)
HGB: 13.3 g/dL (ref 11.6–15.9)
LYMPH#: 0.6 10*3/uL — AB (ref 0.9–3.3)
LYMPH%: 9.1 % — AB (ref 14.0–49.7)
MCH: 31.4 pg (ref 25.1–34.0)
MCHC: 33.5 g/dL (ref 31.5–36.0)
MCV: 93.9 fL (ref 79.5–101.0)
MONO#: 0.3 10*3/uL (ref 0.1–0.9)
MONO%: 4.6 % (ref 0.0–14.0)
NEUT#: 5.9 10*3/uL (ref 1.5–6.5)
NEUT%: 85.7 % — ABNORMAL HIGH (ref 38.4–76.8)
PLATELETS: 111 10*3/uL — AB (ref 145–400)
RBC: 4.23 10*6/uL (ref 3.70–5.45)
RDW: 13.7 % (ref 11.2–14.5)
WBC: 6.9 10*3/uL (ref 3.9–10.3)

## 2014-01-23 LAB — COMPREHENSIVE METABOLIC PANEL
ALT: 23 U/L (ref 0–35)
AST: 16 U/L (ref 0–37)
Albumin: 3.2 g/dL — ABNORMAL LOW (ref 3.5–5.2)
Alkaline Phosphatase: 72 U/L (ref 39–117)
BILIRUBIN TOTAL: 0.2 mg/dL — AB (ref 0.2–1.2)
BUN: 20 mg/dL (ref 6–23)
CO2: 25 mEq/L (ref 19–32)
Calcium: 8.6 mg/dL (ref 8.4–10.5)
Chloride: 100 mEq/L (ref 96–112)
Creatinine, Ser: 0.9 mg/dL (ref 0.50–1.10)
Glucose, Bld: 157 mg/dL — ABNORMAL HIGH (ref 70–99)
Potassium: 3.6 mEq/L (ref 3.5–5.3)
SODIUM: 140 meq/L (ref 135–145)
Total Protein: 6.2 g/dL (ref 6.0–8.3)

## 2014-01-23 MED ORDER — PANTOPRAZOLE SODIUM 40 MG PO TBEC: 40.0000 mg | DELAYED_RELEASE_TABLET | Freq: Every day | ORAL | Status: AC

## 2014-01-23 MED ORDER — SODIUM CHLORIDE 0.9 % IV SOLN
1.4000 mg/m2 | Freq: Once | INTRAVENOUS | Status: AC
  Administered 2014-01-23: 2 mg via INTRAVENOUS
  Filled 2014-01-23: qty 4

## 2014-01-23 MED ORDER — SODIUM CHLORIDE 0.9 % IV SOLN
Freq: Once | INTRAVENOUS | Status: AC
  Administered 2014-01-23: 15:00:00 via INTRAVENOUS

## 2014-01-23 MED ORDER — HYDROCORTISONE ACETATE 25 MG RE SUPP: 25.0000 mg | Freq: Two times a day (BID) | RECTAL | Status: DC

## 2014-01-23 MED ORDER — DEXAMETHASONE SODIUM PHOSPHATE 10 MG/ML IJ SOLN
10.0000 mg | Freq: Once | INTRAMUSCULAR | Status: AC
  Administered 2014-01-23: 10 mg via INTRAVENOUS

## 2014-01-23 MED ORDER — HEPARIN SOD (PORK) LOCK FLUSH 100 UNIT/ML IV SOLN
500.0000 [IU] | Freq: Once | INTRAVENOUS | Status: AC | PRN
  Administered 2014-01-23: 500 [IU]
  Filled 2014-01-23: qty 5

## 2014-01-23 MED ORDER — DEXAMETHASONE SODIUM PHOSPHATE 10 MG/ML IJ SOLN
INTRAMUSCULAR | Status: AC
  Filled 2014-01-23: qty 1

## 2014-01-23 MED ORDER — ONDANSETRON 8 MG/NS 50 ML IVPB
INTRAVENOUS | Status: AC
  Filled 2014-01-23: qty 8

## 2014-01-23 MED ORDER — ONDANSETRON 8 MG/50ML IVPB (CHCC)
8.0000 mg | Freq: Once | INTRAVENOUS | Status: AC
  Administered 2014-01-23: 8 mg via INTRAVENOUS

## 2014-01-23 MED ORDER — SODIUM CHLORIDE 0.9 % IJ SOLN
10.0000 mL | INTRAMUSCULAR | Status: DC | PRN
  Administered 2014-01-23: 10 mL
  Filled 2014-01-23: qty 10

## 2014-01-23 NOTE — Patient Instructions (Signed)
[facility] Discharge Instructions for Patients Receiving Chemotherapy  Today you received the following chemotherapy agents Halaven.  To help prevent nausea and vomiting after your treatment, we encourage you to take your nausea medication as directed.    If you develop nausea and vomiting that is not controlled by your nausea medication, call the clinic.   BELOW ARE SYMPTOMS THAT SHOULD BE REPORTED IMMEDIATELY:  *FEVER GREATER THAN 100.5 F  *CHILLS WITH OR WITHOUT FEVER  NAUSEA AND VOMITING THAT IS NOT CONTROLLED WITH YOUR NAUSEA MEDICATION  *UNUSUAL SHORTNESS OF BREATH  *UNUSUAL BRUISING OR BLEEDING  TENDERNESS IN MOUTH AND THROAT WITH OR WITHOUT PRESENCE OF ULCERS  *URINARY PROBLEMS  *BOWEL PROBLEMS  UNUSUAL RASH Items with * indicate a potential emergency and should be followed up as soon as possible.  Feel free to call the clinic you have any questions or concerns. The clinic phone number is [phone].    

## 2014-01-23 NOTE — Progress Notes (Signed)
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 The patient then had routine yearly mammography until 10/24/2009.  Sometime in 2014 the patient tells me she noted a mass in her right breast. She states she brought this to medical attention several times, although I do not see that reflected in the outside notes we have. In any case on 12/06/2013 the patient presented to her primary clinic with a complaint of left-sided breast pain. Exam showed the right breast however to be larger than the left. There was a thickened area versus lump at the 12:00 position in the right breast above the areola. There was also a mass in the right upper quadrant of the right breast. The patient was referred to the breast Center and on 6:30 at 2015 bilateral diagnostic mammography and right breast ultrasonography at the breast Center showed a 6 cm area in the right breast upper outer quadrant with increased density associated with branching calcifications. There was a separate area more medially that also appeared abnormal. There was diffuse skin thickening and interval enlarged right axillary lymph nodes. On exam the mammographer was able to palpate a 5 cm firm fixed  palpable mass in the upper-outer quadrant of the right breast, but mild skin redness. There was also a palpable right axillary lymph node. Ultrasound confirmed a large irregular hypoechoic mass at the 11:00 position of the right breast measuring 4.1 cm maximally. More laterally a separate mass in the 9:00 position measure 4.4 cm maximally. In the right axilla there were multiple abnormal appearing axillary lymph nodes.  Biopsy of both these suspicious masses in the right breast as well as one of the suspicious lymph nodes 12/21/2013 showed ([id number]) all 3 biopsies do show identical invasive ductal carcinoma, grade 3, triple negative, with an MIB-1 of 85%. On 12/26/2013 the patient underwent bilateral breast MRI. This showed an abnormal area measuring 7.2 cm in the central right breast involving or 4 quadrants and centered superiorly, with diffuse skin thickening as well as level I and 2 metastatic right axillary adenopathy. There was also an enlarged right internal mammary lymph node. The left side was unremarkable.  The patient's subsequent history is as detailed below  INTERVAL HISTORY: The patient returns today with her daughter, [name] for followup of her metastatic breast cancer. Today is day 8 cycle 1 of eribulin, given on day 1 and 8 of each 21 day cycle.   REVIEW OF SYSTEMS: [name] experienced chills a few times since her visit last week, but has been checking her temperatures and never had a fever. She is still having the intermittent, throbbing headaches but she says they are "quieter." She complains of constant tinnitus. She still has a dry cough, sinus drainage, and shortness of breath with exertion but hasn't completely quit smoking yet. She says she's quitting little by little  and is supported by her daughter. She has pain around her left axilla. She still has heartburn but doesn't have any protonix. Her last bowel movement was yesterday, and she states she was very constipated. The stool  was hard, she strained, and now her hemorrhoids have returned. A detailed review of systems was otherwise negative.  PAST MEDICAL HISTORY: Past Medical History  Diagnosis Date  . Acid reflux   . Asthma   . COPD (chronic obstructive pulmonary disease)   . Cancer     lumpectomy, radiation 2009  . Seasonal allergies   . Alcohol abuse   . Elevated d-dimer     negative chest CT  . Allergy   . Emphysema of lung   . Hot flashes   . Reading disorder   . Impaired writing skills   . Wears glasses   . Wears dentures     top  . HOH (hard of hearing)     left ear    PAST SURGICAL HISTORY: Past Surgical History  Procedure Laterality Date  . Breast lumpectomy    . Abdominal hysterectomy    . Portacath placement Left 12/30/2013    Procedure: INSERTION PORT-A-CATH;  Surgeon: Diana Roof, MD;  Location: [facility];  Service: General;  Laterality: Left;  subclavian area    FAMILY HISTORY Family History  Problem Relation Age of Onset  . Hypertension Other    the patient has little information about her father or his side of the family. The patient's mother died at the age of 74. The patient had 2 brothers and 4 sisters. There is no history of breast or ovarian cancer in the family to her knowledge.  GYNECOLOGIC HISTORY:  No LMP recorded. Patient has had a hysterectomy. The patient does not remember when she started menstruating. First live birth age 53. She is GX P1. She underwent hysterectomy approximately 12 years ago, but does not know whether she had bilateral salpingo-oophorectomy. 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 She is married but separated from her husband [name], who lives in [place]. The patient lives by herself, with no pets. Her daughter [name] works as a Stage manager for [employer] care in [place] the patient has 3 grandchildren and 2  great-grandchildren. She is a Psychologist, forensic.    ADVANCED DIRECTIVES: Not in place; at her 12/28/2013 visit the patient was given the appropriate documents to complete and notarize so she may declare a healthcare power of attorney at her discretion   HEALTH MAINTENANCE: History  Substance Use Topics  . Smoking status: Current Every Day Smoker -- 1.00 packs/day for 30 years    Types: Cigarettes  . Smokeless tobacco: Not on file  . Alcohol Use: Yes     Comment: occ beer     Colonoscopy: Never  PAP: Status post hysterectomy  Bone density: Never  Lipid panel:  Allergies  Allergen Reactions  . Penicillins Other (See Comments)    Reaction a long time ago    Current Outpatient Prescriptions  Medication Sig Dispense Refill  . albuterol (PROVENTIL HFA;VENTOLIN HFA) 108 (90 BASE) MCG/ACT inhaler Inhale 2 puffs into the lungs every 6 (six) hours as needed for wheezing or shortness of breath. For shortness of breath  3 Inhaler  3  . Aspirin-Acetaminophen-Caffeine (GOODYS EXTRA STRENGTH) [id number] MG PACK Take by mouth.      . carbamide peroxide (DEBROX) 6.5 % otic solution Place  5 drops into both ears 2 (two) times daily.  15 mL  1  . Clobetasol Prop Emollient Base 0.05 % emollient cream Apply 1 application topically 2 (two) times daily. until symptoms resolve. Use scalp applicator  30 g  5  . dexamethasone (DECADRON) 4 MG tablet 2 tabs po bid TAKE FIRST DOSE ASAP  40 tablet  3  . fluticasone (FLOVENT HFA) 220 MCG/ACT inhaler Inhale 1 puff into the lungs 2 (two) times daily.  3 Inhaler  3  . hydrocortisone (ANUSOL-HC) 25 MG suppository Place 1 suppository (25 mg total) rectally 2 (two) times daily.  12 suppository  1  . HYDROmorphone (DILAUDID) 4 MG tablet Take 1 tablet (4 mg total) by mouth every 4 (four) hours as needed for severe pain.  60 tablet  0  . hydrOXYzine (ATARAX/VISTARIL) 25 MG tablet Take 1 tablet (25 mg total) by mouth every 8 (eight) hours as needed for itching.  60 tablet  2  .  lidocaine-prilocaine (EMLA) cream Apply 1 application topically as needed. Apply over port area 1-2 hours before chemo, cover with plastic wrap  30 g  0  . loratadine (CLARITIN) 10 MG tablet Take 1 tablet (10 mg total) by mouth daily.  30 tablet  10  . LORazepam (ATIVAN) 0.5 MG tablet Take 1 tablet (0.5 mg total) by mouth every 8 (eight) hours.  30 tablet  0  . nicotine (NICODERM CQ) 21 mg/24hr patch Place 1 patch (21 mg total) onto the skin daily.  28 patch  0  . ondansetron (ZOFRAN) 8 MG tablet Take 1 tablet (8 mg total) by mouth 2 (two) times daily. Start the day after chemo for 2 days. Then take as needed for nausea or vomiting.  30 tablet  1  . oxyCODONE-acetaminophen (ROXICET) 5-325 MG per tablet Take 1-2 tablets by mouth every 4 (four) hours as needed.  50 tablet  0  . pantoprazole (PROTONIX) 40 MG tablet Take 1 tablet (40 mg total) by mouth daily.  30 tablet  2  . prochlorperazine (COMPAZINE) 10 MG tablet Take 1 tablet (10 mg total) by mouth every 6 (six) hours as needed (Nausea or vomiting).  30 tablet  1   No current facility-administered medications for this visit.   Facility-Administered Medications Ordered in Other Visits  Medication Dose Route Frequency Provider Last Rate Last Dose  . eriBULin mesylate (HALAVEN) 2 mg in sodium chloride 0.9 % 100 mL chemo infusion  1.4 mg/m2 (Treatment Plan Actual) Intravenous Once Diana Cruel, MD      . heparin lock flush 100 unit/mL  500 Units Intracatheter Once PRN Diana Cruel, MD      . ondansetron (ZOFRAN) IVPB 8 mg  8 mg Intravenous Once Diana Cruel, MD   8 mg at 01/23/14 1519  . sodium chloride 0.9 % injection 10 mL  10 mL Intracatheter PRN Diana Cruel, MD        OBJECTIVE: Middle-aged white African American woman in no acute distress Filed Vitals:   01/23/14 1350  BP: 174/71  Pulse: 61  Temp: 98.1 F (36.7 C)  Resp: 20     Body mass index is 21.72 kg/(m^2).    ECOG FS:1 - Symptomatic but completely  ambulatory  Sclerae unicteric, pupils equal and reactive Oropharynx clear and moist-- no thrush No cervical or supraclavicular adenopathy Lungs no rales or rhonchi Heart regular rate and rhythm Abd soft, nontender, positive bowel sounds MSK no focal spinal tenderness, no upper extremity lymphedema  Neuro: nonfocal, well oriented, appropriate affect Breasts: deferred   LAB RESULTS:  CMP     Component Value Date/Time   NA 140 01/23/2014 1327   NA 140 01/17/2014 1412   K 3.6 01/23/2014 1327   K 3.8 01/17/2014 1412   CL 100 01/23/2014 1327   CO2 25 01/23/2014 1327   CO2 24 01/17/2014 1412   GLUCOSE 157* 01/23/2014 1327   GLUCOSE 133 01/17/2014 1412   BUN 20 01/23/2014 1327   BUN 30.0* 01/17/2014 1412   CREATININE 0.90 01/23/2014 1327   CREATININE 1.1 01/17/2014 1412   CREATININE 0.76 10/18/2013 1438   CALCIUM 8.6 01/23/2014 1327   CALCIUM 8.3* 01/17/2014 1412   PROT 6.2 01/23/2014 1327   PROT 6.1* 01/17/2014 1412   ALBUMIN 3.2* 01/23/2014 1327   ALBUMIN 3.2* 01/17/2014 1412   AST 16 01/23/2014 1327   AST 41* 01/17/2014 1412   ALT 23 01/23/2014 1327   ALT 22 01/17/2014 1412   ALKPHOS 72 01/23/2014 1327   ALKPHOS 88 01/17/2014 1412   BILITOT 0.2* 01/23/2014 1327   BILITOT 0.25 01/17/2014 1412   GFRNONAA 87 10/18/2013 1438   GFRNONAA >90 02/25/2012 0853   GFRAA >89 10/18/2013 1438   GFRAA >90 02/25/2012 0853    I No results found for this basename: SPEP,  UPEP,   kappa and lambda light chains    Lab Results  Component Value Date   WBC 6.9 01/23/2014   NEUTROABS 5.9 01/23/2014   HGB 13.3 01/23/2014   HCT 39.7 01/23/2014   MCV 93.9 01/23/2014   PLT 111* 01/23/2014      Chemistry      Component Value Date/Time   NA 140 01/23/2014 1327   NA 140 01/17/2014 1412   K 3.6 01/23/2014 1327   K 3.8 01/17/2014 1412   CL 100 01/23/2014 1327   CO2 25 01/23/2014 1327   CO2 24 01/17/2014 1412   BUN 20 01/23/2014 1327   BUN 30.0* 01/17/2014 1412   CREATININE 0.90 01/23/2014 1327   CREATININE 1.1 01/17/2014 1412   CREATININE 0.76  10/18/2013 1438      Component Value Date/Time   CALCIUM 8.6 01/23/2014 1327   CALCIUM 8.3* 01/17/2014 1412   ALKPHOS 72 01/23/2014 1327   ALKPHOS 88 01/17/2014 1412   AST 16 01/23/2014 1327   AST 41* 01/17/2014 1412   ALT 23 01/23/2014 1327   ALT 22 01/17/2014 1412   BILITOT 0.2* 01/23/2014 1327   BILITOT 0.25 01/17/2014 1412       No results found for this basename: LABCA2    No components found with this basename: LABCA125    No results found for this basename: INR,  in the last 168 hours  Urinalysis    Component Value Date/Time   COLORURINE YELLOW 12/03/2012 [place] 12/03/2012 1601   LABSPEC 1.018 12/03/2012 1601   PHURINE 6.0 12/03/2012 1601   GLUCOSEU NEG 12/03/2012 1601   GLUCOSEU NEG mg/dL 07/09/2007 2022   HGBUR NEG 12/03/2012 1601   BILIRUBINUR NEG 12/03/2012 1601   KETONESUR NEG 12/03/2012 1601   PROTEINUR NEG 12/03/2012 1601   UROBILINOGEN 0.2 12/03/2012 1601   NITRITE NEG 12/03/2012 1601   LEUKOCYTESUR SMALL* 12/03/2012 1601    STUDIES:   ASSESSMENT: 58 y.o. [place] woman with stage IV breast cancer involving brain, lung, and bones   (1) status post right breast biopsy of 2 separate masses in the right axillary lymph node 12/21/2013 showing a clinical T3 N3, stage IIIC  invasive ductal carcinoma, triple negative, with an MIB-1 of 85%; staging studies 01/04/2014 show metastatic disease to the brain, lung, and bones.  (2) status post stereotactic radiosurgery to a 1.7 cm left parietal lesion: 20 Gy given 01/13/2014  (3) systemic therapy will consist of neoadjuvant eribulin, given days 1 and 8 of each 21 day cycle  (4) zolendronate to be started once the patient's dental condition has been optimized  (5) right mastectomy for local control to be considered once systemic disease comes under good control  (6) also status post left lumpectomy and sentinel lymph node sampling 11/23/2007 for a pTis pN0, stage 0 ductal carcinoma in situ, high-grade, estrogen and  progesterone receptor negative, status post adjuvant radiation  PLAN: Steffany tolerated chemo well last week. She denies nausea or vomiting, but still complains of heartburn. [name], the nurse, discovered she hadn't been taking her protonix so this was reordered at her pharmacy. For her constipation, regular use of miralax and colace were encouraged. A prescription for Anusol HC suppositories were sent to her pharmacy as well to treat her hemorrhoids. She was advised not to strain or sit for too long on the toilet. The patient claims she has been trying to quit smoking, and will impress [place] next visit by how well she has done. Her daughter agrees to help her with this.   We will proceed with day 8, cycle 1 today. Mittie will see [place] back in the office on day 1 of cycle 2 in 2 weeks. Restaging will occur after the 4th cycle is completed. Zolendronate will be added when the patient's dental condition improves.   Kendrah and her daughter understand and agree with the plan. She knows that the treatment goal in her case is control. She has been encouraged to call with any issues that arise before her next visit.  Diana Duster, NP   01/23/2014 3:33 PM

## 2014-01-24 ENCOUNTER — Ambulatory Visit: Payer: No Typology Code available for payment source

## 2014-01-24 ENCOUNTER — Other Ambulatory Visit: Payer: No Typology Code available for payment source

## 2014-01-24 ENCOUNTER — Ambulatory Visit: Payer: No Typology Code available for payment source | Admitting: Physician Assistant

## 2014-01-24 LAB — CANCER ANTIGEN 27.29: CA 27.29: 1087 U/mL — AB (ref 0–39)

## 2014-01-29 ENCOUNTER — Emergency Department (HOSPITAL_COMMUNITY): Payer: Medicaid Other

## 2014-01-29 ENCOUNTER — Emergency Department (HOSPITAL_COMMUNITY)
Admission: EM | Admit: 2014-01-29 | Discharge: 2014-01-29 | Disposition: A | Discharge status: Home / Self Care | Payer: Medicaid Other | Attending: Emergency Medicine | Admitting: Emergency Medicine

## 2014-01-29 ENCOUNTER — Encounter (HOSPITAL_COMMUNITY): Payer: Self-pay | Admitting: Emergency Medicine

## 2014-01-29 DIAGNOSIS — Z859 Personal history of malignant neoplasm, unspecified: Secondary | ICD-10-CM | POA: Diagnosis not present

## 2014-01-29 DIAGNOSIS — J438 Other emphysema: Secondary | ICD-10-CM | POA: Insufficient documentation

## 2014-01-29 DIAGNOSIS — IMO0002 Reserved for concepts with insufficient information to code with codable children: Secondary | ICD-10-CM | POA: Diagnosis not present

## 2014-01-29 DIAGNOSIS — Z79899 Other long term (current) drug therapy: Secondary | ICD-10-CM | POA: Insufficient documentation

## 2014-01-29 DIAGNOSIS — G4489 Other headache syndrome: Secondary | ICD-10-CM | POA: Diagnosis not present

## 2014-01-29 DIAGNOSIS — R51 Headache: Secondary | ICD-10-CM | POA: Insufficient documentation

## 2014-01-29 DIAGNOSIS — F17200 Nicotine dependence, unspecified, uncomplicated: Secondary | ICD-10-CM | POA: Diagnosis not present

## 2014-01-29 DIAGNOSIS — Z923 Personal history of irradiation: Secondary | ICD-10-CM | POA: Insufficient documentation

## 2014-01-29 DIAGNOSIS — K219 Gastro-esophageal reflux disease without esophagitis: Secondary | ICD-10-CM | POA: Insufficient documentation

## 2014-01-29 DIAGNOSIS — Z7982 Long term (current) use of aspirin: Secondary | ICD-10-CM | POA: Diagnosis not present

## 2014-01-29 LAB — CBC WITH DIFFERENTIAL/PLATELET
BASOS PCT: 1 % (ref 0–1)
Basophils Absolute: 0 10*3/uL (ref 0.0–0.1)
EOS ABS: 0 10*3/uL (ref 0.0–0.7)
Eosinophils Relative: 0 % (ref 0–5)
HCT: 32.1 % — ABNORMAL LOW (ref 36.0–46.0)
HEMOGLOBIN: 11.3 g/dL — AB (ref 12.0–15.0)
LYMPHS ABS: 2.1 10*3/uL (ref 0.7–4.0)
Lymphocytes Relative: 50 % — ABNORMAL HIGH (ref 12–46)
MCH: 32.1 pg (ref 26.0–34.0)
MCHC: 35.2 g/dL (ref 30.0–36.0)
MCV: 91.2 fL (ref 78.0–100.0)
MONOS PCT: 2 % — AB (ref 3–12)
Monocytes Absolute: 0.1 10*3/uL (ref 0.1–1.0)
Neutro Abs: 2 10*3/uL (ref 1.7–7.7)
Neutrophils Relative %: 47 % (ref 43–77)
PLATELETS: 123 10*3/uL — AB (ref 150–400)
RBC: 3.52 MIL/uL — ABNORMAL LOW (ref 3.87–5.11)
RDW: 13.6 % (ref 11.5–15.5)
WBC: 4.1 10*3/uL (ref 4.0–10.5)

## 2014-01-29 LAB — BASIC METABOLIC PANEL
Anion gap: 14 (ref 5–15)
BUN: 10 mg/dL (ref 6–23)
CALCIUM: 7.8 mg/dL — AB (ref 8.4–10.5)
CO2: 25 mEq/L (ref 19–32)
Chloride: 102 mEq/L (ref 96–112)
Creatinine, Ser: 0.71 mg/dL (ref 0.50–1.10)
GFR calc Af Amer: >90 mL/min (ref >90)
GLUCOSE: 87 mg/dL (ref 70–99)
Potassium: 3 mEq/L — ABNORMAL LOW (ref 3.7–5.3)
Sodium: 141 mEq/L (ref 137–147)

## 2014-01-29 LAB — URINALYSIS, ROUTINE W REFLEX MICROSCOPIC
BILIRUBIN URINE: NEGATIVE
GLUCOSE, UA: NEGATIVE mg/dL
KETONES UR: NEGATIVE mg/dL
Nitrite: NEGATIVE
Protein, ur: NEGATIVE mg/dL
Specific Gravity, Urine: 1.007 (ref 1.005–1.030)
UROBILINOGEN UA: 1 mg/dL (ref 0.0–1.0)
pH: 7 (ref 5.0–8.0)

## 2014-01-29 LAB — URINE MICROSCOPIC-ADD ON

## 2014-01-29 MED ORDER — HYDROMORPHONE HCL PF 1 MG/ML IJ SOLN
0.5000 mg | Freq: Once | INTRAMUSCULAR | Status: AC
  Administered 2014-01-29: 0.5 mg via INTRAVENOUS
  Filled 2014-01-29: qty 1

## 2014-01-29 MED ORDER — METOCLOPRAMIDE HCL 5 MG/ML IJ SOLN
10.0000 mg | Freq: Once | INTRAMUSCULAR | Status: AC
  Administered 2014-01-29: 10 mg via INTRAVENOUS
  Filled 2014-01-29: qty 2

## 2014-01-29 MED ORDER — SODIUM CHLORIDE 0.9 % IV SOLN
Freq: Once | INTRAVENOUS | Status: AC
  Administered 2014-01-29: 11:00:00 via INTRAVENOUS

## 2014-01-29 MED ORDER — KETOROLAC TROMETHAMINE 30 MG/ML IJ SOLN
30.0000 mg | Freq: Once | INTRAMUSCULAR | Status: AC
  Administered 2014-01-29: 30 mg via INTRAVENOUS
  Filled 2014-01-29: qty 1

## 2014-01-29 MED ORDER — DIPHENHYDRAMINE HCL 50 MG/ML IJ SOLN
25.0000 mg | Freq: Once | INTRAMUSCULAR | Status: AC
  Administered 2014-01-29: 25 mg via INTRAVENOUS
  Filled 2014-01-29: qty 1

## 2014-01-29 MED ORDER — HEPARIN SOD (PORK) LOCK FLUSH 100 UNIT/ML IV SOLN
500.0000 [IU] | Freq: Once | INTRAVENOUS | Status: AC
  Administered 2014-01-29: 500 [IU]
  Filled 2014-01-29: qty 5

## 2014-01-29 MED ORDER — POTASSIUM CHLORIDE CRYS ER 20 MEQ PO TBCR
40.0000 meq | EXTENDED_RELEASE_TABLET | Freq: Once | ORAL | Status: AC
  Administered 2014-01-29: 40 meq via ORAL
  Filled 2014-01-29: qty 2

## 2014-01-29 MED ORDER — BUTALBITAL-APAP-CAFFEINE 50-325-40 MG PO TABS: 1.0000 | ORAL_TABLET | Freq: Four times a day (QID) | ORAL | Status: AC | PRN

## 2014-01-29 MED ORDER — DEXAMETHASONE SODIUM PHOSPHATE 10 MG/ML IJ SOLN
10.0000 mg | Freq: Once | INTRAMUSCULAR | Status: AC
  Administered 2014-01-29: 10 mg via INTRAVENOUS
  Filled 2014-01-29: qty 1

## 2014-01-29 MED ORDER — SODIUM CHLORIDE 0.9 % IV BOLUS (SEPSIS)
1000.0000 mL | Freq: Once | INTRAVENOUS | Status: AC
  Administered 2014-01-29: 1000 mL via INTRAVENOUS

## 2014-01-29 NOTE — Discharge Instructions (Signed)

## 2014-01-29 NOTE — ED Notes (Signed)
Pt has head pain that started last night. Pt has stage 4 breast cancer which has metastized to lung, head, bone.

## 2014-01-29 NOTE — ED Provider Notes (Signed)
TIME SEEN: 9:18 AM  CHIEF COMPLAINT: Headache  HPI: Patient is a 58 year old female with history of COPD, continued tobacco use, metastatic breast cancer that has metastasized to bone and brain with last chemotherapy on 01/23/14 and had palliative brain radiation on 01/13/14 who presents emergency department with right-sided, achy headache that started at 2 AM. She states that she woke up from sleep to use the bathroom and she had a headache. It did not wake her from sleep. She states she has had headaches similar to this before but states this is more severe. She took her oxycodone at home without relief. Denies any head injury. No eye pain or blurry vision. She does have photophobia and phonophobia. No numbness, tingling or focal weakness. No fever, neck pain or neck stiffness. She is not on anticoagulation. No thunderclap headache.  ROS: See HPI Constitutional: no fever  Eyes: no drainage  ENT: no runny nose   Cardiovascular:  no chest pain  Resp: no SOB  GI: no vomiting GU: no dysuria Integumentary: no rash  Allergy: no hives  Musculoskeletal: no leg swelling  Neurological: no slurred speech ROS otherwise negative  PAST MEDICAL HISTORY/PAST SURGICAL HISTORY:  Past Medical History  Diagnosis Date  . Acid reflux   . Asthma   . COPD (chronic obstructive pulmonary disease)   . Cancer     lumpectomy, radiation 2009  . Seasonal allergies   . Alcohol abuse   . Elevated d-dimer     negative chest CT  . Allergy   . Emphysema of lung   . Hot flashes   . Reading disorder   . Impaired writing skills   . Wears glasses   . Wears dentures     top  . HOH (hard of hearing)     left ear    MEDICATIONS:  Prior to Admission medications   Medication Sig Start Date End Date Taking? Authorizing Provider  lidocaine-prilocaine (EMLA) cream Apply 1 application topically as needed. Apply over port area 1-2 hours before chemo, cover with plastic wrap 01/14/14  Yes Chauncey Cruel, MD   prochlorperazine (COMPAZINE) 10 MG tablet Take 1 tablet (10 mg total) by mouth every 6 (six) hours as needed (Nausea or vomiting). 01/16/14  Yes Chauncey Cruel, MD  albuterol (PROVENTIL HFA;VENTOLIN HFA) 108 (90 BASE) MCG/ACT inhaler Inhale 2 puffs into the lungs every 6 (six) hours as needed for wheezing or shortness of breath. For shortness of breath 07/25/13   Angelica Chessman, MD  Aspirin-Acetaminophen-Caffeine (GOODYS EXTRA STRENGTH) [phone] MG PACK Take by mouth.    Historical Provider, MD  carbamide peroxide (DEBROX) 6.5 % otic solution Place 5 drops into both ears 2 (two) times daily. 10/18/13   Lorayne Marek, MD  Clobetasol Prop Emollient Base 0.05 % emollient cream Apply 1 application topically 2 (two) times daily. until symptoms resolve. Use scalp applicator 09/22/68   Reyne Dumas, MD  dexamethasone (DECADRON) 4 MG tablet 2 tabs po bid TAKE FIRST DOSE ASAP 01/16/14   Chauncey Cruel, MD  fluticasone (FLOVENT HFA) 220 MCG/ACT inhaler Inhale 1 puff into the lungs 2 (two) times daily. 07/25/13   Angelica Chessman, MD  hydrocortisone (ANUSOL-HC) 25 MG suppository Place 1 suppository (25 mg total) rectally 2 (two) times daily. 01/23/14   Marcelino Duster, NP  HYDROmorphone (DILAUDID) 4 MG tablet Take 1 tablet (4 mg total) by mouth every 4 (four) hours as needed for severe pain. 01/05/14   Marye Round, MD  hydrOXYzine (ATARAX/VISTARIL) 25 MG  tablet Take 1 tablet (25 mg total) by mouth every 8 (eight) hours as needed for itching. 09/15/13   Reyne Dumas, MD  loratadine (CLARITIN) 10 MG tablet Take 1 tablet (10 mg total) by mouth daily. 11/03/13   Lorayne Marek, MD  LORazepam (ATIVAN) 0.5 MG tablet Take 1 tablet (0.5 mg total) by mouth every 8 (eight) hours. 01/16/14   Chauncey Cruel, MD  nicotine (NICODERM CQ) 21 mg/24hr patch Place 1 patch (21 mg total) onto the skin daily. 10/18/13   Lorayne Marek, MD  ondansetron (ZOFRAN) 8 MG tablet Take 1 tablet (8 mg total) by mouth 2 (two) times daily. Start  the day after chemo for 2 days. Then take as needed for nausea or vomiting. 01/16/14   Chauncey Cruel, MD  oxyCODONE-acetaminophen (ROXICET) 5-325 MG per tablet Take 1-2 tablets by mouth every 4 (four) hours as needed. 01/13/14   Chauncey Cruel, MD  pantoprazole (PROTONIX) 40 MG tablet Take 1 tablet (40 mg total) by mouth daily. 01/23/14   Marcelino Duster, NP    ALLERGIES:  Allergies  Allergen Reactions  . Penicillins Other (See Comments)    Reaction a long time ago    SOCIAL HISTORY:  History  Substance Use Topics  . Smoking status: Current Every Day Smoker -- 1.00 packs/day for 30 years    Types: Cigarettes  . Smokeless tobacco: Not on file  . Alcohol Use: Yes     Comment: occ beer    FAMILY HISTORY: Family History  Problem Relation Age of Onset  . Hypertension Other     EXAM: BP 153/82  Pulse 91  Temp(Src) 97.9 F (36.6 C) (Oral)  Resp 22  SpO2 100% CONSTITUTIONAL: Alert and oriented and responds appropriately to questions. Well-appearing; well-nourished, patient appears uncomfortable HEAD: Normocephalic EYES: Conjunctivae clear, PERRL, patient has photophobia ENT: normal nose; no rhinorrhea; moist mucous membranes; pharynx without lesions noted NECK: Supple, no meningismus, no LAD  CARD: RRR; S1 and S2 appreciated; no murmurs, no clicks, no rubs, no gallops RESP: Normal chest excursion without splinting or tachypnea; breath sounds clear and equal bilaterally; no wheezes, no rhonchi, no rales,  ABD/GI: Normal bowel sounds; non-distended; soft, non-tender, no rebound, no guarding BACK:  The back appears normal and is non-tender to palpation, there is no CVA tenderness EXT: Normal ROM in all joints; non-tender to palpation; no edema; normal capillary refill; no cyanosis    SKIN: Normal color for age and race; warm NEURO: Moves all extremities equally, sensation to light touch intact diffusely, cranial nerves II through XII intact PSYCH: The patient's mood and  manner are appropriate. Grooming and personal hygiene are appropriate.  MEDICAL DECISION MAKING: Patient here with headache. Concern for possible worsening of her metastases versus intracranial hemorrhage versus less likely infarct given she is neurologically intact. Denies a history of chronic headaches or migraines. We'll obtain labs, give pain medication, obtain a head CT.  ED PROGRESS: Labs are unremarkable. No leukocytosis. She does have mild hypokalemia. We'll replace. CT head shows stable left parietal vasogenic edema within an intra-axial mass. There is no new mass effect, shift or hemorrhage. She is feeling much better after Dilaudid. We'll give Toradol, Reglan, Decadron and reassess.   12:07 PM  Pt reports her headache is completely gone after Toradol, Reglan and Decadron. Suspect she may have had a migraine headache. I feel she is safe to be discharged home. We'll discharge with prescription for Fioricet to use as needed. Discussed return precautions and supportive  care instructions. She verbalized understanding and is comfortable with plan.  [place], DO 01/29/14 1207

## 2014-01-29 NOTE — ED Notes (Signed)
Patient transported to CT 

## 2014-02-06 ENCOUNTER — Telehealth: Payer: Self-pay | Admitting: Nurse Practitioner

## 2014-02-06 ENCOUNTER — Telehealth: Payer: Self-pay | Admitting: *Deleted

## 2014-02-06 ENCOUNTER — Encounter: Payer: Self-pay | Admitting: Nurse Practitioner

## 2014-02-06 ENCOUNTER — Ambulatory Visit (HOSPITAL_BASED_OUTPATIENT_CLINIC_OR_DEPARTMENT_OTHER): Payer: Medicaid Other

## 2014-02-06 ENCOUNTER — Other Ambulatory Visit (HOSPITAL_BASED_OUTPATIENT_CLINIC_OR_DEPARTMENT_OTHER): Payer: Medicaid Other

## 2014-02-06 ENCOUNTER — Ambulatory Visit (HOSPITAL_BASED_OUTPATIENT_CLINIC_OR_DEPARTMENT_OTHER): Payer: Medicaid Other | Admitting: Nurse Practitioner

## 2014-02-06 ENCOUNTER — Other Ambulatory Visit: Payer: Self-pay | Admitting: *Deleted

## 2014-02-06 ENCOUNTER — Other Ambulatory Visit: Payer: Self-pay | Admitting: Oncology

## 2014-02-06 VITALS — BP 112/91 | HR 98 | Temp 98.5°F | Resp 18 | Ht 59.0 in | Wt 109.7 lb

## 2014-02-06 DIAGNOSIS — C50919 Malignant neoplasm of unspecified site of unspecified female breast: Secondary | ICD-10-CM

## 2014-02-06 DIAGNOSIS — N898 Other specified noninflammatory disorders of vagina: Secondary | ICD-10-CM | POA: Insufficient documentation

## 2014-02-06 DIAGNOSIS — C7952 Secondary malignant neoplasm of bone marrow: Secondary | ICD-10-CM

## 2014-02-06 DIAGNOSIS — C773 Secondary and unspecified malignant neoplasm of axilla and upper limb lymph nodes: Secondary | ICD-10-CM

## 2014-02-06 DIAGNOSIS — T451X5A Adverse effect of antineoplastic and immunosuppressive drugs, initial encounter: Secondary | ICD-10-CM | POA: Insufficient documentation

## 2014-02-06 DIAGNOSIS — C7800 Secondary malignant neoplasm of unspecified lung: Secondary | ICD-10-CM

## 2014-02-06 DIAGNOSIS — C7931 Secondary malignant neoplasm of brain: Secondary | ICD-10-CM

## 2014-02-06 DIAGNOSIS — C50911 Malignant neoplasm of unspecified site of right female breast: Secondary | ICD-10-CM

## 2014-02-06 DIAGNOSIS — F17200 Nicotine dependence, unspecified, uncomplicated: Secondary | ICD-10-CM

## 2014-02-06 DIAGNOSIS — Z136 Encounter for screening for cardiovascular disorders: Secondary | ICD-10-CM

## 2014-02-06 DIAGNOSIS — C50419 Malignant neoplasm of upper-outer quadrant of unspecified female breast: Secondary | ICD-10-CM

## 2014-02-06 DIAGNOSIS — N899 Noninflammatory disorder of vagina, unspecified: Secondary | ICD-10-CM

## 2014-02-06 DIAGNOSIS — C7949 Secondary malignant neoplasm of other parts of nervous system: Secondary | ICD-10-CM

## 2014-02-06 DIAGNOSIS — K1379 Other lesions of oral mucosa: Secondary | ICD-10-CM | POA: Insufficient documentation

## 2014-02-06 DIAGNOSIS — C7951 Secondary malignant neoplasm of bone: Secondary | ICD-10-CM

## 2014-02-06 DIAGNOSIS — C50411 Malignant neoplasm of upper-outer quadrant of right female breast: Secondary | ICD-10-CM

## 2014-02-06 LAB — COMPREHENSIVE METABOLIC PANEL (CC13)
ALT: 8 U/L (ref 0–55)
ANION GAP: 8 meq/L (ref 3–11)
AST: 10 U/L (ref 5–34)
Albumin: 2.5 g/dL — ABNORMAL LOW (ref 3.5–5.0)
Alkaline Phosphatase: 102 U/L (ref 40–150)
BUN: 8.5 mg/dL (ref 7.0–26.0)
CALCIUM: 8.4 mg/dL (ref 8.4–10.4)
CO2: 23 meq/L (ref 22–29)
CREATININE: 0.6 mg/dL (ref 0.6–1.1)
Chloride: 110 mEq/L — ABNORMAL HIGH (ref 98–109)
GLUCOSE: 100 mg/dL (ref 70–140)
Potassium: 4 mEq/L (ref 3.5–5.1)
Sodium: 141 mEq/L (ref 136–145)
TOTAL PROTEIN: 6 g/dL — AB (ref 6.4–8.3)
Total Bilirubin: <0.2 mg/dL (ref 0.20–1.20)

## 2014-02-06 LAB — CBC WITH DIFFERENTIAL/PLATELET
BASO%: 0.6 % (ref 0.0–2.0)
BASOS ABS: 0 10*3/uL (ref 0.0–0.1)
EOS ABS: 0 10*3/uL (ref 0.0–0.5)
EOS%: 0.6 % (ref 0.0–7.0)
HCT: 30 % — ABNORMAL LOW (ref 34.8–46.6)
HEMOGLOBIN: 9.8 g/dL — AB (ref 11.6–15.9)
LYMPH#: 1.8 10*3/uL (ref 0.9–3.3)
LYMPH%: 26.3 % (ref 14.0–49.7)
MCH: 31.9 pg (ref 25.1–34.0)
MCHC: 32.7 g/dL (ref 31.5–36.0)
MCV: 97.4 fL (ref 79.5–101.0)
MONO#: 0.3 10*3/uL (ref 0.1–0.9)
MONO%: 4 % (ref 0.0–14.0)
NEUT%: 68.5 % (ref 38.4–76.8)
NEUTROS ABS: 4.8 10*3/uL (ref 1.5–6.5)
PLATELETS: 127 10*3/uL — AB (ref 145–400)
RBC: 3.08 10*6/uL — ABNORMAL LOW (ref 3.70–5.45)
RDW: 14.7 % — ABNORMAL HIGH (ref 11.2–14.5)
WBC: 7 10*3/uL (ref 3.9–10.3)

## 2014-02-06 LAB — TECHNOLOGIST REVIEW

## 2014-02-06 MED ORDER — LORAZEPAM 0.5 MG PO TABS
0.5000 mg | ORAL_TABLET | Freq: Three times a day (TID) | ORAL | Status: DC
Start: 2014-02-06 — End: 2014-03-06

## 2014-02-06 MED ORDER — SODIUM CHLORIDE 0.9 % IV SOLN
1.4000 mg/m2 | Freq: Once | INTRAVENOUS | Status: AC
  Administered 2014-02-06: 2 mg via INTRAVENOUS
  Filled 2014-02-06: qty 4

## 2014-02-06 MED ORDER — SODIUM CHLORIDE 0.9 % IV SOLN
Freq: Once | INTRAVENOUS | Status: AC
  Administered 2014-02-06: 15:00:00 via INTRAVENOUS

## 2014-02-06 MED ORDER — FIRST-DUKES MOUTHWASH MT SUSP: 5.0000 mL | Freq: Four times a day (QID) | OROMUCOSAL | Status: DC

## 2014-02-06 MED ORDER — DEXAMETHASONE SODIUM PHOSPHATE 10 MG/ML IJ SOLN
INTRAMUSCULAR | Status: AC
  Filled 2014-02-06: qty 1

## 2014-02-06 MED ORDER — HEPARIN SOD (PORK) LOCK FLUSH 100 UNIT/ML IV SOLN
500.0000 [IU] | Freq: Once | INTRAVENOUS | Status: AC | PRN
  Administered 2014-02-06: 500 [IU]
  Filled 2014-02-06: qty 5

## 2014-02-06 MED ORDER — FLUCONAZOLE 150 MG PO TABS: 150.0000 mg | ORAL_TABLET | Freq: Once | ORAL | Status: DC

## 2014-02-06 MED ORDER — SODIUM CHLORIDE 0.9 % IJ SOLN
10.0000 mL | INTRAMUSCULAR | Status: DC | PRN
  Administered 2014-02-06: 10 mL
  Filled 2014-02-06: qty 10

## 2014-02-06 MED ORDER — ONDANSETRON 8 MG/50ML IVPB (CHCC)
8.0000 mg | Freq: Once | INTRAVENOUS | Status: AC
  Administered 2014-02-06: 8 mg via INTRAVENOUS

## 2014-02-06 MED ORDER — ONDANSETRON 8 MG/NS 50 ML IVPB
INTRAVENOUS | Status: AC
  Filled 2014-02-06: qty 8

## 2014-02-06 MED ORDER — DEXAMETHASONE SODIUM PHOSPHATE 10 MG/ML IJ SOLN
10.0000 mg | Freq: Once | INTRAMUSCULAR | Status: AC
  Administered 2014-02-06: 10 mg via INTRAVENOUS

## 2014-02-06 NOTE — Progress Notes (Signed)
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 The patient then had routine yearly mammography until 10/24/2009.  Sometime in 2014 the patient tells me she noted a mass in her right breast. She states she brought this to medical attention several times, although I do not see that reflected in the outside notes we have. In any case on 12/06/2013 the patient presented to her primary clinic with a complaint of left-sided breast pain. Exam showed the right breast however to be larger than the left. There was a thickened area versus lump at the 12:00 position in the right breast above the areola. There was also a mass in the right upper quadrant of the right breast. The patient was referred to the breast Center and on 6:30 at 2015 bilateral diagnostic mammography and right breast ultrasonography at the breast Center showed a 6 cm area in the right breast upper outer quadrant with increased density associated with branching calcifications. There was a separate area more medially that also appeared abnormal. There was diffuse skin thickening and interval enlarged right axillary lymph nodes. On exam the mammographer was able to palpate a 5 cm firm fixed  palpable mass in the upper-outer quadrant of the right breast, but mild skin redness. There was also a palpable right axillary lymph node. Ultrasound confirmed a large irregular hypoechoic mass at the 11:00 position of the right breast measuring 4.1 cm maximally. More laterally a separate mass in the 9:00 position measure 4.4 cm maximally. In the right axilla there were multiple abnormal appearing axillary lymph nodes.  Biopsy of both these suspicious masses in the right breast as well as one of the suspicious lymph nodes 12/21/2013 showed ([id number]) all 3 biopsies do show identical invasive ductal carcinoma, grade 3, triple negative, with an MIB-1 of 85%. On 12/26/2013 the patient underwent bilateral breast MRI. This showed an abnormal area measuring 7.2 cm in the central right breast involving or 4 quadrants and centered superiorly, with diffuse skin thickening as well as level I and 2 metastatic right axillary adenopathy. There was also an enlarged right internal mammary lymph node. The left side was unremarkable.  The patient's subsequent history is as detailed below  INTERVAL HISTORY: The patient returns today with her daughter, [name] for followup of her metastatic breast cancer. Today is day 1 cycle 2 of eribulin, given on day 1 and 8 of each 21 day cycle. Since her last visit, her hemorrhoids have resolved. She had better more frequent bowel movements now that she regularly takes miralax. She visited the ED last week for photophobic headaches, but has been prescribed Fioricet which helps. She notices her vision is starting to change and is more blurry. She has cut her smoking down to 2 cigarettes a day which is impressive. Most recently she has intense vaginal itching. She can't recall  any discharge that she's noticed, and denies any odors. She also complains of isolated mouth sores.  REVIEW OF SYSTEMS: Brizeyda denies fevers, chills, nausea, vomiting, or peripheral neuropathy. She still has pain  around her left axilla, but she's noticed her breast swelling has decreased. A detailed review of systems was otherwise negative, except as noted above.   PAST MEDICAL HISTORY: Past Medical History  Diagnosis Date  . Acid reflux   . Asthma   . COPD (chronic obstructive pulmonary disease)   . Cancer     lumpectomy, radiation 2009  . Seasonal allergies   . Alcohol abuse   . Elevated d-dimer     negative chest CT  . Allergy   . Emphysema of lung   . Hot flashes   . Reading disorder   . Impaired writing skills   . Wears glasses   . Wears dentures     top  . HOH (hard of hearing)     left ear    PAST SURGICAL HISTORY: Past Surgical History  Procedure Laterality Date  . Breast lumpectomy    . Abdominal hysterectomy    . Portacath placement Left 12/30/2013    Procedure: INSERTION PORT-A-CATH;  Surgeon: Merrie Roof, MD;  Location: [facility];  Service: General;  Laterality: Left;  subclavian area    FAMILY HISTORY Family History  Problem Relation Age of Onset  . Hypertension Other    the patient has little information about her father or his side of the family. The patient's mother died at the age of 4. The patient had 2 brothers and 4 sisters. There is no history of breast or ovarian cancer in the family to her knowledge.  GYNECOLOGIC HISTORY:  No LMP recorded. Patient has had a hysterectomy. The patient does not remember when she started menstruating. First live birth age 34. She is GX P1. She underwent hysterectomy approximately 12 years ago, but does not know whether she had bilateral salpingo-oophorectomy. 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 She is married but separated from her husband Oluwakemi Salsberry, who lives in [place]. The patient lives by herself, with no pets. Her daughter [name] works as a Stage manager for [employer] care in [place] the  patient has 3 grandchildren and 2 great-grandchildren. She is a Psychologist, forensic.    ADVANCED DIRECTIVES: Not in place; at her 12/28/2013 visit the patient was given the appropriate documents to complete and notarize so she may declare a healthcare power of attorney at her discretion   HEALTH MAINTENANCE: History  Substance Use Topics  . Smoking status: Current Every Day Smoker -- 1.00 packs/day for 30 years    Types: Cigarettes  . Smokeless tobacco: Not on file  . Alcohol Use: Yes     Comment: occ beer     Colonoscopy: Never  PAP: Status post hysterectomy  Bone density: Never  Lipid panel:  Allergies  Allergen Reactions  . Penicillins Other (See Comments)    Reaction a long time ago    Current Outpatient Prescriptions  Medication Sig Dispense Refill  . albuterol (PROVENTIL HFA;VENTOLIN HFA) 108 (90 BASE) MCG/ACT inhaler Inhale 2 puffs into the lungs every 6 (six) hours as needed for wheezing or shortness of breath. For shortness of breath  3 Inhaler  3  . carbamide peroxide (DEBROX) 6.5 % otic solution Place 5 drops into both ears 2 (two) times daily.  15 mL  1  . dexamethasone (DECADRON) 4 MG tablet Take 4 mg by mouth 2 (two) times daily.      . fluticasone (FLOVENT HFA) 220 MCG/ACT inhaler Inhale 1 puff into the lungs 2 (two) times daily.  3 Inhaler  3  . hydrocortisone (ANUSOL-HC) 25 MG suppository Place 1 suppository (25 mg total) rectally 2 (two) times daily.  12 suppository  1  . lidocaine-prilocaine (EMLA) cream Apply 1 application topically as needed. Apply over port area 1-2 hours before chemo, cover with plastic wrap  30 g  0  . ondansetron (ZOFRAN) 8 MG tablet Take 1 tablet (8 mg total) by mouth 2 (two) times daily. Start the day after chemo for 2 days. Then take as needed for nausea or vomiting.  30 tablet  1  . oxyCODONE-acetaminophen (ROXICET) 5-325 MG per tablet Take 1-2 tablets by mouth every 4 (four) hours as needed.  50 tablet  0  . pantoprazole (PROTONIX) 40 MG tablet  Take 1 tablet (40 mg total) by mouth daily.  30 tablet  2  . prochlorperazine (COMPAZINE) 10 MG tablet Take 1 tablet (10 mg total) by mouth every 6 (six) hours as needed (Nausea or vomiting).  30 tablet  1  . Aspirin-Acetaminophen-Caffeine (GOODYS EXTRA STRENGTH) [phone] MG PACK Take by mouth.      . butalbital-acetaminophen-caffeine (FIORICET) 50-325-40 MG per tablet Take 1-2 tablets by mouth every 6 (six) hours as needed for headache.  20 tablet  0  . Diphenhyd-Hydrocort-Nystatin (FIRST-DUKES MOUTHWASH) SUSP Use as directed 5 mLs in the mouth or throat 4 (four) times daily. Swish, gargle and spit one to two teaspoonfuls every six hours as needed. May be swallowed if esophageal involvement.  120 mL  1  . LORazepam (ATIVAN) 0.5 MG tablet Take 1 tablet (0.5 mg total) by mouth every 8 (eight) hours.  30 tablet  0   No current facility-administered medications for this visit.    OBJECTIVE: Middle-aged white African American woman in no acute distress Filed Vitals:   02/06/14 1337  BP: 112/91  Pulse: 98  Temp: 98.5 F (36.9 C)  Resp: 18     Body mass index is 22.15 kg/(m^2).    ECOG FS:1 - Symptomatic but completely ambulatory  Skin: warm, dry  HEENT: sclerae anicteric, conjunctivae pink, oropharynx clear, poor dentition. No thrush. Grade 1 mucositis. Lymph Nodes: No cervical or supraclavicular lymphadenopathy  Lungs: clear to auscultation bilaterally, no rales, wheezes, or rhonci  Heart: regular rate and rhythm  Abdomen: round, soft, non tender, positive bowel sounds  Musculoskeletal: No focal spinal tenderness, no peripheral edema  Neuro: non focal, well oriented, positive affect  Breasts: deferred  LAB RESULTS:  I No results found for this basename: SPEP,  UPEP,   kappa and lambda light chains    Lab Results  Component Value Date   WBC 7.0 02/06/2014   NEUTROABS 4.8 02/06/2014   HGB 9.8* 02/06/2014   HCT 30.0* 02/06/2014   MCV 97.4 02/06/2014   PLT 127* 02/06/2014       Chemistry      Component Value Date/Time   NA 141 02/06/2014 1329   NA 141 01/29/2014 0931   K 4.0 02/06/2014 1329   K 3.0* 01/29/2014 0931   CL 102 01/29/2014 0931   CO2 23 02/06/2014 1329   CO2 25 01/29/2014 0931   BUN 8.5 02/06/2014 1329   BUN 10 01/29/2014 0931   CREATININE 0.6 02/06/2014 1329   CREATININE 0.71 01/29/2014 0931   CREATININE 0.76  10/18/2013 1438      Component Value Date/Time   CALCIUM 8.4 02/06/2014 1329   CALCIUM 7.8* 01/29/2014 0931   ALKPHOS 102 02/06/2014 1329   ALKPHOS 72 01/23/2014 1327   AST 10 02/06/2014 1329   AST 16 01/23/2014 1327   ALT 8 02/06/2014 1329   ALT 23 01/23/2014 1327   BILITOT <0.20 02/06/2014 1329   BILITOT 0.2* 01/23/2014 1327       Lab Results  Component Value Date   LABCA2 1087* 01/23/2014    No components found with this basename: LABCA125    No results found for this basename: INR,  in the last 168 hours  Urinalysis    Component Value Date/Time   COLORURINE YELLOW 01/29/2014 [place] 01/29/2014 1035   LABSPEC 1.007 01/29/2014 1035   PHURINE 7.0 01/29/2014 1035   GLUCOSEU NEGATIVE 01/29/2014 1035   GLUCOSEU NEG mg/dL 07/09/2007 2022   HGBUR TRACE* 01/29/2014 [place] 01/29/2014 [place] 01/29/2014 [place] 01/29/2014 1035   UROBILINOGEN 1.0 01/29/2014 1035   NITRITE NEGATIVE 01/29/2014 1035   LEUKOCYTESUR TRACE* 01/29/2014 1035    STUDIES: Ct Head Wo Contrast  01/29/2014   CLINICAL DATA:  Headache.  Breast cancer.  EXAM: CT HEAD WITHOUT CONTRAST  TECHNIQUE: Contiguous axial images were obtained from the base of the skull through the vertex without intravenous contrast.  COMPARISON:  01/06/2014  FINDINGS: Stable left parietal vasogenic edema from the known intra-axial mass. No new mass effect, midline shift, or acute hemorrhage.  IMPRESSION: Stable left parietal vasogenic edema.  No new abnormality.   Electronically Signed   By: Maryclare Bean M.D.   On: 01/29/2014 09:56    ASSESSMENT: 58 y.o. [place] woman with stage IV breast cancer involving brain, lung, and bones   (1) status post right breast biopsy of 2 separate masses in the right axillary lymph node 12/21/2013 showing a clinical T3 N3, stage IIIC invasive ductal carcinoma, triple negative, with an MIB-1 of 85%; staging studies 01/04/2014 show metastatic disease to the brain, lung, and bones.  (2) status post stereotactic radiosurgery to a 1.7 cm left parietal lesion: 20 Gy given 01/13/2014  (3) systemic therapy will consist of neoadjuvant eribulin, given days 1 and 8 of each 21 day cycle  (4) zolendronate to be started once the patient's dental condition has been optimized  (5) right mastectomy for local control to be considered once systemic disease comes under good control  (6) also status post left lumpectomy and sentinel lymph node sampling 11/23/2007 for a pTis pN0, stage 0 ductal carcinoma in situ, high-grade, estrogen and progesterone receptor negative, status post adjuvant radiation  PLAN: Shannen is tolerating her chemotherapy well. The labs were reviewed in detail and were stable. Her vaginal itching may be a case of yeast, so a prescription for 150mg  diflucan x 1 dose was sent to her pharmacy. She will also start on "magic mouthwash" for her mouth sores.   Starsha will proceed with Day 1, cycle 2 of eribulin today. She will return for labs and an office visit before day 8 next week. Restaging will occur after the 4th cycle is completed. Zolentronate will be added when the patient's dental condition improves.  Jaunita understands and agrees with this plan. She understands the goal of treatment in her case is control. She has been encouraged to call with any issues that might arise before her next visit here.   Marcelino Duster,  NP   02/06/2014 2:50 PM

## 2014-02-06 NOTE — Telephone Encounter (Signed)
, °

## 2014-02-06 NOTE — Telephone Encounter (Signed)
Per staff message and POF I have scheduled appts. Advised scheduler of appts. JMW  

## 2014-02-06 NOTE — Patient Instructions (Signed)
Eribulin solution for injection What is this medicine? ERIBULIN is a chemotherapy drug. It is used to treat breast cancer. This medicine may be used for other purposes; ask your health care provider or pharmacist if you have questions. COMMON BRAND NAME(S): Halaven What should I tell my health care provider before I take this medicine? They need to know if you have any of these conditions: -heart disease -kidney disease -liver disease -low blood counts, like low white cell, platelet, or red cell counts -an unusual or allergic reaction to eribulin, other medicines, foods, dyes, or preservatives -pregnant or trying to get pregnant -breast-feeding How should I use this medicine? This medicine is for infusion into a vein. It is given by a health care professional in a hospital or clinic setting. Talk to your pediatrician regarding the use of this medicine in children. Special care may be needed. Overdosage: If you think you've taken too much of this medicine contact a poison control center or emergency room at once. Overdosage: If you think you have taken too much of this medicine contact a poison control center or emergency room at once. NOTE: This medicine is only for you. Do not share this medicine with others. What if I miss a dose? It is important not to miss your dose. Call your doctor or health care professional if you are unable to keep an appointment. What may interact with this medicine? Do not take this medicine with any of the following medications: -amiodarone -astemizole -arsenic trioxide -bepridil -bretylium -chloroquine -chlorpromazine -cisapride -clarithromycin -dextromethorphan,  quinidine -disopyramide -dofetilide -droperidol -dronedarone -erythromycin -grepafloxacin -halofantrine -haloperidol -ibutilide -levomethadyl -mesoridazine -methadone -pentamidine -procainamide -quinidine -pimozide -posaconazole -probucol -propafenone -saquinavir -sotalol -sparfloxacin -terfenadine -thioridazine -troleandomycin -ziprasidone This list may not describe all possible interactions. Give your health care provider a list of all the medicines, herbs, non-prescription drugs, or dietary supplements you use. Also tell them if you smoke, drink alcohol, or use illegal drugs. Some items may interact with your medicine. What should I watch for while using this medicine? Your condition will be monitored carefully while you are receiving this medicine. This drug may make you feel generally unwell. This is not uncommon, as chemotherapy can affect healthy cells as well as cancer cells. Report any side effects. Continue your course of treatment even though you feel ill unless your doctor tells you to stop. Call your doctor or health care professional for advice if you get a fever, chills or sore throat, or other symptoms of a cold or flu. Do not treat yourself. This drug decreases your body's ability to fight infections. Try to avoid being around people who are sick. This medicine may increase your risk to bruise or bleed. Call your doctor or health care professional if you notice any unusual bleeding. Be careful brushing and flossing your teeth or using a toothpick because you may get an infection or bleed more easily. If you have any dental work done, tell your dentist you are receiving this medicine. Avoid taking products that contain aspirin, acetaminophen, ibuprofen, naproxen, or ketoprofen unless instructed by your doctor. These medicines may hide a fever. Do not become pregnant while taking this medicine. Women should inform their doctor if they wish to become pregnant or think  they might be pregnant. There is a potential for serious side effects to an unborn child. Talk to your health care professional or pharmacist for more information. Do not breast-feed an infant while taking this medicine. What side effects may I notice from receiving this medicine?   Side effects that you should report to your doctor or health care professional as soon as possible: -allergic reactions like skin rash, itching or hives, swelling of the face, lips, or tongue -low blood counts - this medicine may decrease the number of white blood cells, red blood cells and platelets. You may be at increased risk for infections and bleeding. -signs of infection - fever or chills, cough, sore throat, pain or difficulty passing urine -signs of decreased platelets or bleeding - bruising, pinpoint red spots on the skin, black, tarry stools, blood in the urine -signs of decreased red blood cells - unusually weak or tired, fainting spells, lightheadedness -pain, tingling, numbness in the hands or feet Side effects that usually do not require medical attention (Report these to your doctor or health care professional if they continue or are bothersome.): -constipation -hair loss -headache -loss of appetite -muscle or joint pain -nausea, vomiting -stomach pain This list may not describe all possible side effects. Call your doctor for medical advice about side effects. You may report side effects to FDA at 1-800-FDA-1088. Where should I keep my medicine? This drug is given in a hospital or clinic and will not be stored at home. NOTE: This sheet is a summary. It may not cover all possible information. If you have questions about this medicine, talk to your doctor, pharmacist, or health care provider.  2015, Elsevier/Gold Standard. (2009-05-24 23:04:37)  

## 2014-02-07 ENCOUNTER — Ambulatory Visit: Payer: No Typology Code available for payment source

## 2014-02-07 ENCOUNTER — Other Ambulatory Visit: Payer: No Typology Code available for payment source

## 2014-02-13 ENCOUNTER — Other Ambulatory Visit: Payer: Self-pay | Admitting: Hematology

## 2014-02-13 ENCOUNTER — Ambulatory Visit (HOSPITAL_BASED_OUTPATIENT_CLINIC_OR_DEPARTMENT_OTHER): Payer: Medicaid Other

## 2014-02-13 ENCOUNTER — Ambulatory Visit (HOSPITAL_BASED_OUTPATIENT_CLINIC_OR_DEPARTMENT_OTHER): Payer: Medicaid Other | Admitting: Hematology

## 2014-02-13 ENCOUNTER — Other Ambulatory Visit (HOSPITAL_BASED_OUTPATIENT_CLINIC_OR_DEPARTMENT_OTHER): Payer: Medicaid Other

## 2014-02-13 VITALS — BP 165/89 | HR 90 | Temp 98.2°F | Resp 18 | Ht 59.0 in | Wt 115.2 lb

## 2014-02-13 DIAGNOSIS — Z171 Estrogen receptor negative status [ER-]: Secondary | ICD-10-CM

## 2014-02-13 DIAGNOSIS — C7931 Secondary malignant neoplasm of brain: Secondary | ICD-10-CM

## 2014-02-13 DIAGNOSIS — D0590 Unspecified type of carcinoma in situ of unspecified breast: Secondary | ICD-10-CM

## 2014-02-13 DIAGNOSIS — Z5111 Encounter for antineoplastic chemotherapy: Secondary | ICD-10-CM

## 2014-02-13 DIAGNOSIS — C7951 Secondary malignant neoplasm of bone: Secondary | ICD-10-CM

## 2014-02-13 DIAGNOSIS — C50919 Malignant neoplasm of unspecified site of unspecified female breast: Secondary | ICD-10-CM

## 2014-02-13 DIAGNOSIS — C7952 Secondary malignant neoplasm of bone marrow: Secondary | ICD-10-CM

## 2014-02-13 DIAGNOSIS — C7949 Secondary malignant neoplasm of other parts of nervous system: Secondary | ICD-10-CM

## 2014-02-13 DIAGNOSIS — C50419 Malignant neoplasm of upper-outer quadrant of unspecified female breast: Secondary | ICD-10-CM

## 2014-02-13 DIAGNOSIS — C7800 Secondary malignant neoplasm of unspecified lung: Secondary | ICD-10-CM

## 2014-02-13 DIAGNOSIS — C50411 Malignant neoplasm of upper-outer quadrant of right female breast: Secondary | ICD-10-CM

## 2014-02-13 DIAGNOSIS — C773 Secondary and unspecified malignant neoplasm of axilla and upper limb lymph nodes: Secondary | ICD-10-CM

## 2014-02-13 DIAGNOSIS — C50911 Malignant neoplasm of unspecified site of right female breast: Secondary | ICD-10-CM

## 2014-02-13 LAB — COMPREHENSIVE METABOLIC PANEL (CC13)
ALK PHOS: 98 U/L (ref 40–150)
ALT: 16 U/L (ref 0–55)
AST: 9 U/L (ref 5–34)
Albumin: 2.9 g/dL — ABNORMAL LOW (ref 3.5–5.0)
Anion Gap: 9 mEq/L (ref 3–11)
BUN: 12.9 mg/dL (ref 7.0–26.0)
CO2: 25 mEq/L (ref 22–29)
Calcium: 8.3 mg/dL — ABNORMAL LOW (ref 8.4–10.4)
Chloride: 106 mEq/L (ref 98–109)
Creatinine: 0.7 mg/dL (ref 0.6–1.1)
Glucose: 109 mg/dl (ref 70–140)
POTASSIUM: 3.8 meq/L (ref 3.5–5.1)
SODIUM: 141 meq/L (ref 136–145)
TOTAL PROTEIN: 5.8 g/dL — AB (ref 6.4–8.3)
Total Bilirubin: 0.22 mg/dL (ref 0.20–1.20)

## 2014-02-13 LAB — CBC WITH DIFFERENTIAL/PLATELET
BASO%: 0.2 % (ref 0.0–2.0)
Basophils Absolute: 0 10*3/uL (ref 0.0–0.1)
EOS%: 0.3 % (ref 0.0–7.0)
Eosinophils Absolute: 0 10*3/uL (ref 0.0–0.5)
HCT: 29.4 % — ABNORMAL LOW (ref 34.8–46.6)
HGB: 9.7 g/dL — ABNORMAL LOW (ref 11.6–15.9)
LYMPH%: 11.2 % — ABNORMAL LOW (ref 14.0–49.7)
MCH: 32 pg (ref 25.1–34.0)
MCHC: 32.8 g/dL (ref 31.5–36.0)
MCV: 97.6 fL (ref 79.5–101.0)
MONO#: 0.5 10*3/uL (ref 0.1–0.9)
MONO%: 5.4 % (ref 0.0–14.0)
NEUT%: 82.9 % — ABNORMAL HIGH (ref 38.4–76.8)
NEUTROS ABS: 8 10*3/uL — AB (ref 1.5–6.5)
Platelets: 345 10*3/uL (ref 145–400)
RBC: 3.02 10*6/uL — AB (ref 3.70–5.45)
RDW: 14.6 % — AB (ref 11.2–14.5)
WBC: 9.7 10*3/uL (ref 3.9–10.3)
lymph#: 1.1 10*3/uL (ref 0.9–3.3)

## 2014-02-13 LAB — TECHNOLOGIST REVIEW

## 2014-02-13 MED ORDER — DEXAMETHASONE SODIUM PHOSPHATE 10 MG/ML IJ SOLN
10.0000 mg | Freq: Once | INTRAMUSCULAR | Status: AC
  Administered 2014-02-13: 10 mg via INTRAVENOUS

## 2014-02-13 MED ORDER — HEPARIN SOD (PORK) LOCK FLUSH 100 UNIT/ML IV SOLN
500.0000 [IU] | Freq: Once | INTRAVENOUS | Status: AC | PRN
  Administered 2014-02-13: 500 [IU]
  Filled 2014-02-13: qty 5

## 2014-02-13 MED ORDER — SODIUM CHLORIDE 0.9 % IJ SOLN
10.0000 mL | INTRAMUSCULAR | Status: DC | PRN
  Administered 2014-02-13: 10 mL
  Filled 2014-02-13: qty 10

## 2014-02-13 MED ORDER — SODIUM CHLORIDE 0.9 % IV SOLN
Freq: Once | INTRAVENOUS | Status: AC
  Administered 2014-02-13: 10:00:00 via INTRAVENOUS

## 2014-02-13 MED ORDER — ONDANSETRON 8 MG/NS 50 ML IVPB
INTRAVENOUS | Status: AC
  Filled 2014-02-13: qty 8

## 2014-02-13 MED ORDER — SODIUM CHLORIDE 0.9 % IV SOLN
1.4000 mg/m2 | Freq: Once | INTRAVENOUS | Status: AC
  Administered 2014-02-13: 2 mg via INTRAVENOUS
  Filled 2014-02-13: qty 4

## 2014-02-13 MED ORDER — ONDANSETRON 8 MG/50ML IVPB (CHCC)
8.0000 mg | Freq: Once | INTRAVENOUS | Status: AC
  Administered 2014-02-13: 8 mg via INTRAVENOUS

## 2014-02-13 MED ORDER — DEXAMETHASONE SODIUM PHOSPHATE 10 MG/ML IJ SOLN
INTRAMUSCULAR | Status: AC
  Filled 2014-02-13: qty 1

## 2014-02-13 NOTE — Progress Notes (Signed)
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 The patient then had routine yearly mammography until 10/24/2009.  Sometime in 2014 the patient noted a mass in her right breast. She states she brought this to medical attention several times, although I do not see that reflected in the outside notes we have. In any case on 12/06/2013 the patient presented to her primary clinic with a complaint of left-sided breast pain. Exam showed the right breast however to be larger than the left. There was a thickened area versus lump at the 12:00 position in the right breast above the areola. There was also a mass in the right upper quadrant of the right breast. The patient was referred to the breast Center and on 6:30 at 2015 bilateral diagnostic mammography and right breast ultrasonography at the breast Center showed a 6 cm area in the right breast upper outer quadrant with increased density associated with branching calcifications. There was a separate area more medially that also appeared abnormal. There was diffuse skin thickening and interval enlarged right axillary lymph nodes. On exam the mammographer was able to palpate a 5 cm firm fixed  palpable mass in the upper-outer quadrant of the right breast, but mild skin redness. There was also a palpable right axillary lymph node. Ultrasound confirmed a large irregular hypoechoic mass at the 11:00 position of the right breast measuring 4.1 cm maximally. More laterally a separate mass in the 9:00 position measure 4.4 cm maximally. In the right axilla there were multiple abnormal appearing axillary lymph nodes.  Biopsy of both these suspicious masses in the right breast as well as one of the suspicious lymph nodes 12/21/2013 showed ([id number]) all 3 biopsies do show identical invasive ductal carcinoma, grade 3, triple negative, with an MIB-1 of 85%. On 12/26/2013 the patient underwent bilateral breast MRI. This showed an abnormal area measuring 7.2 cm in the central right breast involving or 4 quadrants and centered superiorly, with diffuse skin thickening as well as level I and 2 metastatic right axillary adenopathy. There was also an enlarged right internal mammary lymph node. The left side was unremarkable.   INTERVAL HISTORY: The patient returns today with her daughter, [name] for followup of her metastatic breast cancer. Today is day 1 cycle 2 of eribulin, given on day 1 and 8 of each 21 day cycle. Since her last visit, her hemorrhoids have resolved. She had better more frequent bowel movements now that she regularly takes miralax. She visited the ED last week for photophobic headaches, but has been prescribed Fioricet which helps. She notices her vision is starting to change and is more blurry. She has cut her smoking down to 2 cigarettes a day which is impressive. Most recently she has intense vaginal itching. She can't recall any discharge that she's noticed, and denies any odors.  She also complains of isolated mouth sores.  REVIEW OF SYSTEMS: [name] denies fevers, chills, nausea, vomiting, or peripheral neuropathy. She still has pain around her left axilla, but she's noticed her breast  swelling has decreased. A detailed review of systems was otherwise negative, except as noted above.   PAST MEDICAL HISTORY: Past Medical History  Diagnosis Date  . Acid reflux   . Asthma   . COPD (chronic obstructive pulmonary disease)   . Cancer     lumpectomy, radiation 2009  . Seasonal allergies   . Alcohol abuse   . Elevated d-dimer     negative chest CT  . Allergy   . Emphysema of lung   . Hot flashes   . Reading disorder   . Impaired writing skills   . Wears glasses   . Wears dentures     top  . HOH (hard of hearing)     left ear    PAST SURGICAL HISTORY: Past Surgical History  Procedure Laterality Date  . Breast lumpectomy    . Abdominal hysterectomy    . Portacath placement Left 12/30/2013    Procedure: INSERTION PORT-A-CATH;  Surgeon: Diana Roof, MD;  Location: [facility];  Service: General;  Laterality: Left;  subclavian area    FAMILY HISTORY Family History  Problem Relation Age of Onset  . Hypertension Other    the patient has little information about her father or his side of the family. The patient's mother died at the age of 65. The patient had 2 brothers and 4 sisters. There is no history of breast or ovarian cancer in the family to her knowledge.  GYNECOLOGIC HISTORY:  No LMP recorded. Patient has had a hysterectomy. The patient does not remember when she started menstruating. First live birth age 60. She is GX P1. She underwent hysterectomy approximately 12 years ago, but does not know whether she had bilateral salpingo-oophorectomy. 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 She is married but separated from her husband [name], who lives in [place]. The patient lives by herself, with no pets. Her daughter [name] works as a Stage manager for [employer] care in [place] the patient has 3 grandchildren and 2 great-grandchildren. She  is a Psychologist, forensic.    ADVANCED DIRECTIVES: Not in place; at her 12/28/2013 visit the patient was given the appropriate documents to complete and notarize so she may declare a healthcare power of attorney at her discretion   HEALTH MAINTENANCE: History  Substance Use Topics  . Smoking status: Current Every Day Smoker -- 1.00 packs/day for 30 years    Types: Cigarettes  . Smokeless tobacco: Not on file  . Alcohol Use: Yes     Comment: occ beer     Colonoscopy: Never  PAP: Status post hysterectomy  Bone density: Never  Lipid panel:  Allergies  Allergen Reactions  . Penicillins Other (See Comments)    Reaction a long time ago    Current Outpatient Prescriptions  Medication Sig Dispense Refill  . albuterol (PROVENTIL HFA;VENTOLIN HFA) 108 (90 BASE) MCG/ACT inhaler Inhale 2 puffs into the lungs every 6 (six) hours as needed for wheezing or shortness of breath. For shortness of breath  3 Inhaler  3  . butalbital-acetaminophen-caffeine (FIORICET) 50-325-40 MG per tablet Take 1-2 tablets by mouth every 6 (six) hours as needed for headache.  20 tablet  0  . carbamide peroxide (DEBROX) 6.5 %  otic solution Place 5 drops into both ears 2 (two) times daily.  15 mL  1  . dexamethasone (DECADRON) 4 MG tablet Take 4 mg by mouth 2 (two) times daily.      . Diphenhyd-Hydrocort-Nystatin (FIRST-DUKES MOUTHWASH) SUSP Use as directed 5 mLs in the mouth or throat 4 (four) times daily. Swish, gargle and spit one to two teaspoonfuls every six hours as needed. May be swallowed if esophageal involvement.  120 mL  1  . fluticasone (FLOVENT HFA) 220 MCG/ACT inhaler Inhale 1 puff into the lungs 2 (two) times daily.  3 Inhaler  3  . hydrocortisone (ANUSOL-HC) 25 MG suppository Place 1 suppository (25 mg total) rectally 2 (two) times daily.  12 suppository  1  . lidocaine-prilocaine (EMLA) cream Apply 1 application topically as needed. Apply over port area 1-2 hours before chemo, cover with plastic wrap  30 g  0  .  LORazepam (ATIVAN) 0.5 MG tablet Take 1 tablet (0.5 mg total) by mouth every 8 (eight) hours.  30 tablet  0  . ondansetron (ZOFRAN) 8 MG tablet Take 1 tablet (8 mg total) by mouth 2 (two) times daily. Start the day after chemo for 2 days. Then take as needed for nausea or vomiting.  30 tablet  1  . oxyCODONE-acetaminophen (ROXICET) 5-325 MG per tablet Take 1-2 tablets by mouth every 4 (four) hours as needed.  50 tablet  0  . pantoprazole (PROTONIX) 40 MG tablet Take 1 tablet (40 mg total) by mouth daily.  30 tablet  2  . prochlorperazine (COMPAZINE) 10 MG tablet Take 1 tablet (10 mg total) by mouth every 6 (six) hours as needed (Nausea or vomiting).  30 tablet  1   No current facility-administered medications for this visit.    OBJECTIVE: Middle-aged white African American woman in no acute distress Filed Vitals:   02/13/14 0836  BP: 165/89  Pulse: 90  Temp: 98.2 F (36.8 C)  Resp: 18     Body mass index is 23.25 kg/(m^2).    ECOG FS:1 - Symptomatic but completely ambulatory  Skin: warm, dry  HEENT: sclerae anicteric, conjunctivae pink, oropharynx clear, poor dentition. No thrush. No mucositis. Lymph Nodes: No cervical or supraclavicular lymphadenopathy  Lungs: clear to auscultation bilaterally, no rales, wheezes, or rhonci  Heart: regular rate and rhythm  Abdomen: round, soft, non tender, positive bowel sounds  Musculoskeletal: No focal spinal tenderness, no peripheral edema  Neuro: non focal, well oriented, positive affect  Breasts: Right breast mass behind areola feels about 3-4 cm in size so smaller, no adenopathy or skin changes noted  LAB RESULTS:       STUDIES: Ct Head Wo Contrast  01/29/2014   CLINICAL DATA:  Headache.  Breast cancer.  EXAM: CT HEAD WITHOUT CONTRAST  TECHNIQUE: Contiguous axial images were obtained from the base of the skull through the vertex without intravenous contrast.  COMPARISON:  01/06/2014  FINDINGS: Stable left parietal vasogenic edema from the  known intra-axial mass. No new mass effect, midline shift, or acute hemorrhage.  IMPRESSION: Stable left parietal vasogenic edema.  No new abnormality.   Electronically Signed   By: Maryclare Bean M.D.   On: 01/29/2014 09:56   ASSESSMENT: 58 y.o. [place] woman with stage IV breast cancer involving brain, lung, and bones   (1) status post right breast biopsy of 2 separate masses in the right axillary lymph node 12/21/2013 showing a clinical T3 N3, stage IIIC invasive ductal carcinoma, triple negative, with an MIB-1  of 85%; staging studies 01/04/2014 show metastatic disease to the brain, lung, and bones.  (2) status post stereotactic radiosurgery to a 1.7 cm left parietal lesion: 20 Gy given 01/13/2014  (3) systemic therapy consists of neoadjuvant eribulin, given days 1 and 8 of each 21 day cycle  (4) zolendronate to be started once the patient's dental condition has been optimized. I am arranging for her to see a dentist.  (5) right mastectomy for local control to be considered once systemic disease comes under good control  (6) also status post left lumpectomy and sentinel lymph node sampling 11/23/2007 for a pTis pN0, stage 0 ductal carcinoma in situ, high-grade, estrogen and progesterone receptor negative, status post adjuvant radiation  PLAN:  Bro[place]lin is tolerating her chemotherapy well. The labs were reviewed in detail and were stable. Her vaginal itching last week has subsided. She Started  "magic mouthwash" for her mouth sores and is much better.  Evalene will proceed with Day 8 cycle 2 of eribulin today. She will return for labs and an office visit before day 1 cycle 3. Restaging will occur after the 4th cycle is completed. Zolentronate will be added when the patient's dental condition improves.  Sapphira understands and agrees with this plan. She understands the goal of treatment in her case is control. She has been encouraged to call with any issues that might arise before her next visit  here.   I also want her to see an opthalmologist for eye check up.  Bernadene Bell, MD Medical Hematologist/Oncologist [place] Pager: [phone] Office No: [phone]    02/13/2014 9:36 AM

## 2014-02-13 NOTE — Addendum Note (Signed)
Addended by: Hayes Ludwig on: 02/13/2014 11:24 PM   Modules accepted: Orders

## 2014-02-13 NOTE — Patient Instructions (Signed)
[facility] Discharge Instructions for Patients Receiving Chemotherapy  Today you received the following chemotherapy agents:  Halaven  To help prevent nausea and vomiting after your treatment, we encourage you to take your nausea medication as ordered per MD.   If you develop nausea and vomiting that is not controlled by your nausea medication, call the clinic.   BELOW ARE SYMPTOMS THAT SHOULD BE REPORTED IMMEDIATELY:  *FEVER GREATER THAN 100.5 F  *CHILLS WITH OR WITHOUT FEVER  NAUSEA AND VOMITING THAT IS NOT CONTROLLED WITH YOUR NAUSEA MEDICATION  *UNUSUAL SHORTNESS OF BREATH  *UNUSUAL BRUISING OR BLEEDING  TENDERNESS IN MOUTH AND THROAT WITH OR WITHOUT PRESENCE OF ULCERS  *URINARY PROBLEMS  *BOWEL PROBLEMS  UNUSUAL RASH Items with * indicate a potential emergency and should be followed up as soon as possible.  Feel free to call the clinic you have any questions or concerns. The clinic phone number is [phone].    

## 2014-02-14 ENCOUNTER — Other Ambulatory Visit: Payer: No Typology Code available for payment source

## 2014-02-14 ENCOUNTER — Ambulatory Visit: Payer: No Typology Code available for payment source

## 2014-02-21 ENCOUNTER — Encounter: Payer: Self-pay | Admitting: Radiation Oncology

## 2014-02-27 ENCOUNTER — Ambulatory Visit (HOSPITAL_BASED_OUTPATIENT_CLINIC_OR_DEPARTMENT_OTHER): Payer: Medicaid Other | Admitting: Nurse Practitioner

## 2014-02-27 ENCOUNTER — Ambulatory Visit (HOSPITAL_BASED_OUTPATIENT_CLINIC_OR_DEPARTMENT_OTHER): Payer: Medicaid Other

## 2014-02-27 ENCOUNTER — Encounter: Payer: Self-pay | Admitting: Radiation Oncology

## 2014-02-27 ENCOUNTER — Encounter: Payer: Self-pay | Admitting: Nurse Practitioner

## 2014-02-27 ENCOUNTER — Encounter: Payer: Self-pay | Admitting: Internal Medicine

## 2014-02-27 ENCOUNTER — Telehealth: Payer: Self-pay | Admitting: Nurse Practitioner

## 2014-02-27 ENCOUNTER — Other Ambulatory Visit (HOSPITAL_BASED_OUTPATIENT_CLINIC_OR_DEPARTMENT_OTHER): Payer: Medicaid Other

## 2014-02-27 ENCOUNTER — Telehealth: Payer: Self-pay | Admitting: *Deleted

## 2014-02-27 ENCOUNTER — Ambulatory Visit: Payer: Medicaid Other | Attending: Internal Medicine | Admitting: Internal Medicine

## 2014-02-27 ENCOUNTER — Ambulatory Visit
Admission: RE | Admit: 2014-02-27 | Discharge: 2014-02-27 | Disposition: A | Discharge status: Home / Self Care | Payer: No Typology Code available for payment source | Source: Ambulatory Visit | Attending: Radiation Oncology | Admitting: Radiation Oncology

## 2014-02-27 VITALS — BP 165/73 | HR 89 | Temp 98.1°F | Resp 20 | Ht 59.0 in | Wt 123.3 lb

## 2014-02-27 VITALS — BP 165/95 | HR 93 | Temp 98.5°F | Resp 16 | Wt 124.6 lb

## 2014-02-27 VITALS — BP 165/68 | HR 78 | Temp 98.6°F | Resp 20 | Ht 59.0 in | Wt 122.0 lb

## 2014-02-27 DIAGNOSIS — C50911 Malignant neoplasm of unspecified site of right female breast: Secondary | ICD-10-CM

## 2014-02-27 DIAGNOSIS — J449 Chronic obstructive pulmonary disease, unspecified: Secondary | ICD-10-CM | POA: Insufficient documentation

## 2014-02-27 DIAGNOSIS — C7952 Secondary malignant neoplasm of bone marrow: Secondary | ICD-10-CM

## 2014-02-27 DIAGNOSIS — Z923 Personal history of irradiation: Secondary | ICD-10-CM | POA: Insufficient documentation

## 2014-02-27 DIAGNOSIS — C7800 Secondary malignant neoplasm of unspecified lung: Secondary | ICD-10-CM

## 2014-02-27 DIAGNOSIS — C7951 Secondary malignant neoplasm of bone: Secondary | ICD-10-CM

## 2014-02-27 DIAGNOSIS — J42 Unspecified chronic bronchitis: Secondary | ICD-10-CM

## 2014-02-27 DIAGNOSIS — Z79899 Other long term (current) drug therapy: Secondary | ICD-10-CM | POA: Insufficient documentation

## 2014-02-27 DIAGNOSIS — C50419 Malignant neoplasm of upper-outer quadrant of unspecified female breast: Secondary | ICD-10-CM

## 2014-02-27 DIAGNOSIS — C50919 Malignant neoplasm of unspecified site of unspecified female breast: Secondary | ICD-10-CM | POA: Insufficient documentation

## 2014-02-27 DIAGNOSIS — Z23 Encounter for immunization: Secondary | ICD-10-CM

## 2014-02-27 DIAGNOSIS — C7931 Secondary malignant neoplasm of brain: Secondary | ICD-10-CM

## 2014-02-27 DIAGNOSIS — E559 Vitamin D deficiency, unspecified: Secondary | ICD-10-CM

## 2014-02-27 DIAGNOSIS — K219 Gastro-esophageal reflux disease without esophagitis: Secondary | ICD-10-CM | POA: Insufficient documentation

## 2014-02-27 DIAGNOSIS — F17200 Nicotine dependence, unspecified, uncomplicated: Secondary | ICD-10-CM

## 2014-02-27 DIAGNOSIS — C773 Secondary and unspecified malignant neoplasm of axilla and upper limb lymph nodes: Secondary | ICD-10-CM

## 2014-02-27 DIAGNOSIS — J4489 Other specified chronic obstructive pulmonary disease: Secondary | ICD-10-CM

## 2014-02-27 DIAGNOSIS — C7949 Secondary malignant neoplasm of other parts of nervous system: Principal | ICD-10-CM

## 2014-02-27 DIAGNOSIS — Z5111 Encounter for antineoplastic chemotherapy: Secondary | ICD-10-CM

## 2014-02-27 DIAGNOSIS — I10 Essential (primary) hypertension: Secondary | ICD-10-CM | POA: Diagnosis not present

## 2014-02-27 DIAGNOSIS — C50411 Malignant neoplasm of upper-outer quadrant of right female breast: Secondary | ICD-10-CM

## 2014-02-27 HISTORY — DX: Personal history of irradiation: Z92.3

## 2014-02-27 LAB — COMPREHENSIVE METABOLIC PANEL (CC13)
ALT: 12 U/L (ref 0–55)
ANION GAP: 12 meq/L — AB (ref 3–11)
AST: 10 U/L (ref 5–34)
Albumin: 2.9 g/dL — ABNORMAL LOW (ref 3.5–5.0)
Alkaline Phosphatase: 93 U/L (ref 40–150)
BUN: 18.5 mg/dL (ref 7.0–26.0)
CALCIUM: 8.2 mg/dL — AB (ref 8.4–10.4)
CHLORIDE: 108 meq/L (ref 98–109)
CO2: 22 mEq/L (ref 22–29)
Creatinine: 0.8 mg/dL (ref 0.6–1.1)
GLUCOSE: 136 mg/dL (ref 70–140)
Potassium: 3.6 mEq/L (ref 3.5–5.1)
SODIUM: 142 meq/L (ref 136–145)
TOTAL PROTEIN: 6.1 g/dL — AB (ref 6.4–8.3)
Total Bilirubin: <0.2 mg/dL (ref 0.20–1.20)

## 2014-02-27 LAB — CBC WITH DIFFERENTIAL/PLATELET
BASO%: 0.2 % (ref 0.0–2.0)
Basophils Absolute: 0 10*3/uL (ref 0.0–0.1)
EOS%: 0 % (ref 0.0–7.0)
Eosinophils Absolute: 0 10*3/uL (ref 0.0–0.5)
HEMATOCRIT: 32.5 % — AB (ref 34.8–46.6)
HGB: 10.6 g/dL — ABNORMAL LOW (ref 11.6–15.9)
LYMPH%: 8.2 % — AB (ref 14.0–49.7)
MCH: 31.5 pg (ref 25.1–34.0)
MCHC: 32.6 g/dL (ref 31.5–36.0)
MCV: 96.4 fL (ref 79.5–101.0)
MONO#: 0.8 10*3/uL (ref 0.1–0.9)
MONO%: 5.3 % (ref 0.0–14.0)
NEUT#: 12.4 10*3/uL — ABNORMAL HIGH (ref 1.5–6.5)
NEUT%: 86.3 % — AB (ref 38.4–76.8)
PLATELETS: 334 10*3/uL (ref 145–400)
RBC: 3.37 10*6/uL — ABNORMAL LOW (ref 3.70–5.45)
RDW: 17.7 % — ABNORMAL HIGH (ref 11.2–14.5)
WBC: 14.4 10*3/uL — ABNORMAL HIGH (ref 3.9–10.3)
lymph#: 1.2 10*3/uL (ref 0.9–3.3)
nRBC: 1 % — ABNORMAL HIGH (ref 0–0)

## 2014-02-27 LAB — TECHNOLOGIST REVIEW

## 2014-02-27 MED ORDER — HEPARIN SOD (PORK) LOCK FLUSH 100 UNIT/ML IV SOLN
500.0000 [IU] | Freq: Once | INTRAVENOUS | Status: AC | PRN
  Administered 2014-02-27: 500 [IU]
  Filled 2014-02-27: qty 5

## 2014-02-27 MED ORDER — SODIUM CHLORIDE 0.9 % IV SOLN
Freq: Once | INTRAVENOUS | Status: AC
  Administered 2014-02-27: 11:00:00 via INTRAVENOUS

## 2014-02-27 MED ORDER — SODIUM CHLORIDE 0.9 % IJ SOLN
10.0000 mL | INTRAMUSCULAR | Status: DC | PRN
  Administered 2014-02-27: 10 mL
  Filled 2014-02-27: qty 10

## 2014-02-27 MED ORDER — AMLODIPINE BESYLATE 2.5 MG PO TABS: 2.5000 mg | ORAL_TABLET | Freq: Every day | ORAL | Status: DC

## 2014-02-27 MED ORDER — SODIUM CHLORIDE 0.9 % IV SOLN
1.4000 mg/m2 | Freq: Once | INTRAVENOUS | Status: AC
  Administered 2014-02-27: 2 mg via INTRAVENOUS
  Filled 2014-02-27: qty 4

## 2014-02-27 MED ORDER — DEXAMETHASONE SODIUM PHOSPHATE 10 MG/ML IJ SOLN
10.0000 mg | Freq: Once | INTRAMUSCULAR | Status: AC
  Administered 2014-02-27: 10 mg via INTRAVENOUS

## 2014-02-27 MED ORDER — ONDANSETRON 8 MG/50ML IVPB (CHCC)
8.0000 mg | Freq: Once | INTRAVENOUS | Status: AC
  Administered 2014-02-27: 8 mg via INTRAVENOUS

## 2014-02-27 MED ORDER — ALBUTEROL SULFATE HFA 108 (90 BASE) MCG/ACT IN AERS: 2.0000 | INHALATION_SPRAY | Freq: Four times a day (QID) | RESPIRATORY_TRACT | Status: DC | PRN

## 2014-02-27 MED ORDER — DEXAMETHASONE SODIUM PHOSPHATE 10 MG/ML IJ SOLN
INTRAMUSCULAR | Status: AC
  Filled 2014-02-27: qty 1

## 2014-02-27 MED ORDER — FLUTICASONE PROPIONATE HFA 220 MCG/ACT IN AERO: 1.0000 | INHALATION_SPRAY | Freq: Two times a day (BID) | RESPIRATORY_TRACT | Status: DC

## 2014-02-27 MED ORDER — ONDANSETRON 8 MG/NS 50 ML IVPB
INTRAVENOUS | Status: AC
  Filled 2014-02-27: qty 8

## 2014-02-27 NOTE — Telephone Encounter (Signed)
Per staff message and POF I have scheduled appts. Advised scheduler of appts.Per APP ok to move 10/2 to 10/5   JMW

## 2014-02-27 NOTE — Patient Instructions (Signed)
DASH Eating Plan °DASH stands for "Dietary Approaches to Stop Hypertension." The DASH eating plan is a healthy eating plan that has been shown to reduce high blood pressure (hypertension). Additional health benefits may include reducing the risk of type 2 diabetes mellitus, heart disease, and stroke. The DASH eating plan may also help with weight loss. °WHAT DO I NEED TO KNOW ABOUT THE DASH EATING PLAN? °For the DASH eating plan, you will follow these general guidelines: °· Choose foods with a percent daily value for sodium of less than 5% (as listed on the food label). °· Use salt-free seasonings or herbs instead of table salt or sea salt. °· Check with your health care provider or pharmacist before using salt substitutes. °· Eat lower-sodium products, often labeled as "lower sodium" or "no salt added." °· Eat fresh foods. °· Eat more vegetables, fruits, and low-fat dairy products. °· Choose whole grains. Look for the word "whole" as the first word in the ingredient list. °· Choose fish and skinless chicken or turkey more often than red meat. Limit fish, poultry, and meat to 6 oz (170 g) each day. °· Limit sweets, desserts, sugars, and sugary drinks. °· Choose heart-healthy fats. °· Limit cheese to 1 oz (28 g) per day. °· Eat more home-cooked food and less restaurant, buffet, and fast food. °· Limit fried foods. °· Cook foods using methods other than frying. °· Limit canned vegetables. If you do use them, rinse them well to decrease the sodium. °· When eating at a restaurant, ask that your food be prepared with less salt, or no salt if possible. °WHAT FOODS CAN I EAT? °Seek help from a dietitian for individual calorie needs. °Grains °Whole grain or whole wheat bread. Brown rice. Whole grain or whole wheat pasta. Quinoa, bulgur, and whole grain cereals. Low-sodium cereals. Corn or whole wheat flour tortillas. Whole grain cornbread. Whole grain crackers. Low-sodium crackers. °Vegetables °Fresh or frozen vegetables  (raw, steamed, roasted, or grilled). Low-sodium or reduced-sodium tomato and vegetable juices. Low-sodium or reduced-sodium tomato sauce and paste. Low-sodium or reduced-sodium canned vegetables.  °Fruits °All fresh, canned (in natural juice), or frozen fruits. °Meat and Other Protein Products °Ground beef (85% or leaner), grass-fed beef, or beef trimmed of fat. Skinless chicken or turkey. Ground chicken or turkey. Pork trimmed of fat. All fish and seafood. Eggs. Dried beans, peas, or lentils. Unsalted nuts and seeds. Unsalted canned beans. °Dairy °Low-fat dairy products, such as skim or 1% milk, 2% or reduced-fat cheeses, low-fat ricotta or cottage cheese, or plain low-fat yogurt. Low-sodium or reduced-sodium cheeses. °Fats and Oils °Tub margarines without trans fats. Light or reduced-fat mayonnaise and salad dressings (reduced sodium). Avocado. Safflower, olive, or canola oils. Natural peanut or almond butter. °Other °Unsalted popcorn and pretzels. °The items listed above may not be a complete list of recommended foods or beverages. Contact your dietitian for more options. °WHAT FOODS ARE NOT RECOMMENDED? °Grains °White bread. White pasta. White rice. Refined cornbread. Bagels and croissants. Crackers that contain trans fat. °Vegetables °Creamed or fried vegetables. Vegetables in a cheese sauce. Regular canned vegetables. Regular canned tomato sauce and paste. Regular tomato and vegetable juices. °Fruits °Dried fruits. Canned fruit in light or heavy syrup. Fruit juice. °Meat and Other Protein Products °Fatty cuts of meat. Ribs, chicken wings, bacon, sausage, bologna, salami, chitterlings, fatback, hot dogs, bratwurst, and packaged luncheon meats. Salted nuts and seeds. Canned beans with salt. °Dairy °Whole or 2% milk, cream, half-and-half, and cream cheese. Whole-fat or sweetened yogurt. Full-fat   cheeses or blue cheese. Nondairy creamers and whipped toppings. Processed cheese, cheese spreads, or cheese  curds. °Condiments °Onion and garlic salt, seasoned salt, table salt, and sea salt. Canned and packaged gravies. Worcestershire sauce. Tartar sauce. Barbecue sauce. Teriyaki sauce. Soy sauce, including reduced sodium. Steak sauce. Fish sauce. Oyster sauce. Cocktail sauce. Horseradish. Ketchup and mustard. Meat flavorings and tenderizers. Bouillon cubes. Hot sauce. Tabasco sauce. Marinades. Taco seasonings. Relishes. °Fats and Oils °Butter, stick margarine, lard, shortening, ghee, and bacon fat. Coconut, palm kernel, or palm oils. Regular salad dressings. °Other °Pickles and olives. Salted popcorn and pretzels. °The items listed above may not be a complete list of foods and beverages to avoid. Contact your dietitian for more information. °WHERE CAN I FIND MORE INFORMATION? °National Heart, [employer], and [employer] Institute: www.nhlbi.nih.gov/health/health-topics/topics/dash/ °Document Released: 05/22/2011 Document Revised: 10/17/2013 Document Reviewed: 04/06/2013 °ExitCare® Patient Information ©2015 [employer]. This information is not intended to replace advice given to you by your health care provider. Make sure you discuss any questions you have with your health care provider. ° °

## 2014-02-27 NOTE — Progress Notes (Signed)
MRN: [id number] Name: [name]  Sex: female Age: 58 y.o. DOB: 1955/12/13  Allergies: Penicillins  Chief Complaint  Patient presents with  . Follow-up    HPI: Patient is 58 y.o. female who has history of COPD comes today for followup, she also has is she of breast cancer currently following up with oncology and is undergoing chemotherapy treatment, her recent blood pressure trend has been high, she does report family history of hypertension currently denies any headache dizziness chest pain, does have baseline shortness of breath, she still smokes cigarettes, denies any fever chills have been using her inhalers and is requesting refill.   Past Medical History  Diagnosis Date  . Acid reflux   . Asthma   . COPD (chronic obstructive pulmonary disease)   . Cancer     lumpectomy, radiation 2009  . Seasonal allergies   . Alcohol abuse   . Elevated d-dimer     negative chest CT  . Allergy   . Emphysema of lung   . Hot flashes   . Reading disorder   . Impaired writing skills   . Wears glasses   . Wears dentures     top  . HOH (hard of hearing)     left ear  . S/P radiation therapy 01/13/14    SRS left parietal 20Gy    Past Surgical History  Procedure Laterality Date  . Breast lumpectomy    . Abdominal hysterectomy    . Portacath placement Left 12/30/2013    Procedure: INSERTION PORT-A-CATH;  Surgeon: Merrie Roof, MD;  Location: [facility];  Service: General;  Laterality: Left;  subclavian area      Medication List       This list is accurate as of: 02/27/14  2:53 PM.  Always use your most recent med list.               albuterol 108 (90 BASE) MCG/ACT inhaler  Commonly known as:  PROVENTIL HFA;VENTOLIN HFA  Inhale 2 puffs into the lungs every 6 (six) hours as needed for wheezing or shortness of breath. For shortness of breath     amLODipine 2.5 MG tablet  Commonly known as:  NORVASC  Take 1 tablet (2.5 mg total) by mouth daily.     butalbital-acetaminophen-caffeine 50-325-40 MG per tablet  Commonly known as:  FIORICET  Take 1-2 tablets by mouth every 6 (six) hours as needed for headache.     carbamide peroxide 6.5 % otic solution  Commonly known as:  DEBROX  Place 5 drops into both ears 2 (two) times daily.     dexamethasone 4 MG tablet  Commonly known as:  DECADRON  Take 4 mg by mouth 2 (two) times daily.     FIRST-DUKES MOUTHWASH Susp  Use as directed 5 mLs in the mouth or throat 4 (four) times daily. Swish, gargle and spit one to two teaspoonfuls every six hours as needed. May be swallowed if esophageal involvement.     fluticasone 220 MCG/ACT inhaler  Commonly known as:  FLOVENT HFA  Inhale 1 puff into the lungs 2 (two) times daily.     hydrocortisone 25 MG suppository  Commonly known as:  ANUSOL-HC  Place 25 mg rectally 2 (two) times daily as needed.     lidocaine-prilocaine cream  Commonly known as:  EMLA  Apply 1 application topically as needed. Apply over port area 1-2 hours before chemo, cover with plastic wrap     LORazepam  0.5 MG tablet  Commonly known as:  ATIVAN  Take 1 tablet (0.5 mg total) by mouth every 8 (eight) hours.     ondansetron 8 MG tablet  Commonly known as:  ZOFRAN  Take 1 tablet (8 mg total) by mouth 2 (two) times daily. Start the day after chemo for 2 days. Then take as needed for nausea or vomiting.     oxyCODONE-acetaminophen 5-325 MG per tablet  Commonly known as:  ROXICET  Take 1-2 tablets by mouth every 4 (four) hours as needed.     pantoprazole 40 MG tablet  Commonly known as:  PROTONIX  Take 1 tablet (40 mg total) by mouth daily.     prochlorperazine 10 MG tablet  Commonly known as:  COMPAZINE  Take 1 tablet (10 mg total) by mouth every 6 (six) hours as needed (Nausea or vomiting).        Meds ordered this encounter  Medications  . fluticasone (FLOVENT HFA) 220 MCG/ACT inhaler    Sig: Inhale 1 puff into the lungs 2 (two) times daily.    Dispense:  3  Inhaler    Refill:  3  . amLODipine (NORVASC) 2.5 MG tablet    Sig: Take 1 tablet (2.5 mg total) by mouth daily.    Dispense:  90 tablet    Refill:  3  . albuterol (PROVENTIL HFA;VENTOLIN HFA) 108 (90 BASE) MCG/ACT inhaler    Sig: Inhale 2 puffs into the lungs every 6 (six) hours as needed for wheezing or shortness of breath. For shortness of breath    Dispense:  3 Inhaler    Refill:  3    Immunization History  Administered Date(s) Administered  . Influenza,inj,Quad PF,36+ Mos 07/19/2013  . Tdap 07/19/2013    Family History  Problem Relation Age of Onset  . Hypertension Other     History  Substance Use Topics  . Smoking status: Current Every Day Smoker -- 1.00 packs/day for 30 years    Types: Cigarettes  . Smokeless tobacco: Not on file     Comment: down to 1-2 cigarettes daily6  . Alcohol Use: Yes     Comment: occ beer    Review of Systems   As noted in HPI  Filed Vitals:   02/27/14 1434  BP: 165/95  Pulse: 93  Temp: 98.5 F (36.9 C)  Resp: 16    Physical Exam  Physical Exam  Constitutional: No distress.  Eyes: EOM are normal. Pupils are equal, round, and reactive to light.  Cardiovascular: Normal rate and regular rhythm.   Pulmonary/Chest: Breath sounds normal. No respiratory distress. She has no wheezes. She has no rales.  Musculoskeletal: She exhibits no edema.    CBC    Component Value Date/Time   WBC 14.4* 02/27/2014 0923   WBC 4.1 01/29/2014 0931   RBC 3.37* 02/27/2014 0923   RBC 3.52* 01/29/2014 0931   RBC 3.61* 07/05/2007 0630   HGB 10.6* 02/27/2014 0923   HGB 11.3* 01/29/2014 0931   HCT 32.5* 02/27/2014 0923   HCT 32.1* 01/29/2014 0931   PLT 334 02/27/2014 0923   PLT 123* 01/29/2014 0931   MCV 96.4 02/27/2014 0923   MCV 91.2 01/29/2014 0931   LYMPHSABS 1.2 02/27/2014 0923   LYMPHSABS 2.1 01/29/2014 0931   MONOABS 0.8 02/27/2014 0923   MONOABS 0.1 01/29/2014 0931   EOSABS 0.0 02/27/2014 0923   EOSABS 0.0 01/29/2014 0931   BASOSABS 0.0 02/27/2014  0923   BASOSABS 0.0 01/29/2014 0931  CMP     Component Value Date/Time   NA 142 02/27/2014 0923   NA 141 01/29/2014 0931   K 3.6 02/27/2014 0923   K 3.0* 01/29/2014 0931   CL 102 01/29/2014 0931   CO2 22 02/27/2014 0923   CO2 25 01/29/2014 0931   GLUCOSE 136 02/27/2014 0923   GLUCOSE 87 01/29/2014 0931   BUN 18.5 02/27/2014 0923   BUN 10 01/29/2014 0931   CREATININE 0.8 02/27/2014 0923   CREATININE 0.71 01/29/2014 0931   CREATININE 0.76 10/18/2013 1438   CALCIUM 8.2* 02/27/2014 0923   CALCIUM 7.8* 01/29/2014 0931   PROT 6.1* 02/27/2014 0923   PROT 6.2 01/23/2014 1327   ALBUMIN 2.9* 02/27/2014 0923   ALBUMIN 3.2* 01/23/2014 1327   AST 10 02/27/2014 0923   AST 16 01/23/2014 1327   ALT 12 02/27/2014 0923   ALT 23 01/23/2014 1327   ALKPHOS 93 02/27/2014 0923   ALKPHOS 72 01/23/2014 1327   BILITOT <0.20 02/27/2014 0923   BILITOT 0.2* 01/23/2014 1327   GFRNONAA >90 01/29/2014 0931   GFRNONAA 87 10/18/2013 1438   GFRAA >90 01/29/2014 0931   GFRAA >89 10/18/2013 1438    Lab Results  Component Value Date/Time   CHOL 149 09/25/2009  9:33 PM    No components found with this basename: hga1c    Lab Results  Component Value Date/Time   AST 10 02/27/2014  9:23 AM   AST 16 01/23/2014  1:27 PM    Assessment and Plan  Chronic obstructive pulmonary disease, unspecified COPD, unspecified chronic bronchitis type Currently on Ventolin and albuterol when necessary, advise patient to quit smoking   Unspecified vitamin D deficiency Advise patient take over-the-counter vitamin D 2000 units daily. Patient has already finished the prescription dose of 50,000 units.  Essential hypertension, benign - Plan: Advised patient for DASH diet, also started on amLODipine (NORVASC) 2.5 MG tablet, she'll come back in 2 weeks for BP check.      Return in about 3 months (around 05/29/2014) for hypertension, COPD, BP check in 2 weeks/Nurse Visit.  Lorayne Marek, MD

## 2014-02-27 NOTE — Progress Notes (Signed)
Patient here for follow up on her COPD

## 2014-02-27 NOTE — Telephone Encounter (Signed)
, °

## 2014-02-27 NOTE — Patient Instructions (Signed)
[facility] Discharge Instructions for Patients Receiving Chemotherapy  Today you received the following chemotherapy agents [place]  To help prevent nausea and vomiting after your treatment, we encourage you to take your nausea medication as needed   If you develop nausea and vomiting that is not controlled by your nausea medication, call the clinic.   BELOW ARE SYMPTOMS THAT SHOULD BE REPORTED IMMEDIATELY:  *FEVER GREATER THAN 100.5 F  *CHILLS WITH OR WITHOUT FEVER  NAUSEA AND VOMITING THAT IS NOT CONTROLLED WITH YOUR NAUSEA MEDICATION  *UNUSUAL SHORTNESS OF BREATH  *UNUSUAL BRUISING OR BLEEDING  TENDERNESS IN MOUTH AND THROAT WITH OR WITHOUT PRESENCE OF ULCERS  *URINARY PROBLEMS  *BOWEL PROBLEMS  UNUSUAL RASH Items with * indicate a potential emergency and should be followed up as soon as possible.  Feel free to call the clinic you have any questions or concerns. The clinic phone number is [phone].    

## 2014-02-27 NOTE — Progress Notes (Signed)
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 The patient then had routine yearly mammography until 10/24/2009.  Sometime in 2014 the patient noted a mass in her right breast. She states she brought this to medical attention several times, although I do not see that reflected in the outside notes we have. In any case on 12/06/2013 the patient presented to her primary clinic with a complaint of left-sided breast pain. Exam showed the right breast however to be larger than the left. There was a thickened area versus lump at the 12:00 position in the right breast above the areola. There was also a mass in the right upper quadrant of the right breast. The patient was referred to the breast Center and on 6:30 at 2015 bilateral diagnostic mammography and right breast ultrasonography at the breast Center showed a 6 cm area in the right breast upper outer quadrant with increased density associated with branching calcifications. There was a separate area more medially that also appeared abnormal. There was diffuse skin thickening and interval enlarged right axillary lymph nodes. On exam the mammographer was able to palpate a 5 cm firm fixed palpable mass in  the upper-outer quadrant of the right breast, but mild skin redness. There was also a palpable right axillary lymph node. Ultrasound confirmed a large irregular hypoechoic mass at the 11:00 position of the right breast measuring 4.1 cm maximally. More laterally a separate mass in the 9:00 position measure 4.4 cm maximally. In the right axilla there were multiple abnormal appearing axillary lymph nodes.  Biopsy of both these suspicious masses in the right breast as well as one of the suspicious lymph nodes 12/21/2013 showed ([id number]) all 3 biopsies do show identical invasive ductal carcinoma, grade 3, triple negative, with an MIB-1 of 85%. On 12/26/2013 the patient underwent bilateral breast MRI. This showed an abnormal area measuring 7.2 cm in the central right breast involving or 4 quadrants and centered superiorly, with diffuse skin thickening as well as level I and 2 metastatic right axillary adenopathy. There was also an enlarged right internal mammary lymph node. The left side was unremarkable.   INTERVAL HISTORY: [name] returns today for follow up of her metastatic breast cancer, accompanied by her daughter, [name]. Today is day 1, cycle 3 of eribulin, given on day 1 and 8 of each 21 day cycle. [name] states she is feeling well today, she has cut down her smoking to 2 cigarettes about every other day now.   REVIEW OF SYSTEMS: [name] denies recent illness, fever, chills, nausea, or vomiting. Her constipation has resolved, and she is taking colace daily with miralax PRN. Her appetite remains unchanged, even as her sense of taste has changed. Her mouth sores have improved with the use of [facility] magic mouthwash. Her photophobic headaches have decreased to about 1 per week now, and  this is controlled with fioricet. Her vision is still blurred at times, but this is unchanged from previous documentation. She now has pain to her bilateral lower legs and she describes this as a dull ache. [name] has  chronic shortness of breath with her COPD, but this is unchanged. She denies chest pain, cough, or fatigue. A detailed review of systems was otherwise negative.  PAST MEDICAL HISTORY: Past Medical History  Diagnosis Date  . Acid reflux   . Asthma   . COPD (chronic obstructive pulmonary disease)   . Cancer     lumpectomy, radiation 2009  . Seasonal allergies   . Alcohol abuse   . Elevated d-dimer     negative chest CT  . Allergy   . Emphysema of lung   . Hot flashes   . Reading disorder   . Impaired writing skills   . Wears glasses   . Wears dentures     top  . HOH (hard of hearing)     left ear  . S/P radiation therapy 01/13/14    SRS left parietal 20Gy    PAST SURGICAL HISTORY: Past Surgical History  Procedure Laterality Date  . Breast lumpectomy    . Abdominal hysterectomy    . Portacath placement Left 12/30/2013    Procedure: INSERTION PORT-A-CATH;  Surgeon: Merrie Roof, MD;  Location: [facility];  Service: General;  Laterality: Left;  subclavian area    FAMILY HISTORY Family History  Problem Relation Age of Onset  . Hypertension Other    the patient has little information about her father or his side of the family. The patient's mother died at the age of 20. The patient had 2 brothers and 4 sisters. There is no history of breast or ovarian cancer in the family to her knowledge.  GYNECOLOGIC HISTORY:  No LMP recorded. Patient has had a hysterectomy. The patient does not remember when she started menstruating. First live birth age 58. She is GX P1. She underwent hysterectomy approximately 12 years ago, but does not know whether she had bilateral salpingo-oophorectomy. 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 She is married but separated from her husband Tamecka Milham, who lives in [place]. The patient lives by herself, with no pets. Her daughter [name] works as a Musician for [employer] care in [place] the patient has 3 grandchildren and 2 great-grandchildren. She is a Psychologist, forensic.    ADVANCED DIRECTIVES: Not in place; at her 12/28/2013 visit the patient was given the appropriate documents to complete and notarize so she may declare a healthcare power of attorney at her discretion   HEALTH MAINTENANCE: History  Substance Use Topics  . Smoking status: Current Every Day Smoker -- 1.00 packs/day for 30 years    Types: Cigarettes  . Smokeless tobacco: Not on file     Comment: down to 1-2 cigarettes daily6  . Alcohol Use: Yes     Comment: occ beer     Colonoscopy: Never  PAP: Status post hysterectomy  Bone density: Never  Lipid panel:  Allergies  Allergen Reactions  . Penicillins Other (See Comments)    Reaction a long time ago    Current Outpatient Prescriptions  Medication Sig Dispense Refill  . albuterol (PROVENTIL HFA;VENTOLIN HFA) 108 (90 BASE) MCG/ACT inhaler Inhale 2 puffs into the lungs every 6 (six) hours as needed for wheezing or shortness of breath. For shortness of  breath  3 Inhaler  3  . butalbital-acetaminophen-caffeine (FIORICET) 50-325-40 MG per tablet Take 1-2 tablets by mouth every 6 (six) hours as needed for headache.  20 tablet  0  . carbamide peroxide (DEBROX) 6.5 % otic solution Place 5 drops into both ears 2 (two) times daily.  15 mL  1  . dexamethasone (DECADRON) 4 MG tablet Take 4 mg by mouth 2 (two) times daily.      . Diphenhyd-Hydrocort-Nystatin (FIRST-DUKES MOUTHWASH) SUSP Use as directed 5 mLs in the mouth or throat 4 (four) times daily. Swish, gargle and spit one to two teaspoonfuls every six hours as needed. May be swallowed if esophageal involvement.  120 mL  1  . fluticasone (FLOVENT HFA) 220 MCG/ACT inhaler Inhale 1 puff into the lungs 2 (two) times daily.  3 Inhaler  3  . hydrocortisone (ANUSOL-HC) 25 MG suppository Place 25 mg rectally 2 (two) times daily as needed.      .  lidocaine-prilocaine (EMLA) cream Apply 1 application topically as needed. Apply over port area 1-2 hours before chemo, cover with plastic wrap  30 g  0  . LORazepam (ATIVAN) 0.5 MG tablet Take 1 tablet (0.5 mg total) by mouth every 8 (eight) hours.  30 tablet  0  . ondansetron (ZOFRAN) 8 MG tablet Take 1 tablet (8 mg total) by mouth 2 (two) times daily. Start the day after chemo for 2 days. Then take as needed for nausea or vomiting.  30 tablet  1  . oxyCODONE-acetaminophen (ROXICET) 5-325 MG per tablet Take 1-2 tablets by mouth every 4 (four) hours as needed.  50 tablet  0  . pantoprazole (PROTONIX) 40 MG tablet Take 1 tablet (40 mg total) by mouth daily.  30 tablet  2  . prochlorperazine (COMPAZINE) 10 MG tablet Take 1 tablet (10 mg total) by mouth every 6 (six) hours as needed (Nausea or vomiting).  30 tablet  1   No current facility-administered medications for this visit.    OBJECTIVE: Middle-aged [place] American woman in no acute distress Filed Vitals:   02/27/14 0939  BP: 165/68  Pulse: 78  Temp: 98.6 F (37 C)  Resp: 20     Body mass index is 24.63 kg/(m^2).    ECOG FS:1 - Symptomatic but completely ambulatory  Sclerae unicteric, pupils equal and reactive Oropharynx clear and moist-- no thrush No cervical or supraclavicular adenopathy Lungs no rales or rhonchi Heart regular rate and rhythm Abd soft, nontender, positive bowel sounds MSK no focal spinal tenderness, no upper extremity lymphedema Neuro: nonfocal, well oriented, appropriate affect Breasts: deferred  LAB RESULTS:  Lab Results  Component Value Date   WBC 14.4* 02/27/2014   HGB 10.6* 02/27/2014   HCT 32.5* 02/27/2014   MCV 96.4 02/27/2014   PLT 334 02/27/2014     Chemistry      Component Value Date/Time   NA 142 02/27/2014 0923   NA 141 01/29/2014 0931   K 3.6 02/27/2014 0923   K 3.0* 01/29/2014 0931   CL 102 01/29/2014 0931   CO2 22 02/27/2014 0923   CO2 25 01/29/2014 0931   BUN 18.5 02/27/2014 0923   BUN 10  01/29/2014 0931   CREATININE 0.8 02/27/2014 0923   CREATININE 0.71 01/29/2014 0931   CREATININE 0.76 10/18/2013 1438      Component Value Date/Time   CALCIUM 8.2* 02/27/2014 0923   CALCIUM 7.8* 01/29/2014 0931   ALKPHOS 93 02/27/2014 0923   ALKPHOS 72 01/23/2014 1327   AST  10 02/27/2014 0923   AST 16 01/23/2014 1327   ALT 12 02/27/2014 0923   ALT 23 01/23/2014 1327   BILITOT <0.20 02/27/2014 0923   BILITOT 0.2* 01/23/2014 1327      STUDIES: Ct Head Wo Contrast  01/29/2014   CLINICAL DATA:  Headache.  Breast cancer.  EXAM: CT HEAD WITHOUT CONTRAST  TECHNIQUE: Contiguous axial images were obtained from the base of the skull through the vertex without intravenous contrast.  COMPARISON:  01/06/2014  FINDINGS: Stable left parietal vasogenic edema from the known intra-axial mass. No new mass effect, midline shift, or acute hemorrhage.  IMPRESSION: Stable left parietal vasogenic edema.  No new abnormality.   Electronically Signed   By: Maryclare Bean M.D.   On: 01/29/2014 09:56   ASSESSMENT: 58 y.o. [place] woman with stage IV breast cancer involving brain, lung, and bones   (1) status post right breast biopsy of 2 separate masses in the right axillary lymph node 12/21/2013 showing a clinical T3 N3, stage IIIC invasive ductal carcinoma, triple negative, with an MIB-1 of 85%; staging studies 01/04/2014 show metastatic disease to the brain, lung, and bones.  (2) status post stereotactic radiosurgery to a 1.7 cm left parietal lesion: 20 Gy given 01/13/2014  (3) systemic therapy consists of neoadjuvant eribulin, given days 1 and 8 of each 21 day cycle  (4) zolendronate to be started once the patient's dental condition has been optimized. I am arranging for her to see a dentist.  (5) right mastectomy for local control to be considered once systemic disease comes under good control  (6) also status post left lumpectomy and sentinel lymph node sampling 11/23/2007 for a pTis pN0, stage 0 ductal carcinoma in situ,  high-grade, estrogen and progesterone receptor negative, status post adjuvant radiation  PLAN: Joyelle is doing well today. The labs were reviewed in detail and were relatively stable. There is an uptick in her WBC noted, but she denies fevers, chills, or any evidence of a bacterial infection. We will continue to monitor her for any symptoms. She is a smoker with a documented history of COPD and this may be the cause.   We will proceed with day 1, cycle 3 of the eribulin today. She will return for labs and an office visit prior to the start of day 8 next week. Restaging will occur after the 4th cycle is completed. Zolendronate will be added when the patient's dental condition improves.   Ammi and her daughter understand and agree with the plan. She has been encouraged to call with any issues that arise before her next visit.  Marcelino Duster, NP  02/27/2014 10:10 AM

## 2014-02-27 NOTE — Progress Notes (Addendum)
Follow up s/p SRS brain left parietal, patient alert,oriented x3, no c/o painnow but over the weekend pain in her arms and legs "Hard pain" stated, still takes decadron 4mg  bid, looks like start of thrush back of tongue, food has no taste stated patient, but eats all day long:", still using [facility] mouthwash daily,   Has chemotherapy infusion today and labs, still has blurry vision but that has been that way all along states patient ,  No energy at times, still smoking 1-2 cigarettes daily, nocturia q hour for 1 month now stated, no dysuria though, loses balance at home seomtimes, has cane,not using 8:41 AM

## 2014-02-27 NOTE — Progress Notes (Signed)
Radiation Oncology         [phone]) [phone] ________________________________  Name: [name] MRN: [id number]  Date: 02/27/2014  DOB: [birth year]  Follow-Up Visit Note  CC: Lorayne Marek, MD  Lorayne Marek, MD  Diagnosis:   Metastatic breast cancer with brain metastasis  Interval Since Last Radiation:  6 weeks   Narrative:  The patient returns today for routine follow-up.  The patient states she is doing reasonably well. She is continuing with palliative chemotherapy. She states that this has gone fairly well. The patient was having some headaches several weeks ago and had a head CT scan which did not reveal any new abnormalities. She denies any severe ongoing headaches recently. The patient does complain of some decreased taste. She is taking Dukes mouthwash on a daily basis. The patient has increased urinary frequency. No complaints of dysuria, hematuria.                              ALLERGIES:  is allergic to penicillins.  Meds: Current Outpatient Prescriptions  Medication Sig Dispense Refill  . albuterol (PROVENTIL HFA;VENTOLIN HFA) 108 (90 BASE) MCG/ACT inhaler Inhale 2 puffs into the lungs every 6 (six) hours as needed for wheezing or shortness of breath. For shortness of breath  3 Inhaler  3  . butalbital-acetaminophen-caffeine (FIORICET) 50-325-40 MG per tablet Take 1-2 tablets by mouth every 6 (six) hours as needed for headache.  20 tablet  0  . carbamide peroxide (DEBROX) 6.5 % otic solution Place 5 drops into both ears 2 (two) times daily.  15 mL  1  . dexamethasone (DECADRON) 4 MG tablet Take 4 mg by mouth 2 (two) times daily.      . Diphenhyd-Hydrocort-Nystatin (FIRST-DUKES MOUTHWASH) SUSP Use as directed 5 mLs in the mouth or throat 4 (four) times daily. Swish, gargle and spit one to two teaspoonfuls every six hours as needed. May be swallowed if esophageal involvement.  120 mL  1  . fluticasone (FLOVENT HFA) 220 MCG/ACT inhaler Inhale 1 puff into the lungs 2 (two) times  daily.  3 Inhaler  3  . hydrocortisone (ANUSOL-HC) 25 MG suppository Place 25 mg rectally 2 (two) times daily as needed.      . lidocaine-prilocaine (EMLA) cream Apply 1 application topically as needed. Apply over port area 1-2 hours before chemo, cover with plastic wrap  30 g  0  . LORazepam (ATIVAN) 0.5 MG tablet Take 1 tablet (0.5 mg total) by mouth every 8 (eight) hours.  30 tablet  0  . ondansetron (ZOFRAN) 8 MG tablet Take 1 tablet (8 mg total) by mouth 2 (two) times daily. Start the day after chemo for 2 days. Then take as needed for nausea or vomiting.  30 tablet  1  . oxyCODONE-acetaminophen (ROXICET) 5-325 MG per tablet Take 1-2 tablets by mouth every 4 (four) hours as needed.  50 tablet  0  . pantoprazole (PROTONIX) 40 MG tablet Take 1 tablet (40 mg total) by mouth daily.  30 tablet  2  . prochlorperazine (COMPAZINE) 10 MG tablet Take 1 tablet (10 mg total) by mouth every 6 (six) hours as needed (Nausea or vomiting).  30 tablet  1   No current facility-administered medications for this encounter.    Physical Findings: The patient is in no acute distress. Patient is alert and oriented.  height is 4\' 11"  (1.499 m) and weight is 123 lb 4.8 oz (55.929 kg). Her oral temperature  is 98.1 F (36.7 C). Her blood pressure is 165/73 and her pulse is 89. Her respiration is 20 and oxygen saturation is 98%. .     Lab Findings: Lab Results  Component Value Date   WBC 9.7 02/13/2014   HGB 9.7* 02/13/2014   HCT 29.4* 02/13/2014   MCV 97.6 02/13/2014   PLT 345 02/13/2014     Radiographic Findings: Ct Head Wo Contrast  01/29/2014   CLINICAL DATA:  Headache.  Breast cancer.  EXAM: CT HEAD WITHOUT CONTRAST  TECHNIQUE: Contiguous axial images were obtained from the base of the skull through the vertex without intravenous contrast.  COMPARISON:  01/06/2014  FINDINGS: Stable left parietal vasogenic edema from the known intra-axial mass. No new mass effect, midline shift, or acute hemorrhage.   IMPRESSION: Stable left parietal vasogenic edema.  No new abnormality.   Electronically Signed   By: Maryclare Bean M.D.   On: 01/29/2014 09:56    Impression:    The patient is doing satisfactorily 6 weeks after her course of radiosurgery. The patient was given instructions regarding tapering her Decadron over the next week and a half.   Plan:  We will continue routine followup with a brain MRI scan 3 months after her radiosurgery.    Jodelle Gross, M.D., Ph.D.

## 2014-03-02 ENCOUNTER — Other Ambulatory Visit: Payer: Self-pay | Admitting: Emergency Medicine

## 2014-03-02 MED ORDER — OXYCODONE-ACETAMINOPHEN 5-325 MG PO TABS: 1.0000 | ORAL_TABLET | ORAL | Status: DC | PRN

## 2014-03-06 ENCOUNTER — Ambulatory Visit (HOSPITAL_BASED_OUTPATIENT_CLINIC_OR_DEPARTMENT_OTHER): Payer: Medicaid Other

## 2014-03-06 ENCOUNTER — Other Ambulatory Visit (HOSPITAL_BASED_OUTPATIENT_CLINIC_OR_DEPARTMENT_OTHER): Payer: Medicaid Other

## 2014-03-06 ENCOUNTER — Other Ambulatory Visit: Payer: Self-pay | Admitting: *Deleted

## 2014-03-06 ENCOUNTER — Encounter: Payer: Self-pay | Admitting: Nurse Practitioner

## 2014-03-06 ENCOUNTER — Telehealth: Payer: Self-pay | Admitting: Oncology

## 2014-03-06 ENCOUNTER — Ambulatory Visit (HOSPITAL_BASED_OUTPATIENT_CLINIC_OR_DEPARTMENT_OTHER): Payer: Medicaid Other | Admitting: Nurse Practitioner

## 2014-03-06 VITALS — BP 148/60 | HR 95 | Temp 98.5°F | Resp 18 | Ht 59.0 in | Wt 123.4 lb

## 2014-03-06 DIAGNOSIS — C7931 Secondary malignant neoplasm of brain: Secondary | ICD-10-CM

## 2014-03-06 DIAGNOSIS — C7949 Secondary malignant neoplasm of other parts of nervous system: Secondary | ICD-10-CM

## 2014-03-06 DIAGNOSIS — C50919 Malignant neoplasm of unspecified site of unspecified female breast: Secondary | ICD-10-CM

## 2014-03-06 DIAGNOSIS — C7951 Secondary malignant neoplasm of bone: Principal | ICD-10-CM

## 2014-03-06 DIAGNOSIS — C773 Secondary and unspecified malignant neoplasm of axilla and upper limb lymph nodes: Secondary | ICD-10-CM

## 2014-03-06 DIAGNOSIS — M7989 Other specified soft tissue disorders: Secondary | ICD-10-CM

## 2014-03-06 DIAGNOSIS — C50911 Malignant neoplasm of unspecified site of right female breast: Secondary | ICD-10-CM

## 2014-03-06 DIAGNOSIS — C7800 Secondary malignant neoplasm of unspecified lung: Secondary | ICD-10-CM

## 2014-03-06 DIAGNOSIS — C50411 Malignant neoplasm of upper-outer quadrant of right female breast: Secondary | ICD-10-CM

## 2014-03-06 DIAGNOSIS — M25472 Effusion, left ankle: Secondary | ICD-10-CM

## 2014-03-06 DIAGNOSIS — N390 Urinary tract infection, site not specified: Secondary | ICD-10-CM

## 2014-03-06 DIAGNOSIS — D72829 Elevated white blood cell count, unspecified: Secondary | ICD-10-CM

## 2014-03-06 DIAGNOSIS — C50419 Malignant neoplasm of upper-outer quadrant of unspecified female breast: Secondary | ICD-10-CM

## 2014-03-06 DIAGNOSIS — F17200 Nicotine dependence, unspecified, uncomplicated: Secondary | ICD-10-CM

## 2014-03-06 DIAGNOSIS — M25471 Effusion, right ankle: Secondary | ICD-10-CM | POA: Insufficient documentation

## 2014-03-06 LAB — COMPREHENSIVE METABOLIC PANEL (CC13)
ALBUMIN: 3.2 g/dL — AB (ref 3.5–5.0)
ALK PHOS: 107 U/L (ref 40–150)
ALT: 10 U/L (ref 0–55)
AST: 6 U/L (ref 5–34)
Anion Gap: 13 mEq/L — ABNORMAL HIGH (ref 3–11)
BUN: 14.8 mg/dL (ref 7.0–26.0)
CO2: 22 mEq/L (ref 22–29)
Calcium: 8.4 mg/dL (ref 8.4–10.4)
Chloride: 109 mEq/L (ref 98–109)
Creatinine: 0.7 mg/dL (ref 0.6–1.1)
Glucose: 117 mg/dl (ref 70–140)
POTASSIUM: 4.3 meq/L (ref 3.5–5.1)
Sodium: 144 mEq/L (ref 136–145)
Total Protein: 6 g/dL — ABNORMAL LOW (ref 6.4–8.3)

## 2014-03-06 LAB — CBC WITH DIFFERENTIAL/PLATELET
BASO%: 0.6 % (ref 0.0–2.0)
Basophils Absolute: 0.1 10*3/uL (ref 0.0–0.1)
EOS%: 0.1 % (ref 0.0–7.0)
Eosinophils Absolute: 0 10*3/uL (ref 0.0–0.5)
HCT: 30.8 % — ABNORMAL LOW (ref 34.8–46.6)
HGB: 9.8 g/dL — ABNORMAL LOW (ref 11.6–15.9)
LYMPH%: 9.2 % — AB (ref 14.0–49.7)
MCH: 31.5 pg (ref 25.1–34.0)
MCHC: 31.8 g/dL (ref 31.5–36.0)
MCV: 99 fL (ref 79.5–101.0)
MONO#: 0.9 10*3/uL (ref 0.1–0.9)
MONO%: 4.8 % (ref 0.0–14.0)
NEUT#: 14.9 10*3/uL — ABNORMAL HIGH (ref 1.5–6.5)
NEUT%: 85.3 % — ABNORMAL HIGH (ref 38.4–76.8)
PLATELETS: 250 10*3/uL (ref 145–400)
RBC: 3.11 10*6/uL — AB (ref 3.70–5.45)
RDW: 19.7 % — ABNORMAL HIGH (ref 11.2–14.5)
WBC: 17.5 10*3/uL — ABNORMAL HIGH (ref 3.9–10.3)
lymph#: 1.6 10*3/uL (ref 0.9–3.3)

## 2014-03-06 LAB — URINALYSIS, MICROSCOPIC - CHCC
Bilirubin (Urine): NEGATIVE
Blood: NEGATIVE
Glucose: NEGATIVE mg/dL
Ketones: NEGATIVE mg/dL
Leukocyte Esterase: NEGATIVE
NITRITE: NEGATIVE
PROTEIN: NEGATIVE mg/dL
Specific Gravity, Urine: 1.025 (ref 1.003–1.035)
UROBILINOGEN UR: 0.2 mg/dL (ref 0.2–1)
pH: 6.5 (ref 4.6–8.0)

## 2014-03-06 LAB — TECHNOLOGIST REVIEW

## 2014-03-06 MED ORDER — ONDANSETRON 8 MG/50ML IVPB (CHCC)
8.0000 mg | Freq: Once | INTRAVENOUS | Status: AC
  Administered 2014-03-06: 8 mg via INTRAVENOUS

## 2014-03-06 MED ORDER — HEPARIN SOD (PORK) LOCK FLUSH 100 UNIT/ML IV SOLN
500.0000 [IU] | Freq: Once | INTRAVENOUS | Status: AC | PRN
  Administered 2014-03-06: 500 [IU]
  Filled 2014-03-06: qty 5

## 2014-03-06 MED ORDER — SODIUM CHLORIDE 0.9 % IJ SOLN
10.0000 mL | INTRAMUSCULAR | Status: DC | PRN
  Administered 2014-03-06: 10 mL
  Filled 2014-03-06: qty 10

## 2014-03-06 MED ORDER — OXYCODONE-ACETAMINOPHEN 5-325 MG PO TABS
1.0000 | ORAL_TABLET | ORAL | Status: DC | PRN
Start: 2014-03-06 — End: 2014-04-18

## 2014-03-06 MED ORDER — SODIUM CHLORIDE 0.9 % IV SOLN
1.4000 mg/m2 | Freq: Once | INTRAVENOUS | Status: AC
  Administered 2014-03-06: 2 mg via INTRAVENOUS
  Filled 2014-03-06: qty 4

## 2014-03-06 MED ORDER — ONDANSETRON 8 MG/NS 50 ML IVPB
INTRAVENOUS | Status: AC
  Filled 2014-03-06: qty 8

## 2014-03-06 MED ORDER — LORAZEPAM 0.5 MG PO TABS: 0.5000 mg | ORAL_TABLET | Freq: Three times a day (TID) | ORAL | Status: DC

## 2014-03-06 MED ORDER — DEXAMETHASONE SODIUM PHOSPHATE 10 MG/ML IJ SOLN
INTRAMUSCULAR | Status: AC
  Filled 2014-03-06: qty 1

## 2014-03-06 MED ORDER — SODIUM CHLORIDE 0.9 % IV SOLN
Freq: Once | INTRAVENOUS | Status: AC
  Administered 2014-03-06: 12:00:00 via INTRAVENOUS

## 2014-03-06 MED ORDER — DEXAMETHASONE SODIUM PHOSPHATE 10 MG/ML IJ SOLN
10.0000 mg | Freq: Once | INTRAMUSCULAR | Status: AC
  Administered 2014-03-06: 10 mg via INTRAVENOUS

## 2014-03-06 NOTE — Telephone Encounter (Signed)
per pof to sch lab add on-pt has copy yof sch-sent to lab

## 2014-03-06 NOTE — Patient Instructions (Signed)
[facility] Discharge Instructions for Patients Receiving Chemotherapy  Today you received the following chemotherapy agents halaven   To help prevent nausea and vomiting after your treatment, we encourage you to take your nausea medication as directed. If you develop nausea and vomiting that is not controlled by your nausea medication, call the clinic.   BELOW ARE SYMPTOMS THAT SHOULD BE REPORTED IMMEDIATELY:  *FEVER GREATER THAN 100.5 F  *CHILLS WITH OR WITHOUT FEVER  NAUSEA AND VOMITING THAT IS NOT CONTROLLED WITH YOUR NAUSEA MEDICATION  *UNUSUAL SHORTNESS OF BREATH  *UNUSUAL BRUISING OR BLEEDING  TENDERNESS IN MOUTH AND THROAT WITH OR WITHOUT PRESENCE OF ULCERS  *URINARY PROBLEMS  *BOWEL PROBLEMS  UNUSUAL RASH Items with * indicate a potential emergency and should be followed up as soon as possible.  Feel free to call the clinic you have any questions or concerns. The clinic phone number is [phone].  

## 2014-03-06 NOTE — Progress Notes (Signed)
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 The patient then had routine yearly mammography until 10/24/2009.  Sometime in 2014 the patient noted a mass in her right breast. She states she brought this to medical attention several times, although I do not see that reflected in the outside notes we have. In any case on 12/06/2013 the patient presented to her primary clinic with a complaint of left-sided breast pain. Exam showed the right breast however to be larger than the left. There was a thickened area versus lump at the 12:00 position in the right breast above the areola. There was also a mass in the right upper quadrant of the right breast. The patient was referred to the breast Center and on 6:30 at 2015 bilateral diagnostic mammography and right breast ultrasonography at the breast Center showed a 6 cm area in the right breast upper outer quadrant with increased density associated with branching calcifications. There was a separate area more medially that also appeared abnormal. There was diffuse skin thickening and interval enlarged right axillary lymph nodes. On exam the mammographer was able to palpate a 5 cm firm fixed palpable mass in  the upper-outer quadrant of the right breast, but mild skin redness. There was also a palpable right axillary lymph node. Ultrasound confirmed a large irregular hypoechoic mass at the 11:00 position of the right breast measuring 4.1 cm maximally. More laterally a separate mass in the 9:00 position measure 4.4 cm maximally. In the right axilla there were multiple abnormal appearing axillary lymph nodes.  Biopsy of both these suspicious masses in the right breast as well as one of the suspicious lymph nodes 12/21/2013 showed ([id number]) all 3 biopsies do show identical invasive ductal carcinoma, grade 3, triple negative, with an MIB-1 of 85%. On 12/26/2013 the patient underwent bilateral breast MRI. This showed an abnormal area measuring 7.2 cm in the central right breast involving or 4 quadrants and centered superiorly, with diffuse skin thickening as well as level I and 2 metastatic right axillary adenopathy. There was also an enlarged right internal mammary lymph node. The left side was unremarkable.   INTERVAL HISTORY: [name] today for follow up of her metastatic breast cancer, accompanied by her daughter, [name]. Today is day 8, cycle 3 of eribulin, given on day 1 and 8 of each 21 day cycle. She was started on amlodipine 2.5mg  last week by her PCP and is already seeing better blood pressures. This past week has been stressful for personal reasons, so her smoking has increased to 1 pack every day and a half. She has also noticed that her ankles have been swelling for about the past month.   REVIEW OF SYSTEMS: [name] denies recent illness, fever, chills, nausea, vomiting, or changes in bowel or bladder habits. Her appetite remains unchanged, even as her sense of  taste has changed. Her photophobic headaches occur about once per week now, and this is  controlled with fioricet. Her vision is still blurred at times, but this is unchanged from previous documentation. [name] has chronic shortness of  breath with her COPD, but this is unchanged. She denies chest pain, cough, or fatigue. A detailed review of systems was otherwise negative.  PAST MEDICAL HISTORY: Past Medical History  Diagnosis Date  . Acid reflux   . Asthma   . COPD (chronic obstructive pulmonary disease)   . Cancer     lumpectomy, radiation 2009  . Seasonal allergies   . Alcohol abuse   . Elevated d-dimer     negative chest CT  . Allergy   . Emphysema of lung   . Hot flashes   . Reading disorder   . Impaired writing skills   . Wears glasses   . Wears dentures     top  . HOH (hard of hearing)     left ear  . S/P radiation therapy 01/13/14    SRS left parietal 20Gy    PAST SURGICAL HISTORY: Past Surgical History  Procedure Laterality Date  . Breast lumpectomy    . Abdominal hysterectomy    . Portacath placement Left 12/30/2013    Procedure: INSERTION PORT-A-CATH;  Surgeon: Merrie Roof, MD;  Location: [facility];  Service: General;  Laterality: Left;  subclavian area    FAMILY HISTORY Family History  Problem Relation Age of Onset  . Hypertension Other    the patient has little information about her father or his side of the family. The patient's mother died at the age of 84. The patient had 2 brothers and 4 sisters. There is no history of breast or ovarian cancer in the family to her knowledge.  GYNECOLOGIC HISTORY:  No LMP recorded. Patient has had a hysterectomy. The patient does not remember when she started menstruating. First live birth age 77. She is GX P1. She underwent hysterectomy approximately 12 years ago, but does not know whether she had bilateral salpingo-oophorectomy. 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 She is married but separated from her husband [name], who lives in [place]. The patient lives by herself, with no pets. Her daughter [name] works as a Stage manager  for [employer] care in [place] the patient has 3 grandchildren and 2 great-grandchildren. She is a Psychologist, forensic.    ADVANCED DIRECTIVES: Not in place; at her 12/28/2013 visit the patient was given the appropriate documents to complete and notarize so she may declare a healthcare power of attorney at her discretion   HEALTH MAINTENANCE: History  Substance Use Topics  . Smoking status: Current Every Day Smoker -- 1.00 packs/day for 30 years    Types: Cigarettes  . Smokeless tobacco: Not on file     Comment: down to 1-2 cigarettes daily6  . Alcohol Use: Yes     Comment: occ beer     Colonoscopy: Never  PAP: Status post hysterectomy  Bone density: Never  Lipid panel:  Allergies  Allergen Reactions  . Penicillins Other (See Comments)    Reaction a long time ago    Current Outpatient Prescriptions  Medication Sig Dispense Refill  . albuterol (PROVENTIL HFA;VENTOLIN HFA) 108 (90 BASE) MCG/ACT inhaler Inhale 2 puffs into the lungs every 6 (six) hours as needed for wheezing or shortness of breath. For shortness of breath  3  Inhaler  3  . amLODipine (NORVASC) 2.5 MG tablet Take 1 tablet (2.5 mg total) by mouth daily.  90 tablet  3  . carbamide peroxide (DEBROX) 6.5 % otic solution Place 5 drops into both ears 2 (two) times daily.  15 mL  1  . dexamethasone (DECADRON) 4 MG tablet Take 2 mg by mouth daily.       . fluticasone (FLOVENT HFA) 220 MCG/ACT inhaler Inhale 1 puff into the lungs 2 (two) times daily.  3 Inhaler  3  . lidocaine-prilocaine (EMLA) cream Apply 1 application topically as needed. Apply over port area 1-2 hours before chemo, cover with plastic wrap  30 g  0  . LORazepam (ATIVAN) 0.5 MG tablet Take 1 tablet (0.5 mg total) by mouth every 8 (eight) hours.  30 tablet  0  . oxyCODONE-acetaminophen (ROXICET) 5-325 MG per tablet Take 1-2 tablets by mouth every 4 (four) hours as needed.  50 tablet  0  . pantoprazole (PROTONIX) 40 MG tablet Take 1 tablet (40 mg total) by mouth  daily.  30 tablet  2  . prochlorperazine (COMPAZINE) 10 MG tablet Take 1 tablet (10 mg total) by mouth every 6 (six) hours as needed (Nausea or vomiting).  30 tablet  1  . butalbital-acetaminophen-caffeine (FIORICET) 50-325-40 MG per tablet Take 1-2 tablets by mouth every 6 (six) hours as needed for headache.  20 tablet  0  . Diphenhyd-Hydrocort-Nystatin (FIRST-DUKES MOUTHWASH) SUSP Use as directed 5 mLs in the mouth or throat 4 (four) times daily. Swish, gargle and spit one to two teaspoonfuls every six hours as needed. May be swallowed if esophageal involvement.  120 mL  1  . hydrocortisone (ANUSOL-HC) 25 MG suppository Place 25 mg rectally 2 (two) times daily as needed.      . ondansetron (ZOFRAN) 8 MG tablet Take 1 tablet (8 mg total) by mouth 2 (two) times daily. Start the day after chemo for 2 days. Then take as needed for nausea or vomiting.  30 tablet  1   No current facility-administered medications for this visit.    OBJECTIVE: Middle-aged [place] American woman in no acute distress Filed Vitals:   03/06/14 1052  BP: 148/60  Pulse: 95  Temp: 98.5 F (36.9 C)  Resp: 18     Body mass index is 24.91 kg/(m^2).    ECOG FS:1 - Symptomatic but completely ambulatory  Skin: warm, dry, hyperpigmented nail beds and palms HEENT: sclerae anicteric, conjunctivae pink, oropharynx clear. No thrush or mucositis.  Lymph Nodes: No cervical or supraclavicular lymphadenopathy  Lungs: clear to auscultation bilaterally, no rales, wheezes, or rhonci  Heart: regular rate and rhythm  Abdomen: round, soft, non tender, positive bowel sounds  Musculoskeletal: No focal spinal tenderness, 1-2+ pitting edema in ankles, no swelling in bilateral lower extremities otherwise  Neuro: non focal, well oriented, positive affect  Breast: deferred  LAB RESULTS:  Lab Results  Component Value Date   WBC 17.5* 03/06/2014   HGB 9.8* 03/06/2014   HCT 30.8* 03/06/2014   MCV 99.0 03/06/2014   PLT 250 03/06/2014      Chemistry      Component Value Date/Time   NA 144 03/06/2014 1019   NA 141 01/29/2014 0931   K 4.3 03/06/2014 1019   K 3.0* 01/29/2014 0931   CL 102 01/29/2014 0931   CO2 22 03/06/2014 1019   CO2 25 01/29/2014 0931   BUN 14.8 03/06/2014 1019   BUN 10 01/29/2014 0931   CREATININE 0.7 03/06/2014  1019   CREATININE 0.71 01/29/2014 0931   CREATININE 0.76 10/18/2013 1438      Component Value Date/Time   CALCIUM 8.4 03/06/2014 1019   CALCIUM 7.8* 01/29/2014 0931   ALKPHOS 107 03/06/2014 1019   ALKPHOS 72 01/23/2014 1327   AST 6 03/06/2014 1019   AST 16 01/23/2014 1327   ALT 10 03/06/2014 1019   ALT 23 01/23/2014 1327   BILITOT <0.20 03/06/2014 1019   BILITOT 0.2* 01/23/2014 1327      STUDIES: No results found.  ASSESSMENT: 58 y.o. [place] woman with stage IV breast cancer involving brain, lung, and bones   (1) status post right breast biopsy of 2 separate masses in the right axillary lymph node 12/21/2013 showing a clinical T3 N3, stage IIIC invasive ductal carcinoma, triple negative, with an MIB-1 of 85%; staging studies 01/04/2014 show metastatic disease to the brain, lung, and bones.  (2) status post stereotactic radiosurgery to a 1.7 cm left parietal lesion: 20 Gy given 01/13/2014  (3) systemic therapy consists of neoadjuvant eribulin, given days 1 and 8 of each 21 day cycle  (4) zolendronate to be started once the patient's dental condition has been optimized. I am arranging for her to see a dentist.  (5) right mastectomy for local control to be considered once systemic disease comes under good control  (6) also status post left lumpectomy and sentinel lymph node sampling 11/23/2007 for a pTis pN0, stage 0 ductal carcinoma in situ, high-grade, estrogen and progesterone receptor negative, status post adjuvant radiation  PLAN: Ajaya is feeling well today. The labs were reviewed in detail with her and again a rise in her WBC count was present. She denies fevers, chills, or any evidence of a  bacterial infection otherwise. She is a smoker with a documented history of COPD, so this may be the cause. She is decreasing her steroid use at home, as she has no nausea with treatment. We will collect a urine sample today to rule out asymptomatic UTI. I counciled the patient about quitting smoking, and detailed the habit's negative effect on her health. She states that she will try again, and had found success with "rationing" out the number of cigarettes she would have per day.  Layci's ankles have been swollen off and on for the past month. We discussed her diet, which historically has a heavy sodium content. I advised her to go on a sodium restricted diet, opting for fresh vegetables over the canned variety, eliminating table salt, and switching to sodium free seasonings for her meat whenever possible. She will be sure to keep her legs elevated when not riding in the car or ambulating.   Neelam will proceed with day 8, cycle 3 of the eribulin today. She will return in 2 weeks piror to the start of the 4th cycle. Restaging will occur after that cycle is completed. Oliana and her daughter understand and agree with the plan. She has been encouraged to call with any issues that might arise before her next visit here.   Marcelino Duster, NP  03/06/2014 11:43 AM

## 2014-03-10 ENCOUNTER — Other Ambulatory Visit: Payer: Self-pay | Admitting: *Deleted

## 2014-03-10 DIAGNOSIS — C7949 Secondary malignant neoplasm of other parts of nervous system: Principal | ICD-10-CM

## 2014-03-10 DIAGNOSIS — C7931 Secondary malignant neoplasm of brain: Secondary | ICD-10-CM

## 2014-03-14 ENCOUNTER — Telehealth: Payer: Self-pay

## 2014-03-14 NOTE — Telephone Encounter (Signed)
[facility] - Manuela Schwartz in [place].  LMOV[facility] - pt MRI can be changed to [facility] [place], but date cannot be changed.  Call if there are any questions.

## 2014-03-17 ENCOUNTER — Ambulatory Visit: Payer: Medicaid Other

## 2014-03-20 ENCOUNTER — Ambulatory Visit (HOSPITAL_BASED_OUTPATIENT_CLINIC_OR_DEPARTMENT_OTHER): Payer: Medicaid Other | Admitting: Nurse Practitioner

## 2014-03-20 ENCOUNTER — Encounter: Payer: Self-pay | Admitting: Nurse Practitioner

## 2014-03-20 ENCOUNTER — Other Ambulatory Visit: Payer: Self-pay | Admitting: Oncology

## 2014-03-20 ENCOUNTER — Ambulatory Visit (HOSPITAL_BASED_OUTPATIENT_CLINIC_OR_DEPARTMENT_OTHER): Payer: Medicaid Other

## 2014-03-20 ENCOUNTER — Other Ambulatory Visit (HOSPITAL_BASED_OUTPATIENT_CLINIC_OR_DEPARTMENT_OTHER): Payer: Medicaid Other

## 2014-03-20 VITALS — BP 140/71 | HR 107 | Temp 98.7°F | Resp 18 | Ht 59.0 in | Wt 118.6 lb

## 2014-03-20 DIAGNOSIS — C7951 Secondary malignant neoplasm of bone: Secondary | ICD-10-CM

## 2014-03-20 DIAGNOSIS — C50411 Malignant neoplasm of upper-outer quadrant of right female breast: Secondary | ICD-10-CM

## 2014-03-20 DIAGNOSIS — F17200 Nicotine dependence, unspecified, uncomplicated: Secondary | ICD-10-CM

## 2014-03-20 DIAGNOSIS — C50919 Malignant neoplasm of unspecified site of unspecified female breast: Secondary | ICD-10-CM

## 2014-03-20 DIAGNOSIS — C50911 Malignant neoplasm of unspecified site of right female breast: Secondary | ICD-10-CM

## 2014-03-20 DIAGNOSIS — Z720 Tobacco use: Secondary | ICD-10-CM

## 2014-03-20 DIAGNOSIS — C7800 Secondary malignant neoplasm of unspecified lung: Secondary | ICD-10-CM

## 2014-03-20 DIAGNOSIS — Z5111 Encounter for antineoplastic chemotherapy: Secondary | ICD-10-CM

## 2014-03-20 DIAGNOSIS — K759 Inflammatory liver disease, unspecified: Secondary | ICD-10-CM

## 2014-03-20 DIAGNOSIS — C7931 Secondary malignant neoplasm of brain: Secondary | ICD-10-CM

## 2014-03-20 LAB — COMPREHENSIVE METABOLIC PANEL (CC13)
ALK PHOS: 88 U/L (ref 40–150)
ALT: 19 U/L (ref 0–55)
AST: 35 U/L — AB (ref 5–34)
Albumin: 3.4 g/dL — ABNORMAL LOW (ref 3.5–5.0)
Anion Gap: 12 mEq/L — ABNORMAL HIGH (ref 3–11)
BILIRUBIN TOTAL: 0.2 mg/dL (ref 0.20–1.20)
BUN: 12.1 mg/dL (ref 7.0–26.0)
CO2: 22 mEq/L (ref 22–29)
Calcium: 10 mg/dL (ref 8.4–10.4)
Chloride: 108 mEq/L (ref 98–109)
Creatinine: 0.8 mg/dL (ref 0.6–1.1)
GLUCOSE: 141 mg/dL — AB (ref 70–140)
POTASSIUM: 4 meq/L (ref 3.5–5.1)
Sodium: 141 mEq/L (ref 136–145)
TOTAL PROTEIN: 6.9 g/dL (ref 6.4–8.3)

## 2014-03-20 LAB — CBC WITH DIFFERENTIAL/PLATELET
BASO%: 0.3 % (ref 0.0–2.0)
Basophils Absolute: 0 10*3/uL (ref 0.0–0.1)
EOS%: 0.7 % (ref 0.0–7.0)
Eosinophils Absolute: 0 10*3/uL (ref 0.0–0.5)
HCT: 34.7 % — ABNORMAL LOW (ref 34.8–46.6)
HGB: 11.5 g/dL — ABNORMAL LOW (ref 11.6–15.9)
LYMPH%: 19.7 % (ref 14.0–49.7)
MCH: 31.9 pg (ref 25.1–34.0)
MCHC: 33.1 g/dL (ref 31.5–36.0)
MCV: 96.4 fL (ref 79.5–101.0)
MONO#: 0.6 10*3/uL (ref 0.1–0.9)
MONO%: 9.4 % (ref 0.0–14.0)
NEUT#: 4.3 10*3/uL (ref 1.5–6.5)
NEUT%: 69.9 % (ref 38.4–76.8)
NRBC: 1 % — AB (ref 0–0)
Platelets: 293 10*3/uL (ref 145–400)
RBC: 3.6 10*6/uL — AB (ref 3.70–5.45)
RDW: 18.8 % — ABNORMAL HIGH (ref 11.2–14.5)
WBC: 6.1 10*3/uL (ref 3.9–10.3)
lymph#: 1.2 10*3/uL (ref 0.9–3.3)

## 2014-03-20 LAB — TECHNOLOGIST REVIEW

## 2014-03-20 MED ORDER — SODIUM CHLORIDE 0.9 % IV SOLN
1.4000 mg/m2 | Freq: Once | INTRAVENOUS | Status: AC
  Administered 2014-03-20: 2 mg via INTRAVENOUS
  Filled 2014-03-20: qty 4

## 2014-03-20 MED ORDER — ONDANSETRON 8 MG/50ML IVPB (CHCC)
8.0000 mg | Freq: Once | INTRAVENOUS | Status: AC
  Administered 2014-03-20: 8 mg via INTRAVENOUS

## 2014-03-20 MED ORDER — DEXAMETHASONE SODIUM PHOSPHATE 10 MG/ML IJ SOLN
10.0000 mg | Freq: Once | INTRAMUSCULAR | Status: AC
  Administered 2014-03-20: 10 mg via INTRAVENOUS

## 2014-03-20 MED ORDER — ONDANSETRON 8 MG/NS 50 ML IVPB
INTRAVENOUS | Status: AC
  Filled 2014-03-20: qty 8

## 2014-03-20 MED ORDER — HEPARIN SOD (PORK) LOCK FLUSH 100 UNIT/ML IV SOLN
500.0000 [IU] | Freq: Once | INTRAVENOUS | Status: AC | PRN
  Administered 2014-03-20: 500 [IU]
  Filled 2014-03-20: qty 5

## 2014-03-20 MED ORDER — SODIUM CHLORIDE 0.9 % IJ SOLN
10.0000 mL | INTRAMUSCULAR | Status: DC | PRN
  Administered 2014-03-20: 10 mL
  Filled 2014-03-20: qty 10

## 2014-03-20 MED ORDER — DEXAMETHASONE SODIUM PHOSPHATE 10 MG/ML IJ SOLN
INTRAMUSCULAR | Status: AC
  Filled 2014-03-20: qty 1

## 2014-03-20 MED ORDER — SODIUM CHLORIDE 0.9 % IV SOLN
Freq: Once | INTRAVENOUS | Status: AC
  Administered 2014-03-20: 13:00:00 via INTRAVENOUS

## 2014-03-20 NOTE — Progress Notes (Signed)
Pt saw Nira Conn, NP today prior to chemo.  OK to treat from all lab results today per Dr. Jana Hakim.

## 2014-03-20 NOTE — Addendum Note (Signed)
Addended by: Marcelino Duster on: 03/20/2014 03:48 PM   Modules accepted: Orders

## 2014-03-20 NOTE — Patient Instructions (Signed)
[facility] Discharge Instructions for Patients Receiving Chemotherapy  Today you received the following chemotherapy agents: Halaven  To help prevent nausea and vomiting after your treatment, we encourage you to take your nausea medication as prescribed by your physician.    If you develop nausea and vomiting that is not controlled by your nausea medication, call the clinic.   BELOW ARE SYMPTOMS THAT SHOULD BE REPORTED IMMEDIATELY:  *FEVER GREATER THAN 100.5 F  *CHILLS WITH OR WITHOUT FEVER  NAUSEA AND VOMITING THAT IS NOT CONTROLLED WITH YOUR NAUSEA MEDICATION  *UNUSUAL SHORTNESS OF BREATH  *UNUSUAL BRUISING OR BLEEDING  TENDERNESS IN MOUTH AND THROAT WITH OR WITHOUT PRESENCE OF ULCERS  *URINARY PROBLEMS  *BOWEL PROBLEMS  UNUSUAL RASH Items with * indicate a potential emergency and should be followed up as soon as possible.  Feel free to call the clinic you have any questions or concerns. The clinic phone number is [phone].    

## 2014-03-20 NOTE — Progress Notes (Signed)
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 The patient then had routine yearly mammography until 10/24/2009.  Sometime in 2014 the patient noted a mass in her right breast. She states she brought this to medical attention several times, although I do not see that reflected in the outside notes we have. In any case on 12/06/2013 the patient presented to her primary clinic with a complaint of left-sided breast pain. Exam showed the right breast however to be larger than the left. There was a thickened area versus lump at the 12:00 position in the right breast above the areola. There was also a mass in the right upper quadrant of the right breast. The patient was referred to the breast Center and on 6:30 at 2015 bilateral diagnostic mammography and right breast ultrasonography at the breast Center showed a 6 cm area in the right breast upper outer quadrant with increased density associated with branching calcifications. There was a separate area more medially that also appeared abnormal. There was diffuse skin thickening and interval enlarged right axillary lymph nodes. On exam the mammographer was able to palpate a 5 cm firm fixed palpable mass in  the upper-outer quadrant of the right breast, but mild skin redness. There was also a palpable right axillary lymph node. Ultrasound confirmed a large irregular hypoechoic mass at the 11:00 position of the right breast measuring 4.1 cm maximally. More laterally a separate mass in the 9:00 position measure 4.4 cm maximally. In the right axilla there were multiple abnormal appearing axillary lymph nodes.  Biopsy of both these suspicious masses in the right breast as well as one of the suspicious lymph nodes 12/21/2013 showed ([id number]) all 3 biopsies do show identical invasive ductal carcinoma, grade 3, triple negative, with an MIB-1 of 85%. On 12/26/2013 the patient underwent bilateral breast MRI. This showed an abnormal area measuring 7.2 cm in the central right breast involving or 4 quadrants and centered superiorly, with diffuse skin thickening as well as level I and 2 metastatic right axillary adenopathy. There was also an enlarged right internal mammary lymph node. The left side was unremarkable.   INTERVAL HISTORY: [name] returns today for follow up of her metastatic breast cancer, accompanied by her daughter, [name]. Today is day 1, cycle 4 of eribulin, given on day 1 and 8 of each 21 day cycle. She is feeling well today. Her smoking has decreased all the way down to 1 cigarette per day. Her blood pressure has also improved since her last visit and her bilateral ankle swelling has resolved.   REVIEW OF SYSTEMS: [name] denies fevers, chills, nausea, vomiting, or changes in bowel or bladder habits. She feels some left breast pain when reaching, but no associated swelling. She has infrequent headaches, controlled with fioricet. She has generalized bony aches and pains all over.  Her vision  is blurred at times, but this is unchanged. She has chronic shortness of breath with her COPD, but she denies chest pain, cough, or fatigue. A detailed review of systems is otherwise negative.   PAST MEDICAL  HISTORY: Past Medical History  Diagnosis Date  . Acid reflux   . Asthma   . COPD (chronic obstructive pulmonary disease)   . Cancer     lumpectomy, radiation 2009  . Seasonal allergies   . Alcohol abuse   . Elevated d-dimer     negative chest CT  . Allergy   . Emphysema of lung   . Hot flashes   . Reading disorder   . Impaired writing skills   . Wears glasses   . Wears dentures     top  . HOH (hard of hearing)     left ear  . S/P radiation therapy 01/13/14    SRS left parietal 20Gy    PAST SURGICAL HISTORY: Past Surgical History  Procedure Laterality Date  . Breast lumpectomy    . Abdominal hysterectomy    . Portacath placement Left 12/30/2013    Procedure: INSERTION PORT-A-CATH;  Surgeon: Merrie Roof, MD;  Location: [facility];  Service: General;  Laterality: Left;  subclavian area    FAMILY HISTORY Family History  Problem Relation Age of Onset  . Hypertension Other    the patient has little information about her father or his side of the family. The patient's mother died at the age of 12. The patient had 2 brothers and 4 sisters. There is no history of breast or ovarian cancer in the family to her knowledge.  GYNECOLOGIC HISTORY:  No LMP recorded. Patient has had a hysterectomy. The patient does not remember when she started menstruating. First live birth age 30. She is GX P1. She underwent hysterectomy approximately 12 years ago, but does not know whether she had bilateral salpingo-oophorectomy. 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 She is married but separated from her husband [name], who lives in [place]. The patient lives by herself, with no pets. Her daughter [name] works as a Stage manager for [employer] care in [place] the patient has 3 grandchildren and 2 great-grandchildren. She is a Psychologist, forensic.    ADVANCED DIRECTIVES: Not in place; at  her 12/28/2013 visit the patient was given the appropriate documents to complete and notarize so she may declare a healthcare power of attorney at her discretion   HEALTH MAINTENANCE: History  Substance Use Topics  . Smoking status: Current Every Day Smoker -- 1.00 packs/day for 30 years    Types: Cigarettes  . Smokeless tobacco: Not on file     Comment: down to 1-2 cigarettes daily6  . Alcohol Use: Yes     Comment: occ beer     Colonoscopy: Never  PAP: Status post hysterectomy  Bone density: Never  Lipid panel:  Allergies  Allergen Reactions  . Penicillins Other (See Comments)    Reaction a long time ago    Current Outpatient Prescriptions  Medication Sig Dispense Refill  . albuterol (PROVENTIL HFA;VENTOLIN HFA) 108 (90 BASE) MCG/ACT inhaler Inhale 2 puffs into the lungs every 6 (six) hours as needed for wheezing or shortness of breath. For shortness of breath  3 Inhaler  3  . amLODipine (NORVASC) 2.5 MG tablet Take 1 tablet (2.5 mg total) by mouth daily.  90 tablet  3  . carbamide  peroxide (DEBROX) 6.5 % otic solution Place 5 drops into both ears 2 (two) times daily.  15 mL  1  . dexamethasone (DECADRON) 4 MG tablet Take 2 mg by mouth daily.       . fluticasone (FLOVENT HFA) 220 MCG/ACT inhaler Inhale 1 puff into the lungs 2 (two) times daily.  3 Inhaler  3  . hydrocortisone (ANUSOL-HC) 25 MG suppository Place 25 mg rectally 2 (two) times daily as needed.      . lidocaine-prilocaine (EMLA) cream Apply 1 application topically as needed. Apply over port area 1-2 hours before chemo, cover with plastic wrap  30 g  0  . LORazepam (ATIVAN) 0.5 MG tablet Take 1 tablet (0.5 mg total) by mouth every 8 (eight) hours.  30 tablet  0  . ondansetron (ZOFRAN) 8 MG tablet Take 1 tablet (8 mg total) by mouth 2 (two) times daily. Start the day after chemo for 2 days. Then take as needed for nausea or vomiting.  30 tablet  1  . oxyCODONE-acetaminophen (ROXICET) 5-325 MG per tablet Take 1-2 tablets  by mouth every 4 (four) hours as needed.  50 tablet  0  . pantoprazole (PROTONIX) 40 MG tablet Take 1 tablet (40 mg total) by mouth daily.  30 tablet  2  . butalbital-acetaminophen-caffeine (FIORICET) 50-325-40 MG per tablet Take 1-2 tablets by mouth every 6 (six) hours as needed for headache.  20 tablet  0  . Diphenhyd-Hydrocort-Nystatin (FIRST-DUKES MOUTHWASH) SUSP Use as directed 5 mLs in the mouth or throat 4 (four) times daily. Swish, gargle and spit one to two teaspoonfuls every six hours as needed. May be swallowed if esophageal involvement.  120 mL  1  . prochlorperazine (COMPAZINE) 10 MG tablet Take 1 tablet (10 mg total) by mouth every 6 (six) hours as needed (Nausea or vomiting).  30 tablet  1   No current facility-administered medications for this visit.    OBJECTIVE: Middle-aged [place] American woman in no acute distress Filed Vitals:   03/20/14 1122  BP:   Pulse: 107  Temp:   Resp:      Body mass index is 23.94 kg/(m^2).    ECOG FS:1 - Symptomatic but completely ambulatory  Skin: warm, dry, hyperpigmented nail beds and palms Sclerae unicteric, pupils equal and reactive Oropharynx clear and moist-- no thrush No cervical or supraclavicular adenopathy Lungs no rales or rhonchi Heart regular rate and rhythm Abd soft, nontender, positive bowel sounds MSK no focal spinal tenderness, no upper extremity lymphedema Neuro: nonfocal, well oriented, appropriate affect Breasts: deferred   LAB RESULTS:  Lab Results  Component Value Date   WBC 6.1 03/20/2014   HGB 11.5* 03/20/2014   HCT 34.7* 03/20/2014   MCV 96.4 03/20/2014   PLT 293 03/20/2014     Chemistry      Component Value Date/Time   NA 141 03/20/2014 1101   NA 141 01/29/2014 0931   K 4.0 03/20/2014 1101   K 3.0* 01/29/2014 0931   CL 102 01/29/2014 0931   CO2 22 03/20/2014 1101   CO2 25 01/29/2014 0931   BUN 12.1 03/20/2014 1101   BUN 10 01/29/2014 0931   CREATININE 0.8 03/20/2014 1101   CREATININE 0.71 01/29/2014 0931    CREATININE 0.76 10/18/2013 1438      Component Value Date/Time   CALCIUM 10.0 03/20/2014 1101   CALCIUM 7.8* 01/29/2014 0931   ALKPHOS 88 03/20/2014 1101   ALKPHOS 72 01/23/2014 1327   AST 35* 03/20/2014 1101  AST 16 01/23/2014 1327   ALT 19 03/20/2014 1101   ALT 23 01/23/2014 1327   BILITOT 0.20 03/20/2014 1101   BILITOT 0.2* 01/23/2014 1327      STUDIES: No results found.  ASSESSMENT: 58 y.o. [place] woman with stage IV breast cancer involving brain, lung, and bones   (1) status post right breast biopsy of 2 separate masses in the right axillary lymph node 12/21/2013 showing a clinical T3 N3, stage IIIC invasive ductal carcinoma, triple negative, with an MIB-1 of 85%; staging studies 01/04/2014 show metastatic disease to the brain, lung, and bones.  (2) status post stereotactic radiosurgery to a 1.7 cm left parietal lesion: 20 Gy given 01/13/2014  (3) systemic therapy consists of neoadjuvant eribulin, given days 1 and 8 of each 21 day cycle  (4) zolendronate to be started once the patient's dental condition has been optimized. I am arranging for her to see a dentist.  (5) right mastectomy for local control to be considered once systemic disease comes under good control  (6) also status post left lumpectomy and sentinel lymph node sampling 11/23/2007 for a pTis pN0, stage 0 ductal carcinoma in situ, high-grade, estrogen and progesterone receptor negative, status post adjuvant radiation  PLAN: Zavia continues to manage chemo well. The labs were reviewed in detail and her WBC has finally trended back down to normal. The rest of the labs were stable. We will proceed with day 1, cycle 4 of eribulin today. She will continue with her tobacco cessation and diet changes.  Charmayne continues to be in need of a dentist visit so that we can start her zolendronate treatments. She states she has no been in over 5 years. I will work on finding a provider who accepts medicaid patients.   Next week  Avarey will finish up cycle 4, then have restaging scans performed. She will return the following week to review the results and form a new treatment plan if necessary. She understands and agrees with this plan. She knows the goal of treatment in her case is control. She has been encouraged to call with any issues that might arise before her next visit here.   Marcelino Duster, NP  03/20/2014 11:55 AM

## 2014-03-23 ENCOUNTER — Telehealth: Payer: Self-pay | Admitting: Oncology

## 2014-03-23 NOTE — Telephone Encounter (Signed)
per 10/5 pof moved 10/21 appt from [facility] to [place] @ 8:15am. lmonvm for pt dtr tracey re new time for 10/21. other appts remain the same. confirmed next appt for 10/12 and pt to get new schedule then. pt dtr tracey is the person identified on vm for the number listed in EPIC.

## 2014-03-27 ENCOUNTER — Telehealth: Payer: Self-pay | Admitting: Oncology

## 2014-03-27 ENCOUNTER — Other Ambulatory Visit: Payer: Self-pay | Admitting: *Deleted

## 2014-03-27 ENCOUNTER — Other Ambulatory Visit (HOSPITAL_BASED_OUTPATIENT_CLINIC_OR_DEPARTMENT_OTHER): Payer: Medicaid Other

## 2014-03-27 ENCOUNTER — Encounter: Payer: Self-pay | Admitting: Nurse Practitioner

## 2014-03-27 ENCOUNTER — Ambulatory Visit (HOSPITAL_BASED_OUTPATIENT_CLINIC_OR_DEPARTMENT_OTHER): Payer: Medicaid Other | Admitting: Nurse Practitioner

## 2014-03-27 ENCOUNTER — Ambulatory Visit (HOSPITAL_BASED_OUTPATIENT_CLINIC_OR_DEPARTMENT_OTHER): Payer: Medicaid Other

## 2014-03-27 VITALS — BP 120/63 | HR 103 | Temp 98.8°F | Resp 20 | Ht 59.0 in | Wt 116.8 lb

## 2014-03-27 DIAGNOSIS — J449 Chronic obstructive pulmonary disease, unspecified: Secondary | ICD-10-CM

## 2014-03-27 DIAGNOSIS — R21 Rash and other nonspecific skin eruption: Secondary | ICD-10-CM

## 2014-03-27 DIAGNOSIS — C7800 Secondary malignant neoplasm of unspecified lung: Secondary | ICD-10-CM

## 2014-03-27 DIAGNOSIS — C7949 Secondary malignant neoplasm of other parts of nervous system: Secondary | ICD-10-CM

## 2014-03-27 DIAGNOSIS — C7931 Secondary malignant neoplasm of brain: Secondary | ICD-10-CM

## 2014-03-27 DIAGNOSIS — M542 Cervicalgia: Secondary | ICD-10-CM

## 2014-03-27 DIAGNOSIS — C773 Secondary and unspecified malignant neoplasm of axilla and upper limb lymph nodes: Secondary | ICD-10-CM

## 2014-03-27 DIAGNOSIS — M545 Low back pain: Secondary | ICD-10-CM

## 2014-03-27 DIAGNOSIS — C50911 Malignant neoplasm of unspecified site of right female breast: Secondary | ICD-10-CM

## 2014-03-27 DIAGNOSIS — C50411 Malignant neoplasm of upper-outer quadrant of right female breast: Secondary | ICD-10-CM

## 2014-03-27 DIAGNOSIS — Z5111 Encounter for antineoplastic chemotherapy: Secondary | ICD-10-CM

## 2014-03-27 DIAGNOSIS — C7951 Secondary malignant neoplasm of bone: Secondary | ICD-10-CM

## 2014-03-27 DIAGNOSIS — C50919 Malignant neoplasm of unspecified site of unspecified female breast: Secondary | ICD-10-CM

## 2014-03-27 DIAGNOSIS — L298 Other pruritus: Secondary | ICD-10-CM

## 2014-03-27 DIAGNOSIS — Z853 Personal history of malignant neoplasm of breast: Secondary | ICD-10-CM

## 2014-03-27 DIAGNOSIS — C50811 Malignant neoplasm of overlapping sites of right female breast: Secondary | ICD-10-CM

## 2014-03-27 DIAGNOSIS — G893 Neoplasm related pain (acute) (chronic): Secondary | ICD-10-CM | POA: Insufficient documentation

## 2014-03-27 LAB — CBC WITH DIFFERENTIAL/PLATELET
BASO%: 0.3 % (ref 0.0–2.0)
BASOS ABS: 0 10*3/uL (ref 0.0–0.1)
EOS%: 0.4 % (ref 0.0–7.0)
Eosinophils Absolute: 0 10*3/uL (ref 0.0–0.5)
HEMATOCRIT: 34.2 % — AB (ref 34.8–46.6)
HGB: 11.1 g/dL — ABNORMAL LOW (ref 11.6–15.9)
LYMPH#: 1.3 10*3/uL (ref 0.9–3.3)
LYMPH%: 14.1 % (ref 14.0–49.7)
MCH: 30.8 pg (ref 25.1–34.0)
MCHC: 32.5 g/dL (ref 31.5–36.0)
MCV: 95 fL (ref 79.5–101.0)
MONO#: 0.6 10*3/uL (ref 0.1–0.9)
MONO%: 6 % (ref 0.0–14.0)
NEUT#: 7.3 10*3/uL — ABNORMAL HIGH (ref 1.5–6.5)
NEUT%: 79.2 % — AB (ref 38.4–76.8)
PLATELETS: 261 10*3/uL (ref 145–400)
RBC: 3.6 10*6/uL — ABNORMAL LOW (ref 3.70–5.45)
RDW: 18.8 % — ABNORMAL HIGH (ref 11.2–14.5)
WBC: 9.2 10*3/uL (ref 3.9–10.3)
nRBC: 1 % — ABNORMAL HIGH (ref 0–0)

## 2014-03-27 LAB — TECHNOLOGIST REVIEW

## 2014-03-27 LAB — COMPREHENSIVE METABOLIC PANEL (CC13)
ALBUMIN: 3.4 g/dL — AB (ref 3.5–5.0)
ALK PHOS: 84 U/L (ref 40–150)
ALT: 20 U/L (ref 0–55)
AST: 41 U/L — ABNORMAL HIGH (ref 5–34)
Anion Gap: 13 mEq/L — ABNORMAL HIGH (ref 3–11)
BUN: 6.2 mg/dL — AB (ref 7.0–26.0)
CALCIUM: 10.2 mg/dL (ref 8.4–10.4)
CO2: 23 mEq/L (ref 22–29)
Chloride: 103 mEq/L (ref 98–109)
Creatinine: 0.8 mg/dL (ref 0.6–1.1)
GLUCOSE: 98 mg/dL (ref 70–140)
Potassium: 3.9 mEq/L (ref 3.5–5.1)
Sodium: 138 mEq/L (ref 136–145)
Total Bilirubin: 0.27 mg/dL (ref 0.20–1.20)
Total Protein: 6.7 g/dL (ref 6.4–8.3)

## 2014-03-27 MED ORDER — DEXAMETHASONE SODIUM PHOSPHATE 10 MG/ML IJ SOLN
10.0000 mg | Freq: Once | INTRAMUSCULAR | Status: AC
  Administered 2014-03-27: 10 mg via INTRAVENOUS

## 2014-03-27 MED ORDER — CYCLOBENZAPRINE HCL 5 MG PO TABS: 5.0000 mg | ORAL_TABLET | Freq: Three times a day (TID) | ORAL | Status: AC | PRN

## 2014-03-27 MED ORDER — LORAZEPAM 0.5 MG PO TABS: 0.5000 mg | ORAL_TABLET | Freq: Three times a day (TID) | ORAL | Status: AC

## 2014-03-27 MED ORDER — HEPARIN SOD (PORK) LOCK FLUSH 100 UNIT/ML IV SOLN
500.0000 [IU] | Freq: Once | INTRAVENOUS | Status: AC | PRN
  Administered 2014-03-27: 500 [IU]
  Filled 2014-03-27: qty 5

## 2014-03-27 MED ORDER — METHYLPREDNISOLONE 4 MG PO KIT: PACK | ORAL | Status: DC

## 2014-03-27 MED ORDER — DEXAMETHASONE SODIUM PHOSPHATE 10 MG/ML IJ SOLN
INTRAMUSCULAR | Status: AC
  Filled 2014-03-27: qty 1

## 2014-03-27 MED ORDER — ONDANSETRON 8 MG/NS 50 ML IVPB
INTRAVENOUS | Status: AC
  Filled 2014-03-27: qty 8

## 2014-03-27 MED ORDER — ONDANSETRON 8 MG/50ML IVPB (CHCC)
8.0000 mg | Freq: Once | INTRAVENOUS | Status: AC
  Administered 2014-03-27: 8 mg via INTRAVENOUS

## 2014-03-27 MED ORDER — SODIUM CHLORIDE 0.9 % IJ SOLN
10.0000 mL | INTRAMUSCULAR | Status: DC | PRN
  Administered 2014-03-27: 10 mL
  Filled 2014-03-27: qty 10

## 2014-03-27 MED ORDER — SODIUM CHLORIDE 0.9 % IV SOLN
1.4000 mg/m2 | Freq: Once | INTRAVENOUS | Status: AC
  Administered 2014-03-27: 2 mg via INTRAVENOUS
  Filled 2014-03-27: qty 4

## 2014-03-27 MED ORDER — SODIUM CHLORIDE 0.9 % IV SOLN
Freq: Once | INTRAVENOUS | Status: AC
  Administered 2014-03-27: 12:00:00 via INTRAVENOUS

## 2014-03-27 NOTE — Telephone Encounter (Signed)
pt daughter cald to see what time appt was on 10/21

## 2014-03-27 NOTE — Patient Instructions (Signed)
[facility] Discharge Instructions for Patients Receiving Chemotherapy  Today you received the following chemotherapy agents halaven   To help prevent nausea and vomiting after your treatment, we encourage you to take your nausea medication as directed. If you develop nausea and vomiting that is not controlled by your nausea medication, call the clinic.   BELOW ARE SYMPTOMS THAT SHOULD BE REPORTED IMMEDIATELY:  *FEVER GREATER THAN 100.5 F  *CHILLS WITH OR WITHOUT FEVER  NAUSEA AND VOMITING THAT IS NOT CONTROLLED WITH YOUR NAUSEA MEDICATION  *UNUSUAL SHORTNESS OF BREATH  *UNUSUAL BRUISING OR BLEEDING  TENDERNESS IN MOUTH AND THROAT WITH OR WITHOUT PRESENCE OF ULCERS  *URINARY PROBLEMS  *BOWEL PROBLEMS  UNUSUAL RASH Items with * indicate a potential emergency and should be followed up as soon as possible.  Feel free to call the clinic you have any questions or concerns. The clinic phone number is [phone].  

## 2014-03-27 NOTE — Progress Notes (Signed)
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 The patient then had routine yearly mammography until 10/24/2009.  Sometime in 2014 the patient noted a mass in her right breast. She states she brought this to medical attention several times, although I do not see that reflected in the outside notes we have. In any case on 12/06/2013 the patient presented to her primary clinic with a complaint of left-sided breast pain. Exam showed the right breast however to be larger than the left. There was a thickened area versus lump at the 12:00 position in the right breast above the areola. There was also a mass in the right upper quadrant of the right breast. The patient was referred to the breast Center and on 6:30 at 2015 bilateral diagnostic mammography and right breast ultrasonography at the breast Center showed a 6 cm area in the right breast upper outer quadrant with increased density associated with branching calcifications. There was a separate area more medially that also appeared abnormal. There was diffuse skin thickening and interval enlarged right axillary lymph nodes. On exam the mammographer was able to palpate a 5 cm firm fixed palpable mass in  the upper-outer quadrant of the right breast, but mild skin redness. There was also a palpable right axillary lymph node. Ultrasound confirmed a large irregular hypoechoic mass at the 11:00 position of the right breast measuring 4.1 cm maximally. More laterally a separate mass in the 9:00 position measure 4.4 cm maximally. In the right axilla there were multiple abnormal appearing axillary lymph nodes.  Biopsy of both these suspicious masses in the right breast as well as one of the suspicious lymph nodes 12/21/2013 showed ([id number]) all 3 biopsies do show identical invasive ductal carcinoma, grade 3, triple negative, with an MIB-1 of 85%. On 12/26/2013 the patient underwent bilateral breast MRI. This showed an abnormal area measuring 7.2 cm in the central right breast involving or 4 quadrants and centered superiorly, with diffuse skin thickening as well as level I and 2 metastatic right axillary adenopathy. There was also an enlarged right internal mammary lymph node. The left side was unremarkable.   INTERVAL HISTORY: [name] returns today for follow up of her metastatic breast cancer, accompanied by her daughter, [name]. Today is day 8, cycle 4 of eribulin, given on day 1 and 8 of each 21 day cycle. [name]'s main complaint today is pain. She has left lower back pain that causes her legs to ache. She also has muscle spasms to the base of her neck. This pain is 9/10, but the oxycodone she has been prescribed is helpful. The spasms keep her up at night. She also has a rash to her bilateral upper extremities that has many small non-erythematous bumps. She has tried applying lotion to these areas, but the itchiness also keeps her up at night. This appeared  about 1 week ago. She denies changes lotions, soaps, or deodorants. The rash is not present anywhere else on her body and has not spread.   REVIEW OF SYSTEMS: [name] denies fever or chills. She has some mild nausea handled well with her PRN antiemetics.  She has no changes in bowel or bladder habits. Her appetite is decreased some. She is no longer smoking daily, but can skip a day in between. She continues to cough regularly and has shortness of breath with exertion, secondary to her COPD. She denies chest pains or palpitations. The swelling to her bilateral ankles seen last week has resolved. Her vision is blurred at times, but this is unchanged. A detailed review of systems is otherwise noncontributory.   PAST MEDICAL HISTORY: Past Medical History  Diagnosis Date  . Acid reflux   . Asthma   . COPD (chronic obstructive pulmonary disease)   . Cancer     lumpectomy, radiation 2009  . Seasonal allergies   . Alcohol abuse   . Elevated d-dimer     negative chest CT  . Allergy   . Emphysema of lung   . Hot flashes   . Reading disorder   . Impaired writing skills   . Wears glasses   . Wears dentures     top  . HOH (hard of hearing)     left ear  . S/P radiation therapy 01/13/14    SRS left parietal 20Gy    PAST SURGICAL HISTORY: Past Surgical History  Procedure Laterality Date  . Breast lumpectomy    . Abdominal hysterectomy    . Portacath placement Left 12/30/2013    Procedure: INSERTION PORT-A-CATH;  Surgeon: Merrie Roof, MD;  Location: [facility];  Service: General;  Laterality: Left;  subclavian area    FAMILY HISTORY Family History  Problem Relation Age of Onset  . Hypertension Other    the patient has little information about her father or his side of the family. The patient's mother died at the age of 30. The patient had 2 brothers and 4 sisters. There is no history of breast or ovarian cancer in the family to her knowledge.  GYNECOLOGIC HISTORY:  No LMP recorded. Patient has had a hysterectomy. The patient does not remember when she started menstruating. First live birth age 12. She is GX P1. She underwent hysterectomy approximately 12 years ago, but does not know whether she had bilateral  salpingo-oophorectomy. 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 She is married but separated from her husband [name], who lives in [place]. The patient lives by herself, with no pets. Her daughter [name] works as a Stage manager for [employer] care in [place] the patient has 3 grandchildren and 2 great-grandchildren. She is a Psychologist, forensic.    ADVANCED DIRECTIVES: Not in place; at her 12/28/2013 visit the patient was given the appropriate documents to complete and notarize so she may declare a healthcare power of attorney at her discretion   HEALTH MAINTENANCE: History  Substance Use Topics  . Smoking status: Current Every Day Smoker -- 1.00 packs/day for 30 years    Types: Cigarettes  . Smokeless tobacco: Not on file     Comment: down to 1-2 cigarettes daily6  . Alcohol Use: Yes     Comment: occ beer     Colonoscopy: Never  PAP: Status post hysterectomy  Bone density: Never  Lipid panel:  Allergies  Allergen Reactions  . Penicillins Other (See Comments)    Reaction a long time ago    Current Outpatient Prescriptions  Medication Sig Dispense Refill  . albuterol (PROVENTIL HFA;VENTOLIN HFA) 108 (90 BASE) MCG/ACT inhaler Inhale 2 puffs into the lungs every 6 (six) hours as needed for wheezing or shortness of breath. For shortness of breath  3 Inhaler  3  . amLODipine (NORVASC) 2.5 MG tablet Take 1 tablet (2.5 mg total) by mouth daily.  90 tablet  3  . butalbital-acetaminophen-caffeine (FIORICET) 50-325-40 MG per tablet Take 1-2 tablets by mouth every 6 (six) hours as needed for headache.  20 tablet  0  . carbamide peroxide (DEBROX) 6.5 % otic solution Place 5 drops into both ears 2 (two) times daily.  15 mL  1  . dexamethasone (DECADRON) 4 MG tablet Take 2 mg by mouth daily.       . fluticasone (FLOVENT HFA) 220 MCG/ACT inhaler Inhale 1 puff into the lungs 2 (two) times daily.  3  Inhaler  3  . lidocaine-prilocaine (EMLA) cream Apply 1 application topically as needed. Apply over port area 1-2 hours before chemo, cover with plastic wrap  30 g  0  . LORazepam (ATIVAN) 0.5 MG tablet Take 1 tablet (0.5 mg total) by mouth every 8 (eight) hours.  30 tablet  0  . ondansetron (ZOFRAN) 8 MG tablet Take 1 tablet (8 mg total) by mouth 2 (two) times daily. Start the day after chemo for 2 days. Then take as needed for nausea or vomiting.  30 tablet  1  . oxyCODONE-acetaminophen (ROXICET) 5-325 MG per tablet Take 1-2 tablets by mouth every 4 (four) hours as needed.  50 tablet  0  . pantoprazole (PROTONIX) 40 MG tablet Take 1 tablet (40 mg total) by mouth daily.  30 tablet  2  . Diphenhyd-Hydrocort-Nystatin (FIRST-DUKES MOUTHWASH) SUSP Use as directed 5 mLs in the mouth or throat 4 (four) times daily. Swish, gargle and spit one to two teaspoonfuls every six hours as needed. May be swallowed if esophageal involvement.  120 mL  1  . hydrocortisone (ANUSOL-HC) 25 MG suppository Place 25 mg rectally 2 (two) times daily as needed.      . prochlorperazine (COMPAZINE) 10 MG tablet Take 1 tablet (10 mg total) by mouth every 6 (six) hours as needed (Nausea or vomiting).  30 tablet  1   No current facility-administered medications for this visit.    OBJECTIVE: Middle-aged [place] American woman in no acute distress Filed Vitals:   03/27/14 1037  BP: 120/63  Pulse: 103  Temp: 98.8 F (37.1 C)  Resp: 20     Body mass index is 23.58 kg/(m^2).    ECOG FS:1 - Symptomatic but completely ambulatory  Skin:  hyperpigmented nail beds and palms, papular rash to bilateral forearms, pruritic, no erythema HEENT: sclerae anicteric, conjunctivae pink, oropharynx clear. No thrush or mucositis.  Lymph Nodes: No cervical or supraclavicular lymphadenopathy  Lungs: clear to auscultation bilaterally, no rales, wheezes, or rhonci  Heart: regular rate and rhythm  Abdomen: round, soft, non tender, positive bowel  sounds  Musculoskeletal: No focal spinal tenderness, no peripheral edema  Neuro: non focal, well oriented, positive affect  Breasts: deferred   LAB RESULTS:  Lab Results  Component Value Date   WBC 9.2 03/27/2014   HGB 11.1* 03/27/2014   HCT 34.2* 03/27/2014   MCV 95.0 03/27/2014   PLT 261 03/27/2014     Chemistry  Component Value Date/Time   NA 141 03/20/2014 1101   NA 141 01/29/2014 0931   K 4.0 03/20/2014 1101   K 3.0* 01/29/2014 0931   CL 102 01/29/2014 0931   CO2 22 03/20/2014 1101   CO2 25 01/29/2014 0931   BUN 12.1 03/20/2014 1101   BUN 10 01/29/2014 0931   CREATININE 0.8 03/20/2014 1101   CREATININE 0.71 01/29/2014 0931   CREATININE 0.76 10/18/2013 1438      Component Value Date/Time   CALCIUM 10.0 03/20/2014 1101   CALCIUM 7.8* 01/29/2014 0931   ALKPHOS 88 03/20/2014 1101   ALKPHOS 72 01/23/2014 1327   AST 35* 03/20/2014 1101   AST 16 01/23/2014 1327   ALT 19 03/20/2014 1101   ALT 23 01/23/2014 1327   BILITOT 0.20 03/20/2014 1101   BILITOT 0.2* 01/23/2014 1327      STUDIES: No results found.  ASSESSMENT: 58 y.o. [place] woman with stage IV breast cancer involving brain, lung, and bones   (1) status post right breast biopsy of 2 separate masses in the right axillary lymph node 12/21/2013 showing a clinical T3 N3, stage IIIC invasive ductal carcinoma, triple negative, with an MIB-1 of 85%; staging studies 01/04/2014 show metastatic disease to the brain, lung, and bones.  (2) status post stereotactic radiosurgery to a 1.7 cm left parietal lesion: 20 Gy given 01/13/2014  (3) systemic therapy consists of neoadjuvant eribulin, given days 1 and 8 of each 21 day cycle  (4) zolendronate to be started once the patient's dental condition has been optimized. I am arranging for her to see a dentist.  (5) right mastectomy for local control to be considered once systemic disease comes under good control  (6) also status post left lumpectomy and sentinel lymph node sampling  11/23/2007 for a pTis pN0, stage 0 ductal carcinoma in situ, high-grade, estrogen and progesterone receptor negative, status post adjuvant radiation  PLAN:  The labs were reviewed in detail. For the past 2 weeks there has a been an isolated elevation in her AST. As it is mild, we will continue to monitor this value and proceed with day 8, cycle 4 of eribulin today.   As for her left neck pain, I am prescribing Ajiah 5mg  flexeril TID PRN. We discussed not driving while using this medication, and spacing it several hours apart from her pain medicine and nightly lorazepam as drowsiness is the main side effect. She already has a brain MRI scheduled for this Friday and meets with Dr. Lisbeth Renshaw on Monday to discuss these results. She previously had stereotactic radiation to her left parietal lobe this summer. I have also written her a medrol dose pak to clear the pruritic rash her bilateral arms.   After her appointment with Dr. Lisbeth Renshaw on Monday, she has a restaging PET scan scheduled. She will meet next Wednesday with Dr. Jana Hakim to discuss these results. She understands and agrees with this plan. She knows the goal of treatment in her case is control. She has been encouraged to call with any issues that might arise before her next visit here.    Marcelino Duster, NP  03/27/2014 10:58 AM

## 2014-03-31 ENCOUNTER — Ambulatory Visit
Admission: RE | Admit: 2014-03-31 | Discharge: 2014-03-31 | Disposition: A | Discharge status: Home / Self Care | Payer: Medicaid Other | Source: Ambulatory Visit | Attending: Radiation Oncology | Admitting: Radiation Oncology

## 2014-03-31 DIAGNOSIS — C7949 Secondary malignant neoplasm of other parts of nervous system: Principal | ICD-10-CM

## 2014-03-31 DIAGNOSIS — C7931 Secondary malignant neoplasm of brain: Secondary | ICD-10-CM

## 2014-03-31 MED ORDER — GADOBENATE DIMEGLUMINE 529 MG/ML IV SOLN
10.0000 mL | Freq: Once | INTRAVENOUS | Status: AC | PRN
  Administered 2014-03-31: 10 mL via INTRAVENOUS

## 2014-04-03 ENCOUNTER — Other Ambulatory Visit: Payer: Self-pay

## 2014-04-03 ENCOUNTER — Emergency Department (HOSPITAL_COMMUNITY)
Admission: EM | Admit: 2014-04-03 | Discharge: 2014-04-03 | Disposition: A | Discharge status: Home / Self Care | Payer: Medicaid Other | Attending: Emergency Medicine | Admitting: Emergency Medicine

## 2014-04-03 ENCOUNTER — Ambulatory Visit
Admission: RE | Admit: 2014-04-03 | Discharge: 2014-04-03 | Disposition: A | Discharge status: Home / Self Care | Payer: Medicaid Other | Source: Ambulatory Visit | Attending: Radiation Oncology | Admitting: Radiation Oncology

## 2014-04-03 ENCOUNTER — Encounter (HOSPITAL_COMMUNITY): Payer: Medicaid Other

## 2014-04-03 ENCOUNTER — Encounter (HOSPITAL_COMMUNITY): Payer: Self-pay

## 2014-04-03 ENCOUNTER — Ambulatory Visit (HOSPITAL_COMMUNITY): Payer: Medicaid Other

## 2014-04-03 ENCOUNTER — Encounter (HOSPITAL_COMMUNITY): Payer: Self-pay | Admitting: Emergency Medicine

## 2014-04-03 ENCOUNTER — Emergency Department (HOSPITAL_COMMUNITY): Payer: Medicaid Other

## 2014-04-03 ENCOUNTER — Other Ambulatory Visit: Payer: Self-pay | Admitting: Oncology

## 2014-04-03 ENCOUNTER — Ambulatory Visit (HOSPITAL_COMMUNITY)
Admission: RE | Admit: 2014-04-03 | Discharge: 2014-04-03 | Disposition: A | Discharge status: Home / Self Care | Payer: Medicaid Other | Source: Ambulatory Visit | Attending: Oncology | Admitting: Oncology

## 2014-04-03 ENCOUNTER — Encounter: Payer: Self-pay | Admitting: Radiation Oncology

## 2014-04-03 VITALS — BP 144/70 | HR 119 | Temp 99.3°F | Resp 22 | Ht 59.0 in | Wt 123.9 lb

## 2014-04-03 DIAGNOSIS — C7931 Secondary malignant neoplasm of brain: Secondary | ICD-10-CM

## 2014-04-03 DIAGNOSIS — Z79899 Other long term (current) drug therapy: Secondary | ICD-10-CM | POA: Insufficient documentation

## 2014-04-03 DIAGNOSIS — K219 Gastro-esophageal reflux disease without esophagitis: Secondary | ICD-10-CM | POA: Diagnosis not present

## 2014-04-03 DIAGNOSIS — Z7951 Long term (current) use of inhaled steroids: Secondary | ICD-10-CM | POA: Diagnosis not present

## 2014-04-03 DIAGNOSIS — J449 Chronic obstructive pulmonary disease, unspecified: Secondary | ICD-10-CM | POA: Insufficient documentation

## 2014-04-03 DIAGNOSIS — C7949 Secondary malignant neoplasm of other parts of nervous system: Secondary | ICD-10-CM

## 2014-04-03 DIAGNOSIS — Z98811 Dental restoration status: Secondary | ICD-10-CM | POA: Insufficient documentation

## 2014-04-03 DIAGNOSIS — C50411 Malignant neoplasm of upper-outer quadrant of right female breast: Secondary | ICD-10-CM | POA: Diagnosis not present

## 2014-04-03 DIAGNOSIS — Z880 Allergy status to penicillin: Secondary | ICD-10-CM | POA: Diagnosis not present

## 2014-04-03 DIAGNOSIS — C7951 Secondary malignant neoplasm of bone: Secondary | ICD-10-CM | POA: Insufficient documentation

## 2014-04-03 DIAGNOSIS — R000 Tachycardia, unspecified: Secondary | ICD-10-CM | POA: Diagnosis not present

## 2014-04-03 DIAGNOSIS — Z859 Personal history of malignant neoplasm, unspecified: Secondary | ICD-10-CM | POA: Diagnosis not present

## 2014-04-03 DIAGNOSIS — Z5189 Encounter for other specified aftercare: Secondary | ICD-10-CM | POA: Diagnosis not present

## 2014-04-03 DIAGNOSIS — R52 Pain, unspecified: Secondary | ICD-10-CM | POA: Diagnosis present

## 2014-04-03 DIAGNOSIS — Z8659 Personal history of other mental and behavioral disorders: Secondary | ICD-10-CM | POA: Insufficient documentation

## 2014-04-03 DIAGNOSIS — J90 Pleural effusion, not elsewhere classified: Secondary | ICD-10-CM | POA: Insufficient documentation

## 2014-04-03 DIAGNOSIS — H9190 Unspecified hearing loss, unspecified ear: Secondary | ICD-10-CM | POA: Diagnosis not present

## 2014-04-03 DIAGNOSIS — C50919 Malignant neoplasm of unspecified site of unspecified female breast: Secondary | ICD-10-CM

## 2014-04-03 DIAGNOSIS — Z720 Tobacco use: Secondary | ICD-10-CM | POA: Insufficient documentation

## 2014-04-03 LAB — URINALYSIS, ROUTINE W REFLEX MICROSCOPIC
Bilirubin Urine: NEGATIVE
GLUCOSE, UA: NEGATIVE mg/dL
Hgb urine dipstick: NEGATIVE
Ketones, ur: NEGATIVE mg/dL
Leukocytes, UA: NEGATIVE
Nitrite: NEGATIVE
PH: 7 (ref 5.0–8.0)
Protein, ur: NEGATIVE mg/dL
SPECIFIC GRAVITY, URINE: 1.015 (ref 1.005–1.030)
Urobilinogen, UA: 0.2 mg/dL (ref 0.0–1.0)

## 2014-04-03 LAB — CBC WITH DIFFERENTIAL/PLATELET
BASOS ABS: 0.1 10*3/uL (ref 0.0–0.1)
Basophils Relative: 1 % (ref 0–1)
Eosinophils Absolute: 0 10*3/uL (ref 0.0–0.7)
Eosinophils Relative: 0 % (ref 0–5)
HEMATOCRIT: 31.8 % — AB (ref 36.0–46.0)
Hemoglobin: 10.3 g/dL — ABNORMAL LOW (ref 12.0–15.0)
LYMPHS ABS: 1.8 10*3/uL (ref 0.7–4.0)
Lymphocytes Relative: 15 % (ref 12–46)
MCH: 31.1 pg (ref 26.0–34.0)
MCHC: 32.4 g/dL (ref 30.0–36.0)
MCV: 96.1 fL (ref 78.0–100.0)
Monocytes Absolute: 1 10*3/uL (ref 0.1–1.0)
Monocytes Relative: 8 % (ref 3–12)
NEUTROS ABS: 9.1 10*3/uL — AB (ref 1.7–7.7)
Neutrophils Relative %: 76 % (ref 43–77)
Platelets: 241 10*3/uL (ref 150–400)
RBC: 3.31 MIL/uL — ABNORMAL LOW (ref 3.87–5.11)
RDW: 19.7 % — AB (ref 11.5–15.5)
WBC Morphology: INCREASED
WBC: 12 10*3/uL — AB (ref 4.0–10.5)

## 2014-04-03 LAB — COMPREHENSIVE METABOLIC PANEL
ALK PHOS: 87 U/L (ref 39–117)
ALT: 9 U/L (ref 0–35)
AST: 30 U/L (ref 0–37)
Albumin: 3.4 g/dL — ABNORMAL LOW (ref 3.5–5.2)
Anion gap: 16 — ABNORMAL HIGH (ref 5–15)
BILIRUBIN TOTAL: 0.3 mg/dL (ref 0.3–1.2)
BUN: 11 mg/dL (ref 6–23)
CHLORIDE: 93 meq/L — AB (ref 96–112)
CO2: 26 mEq/L (ref 19–32)
Calcium: 10.4 mg/dL (ref 8.4–10.5)
Creatinine, Ser: 0.74 mg/dL (ref 0.50–1.10)
GLUCOSE: 87 mg/dL (ref 70–99)
Potassium: 4.1 mEq/L (ref 3.7–5.3)
Sodium: 135 mEq/L — ABNORMAL LOW (ref 137–147)
Total Protein: 7 g/dL (ref 6.0–8.3)

## 2014-04-03 LAB — LIPASE, BLOOD: Lipase: 12 U/L (ref 11–59)

## 2014-04-03 MED ORDER — HEPARIN SOD (PORK) LOCK FLUSH 100 UNIT/ML IV SOLN
500.0000 [IU] | Freq: Once | INTRAVENOUS | Status: AC
  Administered 2014-04-03: 500 [IU]
  Filled 2014-04-03: qty 5

## 2014-04-03 MED ORDER — SODIUM CHLORIDE 0.9 % IV BOLUS (SEPSIS)
1000.0000 mL | Freq: Once | INTRAVENOUS | Status: AC
  Administered 2014-04-03: 1000 mL via INTRAVENOUS

## 2014-04-03 MED ORDER — IOHEXOL 300 MG/ML  SOLN
80.0000 mL | Freq: Once | INTRAMUSCULAR | Status: AC | PRN
  Administered 2014-04-03: 80 mL via INTRAVENOUS

## 2014-04-03 MED ORDER — HYDROMORPHONE HCL 1 MG/ML IJ SOLN
1.0000 mg | Freq: Once | INTRAMUSCULAR | Status: AC
  Administered 2014-04-03: 1 mg via INTRAVENOUS
  Filled 2014-04-03: qty 1

## 2014-04-03 MED ORDER — SODIUM CHLORIDE 0.9 % IV SOLN
INTRAVENOUS | Status: DC
  Administered 2014-04-03: 23:00:00 via INTRAVENOUS

## 2014-04-03 MED ORDER — LORAZEPAM 2 MG/ML IJ SOLN
0.5000 mg | Freq: Once | INTRAMUSCULAR | Status: AC
Start: 2014-04-03 — End: 2014-04-03
  Administered 2014-04-03: 0.5 mg via INTRAVENOUS
  Filled 2014-04-03: qty 1

## 2014-04-03 NOTE — Progress Notes (Signed)
Follow up MRI brain 03/31/14 and just completed CT scan today,results in,. Patient c/o chest righ sided pain and lower back pain, room air 92%, sob with exertion,   Had [facility] 01/13/14 left pareital, no blurred vision,nausea, or headaches 9:36 AM

## 2014-04-03 NOTE — Discharge Instructions (Signed)
Pleural Effusion °The lining covering your lungs and the inside of your chest is called the pleura. Usually, the space between the two pleura contains no air and only a thin layer of fluid. A pleural effusion is an abnormal buildup of fluid in the pleural space. °Fluid gathers when there is increased pressure in the lung vessels. This forces fluids out of the lungs and into the pleural space. Vessels may also leak fluids when there are infections, such as pneumonia, or other causes of soreness and redness (inflammation). Fluids leak into the lungs when protein in the blood is low or when certain vessels (lymphatics) are blocked. °Finding a pleural effusion is important because it is usually caused by another disease. In order to treat a pleural effusion, your health care provider needs to find its cause. If left untreated, a large amount of fluid can build up and cause collapse of the lung. °CAUSES  °· Heart failure. °· Infections (pneumonia, tuberculosis), pulmonary embolism, pulmonary infarction. °· Cancer (primary lung and metastatic), asbestosis. °· Liver failure (cirrhosis). °· Nephrotic syndrome, peritoneal dialysis, kidney problems (uremia). °· Collagen vascular disease (systemic lupus erythematosus, rheumatoid arthritis). °· Injury (trauma) to the chest or rupture of the digestive tube (esophagus). °· Material in the chest or pleural space (hemothorax, chylothorax). °· Pancreatitis. °· Surgery. °· Drug reactions. °SYMPTOMS  °A pleural effusion can decrease the amount of space available for breathing and make you short of breath. The fluid can become infected, which may cause pain and fever. Often, the pain is worse when taking a deep breath. The underlying disease (heart failure, pneumonia, blood clot, tuberculosis, cancer) may also cause symptoms. °DIAGNOSIS  °· Your health care provider can usually tell what is wrong by talking to you (taking a history), doing an exam, and taking a routine X-ray. If the  X-ray shows fluid in your chest, often fluid is removed from your chest with a needle for testing (diagnostic thoracentesis). °· Sometimes, more specialized X-rays may be needed. °· Sometimes, a small piece of tissue is removed and examined by a specialist (biopsy). °TREATMENT  °Treatment varies based on what caused the pleural effusion. Treatments include: °· Removing as much fluid as possible using a needle (thoracentesis) to improve the cough and shortness of breath. This is a simple procedure that can be done at bedside. The risks are bleeding, infection, collapse of a lung, or low blood pressure. °· Placing a tube in the chest to drain the effusion (tube thoracostomy). This is often used when there is an infection in the fluid. This is a simple procedure that can often be done at bedside or in a clinic. The procedure may be painful. The risks are the same as using a needle to drain the fluid. The chest tube usually remains for a few days and is connected to suction to improve fluid drainage. After placement, the tube usually does not cause much discomfort. °· Surgical removal of fibrous debris in and around the pleural space (decortication). This may be done with a flexible telescope (thoracoscope) through a small or large cut (incision). This is helpful for patients who have fibrosis or scar tissue that prevents complete lung expansion. The risks are infection, blood loss, and side effects from general anesthesia. °· Sometimes, a procedure called pleurodesis is done. A chest tube is placed and the fluid is drained. Next, an agent (tetracycline, talc powder) is added to the pleural space. This causes the lung and chest wall to stick together (adhesion). This leaves no   potential space for fluid to build up. The risks include infection, blood loss, and side effects from general anesthesia. °· If the effusion is caused by infection, it may be treated with antibiotics and may improve without draining. °HOME CARE  INSTRUCTIONS  °· Take any medicines exactly as prescribed. °· Follow up with your health care provider as directed. °· Monitor your exercise capacity (the amount of walking you can do before you get short of breath). °· Do not use any tobacco products including cigarettes, chewing tobacco, or electronic cigarettes. °SEEK MEDICAL CARE IF:  °· Your exercise capacity seems to get worse or does not improve with time. °· You do not recover from your illness. °· You have drainage, redness, swelling, or pain at any incision or puncture sites. °SEEK IMMEDIATE MEDICAL CARE IF:  °· Shortness of breath or chest pain develops or gets worse. °· You have a fever. °· You develop a new cough, especially if the mucus (phlegm) is discolored. °MAKE SURE YOU:  °· Understand these instructions. °· Will watch your condition. °· Will get help right away if you are not doing well or get worse. °Document Released: 06/02/2005 Document Revised: 10/17/2013 Document Reviewed: 01/22/2007 °[employer]® Patient Information ©2015 [employer]. This information is not intended to replace advice given to you by your health care provider. Make sure you discuss any questions you have with your health care provider. ° °

## 2014-04-03 NOTE — ED Notes (Signed)
Pt ambulated to bathroom independently. Pt's O2 saturation dropped to 86% after ambulating. Pt placed on O2 @ 2lpm via [place], O2 sat returned to normal at 96%. RN is at bedside and is aware of Pt's O2 level after ambulation and intervention.

## 2014-04-03 NOTE — ED Notes (Addendum)
Pt reports generalized body pain for the last week. Pt being treated for stage 4 breast cancer, last chemo a week ago. Denies any n/v/d, sob or dizziness with pain. Has been taking home pain medications without relief.

## 2014-04-03 NOTE — ED Provider Notes (Signed)
CSN: [id number]     Arrival date & time 04/03/14  1818 History   First MD Initiated Contact with Patient 04/03/14 2106     Chief Complaint  Patient presents with  . Generalized Body Aches    ca pt     (Consider location/radiation/quality/duration/timing/severity/associated sxs/prior Treatment) HPI Comments: Patient here complaining of generalized body pain x1 week. History of stage IV breast cancer and nodes increased pain with movement and positions. No fever or cough noted. Had a CT of her chest today and I reviewed those results with the radiologist and he did not see any evidence of central pulmonary embolus. Her chest x-ray here today did show an increased pleural effusion. Denies any rashes to her chest wall. Symptoms persistent and unrelieved with pain medication. Denies any leg pain or swelling. She also has a history of COPD. Denies any anginal type symptoms  The history is provided by the patient.    Past Medical History  Diagnosis Date  . Acid reflux   . Asthma   . COPD (chronic obstructive pulmonary disease)   . Cancer     lumpectomy, radiation 2009  . Seasonal allergies   . Alcohol abuse   . Elevated d-dimer     negative chest CT  . Allergy   . Emphysema of lung   . Hot flashes   . Reading disorder   . Impaired writing skills   . Wears glasses   . Wears dentures     top  . HOH (hard of hearing)     left ear  . S/P radiation therapy 01/13/14    SRS left parietal 20Gy   Past Surgical History  Procedure Laterality Date  . Breast lumpectomy    . Abdominal hysterectomy    . Portacath placement Left 12/30/2013    Procedure: INSERTION PORT-A-CATH;  Surgeon: Merrie Roof, MD;  Location: [facility];  Service: General;  Laterality: Left;  subclavian area   Family History  Problem Relation Age of Onset  . Hypertension Other    History  Substance Use Topics  . Smoking status: Current Every Day Smoker -- 1.00 packs/day for 30 years    Types:  Cigarettes  . Smokeless tobacco: Not on file     Comment: down to 1-2 cigarettes daily6  . Alcohol Use: Yes     Comment: occ beer   OB History   Grav Para Term Preterm Abortions TAB SAB Ect Mult Living   1 1 1       1      Review of Systems  All other systems reviewed and are negative.     Allergies  Penicillins  Home Medications   Prior to Admission medications   Medication Sig Start Date End Date Taking? Authorizing Provider  albuterol (PROVENTIL HFA;VENTOLIN HFA) 108 (90 BASE) MCG/ACT inhaler Inhale 2 puffs into the lungs every 6 (six) hours as needed for wheezing or shortness of breath. For shortness of breath 02/27/14   Lorayne Marek, MD  amLODipine (NORVASC) 2.5 MG tablet Take 1 tablet (2.5 mg total) by mouth daily. 02/27/14   Lorayne Marek, MD  butalbital-acetaminophen-caffeine (FIORICET) 50-325-40 MG per tablet Take 1-2 tablets by mouth every 6 (six) hours as needed for headache. 01/29/14 01/29/15  Kristen N Ward, DO  carbamide peroxide (DEBROX) 6.5 % otic solution Place 5 drops into both ears 2 (two) times daily. 10/18/13   Lorayne Marek, MD  cyclobenzaprine (FLEXERIL) 5 MG tablet Take 1 tablet (5 mg total) by  mouth 3 (three) times daily as needed for muscle spasms. 03/27/14   Marcelino Duster, NP  dexamethasone (DECADRON) 4 MG tablet Take 2 mg by mouth daily.     Historical Provider, MD  Diphenhyd-Hydrocort-Nystatin (FIRST-DUKES MOUTHWASH) SUSP Use as directed 5 mLs in the mouth or throat 4 (four) times daily. Swish, gargle and spit one to two teaspoonfuls every six hours as needed. May be swallowed if esophageal involvement. 02/06/14   Marcelino Duster, NP  fluticasone (FLOVENT HFA) 220 MCG/ACT inhaler Inhale 1 puff into the lungs 2 (two) times daily. 02/27/14   Lorayne Marek, MD  hydrocortisone (ANUSOL-HC) 25 MG suppository Place 25 mg rectally 2 (two) times daily as needed. 01/23/14   Marcelino Duster, NP  lidocaine-prilocaine (EMLA) cream Apply 1 application topically as  needed. Apply over port area 1-2 hours before chemo, cover with plastic wrap 01/14/14   Chauncey Cruel, MD  LORazepam (ATIVAN) 0.5 MG tablet Take 1 tablet (0.5 mg total) by mouth every 8 (eight) hours. 03/27/14   Marcelino Duster, NP  methylPREDNISolone (MEDROL DOSEPAK) 4 MG tablet follow package directions 03/27/14   Marcelino Duster, NP  ondansetron (ZOFRAN) 8 MG tablet Take 1 tablet (8 mg total) by mouth 2 (two) times daily. Start the day after chemo for 2 days. Then take as needed for nausea or vomiting. 01/16/14   Chauncey Cruel, MD  oxyCODONE-acetaminophen (ROXICET) 5-325 MG per tablet Take 1-2 tablets by mouth every 4 (four) hours as needed. 03/06/14   Marcelino Duster, NP  pantoprazole (PROTONIX) 40 MG tablet Take 1 tablet (40 mg total) by mouth daily. 01/23/14   Marcelino Duster, NP  prochlorperazine (COMPAZINE) 10 MG tablet Take 1 tablet (10 mg total) by mouth every 6 (six) hours as needed (Nausea or vomiting). 01/16/14   Chauncey Cruel, MD   BP 107/63  Pulse 116  Temp(Src) 98.1 F (36.7 C) (Oral)  Resp 20  SpO2 92% Physical Exam  Nursing note and vitals reviewed. Constitutional: She is oriented to person, place, and time. She appears well-developed and well-nourished.  Non-toxic appearance. No distress.  HENT:  Head: Normocephalic and atraumatic.  Eyes: Conjunctivae, EOM and lids are normal. Pupils are equal, round, and reactive to light.  Neck: Normal range of motion. Neck supple. No tracheal deviation present. No mass present.  Cardiovascular: Regular rhythm and normal heart sounds.  Tachycardia present.  Exam reveals no gallop.   No murmur heard. Pulmonary/Chest: Effort normal and breath sounds normal. No stridor. No respiratory distress. She has no decreased breath sounds. She has no wheezes. She has no rhonchi. She has no rales.  Abdominal: Soft. Normal appearance and bowel sounds are normal. She exhibits no distension. There is no tenderness. There is no rebound and no  CVA tenderness.  Musculoskeletal: Normal range of motion. She exhibits no edema and no tenderness.  Neurological: She is alert and oriented to person, place, and time. She has normal strength. No cranial nerve deficit or sensory deficit. GCS eye subscore is 4. GCS verbal subscore is 5. GCS motor subscore is 6.  Skin: Skin is warm and dry. No abrasion and no rash noted.  Psychiatric: She has a normal mood and affect. Her speech is normal and behavior is normal.    ED Course  Procedures (including critical care time) Labs Review Labs Reviewed  URINE CULTURE  CBC WITH DIFFERENTIAL  COMPREHENSIVE METABOLIC PANEL  URINALYSIS, ROUTINE W REFLEX MICROSCOPIC  LIPASE, BLOOD    Imaging  Review Dg Chest 2 View (if Patient Has Fever And/or Copd)  04/03/2014   CLINICAL DATA:  58 year old female with current history of breast cancer diagnosed in July, metastatic disease to brain and bone. Ongoing chemotherapy. Generalized pain including mid chest pain. Shortness of breath and cough. Initial encounter.  EXAM: CHEST  2 VIEW  COMPARISON:  CT chest and CT Abdomen and Pelvis from 0841 hr today, and earlier.  FINDINGS: Stable left chest porta cath. Lower lung volumes with linear bibasilar opacity which most resembles atelectasis. Right suprahilar mass shows radiographic regression since 12/30/2013. No pneumothorax or pulmonary edema. Suggestion of small pleural effusions on these films, but there was non by CT earlier today. Stable cardiac size and mediastinal contours. Visualized tracheal air column is within normal limits.  Stable visualized osseous structures. Retained oral contrast in the colon.  IMPRESSION: Lower lung volumes with bibasilar atelectasis, and possibly new small pleural effusions since the chest abdomen and pelvis CTs done at 0841 hr today. See also that CT report.   Electronically Signed   By: Lars Pinks M.D.   On: 04/03/2014 20:13   Ct Chest W Contrast  04/03/2014   CLINICAL DATA:  Subsequent  treatment strategy for breast cancer. Right breast cancer diagnosed July, 2015 with metastatic disease to brain and bone. Ongoing chemotherapy. Prior left breast cancer in 2009.  EXAM: CT CHEST, ABDOMEN, AND PELVIS WITH CONTRAST  TECHNIQUE: Multidetector CT imaging of the chest, abdomen and pelvis was performed following the standard protocol during bolus administration of intravenous contrast.  CONTRAST:  31mL OMNIPAQUE IOHEXOL 300 MG/ML  SOLN  COMPARISON:  Multiple exams, including 01/05/2014  FINDINGS: CT CHEST FINDINGS  5 mm hypodense right thyroid nodule.  Thoracic adenopathy observed with conglomerate right lower paratracheal nodes have a short axis diameter of 1.2 cm (formerly 1.3 cm on the PET-CT of 01/05/2014) on image 18 of series 2. The right upper lobe pulmonary nodule measures 2.2 by 1.9 cm on image 19 of series 4 (formerly 2.3 by 1.7 cm). A right upper paratracheal lymph node measures 0.6 cm in short axis on image 13 of series 2.  A right axillary lymph node which previously had a short axis parenchymal thickness of 1.2 cm currently has a short axis parenchymal thickness of 0.6 cm on image 12 of series 2 (this excludes the fatty hilum).  The hypermetabolic right breast mass is less bulky in size and indistinctly marginated on today's exam. The degree of skin thickening and subcutaneous edema in the vicinity of the mass is significantly reduced.  Rim calcified of 1.4 cm lesion with fatty center noted at the 12 o'clock position in the left breast just above the areola.  There is a subcarinal node measuring 1 cm in short axis, image 25 series 2, previously not readily measurable. A right infrahilar node just below the bronchus intermedius measures 1.2 cm in short axis (formerly 1.0 cm) but was not formerly hypermetabolic  A lower paraesophageal lymph node measures 1.0 cm in short axis on image 37 of series 2 (formerly 0.9 cm and not previously discernibly hypermetabolic).  Centrilobular emphysema. The  known scattered osseous metastatic lesions throughout the thoracic skeleton rib fairly inconspicuous on CT, and despite being widespread are fairly difficult to compare to prior exams given the lack of easily identified individual lesions. Mild atelectasis or scarring in both lower lobes along the hemidiaphragms.  CT ABDOMEN AND PELVIS FINDINGS  Hepatobiliary: Dilated CBD at 1.7 cm with blunt termination near the ampulla. Dorsal  pancreatic duct is not dilated. Equivocal intrahepatic biliary dilatation. Gallbladder surgically absent. Diffuse hepatic steatosis.  Pancreas: Unremarkable  Spleen: Unremarkable  Adrenals/Urinary Tract: Unremarkable  Stomach/Bowel: Sigmoid diverticulosis without active diverticulitis. Prominent stool throughout the colon favors constipation.  Vascular/Lymphatic: Aortoiliac atherosclerotic vascular disease. 0.7 cm left external iliac lymph node, not previously hypermetabolic, similar in size to prior.  Reproductive: Uterus absent.  Ovaries not well seen.  Other: No supplemental non-categorized findings.  Musculoskeletal: As in the chest, there is vague heterogeneity in the osseous structures indicative of osseous metastatic disease, but difficult to quantify. Bone scan would probably be a better way to followup.  IMPRESSION: 1. Primarily improved appearance, with significant reduction in size of the right breast mass and in the surrounding edema, and considerable reduction in the right hilar and subpectoral adenopathy. 2. Essentially stable appearance of the right upper lobe pulmonary nodule the and similar appearance of the right lower paratracheal adenopathy. This may be a lung primary. 3. Slight enlargement of a right infrahilar lymph node currently 1.2 cm in short axis, but not formerly hypermetabolic. Equivocal enlargement of a lower periesophageal lymph node in the thorax. 4. Aside from the indistinct diffuse osseous metastatic disease, no specific findings of malignancy in the  abdomen/pelvis. 5. Centrilobular emphysema. 6. Stably dilated common bile duct with blunt termination near the ampulla. Cannot completely exclude a stricture although a component of the biliary dilatation may be due to prior cholecystectomy. 7. Sigmoid diverticulosis. 8.  Prominent stool throughout the colon favors constipation.   Electronically Signed   By: Sherryl Barters M.D.   On: 04/03/2014 09:12   Ct Abdomen Pelvis W Contrast  04/03/2014   CLINICAL DATA:  Subsequent treatment strategy for breast cancer. Right breast cancer diagnosed July, 2015 with metastatic disease to brain and bone. Ongoing chemotherapy. Prior left breast cancer in 2009.  EXAM: CT CHEST, ABDOMEN, AND PELVIS WITH CONTRAST  TECHNIQUE: Multidetector CT imaging of the chest, abdomen and pelvis was performed following the standard protocol during bolus administration of intravenous contrast.  CONTRAST:  32mL OMNIPAQUE IOHEXOL 300 MG/ML  SOLN  COMPARISON:  Multiple exams, including 01/05/2014  FINDINGS: CT CHEST FINDINGS  5 mm hypodense right thyroid nodule.  Thoracic adenopathy observed with conglomerate right lower paratracheal nodes have a short axis diameter of 1.2 cm (formerly 1.3 cm on the PET-CT of 01/05/2014) on image 18 of series 2. The right upper lobe pulmonary nodule measures 2.2 by 1.9 cm on image 19 of series 4 (formerly 2.3 by 1.7 cm). A right upper paratracheal lymph node measures 0.6 cm in short axis on image 13 of series 2.  A right axillary lymph node which previously had a short axis parenchymal thickness of 1.2 cm currently has a short axis parenchymal thickness of 0.6 cm on image 12 of series 2 (this excludes the fatty hilum).  The hypermetabolic right breast mass is less bulky in size and indistinctly marginated on today's exam. The degree of skin thickening and subcutaneous edema in the vicinity of the mass is significantly reduced.  Rim calcified of 1.4 cm lesion with fatty center noted at the 12 o'clock position in  the left breast just above the areola.  There is a subcarinal node measuring 1 cm in short axis, image 25 series 2, previously not readily measurable. A right infrahilar node just below the bronchus intermedius measures 1.2 cm in short axis (formerly 1.0 cm) but was not formerly hypermetabolic  A lower paraesophageal lymph node measures 1.0 cm in short  axis on image 37 of series 2 (formerly 0.9 cm and not previously discernibly hypermetabolic).  Centrilobular emphysema. The known scattered osseous metastatic lesions throughout the thoracic skeleton rib fairly inconspicuous on CT, and despite being widespread are fairly difficult to compare to prior exams given the lack of easily identified individual lesions. Mild atelectasis or scarring in both lower lobes along the hemidiaphragms.  CT ABDOMEN AND PELVIS FINDINGS  Hepatobiliary: Dilated CBD at 1.7 cm with blunt termination near the ampulla. Dorsal pancreatic duct is not dilated. Equivocal intrahepatic biliary dilatation. Gallbladder surgically absent. Diffuse hepatic steatosis.  Pancreas: Unremarkable  Spleen: Unremarkable  Adrenals/Urinary Tract: Unremarkable  Stomach/Bowel: Sigmoid diverticulosis without active diverticulitis. Prominent stool throughout the colon favors constipation.  Vascular/Lymphatic: Aortoiliac atherosclerotic vascular disease. 0.7 cm left external iliac lymph node, not previously hypermetabolic, similar in size to prior.  Reproductive: Uterus absent.  Ovaries not well seen.  Other: No supplemental non-categorized findings.  Musculoskeletal: As in the chest, there is vague heterogeneity in the osseous structures indicative of osseous metastatic disease, but difficult to quantify. Bone scan would probably be a better way to followup.  IMPRESSION: 1. Primarily improved appearance, with significant reduction in size of the right breast mass and in the surrounding edema, and considerable reduction in the right hilar and subpectoral adenopathy. 2.  Essentially stable appearance of the right upper lobe pulmonary nodule the and similar appearance of the right lower paratracheal adenopathy. This may be a lung primary. 3. Slight enlargement of a right infrahilar lymph node currently 1.2 cm in short axis, but not formerly hypermetabolic. Equivocal enlargement of a lower periesophageal lymph node in the thorax. 4. Aside from the indistinct diffuse osseous metastatic disease, no specific findings of malignancy in the abdomen/pelvis. 5. Centrilobular emphysema. 6. Stably dilated common bile duct with blunt termination near the ampulla. Cannot completely exclude a stricture although a component of the biliary dilatation may be due to prior cholecystectomy. 7. Sigmoid diverticulosis. 8.  Prominent stool throughout the colon favors constipation.   Electronically Signed   By: Sherryl Barters M.D.   On: 04/03/2014 09:12     EKG Interpretation None      MDM   Final diagnoses:  None   Patient given IV fluids and pain meds here feels better. Patient has a known resting tachycardia. Discuss with radiology and they state that there is no evidence of central or branch PE. Patient stable for discharge at this time. We'll followup with her oncologist in 2 days     Leota Jacobsen, MD 04/03/14 2344

## 2014-04-04 ENCOUNTER — Encounter: Payer: Self-pay | Admitting: Radiation Oncology

## 2014-04-04 LAB — URINE CULTURE
Colony Count: NO GROWTH
Culture: NO GROWTH

## 2014-04-04 NOTE — Progress Notes (Signed)
Radiation Oncology         [phone]) [phone] ________________________________  Name: [name] MRN: [id number]  Date: 04/03/2014  DOB: Sep 20, 1955  Follow-Up Visit Note  CC: Lorayne Marek, MD  Lorayne Marek, MD  Diagnosis:   Metastatic breast cancer  Interval Since Last Radiation:  The patient completed radiosurgery on 01/13/2014   Narrative:  The patient returns today for routine follow-up.  The patient had a recent MRI scan of the brain on 03/31/2014. She had this scan reviewed in brain/spine conference earlier today. The left frontal lobe mass was decreased and no new lesions were seen. This therefore was a good clinical scan. She comes in today to review this.  The patient complains of some generalized pain. She has nonspecific pain described today which is migratory in nature. She is taking pain medication currently and is scheduled to see medical oncology in the near future. She also saw them on 03/27/2014.                              ALLERGIES:  is allergic to penicillins.  Meds: Current Outpatient Prescriptions  Medication Sig Dispense Refill  . albuterol (PROVENTIL HFA;VENTOLIN HFA) 108 (90 BASE) MCG/ACT inhaler Inhale 2 puffs into the lungs every 6 (six) hours as needed for wheezing or shortness of breath. For shortness of breath  3 Inhaler  3  . amLODipine (NORVASC) 2.5 MG tablet Take 1 tablet (2.5 mg total) by mouth daily.  90 tablet  3  . butalbital-acetaminophen-caffeine (FIORICET) 50-325-40 MG per tablet Take 1-2 tablets by mouth every 6 (six) hours as needed for headache.  20 tablet  0  . carbamide peroxide (DEBROX) 6.5 % otic solution Place 5 drops into both ears 2 (two) times daily.  15 mL  1  . cyclobenzaprine (FLEXERIL) 5 MG tablet Take 1 tablet (5 mg total) by mouth 3 (three) times daily as needed for muscle spasms.  30 tablet  0  . Diphenhyd-Hydrocort-Nystatin (FIRST-DUKES MOUTHWASH) SUSP Use as directed 5 mLs in the mouth or throat 4 (four) times daily. Swish,  gargle and spit one to two teaspoonfuls every six hours as needed. May be swallowed if esophageal involvement.  120 mL  1  . fluticasone (FLOVENT HFA) 220 MCG/ACT inhaler Inhale 1 puff into the lungs 2 (two) times daily.  3 Inhaler  3  . lidocaine-prilocaine (EMLA) cream Apply 1 application topically as needed. Apply over port area 1-2 hours before chemo, cover with plastic wrap  30 g  0  . LORazepam (ATIVAN) 0.5 MG tablet Take 1 tablet (0.5 mg total) by mouth every 8 (eight) hours.  30 tablet  0  . ondansetron (ZOFRAN) 8 MG tablet Take 1 tablet (8 mg total) by mouth 2 (two) times daily. Start the day after chemo for 2 days. Then take as needed for nausea or vomiting.  30 tablet  1  . oxyCODONE-acetaminophen (ROXICET) 5-325 MG per tablet Take 1-2 tablets by mouth every 4 (four) hours as needed.  50 tablet  0  . pantoprazole (PROTONIX) 40 MG tablet Take 1 tablet (40 mg total) by mouth daily.  30 tablet  2  . prochlorperazine (COMPAZINE) 10 MG tablet Take 1 tablet (10 mg total) by mouth every 6 (six) hours as needed (Nausea or vomiting).  30 tablet  1  . cholecalciferol (VITAMIN D) 1000 UNITS tablet Take 2,000 Units by mouth daily.      . hydrocortisone (ANUSOL-HC) 25  MG suppository Place 25 mg rectally 2 (two) times daily as needed.       No current facility-administered medications for this encounter.    Physical Findings: The patient is in no acute distress. Patient is alert and oriented.  height is 4\' 11"  (1.499 m) and weight is 123 lb 14.4 oz (56.201 kg). Her oral temperature is 99.3 F (37.4 C). Her blood pressure is 144/70 and her pulse is 119. Her respiration is 22 and oxygen saturation is 92%. .     Lab Findings: Lab Results  Component Value Date   WBC 12.0* 04/03/2014   HGB 10.3* 04/03/2014   HCT 31.8* 04/03/2014   MCV 96.1 04/03/2014   PLT 241 04/03/2014     Radiographic Findings: Dg Chest 2 View (if Patient Has Fever And/or Copd)  04/03/2014   CLINICAL DATA:  58 year old  female with current history of breast cancer diagnosed in July, metastatic disease to brain and bone. Ongoing chemotherapy. Generalized pain including mid chest pain. Shortness of breath and cough. Initial encounter.  EXAM: CHEST  2 VIEW  COMPARISON:  CT chest and CT Abdomen and Pelvis from 0841 hr today, and earlier.  FINDINGS: Stable left chest porta cath. Lower lung volumes with linear bibasilar opacity which most resembles atelectasis. Right suprahilar mass shows radiographic regression since 12/30/2013. No pneumothorax or pulmonary edema. Suggestion of small pleural effusions on these films, but there was non by CT earlier today. Stable cardiac size and mediastinal contours. Visualized tracheal air column is within normal limits.  Stable visualized osseous structures. Retained oral contrast in the colon.  IMPRESSION: Lower lung volumes with bibasilar atelectasis, and possibly new small pleural effusions since the chest abdomen and pelvis CTs done at 0841 hr today. See also that CT report.   Electronically Signed   By: Lars Pinks M.D.   On: 04/03/2014 20:13   Ct Chest W Contrast  04/03/2014   CLINICAL DATA:  Subsequent treatment strategy for breast cancer. Right breast cancer diagnosed July, 2015 with metastatic disease to brain and bone. Ongoing chemotherapy. Prior left breast cancer in 2009.  EXAM: CT CHEST, ABDOMEN, AND PELVIS WITH CONTRAST  TECHNIQUE: Multidetector CT imaging of the chest, abdomen and pelvis was performed following the standard protocol during bolus administration of intravenous contrast.  CONTRAST:  31mL OMNIPAQUE IOHEXOL 300 MG/ML  SOLN  COMPARISON:  Multiple exams, including 01/05/2014  FINDINGS: CT CHEST FINDINGS  5 mm hypodense right thyroid nodule.  Thoracic adenopathy observed with conglomerate right lower paratracheal nodes have a short axis diameter of 1.2 cm (formerly 1.3 cm on the PET-CT of 01/05/2014) on image 18 of series 2. The right upper lobe pulmonary nodule measures 2.2  by 1.9 cm on image 19 of series 4 (formerly 2.3 by 1.7 cm). A right upper paratracheal lymph node measures 0.6 cm in short axis on image 13 of series 2.  A right axillary lymph node which previously had a short axis parenchymal thickness of 1.2 cm currently has a short axis parenchymal thickness of 0.6 cm on image 12 of series 2 (this excludes the fatty hilum).  The hypermetabolic right breast mass is less bulky in size and indistinctly marginated on today's exam. The degree of skin thickening and subcutaneous edema in the vicinity of the mass is significantly reduced.  Rim calcified of 1.4 cm lesion with fatty center noted at the 12 o'clock position in the left breast just above the areola.  There is a subcarinal node measuring 1 cm in  short axis, image 25 series 2, previously not readily measurable. A right infrahilar node just below the bronchus intermedius measures 1.2 cm in short axis (formerly 1.0 cm) but was not formerly hypermetabolic  A lower paraesophageal lymph node measures 1.0 cm in short axis on image 37 of series 2 (formerly 0.9 cm and not previously discernibly hypermetabolic).  Centrilobular emphysema. The known scattered osseous metastatic lesions throughout the thoracic skeleton rib fairly inconspicuous on CT, and despite being widespread are fairly difficult to compare to prior exams given the lack of easily identified individual lesions. Mild atelectasis or scarring in both lower lobes along the hemidiaphragms.  CT ABDOMEN AND PELVIS FINDINGS  Hepatobiliary: Dilated CBD at 1.7 cm with blunt termination near the ampulla. Dorsal pancreatic duct is not dilated. Equivocal intrahepatic biliary dilatation. Gallbladder surgically absent. Diffuse hepatic steatosis.  Pancreas: Unremarkable  Spleen: Unremarkable  Adrenals/Urinary Tract: Unremarkable  Stomach/Bowel: Sigmoid diverticulosis without active diverticulitis. Prominent stool throughout the colon favors constipation.  Vascular/Lymphatic:  Aortoiliac atherosclerotic vascular disease. 0.7 cm left external iliac lymph node, not previously hypermetabolic, similar in size to prior.  Reproductive: Uterus absent.  Ovaries not well seen.  Other: No supplemental non-categorized findings.  Musculoskeletal: As in the chest, there is vague heterogeneity in the osseous structures indicative of osseous metastatic disease, but difficult to quantify. Bone scan would probably be a better way to followup.  IMPRESSION: 1. Primarily improved appearance, with significant reduction in size of the right breast mass and in the surrounding edema, and considerable reduction in the right hilar and subpectoral adenopathy. 2. Essentially stable appearance of the right upper lobe pulmonary nodule the and similar appearance of the right lower paratracheal adenopathy. This may be a lung primary. 3. Slight enlargement of a right infrahilar lymph node currently 1.2 cm in short axis, but not formerly hypermetabolic. Equivocal enlargement of a lower periesophageal lymph node in the thorax. 4. Aside from the indistinct diffuse osseous metastatic disease, no specific findings of malignancy in the abdomen/pelvis. 5. Centrilobular emphysema. 6. Stably dilated common bile duct with blunt termination near the ampulla. Cannot completely exclude a stricture although a component of the biliary dilatation may be due to prior cholecystectomy. 7. Sigmoid diverticulosis. 8.  Prominent stool throughout the colon favors constipation.   Electronically Signed   By: Sherryl Barters M.D.   On: 04/03/2014 09:12   Mr Jeri Cos PJ Contrast  03/31/2014   CLINICAL DATA:  Right breast cancer. Metastatic right axillary lymph node. Metastatic disease the brain. SRS.  EXAM: MRI HEAD WITHOUT AND WITH CONTRAST  TECHNIQUE: Multiplanar, multiecho pulse sequences of the brain and surrounding structures were obtained without and with intravenous contrast.  CONTRAST:  79mL MULTIHANCE GADOBENATE DIMEGLUMINE 529 MG/ML  IV SOLN  COMPARISON:  CT head without contrast 8/6 teen/ 15 and 01/06/2014.  FINDINGS: The previously noted left frontal lobe mass is decreased in size, now measuring 7 x 11 x 8 mm. Adjacent edema has decreased as well. Periventricular white matter changes bilaterally are otherwise stable. No additional enhancing lesions are present. No acute infarct or hemorrhage is evident.  Flow is present in the major intracranial arteries. The globes and orbits are intact. Mild mucosal thickening is present in the anterior ethmoid air cells and frontal sinuses. The mastoid air cells are clear.  IMPRESSION: 1. Decreased size of left frontal lobe mass lesion, now measuring 7 x 11 x 8 mm. 2. No new lesions. 3. Stable periventricular white matter changes.   Electronically Signed   By:  Lawrence Santiago M.D.   On: 03/31/2014 14:23   Ct Abdomen Pelvis W Contrast  04/03/2014   CLINICAL DATA:  Subsequent treatment strategy for breast cancer. Right breast cancer diagnosed July, 2015 with metastatic disease to brain and bone. Ongoing chemotherapy. Prior left breast cancer in 2009.  EXAM: CT CHEST, ABDOMEN, AND PELVIS WITH CONTRAST  TECHNIQUE: Multidetector CT imaging of the chest, abdomen and pelvis was performed following the standard protocol during bolus administration of intravenous contrast.  CONTRAST:  39mL OMNIPAQUE IOHEXOL 300 MG/ML  SOLN  COMPARISON:  Multiple exams, including 01/05/2014  FINDINGS: CT CHEST FINDINGS  5 mm hypodense right thyroid nodule.  Thoracic adenopathy observed with conglomerate right lower paratracheal nodes have a short axis diameter of 1.2 cm (formerly 1.3 cm on the PET-CT of 01/05/2014) on image 18 of series 2. The right upper lobe pulmonary nodule measures 2.2 by 1.9 cm on image 19 of series 4 (formerly 2.3 by 1.7 cm). A right upper paratracheal lymph node measures 0.6 cm in short axis on image 13 of series 2.  A right axillary lymph node which previously had a short axis parenchymal thickness of 1.2  cm currently has a short axis parenchymal thickness of 0.6 cm on image 12 of series 2 (this excludes the fatty hilum).  The hypermetabolic right breast mass is less bulky in size and indistinctly marginated on today's exam. The degree of skin thickening and subcutaneous edema in the vicinity of the mass is significantly reduced.  Rim calcified of 1.4 cm lesion with fatty center noted at the 12 o'clock position in the left breast just above the areola.  There is a subcarinal node measuring 1 cm in short axis, image 25 series 2, previously not readily measurable. A right infrahilar node just below the bronchus intermedius measures 1.2 cm in short axis (formerly 1.0 cm) but was not formerly hypermetabolic  A lower paraesophageal lymph node measures 1.0 cm in short axis on image 37 of series 2 (formerly 0.9 cm and not previously discernibly hypermetabolic).  Centrilobular emphysema. The known scattered osseous metastatic lesions throughout the thoracic skeleton rib fairly inconspicuous on CT, and despite being widespread are fairly difficult to compare to prior exams given the lack of easily identified individual lesions. Mild atelectasis or scarring in both lower lobes along the hemidiaphragms.  CT ABDOMEN AND PELVIS FINDINGS  Hepatobiliary: Dilated CBD at 1.7 cm with blunt termination near the ampulla. Dorsal pancreatic duct is not dilated. Equivocal intrahepatic biliary dilatation. Gallbladder surgically absent. Diffuse hepatic steatosis.  Pancreas: Unremarkable  Spleen: Unremarkable  Adrenals/Urinary Tract: Unremarkable  Stomach/Bowel: Sigmoid diverticulosis without active diverticulitis. Prominent stool throughout the colon favors constipation.  Vascular/Lymphatic: Aortoiliac atherosclerotic vascular disease. 0.7 cm left external iliac lymph node, not previously hypermetabolic, similar in size to prior.  Reproductive: Uterus absent.  Ovaries not well seen.  Other: No supplemental non-categorized findings.   Musculoskeletal: As in the chest, there is vague heterogeneity in the osseous structures indicative of osseous metastatic disease, but difficult to quantify. Bone scan would probably be a better way to followup.  IMPRESSION: 1. Primarily improved appearance, with significant reduction in size of the right breast mass and in the surrounding edema, and considerable reduction in the right hilar and subpectoral adenopathy. 2. Essentially stable appearance of the right upper lobe pulmonary nodule the and similar appearance of the right lower paratracheal adenopathy. This may be a lung primary. 3. Slight enlargement of a right infrahilar lymph node currently 1.2 cm in short axis,  but not formerly hypermetabolic. Equivocal enlargement of a lower periesophageal lymph node in the thorax. 4. Aside from the indistinct diffuse osseous metastatic disease, no specific findings of malignancy in the abdomen/pelvis. 5. Centrilobular emphysema. 6. Stably dilated common bile duct with blunt termination near the ampulla. Cannot completely exclude a stricture although a component of the biliary dilatation may be due to prior cholecystectomy. 7. Sigmoid diverticulosis. 8.  Prominent stool throughout the colon favors constipation.   Electronically Signed   By: Sherryl Barters M.D.   On: 04/03/2014 09:12    Impression/plan:    The patient's MRI scan of the brain looked very good. We will continue followup through the brain program with a repeat MRI scan of the brain in 3 months. The patient is having some generalized pain and is going to discuss this further with medical oncology. No apparent diffuse progressive disease known which coincides with this complaint today.    Jodelle Gross, M.D., Ph.D.

## 2014-04-05 ENCOUNTER — Telehealth: Payer: Self-pay | Admitting: *Deleted

## 2014-04-05 ENCOUNTER — Ambulatory Visit (HOSPITAL_BASED_OUTPATIENT_CLINIC_OR_DEPARTMENT_OTHER): Payer: Medicaid Other | Admitting: Oncology

## 2014-04-05 ENCOUNTER — Telehealth: Payer: Self-pay | Admitting: Oncology

## 2014-04-05 ENCOUNTER — Ambulatory Visit: Payer: Medicaid Other | Admitting: Radiation Oncology

## 2014-04-05 VITALS — BP 142/73 | HR 77 | Temp 98.6°F | Resp 18 | Ht 59.0 in | Wt 115.5 lb

## 2014-04-05 DIAGNOSIS — C50811 Malignant neoplasm of overlapping sites of right female breast: Secondary | ICD-10-CM

## 2014-04-05 DIAGNOSIS — C7931 Secondary malignant neoplasm of brain: Secondary | ICD-10-CM

## 2014-04-05 DIAGNOSIS — C50411 Malignant neoplasm of upper-outer quadrant of right female breast: Secondary | ICD-10-CM

## 2014-04-05 DIAGNOSIS — C50912 Malignant neoplasm of unspecified site of left female breast: Secondary | ICD-10-CM

## 2014-04-05 DIAGNOSIS — J431 Panlobular emphysema: Secondary | ICD-10-CM

## 2014-04-05 DIAGNOSIS — F17200 Nicotine dependence, unspecified, uncomplicated: Secondary | ICD-10-CM

## 2014-04-05 DIAGNOSIS — C7951 Secondary malignant neoplasm of bone: Secondary | ICD-10-CM

## 2014-04-05 DIAGNOSIS — Z853 Personal history of malignant neoplasm of breast: Secondary | ICD-10-CM

## 2014-04-05 DIAGNOSIS — C773 Secondary and unspecified malignant neoplasm of axilla and upper limb lymph nodes: Secondary | ICD-10-CM

## 2014-04-05 DIAGNOSIS — C7800 Secondary malignant neoplasm of unspecified lung: Secondary | ICD-10-CM

## 2014-04-05 NOTE — Progress Notes (Signed)
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 The patient then had routine yearly mammography until 10/24/2009.  Sometime in 2014 the patient noted a mass in her right breast. She states she brought this to medical attention several times, although I do not see that reflected in the outside notes we have. In any case on 12/06/2013 the patient presented to her primary clinic with a complaint of left-sided breast pain. Exam showed the right breast however to be larger than the left. There was a thickened area versus lump at the 12:00 position in the right breast above the areola. There was also a mass in the right upper quadrant of the right breast. The patient was referred to the breast Center and on 6:30 at 2015 bilateral diagnostic mammography and right breast ultrasonography at the breast Center showed a 6 cm area in the right breast upper outer quadrant with increased density associated with branching calcifications. There was a separate area more medially that also appeared abnormal. There was diffuse skin thickening and interval enlarged right axillary lymph nodes. On exam the mammographer was able to palpate a 5 cm firm fixed palpable mass  in the upper-outer quadrant of the right breast, but mild skin redness. There was also a palpable right axillary lymph node. Ultrasound confirmed a large irregular hypoechoic mass at the 11:00 position of the right breast measuring 4.1 cm maximally. More laterally a separate mass in the 9:00 position measure 4.4 cm maximally. In the right axilla there were multiple abnormal appearing axillary lymph nodes.  Biopsy of both these suspicious masses in the right breast as well as one of the suspicious lymph nodes 12/21/2013 showed ([id number]) all 3 biopsies do show identical invasive ductal carcinoma, grade 3, triple negative, with an MIB-1 of 85%. On 12/26/2013 the patient underwent bilateral breast MRI. This showed an abnormal area measuring 7.2 cm in the central right breast involving or 4 quadrants and centered superiorly, with diffuse skin thickening as well as level I and 2 metastatic right axillary adenopathy. There was also an enlarged right internal mammary lymph node. The left side was unremarkable.   INTERVAL HISTORY: [name] returns today for follow up of her metastatic breast cancer, accompanied by her daughter, [name]. Today is day 15, cycle 4 of eribulin, given on day 1 and 8 of each 21 day cycle. Kynsleigh is tolerating the chemotherapy generally well. She denies nausea or vomiting problems. She has not had mouth sores. She has some numbness in her right hand at times, but no obvious progressive peripheral neuropathy issues. She has had no intercurrent fevers or bleeding. Her port is working well.  REVIEW OF SYSTEMS: [name] has significant back pain, and she takes oxycodone for this. She does not hydrate herself well during the day, drinking perhaps a couple of sodas in a couple of  cups of water a day. She is not walking much. She spends most of the day and home. She describes herself is very fatigued him a feeling chills, hurting in her chest and lower back, short of breath with almost any  activity, as a dry cough, feels forgetful, but denies depression. She just had restaging studies. She did not have any problems with the contrast. A detailed review of systems today was otherwise stable  PAST MEDICAL HISTORY: Past Medical History  Diagnosis Date  . Acid reflux   . Asthma   . COPD (chronic obstructive pulmonary disease)   . Cancer     lumpectomy, radiation 2009  . Seasonal allergies   . Alcohol abuse   . Elevated d-dimer     negative chest CT  . Allergy   . Emphysema of lung   . Hot flashes   . Reading disorder   . Impaired writing skills   . Wears glasses   . Wears dentures     top  . HOH (hard of hearing)     left ear  . S/P radiation therapy 01/13/14    SRS left parietal 20Gy    PAST SURGICAL HISTORY: Past Surgical History  Procedure Laterality Date  . Breast lumpectomy    . Abdominal hysterectomy    . Portacath placement Left 12/30/2013    Procedure: INSERTION PORT-A-CATH;  Surgeon: Merrie Roof, MD;  Location: [facility];  Service: General;  Laterality: Left;  subclavian area    FAMILY HISTORY Family History  Problem Relation Age of Onset  . Hypertension Other    the patient has little information about her father or his side of the family. The patient's mother died at the age of 31. The patient had 2 brothers and 4 sisters. There is no history of breast or ovarian cancer in the family to her knowledge.  GYNECOLOGIC HISTORY:  No LMP recorded. Patient has had a hysterectomy. The patient does not remember when she started menstruating. First live birth age 58. She is GX P1. She underwent hysterectomy approximately 12 years ago, but does not know whether she had bilateral salpingo-oophorectomy. 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 She is married but separated from her husband [name], who lives in [place]. The patient lives by herself, with no pets. Her  daughter [name] works as a Stage manager for [employer] care in [place] the patient has 3 grandchildren and 2 great-grandchildren. She is a Psychologist, forensic.    ADVANCED DIRECTIVES: Not in place; at her 12/28/2013 visit the patient was given the appropriate documents to complete and notarize so she may declare a healthcare power of attorney at her discretion   HEALTH MAINTENANCE: History  Substance Use Topics  . Smoking status: Current Every Day Smoker -- 1.00 packs/day for 30 years    Types: Cigarettes  . Smokeless tobacco: Not on file     Comment: down to 1-2 cigarettes daily6  . Alcohol Use: Yes     Comment: occ beer     Colonoscopy: Never  PAP: Status post hysterectomy  Bone density: Never  Lipid panel:  Allergies  Allergen Reactions  . Penicillins Other (See Comments)    Reaction a long time ago    Current Outpatient Prescriptions  Medication Sig Dispense Refill  . albuterol (PROVENTIL HFA;VENTOLIN HFA) 108 (90 BASE) MCG/ACT inhaler Inhale 2 puffs into the lungs every 6 (six) hours as  needed for wheezing or shortness of breath. For shortness of breath  3 Inhaler  3  . amLODipine (NORVASC) 2.5 MG tablet Take 1 tablet (2.5 mg total) by mouth daily.  90 tablet  3  . butalbital-acetaminophen-caffeine (FIORICET) 50-325-40 MG per tablet Take 1-2 tablets by mouth every 6 (six) hours as needed for headache.  20 tablet  0  . carbamide peroxide (DEBROX) 6.5 % otic solution Place 5 drops into both ears 2 (two) times daily.  15 mL  1  . cholecalciferol (VITAMIN D) 1000 UNITS tablet Take 2,000 Units by mouth daily.      . cyclobenzaprine (FLEXERIL) 5 MG tablet Take 1 tablet (5 mg total) by mouth 3 (three) times daily as needed for muscle spasms.  30 tablet  0  . Diphenhyd-Hydrocort-Nystatin (FIRST-DUKES MOUTHWASH) SUSP Use as directed 5 mLs in the mouth or throat 4 (four) times daily. Swish, gargle and spit one to two teaspoonfuls every six hours as needed. May be  swallowed if esophageal involvement.  120 mL  1  . fluticasone (FLOVENT HFA) 220 MCG/ACT inhaler Inhale 1 puff into the lungs 2 (two) times daily.  3 Inhaler  3  . hydrocortisone (ANUSOL-HC) 25 MG suppository Place 25 mg rectally 2 (two) times daily as needed.      . lidocaine-prilocaine (EMLA) cream Apply 1 application topically as needed. Apply over port area 1-2 hours before chemo, cover with plastic wrap  30 g  0  . LORazepam (ATIVAN) 0.5 MG tablet Take 1 tablet (0.5 mg total) by mouth every 8 (eight) hours.  30 tablet  0  . ondansetron (ZOFRAN) 8 MG tablet Take 1 tablet (8 mg total) by mouth 2 (two) times daily. Start the day after chemo for 2 days. Then take as needed for nausea or vomiting.  30 tablet  1  . oxyCODONE-acetaminophen (ROXICET) 5-325 MG per tablet Take 1-2 tablets by mouth every 4 (four) hours as needed.  50 tablet  0  . pantoprazole (PROTONIX) 40 MG tablet Take 1 tablet (40 mg total) by mouth daily.  30 tablet  2  . prochlorperazine (COMPAZINE) 10 MG tablet Take 1 tablet (10 mg total) by mouth every 6 (six) hours as needed (Nausea or vomiting).  30 tablet  1   No current facility-administered medications for this visit.    OBJECTIVE: Middle-aged [place] American woman who appears stated age 22 Vitals:   04/05/14 0910  BP: 142/73  Pulse: 77  Temp: 98.6 F (37 C)  Resp: 18     Body mass index is 23.32 kg/(m^2).    ECOG FS:2 - Symptomatic, <50% confined to bed  Sclerae unicteric, EOMs intact Oropharynx clear and slightly dry No cervical or supraclavicular adenopathy Lungs no rales or rhonchi Heart regular rate and rhythm Abd soft, nontender, positive bowel sounds MSK mild lumbar spinal tenderness, no upper extremity lymphedema Neuro: nonfocal, well oriented, appropriate affect Breasts: The mass in the right breast appear softer and smaller. It is movable. There is no skin or nipple involvement. I do not palpate any axillary adenopathy on the right the left breast  is status post remote lumpectomy. There is no evidence of local recurrence. The left axilla is benign.   LAB RESULTS:  Lab Results  Component Value Date   WBC 12.0* 04/03/2014   HGB 10.3* 04/03/2014   HCT 31.8* 04/03/2014   MCV 96.1 04/03/2014   PLT 241 04/03/2014     Chemistry      Component Value Date/Time  NA 135* 04/03/2014 2151   NA 138 03/27/2014 1021   K 4.1 04/03/2014 2151   K 3.9 03/27/2014 1021   CL 93* 04/03/2014 2151   CO2 26 04/03/2014 2151   CO2 23 03/27/2014 1021   BUN 11 04/03/2014 2151   BUN 6.2* 03/27/2014 1021   CREATININE 0.74 04/03/2014 2151   CREATININE 0.8 03/27/2014 1021   CREATININE 0.76 10/18/2013 1438      Component Value Date/Time   CALCIUM 10.4 04/03/2014 2151   CALCIUM 10.2 03/27/2014 1021   ALKPHOS 87 04/03/2014 2151   ALKPHOS 84 03/27/2014 1021   AST 30 04/03/2014 2151   AST 41* 03/27/2014 1021   ALT 9 04/03/2014 2151   ALT 20 03/27/2014 1021   BILITOT 0.3 04/03/2014 2151   BILITOT 0.27 03/27/2014 1021      STUDIES: Dg Chest 2 View (if Patient Has Fever And/or Copd)  04/03/2014   CLINICAL DATA:  58 year old female with current history of breast cancer diagnosed in July, metastatic disease to brain and bone. Ongoing chemotherapy. Generalized pain including mid chest pain. Shortness of breath and cough. Initial encounter.  EXAM: CHEST  2 VIEW  COMPARISON:  CT chest and CT Abdomen and Pelvis from 0841 hr today, and earlier.  FINDINGS: Stable left chest porta cath. Lower lung volumes with linear bibasilar opacity which most resembles atelectasis. Right suprahilar mass shows radiographic regression since 12/30/2013. No pneumothorax or pulmonary edema. Suggestion of small pleural effusions on these films, but there was non by CT earlier today. Stable cardiac size and mediastinal contours. Visualized tracheal air column is within normal limits.  Stable visualized osseous structures. Retained oral contrast in the colon.  IMPRESSION: Lower lung  volumes with bibasilar atelectasis, and possibly new small pleural effusions since the chest abdomen and pelvis CTs done at 0841 hr today. See also that CT report.   Electronically Signed   By: Lars Pinks M.D.   On: 04/03/2014 20:13   Ct Chest W Contrast  04/03/2014   CLINICAL DATA:  Subsequent treatment strategy for breast cancer. Right breast cancer diagnosed July, 2015 with metastatic disease to brain and bone. Ongoing chemotherapy. Prior left breast cancer in 2009.  EXAM: CT CHEST, ABDOMEN, AND PELVIS WITH CONTRAST  TECHNIQUE: Multidetector CT imaging of the chest, abdomen and pelvis was performed following the standard protocol during bolus administration of intravenous contrast.  CONTRAST:  18mL OMNIPAQUE IOHEXOL 300 MG/ML  SOLN  COMPARISON:  Multiple exams, including 01/05/2014  FINDINGS: CT CHEST FINDINGS  5 mm hypodense right thyroid nodule.  Thoracic adenopathy observed with conglomerate right lower paratracheal nodes have a short axis diameter of 1.2 cm (formerly 1.3 cm on the PET-CT of 01/05/2014) on image 18 of series 2. The right upper lobe pulmonary nodule measures 2.2 by 1.9 cm on image 19 of series 4 (formerly 2.3 by 1.7 cm). A right upper paratracheal lymph node measures 0.6 cm in short axis on image 13 of series 2.  A right axillary lymph node which previously had a short axis parenchymal thickness of 1.2 cm currently has a short axis parenchymal thickness of 0.6 cm on image 12 of series 2 (this excludes the fatty hilum).  The hypermetabolic right breast mass is less bulky in size and indistinctly marginated on today's exam. The degree of skin thickening and subcutaneous edema in the vicinity of the mass is significantly reduced.  Rim calcified of 1.4 cm lesion with fatty center noted at the 12 o'clock position in the left breast  just above the areola.  There is a subcarinal node measuring 1 cm in short axis, image 25 series 2, previously not readily measurable. A right infrahilar node just  below the bronchus intermedius measures 1.2 cm in short axis (formerly 1.0 cm) but was not formerly hypermetabolic  A lower paraesophageal lymph node measures 1.0 cm in short axis on image 37 of series 2 (formerly 0.9 cm and not previously discernibly hypermetabolic).  Centrilobular emphysema. The known scattered osseous metastatic lesions throughout the thoracic skeleton rib fairly inconspicuous on CT, and despite being widespread are fairly difficult to compare to prior exams given the lack of easily identified individual lesions. Mild atelectasis or scarring in both lower lobes along the hemidiaphragms.  CT ABDOMEN AND PELVIS FINDINGS  Hepatobiliary: Dilated CBD at 1.7 cm with blunt termination near the ampulla. Dorsal pancreatic duct is not dilated. Equivocal intrahepatic biliary dilatation. Gallbladder surgically absent. Diffuse hepatic steatosis.  Pancreas: Unremarkable  Spleen: Unremarkable  Adrenals/Urinary Tract: Unremarkable  Stomach/Bowel: Sigmoid diverticulosis without active diverticulitis. Prominent stool throughout the colon favors constipation.  Vascular/Lymphatic: Aortoiliac atherosclerotic vascular disease. 0.7 cm left external iliac lymph node, not previously hypermetabolic, similar in size to prior.  Reproductive: Uterus absent.  Ovaries not well seen.  Other: No supplemental non-categorized findings.  Musculoskeletal: As in the chest, there is vague heterogeneity in the osseous structures indicative of osseous metastatic disease, but difficult to quantify. Bone scan would probably be a better way to followup.  IMPRESSION: 1. Primarily improved appearance, with significant reduction in size of the right breast mass and in the surrounding edema, and considerable reduction in the right hilar and subpectoral adenopathy. 2. Essentially stable appearance of the right upper lobe pulmonary nodule the and similar appearance of the right lower paratracheal adenopathy. This may be a lung primary. 3. Slight  enlargement of a right infrahilar lymph node currently 1.2 cm in short axis, but not formerly hypermetabolic. Equivocal enlargement of a lower periesophageal lymph node in the thorax. 4. Aside from the indistinct diffuse osseous metastatic disease, no specific findings of malignancy in the abdomen/pelvis. 5. Centrilobular emphysema. 6. Stably dilated common bile duct with blunt termination near the ampulla. Cannot completely exclude a stricture although a component of the biliary dilatation may be due to prior cholecystectomy. 7. Sigmoid diverticulosis. 8.  Prominent stool throughout the colon favors constipation.   Electronically Signed   By: Sherryl Barters M.D.   On: 04/03/2014 09:12   Mr Jeri Cos WN Contrast  03/31/2014   CLINICAL DATA:  Right breast cancer. Metastatic right axillary lymph node. Metastatic disease the brain. SRS.  EXAM: MRI HEAD WITHOUT AND WITH CONTRAST  TECHNIQUE: Multiplanar, multiecho pulse sequences of the brain and surrounding structures were obtained without and with intravenous contrast.  CONTRAST:  15mL MULTIHANCE GADOBENATE DIMEGLUMINE 529 MG/ML IV SOLN  COMPARISON:  CT head without contrast 8/6 teen/ 15 and 01/06/2014.  FINDINGS: The previously noted left frontal lobe mass is decreased in size, now measuring 7 x 11 x 8 mm. Adjacent edema has decreased as well. Periventricular white matter changes bilaterally are otherwise stable. No additional enhancing lesions are present. No acute infarct or hemorrhage is evident.  Flow is present in the major intracranial arteries. The globes and orbits are intact. Mild mucosal thickening is present in the anterior ethmoid air cells and frontal sinuses. The mastoid air cells are clear.  IMPRESSION: 1. Decreased size of left frontal lobe mass lesion, now measuring 7 x 11 x 8 mm. 2. No new  lesions. 3. Stable periventricular white matter changes.   Electronically Signed   By: Lawrence Santiago M.D.   On: 03/31/2014 14:23   Ct Abdomen Pelvis W  Contrast  04/03/2014   CLINICAL DATA:  Subsequent treatment strategy for breast cancer. Right breast cancer diagnosed July, 2015 with metastatic disease to brain and bone. Ongoing chemotherapy. Prior left breast cancer in 2009.  EXAM: CT CHEST, ABDOMEN, AND PELVIS WITH CONTRAST  TECHNIQUE: Multidetector CT imaging of the chest, abdomen and pelvis was performed following the standard protocol during bolus administration of intravenous contrast.  CONTRAST:  58mL OMNIPAQUE IOHEXOL 300 MG/ML  SOLN  COMPARISON:  Multiple exams, including 01/05/2014  FINDINGS: CT CHEST FINDINGS  5 mm hypodense right thyroid nodule.  Thoracic adenopathy observed with conglomerate right lower paratracheal nodes have a short axis diameter of 1.2 cm (formerly 1.3 cm on the PET-CT of 01/05/2014) on image 18 of series 2. The right upper lobe pulmonary nodule measures 2.2 by 1.9 cm on image 19 of series 4 (formerly 2.3 by 1.7 cm). A right upper paratracheal lymph node measures 0.6 cm in short axis on image 13 of series 2.  A right axillary lymph node which previously had a short axis parenchymal thickness of 1.2 cm currently has a short axis parenchymal thickness of 0.6 cm on image 12 of series 2 (this excludes the fatty hilum).  The hypermetabolic right breast mass is less bulky in size and indistinctly marginated on today's exam. The degree of skin thickening and subcutaneous edema in the vicinity of the mass is significantly reduced.  Rim calcified of 1.4 cm lesion with fatty center noted at the 12 o'clock position in the left breast just above the areola.  There is a subcarinal node measuring 1 cm in short axis, image 25 series 2, previously not readily measurable. A right infrahilar node just below the bronchus intermedius measures 1.2 cm in short axis (formerly 1.0 cm) but was not formerly hypermetabolic  A lower paraesophageal lymph node measures 1.0 cm in short axis on image 37 of series 2 (formerly 0.9 cm and not previously  discernibly hypermetabolic).  Centrilobular emphysema. The known scattered osseous metastatic lesions throughout the thoracic skeleton rib fairly inconspicuous on CT, and despite being widespread are fairly difficult to compare to prior exams given the lack of easily identified individual lesions. Mild atelectasis or scarring in both lower lobes along the hemidiaphragms.  CT ABDOMEN AND PELVIS FINDINGS  Hepatobiliary: Dilated CBD at 1.7 cm with blunt termination near the ampulla. Dorsal pancreatic duct is not dilated. Equivocal intrahepatic biliary dilatation. Gallbladder surgically absent. Diffuse hepatic steatosis.  Pancreas: Unremarkable  Spleen: Unremarkable  Adrenals/Urinary Tract: Unremarkable  Stomach/Bowel: Sigmoid diverticulosis without active diverticulitis. Prominent stool throughout the colon favors constipation.  Vascular/Lymphatic: Aortoiliac atherosclerotic vascular disease. 0.7 cm left external iliac lymph node, not previously hypermetabolic, similar in size to prior.  Reproductive: Uterus absent.  Ovaries not well seen.  Other: No supplemental non-categorized findings.  Musculoskeletal: As in the chest, there is vague heterogeneity in the osseous structures indicative of osseous metastatic disease, but difficult to quantify. Bone scan would probably be a better way to followup.  IMPRESSION: 1. Primarily improved appearance, with significant reduction in size of the right breast mass and in the surrounding edema, and considerable reduction in the right hilar and subpectoral adenopathy. 2. Essentially stable appearance of the right upper lobe pulmonary nodule the and similar appearance of the right lower paratracheal adenopathy. This may be a lung primary. 3.  Slight enlargement of a right infrahilar lymph node currently 1.2 cm in short axis, but not formerly hypermetabolic. Equivocal enlargement of a lower periesophageal lymph node in the thorax. 4. Aside from the indistinct diffuse osseous metastatic  disease, no specific findings of malignancy in the abdomen/pelvis. 5. Centrilobular emphysema. 6. Stably dilated common bile duct with blunt termination near the ampulla. Cannot completely exclude a stricture although a component of the biliary dilatation may be due to prior cholecystectomy. 7. Sigmoid diverticulosis. 8.  Prominent stool throughout the colon favors constipation.   Electronically Signed   By: Sherryl Barters M.D.   On: 04/03/2014 09:12    ASSESSMENT: 58 y.o. [place] woman with stage IV breast cancer involving brain, lung, and bones   (1) status post right breast biopsy of 2 separate masses in the right axillary lymph node 12/21/2013 showing a clinical T3 N3, stage IIIC invasive ductal carcinoma, triple negative, with an MIB-1 of 85%; staging studies 01/04/2014 show metastatic disease to the brain, lung, and bones.  (2) status post stereotactic radiosurgery to a 1.7 cm left parietal lesion: 20 Gy given 01/13/2014  (3) systemic therapy consists of neoadjuvant eribulin, given days 1 and 8 of each 21 day cycle, started 01/17/2014  (4) zolendronate to be started once the patient's dental condition has been optimized. I am arranging for her to see a dentist.  (5) right mastectomy for local control to be considered once systemic disease comes under good control  (6) also status post left lumpectomy and sentinel lymph node sampling 11/23/2007 for a pTis pN0, stage 0 ductal carcinoma in situ, high-grade, estrogen and progesterone receptor negative, status post adjuvant radiation  PLAN: I spent approximately 50 minutes today with Feleica and her daughter going over [place] situation. We reviewed the fact that she has stage IV breast cancer, which is not curable. Furthermore she has brain involvement, which is not treated with systemic chemotherapy.   Fortunately Gwendola's cancer is responding to eribulin, and she is tolerating it well. Also there has been no progression in the brain. I  think the best plan at this point is to continue eribulin for another 4 cycles, which is going to take [place] through mid January. After that we will obtain restaging scans. If there has not been any brain progression, and if there continues to be response systemically, then I think we could consider a right mastectomy to forestall local spread of disease. Obviously if we see disease progression, and it looks like survival will be brief, there would be no 0.2 at surgery. They understand that.  Accordingly I have arranged for her to resume her eribulin next week. She will see [place] again in 2 weeks and then I will see her again in November 16. There was a little bit of fluid seen on the chest x-ray just obtained and I want to make sure there is no significant progression there.  I have asked Salia to walk twice daily, even if briefly, and to increase her fluid intake. We reviewed her medications and given her low blood pressure I am stopping her amlodipine at this point.  Alycea and her daughter have a good understanding of this plan. They agree to it. They know the goal of treatment is control. They will call with any problems that may develop before her next visit here.  Chauncey Cruel, MD  04/05/2014 9:15 AM

## 2014-04-05 NOTE — Telephone Encounter (Signed)
Per staff message and POF I have scheduled appts. Advised scheduler of appts. JMW  

## 2014-04-05 NOTE — Telephone Encounter (Signed)
per pof to sch pt appt-gave pt copy of sch-sent MW email to sch trmt-pt to get CXR tey are aware-will get updated copy of sch on 10/26

## 2014-04-10 ENCOUNTER — Emergency Department (HOSPITAL_COMMUNITY): Payer: Medicaid Other

## 2014-04-10 ENCOUNTER — Ambulatory Visit: Payer: Medicaid Other

## 2014-04-10 ENCOUNTER — Other Ambulatory Visit: Payer: Self-pay

## 2014-04-10 ENCOUNTER — Other Ambulatory Visit (HOSPITAL_BASED_OUTPATIENT_CLINIC_OR_DEPARTMENT_OTHER): Payer: Medicaid Other

## 2014-04-10 ENCOUNTER — Inpatient Hospital Stay (HOSPITAL_COMMUNITY)
Admission: EM | Admit: 2014-04-10 | Discharge: 2014-04-19 | DRG: 871 | Disposition: A | Discharge status: Home Health | Payer: Medicaid Other | Attending: Internal Medicine | Admitting: Internal Medicine

## 2014-04-10 ENCOUNTER — Encounter (HOSPITAL_COMMUNITY): Payer: Self-pay | Admitting: Emergency Medicine

## 2014-04-10 DIAGNOSIS — J9801 Acute bronchospasm: Secondary | ICD-10-CM | POA: Diagnosis not present

## 2014-04-10 DIAGNOSIS — A419 Sepsis, unspecified organism: Principal | ICD-10-CM

## 2014-04-10 DIAGNOSIS — R410 Disorientation, unspecified: Secondary | ICD-10-CM | POA: Diagnosis present

## 2014-04-10 DIAGNOSIS — J441 Chronic obstructive pulmonary disease with (acute) exacerbation: Secondary | ICD-10-CM

## 2014-04-10 DIAGNOSIS — J90 Pleural effusion, not elsewhere classified: Secondary | ICD-10-CM | POA: Diagnosis present

## 2014-04-10 DIAGNOSIS — C7951 Secondary malignant neoplasm of bone: Secondary | ICD-10-CM | POA: Diagnosis present

## 2014-04-10 DIAGNOSIS — J811 Chronic pulmonary edema: Secondary | ICD-10-CM | POA: Diagnosis present

## 2014-04-10 DIAGNOSIS — R64 Cachexia: Secondary | ICD-10-CM | POA: Diagnosis present

## 2014-04-10 DIAGNOSIS — K219 Gastro-esophageal reflux disease without esophagitis: Secondary | ICD-10-CM | POA: Diagnosis present

## 2014-04-10 DIAGNOSIS — I10 Essential (primary) hypertension: Secondary | ICD-10-CM

## 2014-04-10 DIAGNOSIS — D72829 Elevated white blood cell count, unspecified: Secondary | ICD-10-CM | POA: Insufficient documentation

## 2014-04-10 DIAGNOSIS — Y95 Nosocomial condition: Secondary | ICD-10-CM | POA: Diagnosis present

## 2014-04-10 DIAGNOSIS — K5900 Constipation, unspecified: Secondary | ICD-10-CM | POA: Insufficient documentation

## 2014-04-10 DIAGNOSIS — Z880 Allergy status to penicillin: Secondary | ICD-10-CM | POA: Diagnosis not present

## 2014-04-10 DIAGNOSIS — G9340 Encephalopathy, unspecified: Secondary | ICD-10-CM | POA: Diagnosis present

## 2014-04-10 DIAGNOSIS — D638 Anemia in other chronic diseases classified elsewhere: Secondary | ICD-10-CM | POA: Diagnosis present

## 2014-04-10 DIAGNOSIS — Z79899 Other long term (current) drug therapy: Secondary | ICD-10-CM

## 2014-04-10 DIAGNOSIS — J45909 Unspecified asthma, uncomplicated: Secondary | ICD-10-CM | POA: Diagnosis present

## 2014-04-10 DIAGNOSIS — C50411 Malignant neoplasm of upper-outer quadrant of right female breast: Secondary | ICD-10-CM

## 2014-04-10 DIAGNOSIS — R000 Tachycardia, unspecified: Secondary | ICD-10-CM

## 2014-04-10 DIAGNOSIS — J9620 Acute and chronic respiratory failure, unspecified whether with hypoxia or hypercapnia: Secondary | ICD-10-CM

## 2014-04-10 DIAGNOSIS — Z923 Personal history of irradiation: Secondary | ICD-10-CM

## 2014-04-10 DIAGNOSIS — K567 Ileus, unspecified: Secondary | ICD-10-CM | POA: Diagnosis present

## 2014-04-10 DIAGNOSIS — R6521 Severe sepsis with septic shock: Secondary | ICD-10-CM | POA: Diagnosis present

## 2014-04-10 DIAGNOSIS — R6510 Systemic inflammatory response syndrome (SIRS) of non-infectious origin without acute organ dysfunction: Secondary | ICD-10-CM | POA: Diagnosis present

## 2014-04-10 DIAGNOSIS — J42 Unspecified chronic bronchitis: Secondary | ICD-10-CM

## 2014-04-10 DIAGNOSIS — F17210 Nicotine dependence, cigarettes, uncomplicated: Secondary | ICD-10-CM | POA: Diagnosis present

## 2014-04-10 DIAGNOSIS — J449 Chronic obstructive pulmonary disease, unspecified: Secondary | ICD-10-CM | POA: Diagnosis present

## 2014-04-10 DIAGNOSIS — Z79891 Long term (current) use of opiate analgesic: Secondary | ICD-10-CM

## 2014-04-10 DIAGNOSIS — J189 Pneumonia, unspecified organism: Secondary | ICD-10-CM | POA: Diagnosis present

## 2014-04-10 DIAGNOSIS — Z8249 Family history of ischemic heart disease and other diseases of the circulatory system: Secondary | ICD-10-CM | POA: Diagnosis not present

## 2014-04-10 DIAGNOSIS — H9190 Unspecified hearing loss, unspecified ear: Secondary | ICD-10-CM | POA: Diagnosis present

## 2014-04-10 DIAGNOSIS — M25471 Effusion, right ankle: Secondary | ICD-10-CM

## 2014-04-10 DIAGNOSIS — R109 Unspecified abdominal pain: Secondary | ICD-10-CM

## 2014-04-10 DIAGNOSIS — C50919 Malignant neoplasm of unspecified site of unspecified female breast: Secondary | ICD-10-CM | POA: Diagnosis present

## 2014-04-10 DIAGNOSIS — Z6823 Body mass index (BMI) 23.0-23.9, adult: Secondary | ICD-10-CM

## 2014-04-10 DIAGNOSIS — E0781 Sick-euthyroid syndrome: Secondary | ICD-10-CM | POA: Diagnosis present

## 2014-04-10 DIAGNOSIS — J438 Other emphysema: Secondary | ICD-10-CM

## 2014-04-10 DIAGNOSIS — Z9289 Personal history of other medical treatment: Secondary | ICD-10-CM

## 2014-04-10 DIAGNOSIS — E860 Dehydration: Secondary | ICD-10-CM | POA: Diagnosis present

## 2014-04-10 DIAGNOSIS — E8889 Other specified metabolic disorders: Secondary | ICD-10-CM | POA: Diagnosis present

## 2014-04-10 DIAGNOSIS — J9621 Acute and chronic respiratory failure with hypoxia: Secondary | ICD-10-CM

## 2014-04-10 DIAGNOSIS — M25472 Effusion, left ankle: Secondary | ICD-10-CM

## 2014-04-10 DIAGNOSIS — J9690 Respiratory failure, unspecified, unspecified whether with hypoxia or hypercapnia: Secondary | ICD-10-CM

## 2014-04-10 DIAGNOSIS — E872 Acidosis: Secondary | ICD-10-CM | POA: Diagnosis present

## 2014-04-10 DIAGNOSIS — C50911 Malignant neoplasm of unspecified site of right female breast: Secondary | ICD-10-CM

## 2014-04-10 DIAGNOSIS — R079 Chest pain, unspecified: Secondary | ICD-10-CM

## 2014-04-10 DIAGNOSIS — R0902 Hypoxemia: Secondary | ICD-10-CM

## 2014-04-10 LAB — COMPREHENSIVE METABOLIC PANEL (CC13)
ALBUMIN: 3.2 g/dL — AB (ref 3.5–5.0)
ALK PHOS: 107 U/L (ref 40–150)
ALT: 19 U/L (ref 0–55)
AST: 90 U/L — ABNORMAL HIGH (ref 5–34)
Anion Gap: 16 mEq/L — ABNORMAL HIGH (ref 3–11)
BUN: 22.6 mg/dL (ref 7.0–26.0)
CO2: 18 mEq/L — ABNORMAL LOW (ref 22–29)
Calcium: 12.8 mg/dL — ABNORMAL HIGH (ref 8.4–10.4)
Chloride: 100 mEq/L (ref 98–109)
Creatinine: 1.2 mg/dL — ABNORMAL HIGH (ref 0.6–1.1)
GLUCOSE: 101 mg/dL (ref 70–140)
Potassium: 4.5 mEq/L (ref 3.5–5.1)
SODIUM: 135 meq/L — AB (ref 136–145)
TOTAL PROTEIN: 7.9 g/dL (ref 6.4–8.3)
Total Bilirubin: 0.49 mg/dL (ref 0.20–1.20)

## 2014-04-10 LAB — COMPREHENSIVE METABOLIC PANEL
ALK PHOS: 108 U/L (ref 39–117)
ALT: 15 U/L (ref 0–35)
AST: 87 U/L — ABNORMAL HIGH (ref 0–37)
Albumin: 3.4 g/dL — ABNORMAL LOW (ref 3.5–5.2)
Anion gap: 23 — ABNORMAL HIGH (ref 5–15)
BUN: 23 mg/dL (ref 6–23)
CALCIUM: 12.2 mg/dL — AB (ref 8.4–10.5)
CO2: 17 mEq/L — ABNORMAL LOW (ref 19–32)
Chloride: 92 mEq/L — ABNORMAL LOW (ref 96–112)
Creatinine, Ser: 1.1 mg/dL (ref 0.50–1.10)
GFR calc Af Amer: 62 mL/min — ABNORMAL LOW (ref >90)
GFR calc non Af Amer: 54 mL/min — ABNORMAL LOW (ref >90)
Glucose, Bld: 94 mg/dL (ref 70–99)
POTASSIUM: 4 meq/L (ref 3.7–5.3)
SODIUM: 132 meq/L — AB (ref 137–147)
Total Bilirubin: 0.4 mg/dL (ref 0.3–1.2)
Total Protein: 7.9 g/dL (ref 6.0–8.3)

## 2014-04-10 LAB — BLOOD GAS, VENOUS
Acid-base deficit: 4.7 mmol/L — ABNORMAL HIGH (ref 0.0–2.0)
Bicarbonate: 17.5 mEq/L — ABNORMAL LOW (ref 20.0–24.0)
O2 Saturation: 91.1 %
PCO2 VEN: 25.1 mmHg — AB (ref 45.0–50.0)
PH VEN: 7.459 — AB (ref 7.250–7.300)
PO2 VEN: 62.8 mmHg — AB (ref 30.0–45.0)
Patient temperature: 98.6
TCO2: 15.9 mmol/L (ref 0–100)

## 2014-04-10 LAB — URINE MICROSCOPIC-ADD ON

## 2014-04-10 LAB — CREATININE, SERUM
Creatinine, Ser: 1.04 mg/dL (ref 0.50–1.10)
GFR calc Af Amer: 67 mL/min — ABNORMAL LOW (ref >90)
GFR calc non Af Amer: 58 mL/min — ABNORMAL LOW (ref >90)

## 2014-04-10 LAB — CBC WITH DIFFERENTIAL/PLATELET
BASO%: 0.4 % (ref 0.0–2.0)
BASOS ABS: 0.1 10*3/uL (ref 0.0–0.1)
BASOS ABS: 0 10*3/uL (ref 0.0–0.1)
Basophils Relative: 0 % (ref 0–1)
EOS%: 0 % (ref 0.0–7.0)
Eosinophils Absolute: 0 10*3/uL (ref 0.0–0.5)
Eosinophils Absolute: 0 10*3/uL (ref 0.0–0.7)
Eosinophils Relative: 0 % (ref 0–5)
HCT: 33.7 % — ABNORMAL LOW (ref 34.8–46.6)
HCT: 32 % — ABNORMAL LOW (ref 36.0–46.0)
HEMOGLOBIN: 11.2 g/dL — AB (ref 11.6–15.9)
Hemoglobin: 10.7 g/dL — ABNORMAL LOW (ref 12.0–15.0)
LYMPH#: 1.5 10*3/uL (ref 0.9–3.3)
LYMPH%: 8.8 % — ABNORMAL LOW (ref 14.0–49.7)
LYMPHS PCT: 9 % — AB (ref 12–46)
Lymphs Abs: 1.5 10*3/uL (ref 0.7–4.0)
MCH: 30.7 pg (ref 25.1–34.0)
MCH: 30.7 pg (ref 26.0–34.0)
MCHC: 33.2 g/dL (ref 31.5–36.0)
MCHC: 33.4 g/dL (ref 30.0–36.0)
MCV: 92.3 fL (ref 79.5–101.0)
MCV: 92 fL (ref 78.0–100.0)
MONO#: 1.5 10*3/uL — ABNORMAL HIGH (ref 0.1–0.9)
MONO%: 9.1 % (ref 0.0–14.0)
MONOS PCT: 10 % (ref 3–12)
Monocytes Absolute: 1.6 10*3/uL — ABNORMAL HIGH (ref 0.1–1.0)
NEUT%: 81.7 % — ABNORMAL HIGH (ref 38.4–76.8)
NEUTROS ABS: 13.6 10*3/uL — AB (ref 1.5–6.5)
NRBC: 1 % — AB (ref 0–0)
Neutro Abs: 13.3 10*3/uL — ABNORMAL HIGH (ref 1.7–7.7)
Neutrophils Relative %: 81 % — ABNORMAL HIGH (ref 43–77)
PLATELETS: 227 10*3/uL (ref 150–400)
Platelets: 222 10*3/uL (ref 145–400)
RBC: 3.65 10*6/uL — ABNORMAL LOW (ref 3.70–5.45)
RBC: 3.48 MIL/uL — ABNORMAL LOW (ref 3.87–5.11)
RDW: 19.5 % — ABNORMAL HIGH (ref 11.2–14.5)
RDW: 19.4 % — AB (ref 11.5–15.5)
WBC: 16.7 10*3/uL — ABNORMAL HIGH (ref 3.9–10.3)
WBC: 16.4 10*3/uL — AB (ref 4.0–10.5)

## 2014-04-10 LAB — URINALYSIS, ROUTINE W REFLEX MICROSCOPIC
Bilirubin Urine: NEGATIVE
Glucose, UA: NEGATIVE mg/dL
Ketones, ur: NEGATIVE mg/dL
LEUKOCYTES UA: NEGATIVE
Nitrite: NEGATIVE
Protein, ur: 30 mg/dL — AB
SPECIFIC GRAVITY, URINE: 1.02 (ref 1.005–1.030)
UROBILINOGEN UA: 0.2 mg/dL (ref 0.0–1.0)
pH: 5 (ref 5.0–8.0)

## 2014-04-10 LAB — CBC
HEMATOCRIT: 28 % — AB (ref 36.0–46.0)
Hemoglobin: 9 g/dL — ABNORMAL LOW (ref 12.0–15.0)
MCH: 30.4 pg (ref 26.0–34.0)
MCHC: 32.1 g/dL (ref 30.0–36.0)
MCV: 94.6 fL (ref 78.0–100.0)
Platelets: 188 10*3/uL (ref 150–400)
RBC: 2.96 MIL/uL — ABNORMAL LOW (ref 3.87–5.11)
RDW: 19.3 % — AB (ref 11.5–15.5)
WBC: 14.6 10*3/uL — ABNORMAL HIGH (ref 4.0–10.5)

## 2014-04-10 LAB — I-STAT CG4 LACTIC ACID, ED
Lactic Acid, Venous: 1.9 mmol/L (ref 0.5–2.2)
Lactic Acid, Venous: 0.82 mmol/L (ref 0.5–2.2)

## 2014-04-10 LAB — TECHNOLOGIST REVIEW

## 2014-04-10 LAB — PRO B NATRIURETIC PEPTIDE: Pro B Natriuretic peptide (BNP): 298.3 pg/mL — ABNORMAL HIGH (ref 0–125)

## 2014-04-10 LAB — AMMONIA: AMMONIA: 44 umol/L (ref 11–60)

## 2014-04-10 LAB — TROPONIN I: Troponin I: <0.3 ng/mL (ref <0.30)

## 2014-04-10 LAB — LIPASE, BLOOD: LIPASE: 9 U/L — AB (ref 11–59)

## 2014-04-10 LAB — MRSA PCR SCREENING: MRSA by PCR: NEGATIVE

## 2014-04-10 MED ORDER — FLUTICASONE PROPIONATE HFA 220 MCG/ACT IN AERO
1.0000 | INHALATION_SPRAY | Freq: Two times a day (BID) | RESPIRATORY_TRACT | Status: DC
  Administered 2014-04-10 – 2014-04-11 (×2): 1 via RESPIRATORY_TRACT
  Filled 2014-04-10: qty 12

## 2014-04-10 MED ORDER — SODIUM CHLORIDE 0.9 % IV SOLN
INTRAVENOUS | Status: AC
  Administered 2014-04-10 – 2014-04-11 (×2): via INTRAVENOUS
  Administered 2014-04-11: 100 mL/h via INTRAVENOUS

## 2014-04-10 MED ORDER — ONDANSETRON HCL 4 MG/2ML IJ SOLN: 4.0000 mg | Freq: Four times a day (QID) | INTRAMUSCULAR | Status: DC | PRN

## 2014-04-10 MED ORDER — DEXTROSE 5 % IV SOLN
2.0000 g | Freq: Once | INTRAVENOUS | Status: AC
  Administered 2014-04-10: 2 g via INTRAVENOUS
  Filled 2014-04-10: qty 2

## 2014-04-10 MED ORDER — DEXTROSE 5 % IV SOLN
1.0000 g | Freq: Three times a day (TID) | INTRAVENOUS | Status: DC
  Administered 2014-04-11: 1 g via INTRAVENOUS
  Filled 2014-04-10 (×3): qty 1

## 2014-04-10 MED ORDER — HYDROCORTISONE ACETATE 25 MG RE SUPP
25.0000 mg | Freq: Two times a day (BID) | RECTAL | Status: DC | PRN
  Filled 2014-04-10: qty 1

## 2014-04-10 MED ORDER — ACETAMINOPHEN 325 MG PO TABS
650.0000 mg | ORAL_TABLET | Freq: Four times a day (QID) | ORAL | Status: DC | PRN
  Administered 2014-04-11 – 2014-04-15 (×4): 650 mg via ORAL
  Filled 2014-04-10 (×4): qty 2

## 2014-04-10 MED ORDER — OXYCODONE-ACETAMINOPHEN 5-325 MG PO TABS
1.0000 | ORAL_TABLET | ORAL | Status: DC | PRN
  Administered 2014-04-10: 1 via ORAL
  Filled 2014-04-10: qty 1

## 2014-04-10 MED ORDER — VANCOMYCIN HCL 500 MG IV SOLR
500.0000 mg | Freq: Two times a day (BID) | INTRAVENOUS | Status: DC
  Administered 2014-04-11 – 2014-04-12 (×4): 500 mg via INTRAVENOUS
  Filled 2014-04-10 (×4): qty 500

## 2014-04-10 MED ORDER — HYDROMORPHONE HCL 1 MG/ML IJ SOLN
1.0000 mg | Freq: Once | INTRAMUSCULAR | Status: AC
  Administered 2014-04-10: 1 mg via INTRAVENOUS
  Filled 2014-04-10: qty 1

## 2014-04-10 MED ORDER — ONDANSETRON HCL 4 MG PO TABS: 4.0000 mg | ORAL_TABLET | Freq: Four times a day (QID) | ORAL | Status: DC | PRN

## 2014-04-10 MED ORDER — LEVOFLOXACIN IN D5W 750 MG/150ML IV SOLN
750.0000 mg | Freq: Once | INTRAVENOUS | Status: AC
  Administered 2014-04-10: 750 mg via INTRAVENOUS
  Filled 2014-04-10: qty 150

## 2014-04-10 MED ORDER — VANCOMYCIN HCL IN DEXTROSE 1-5 GM/200ML-% IV SOLN
1000.0000 mg | Freq: Once | INTRAVENOUS | Status: AC
  Administered 2014-04-10: 1000 mg via INTRAVENOUS
  Filled 2014-04-10: qty 200

## 2014-04-10 MED ORDER — DEXTROSE 5 % IV SOLN: 1.0000 g | Freq: Three times a day (TID) | INTRAVENOUS | Status: DC

## 2014-04-10 MED ORDER — CHLORHEXIDINE GLUCONATE 0.12 % MT SOLN
15.0000 mL | Freq: Two times a day (BID) | OROMUCOSAL | Status: DC
  Administered 2014-04-11 – 2014-04-19 (×18): 15 mL via OROMUCOSAL
  Filled 2014-04-10 (×20): qty 15

## 2014-04-10 MED ORDER — LEVOFLOXACIN IN D5W 750 MG/150ML IV SOLN: 750.0000 mg | INTRAVENOUS | Status: DC

## 2014-04-10 MED ORDER — LEVALBUTEROL HCL 0.63 MG/3ML IN NEBU: 0.6300 mg | INHALATION_SOLUTION | Freq: Four times a day (QID) | RESPIRATORY_TRACT | Status: DC | PRN

## 2014-04-10 MED ORDER — ONDANSETRON HCL 4 MG PO TABS
8.0000 mg | ORAL_TABLET | Freq: Two times a day (BID) | ORAL | Status: DC
Start: 2014-04-10 — End: 2014-04-11
  Administered 2014-04-10 – 2014-04-11 (×2): 8 mg via ORAL
  Filled 2014-04-10 (×2): qty 2

## 2014-04-10 MED ORDER — SODIUM CHLORIDE 0.9 % IV SOLN: INTRAVENOUS | Status: DC

## 2014-04-10 MED ORDER — SODIUM CHLORIDE 0.9 % IV BOLUS (SEPSIS)
1000.0000 mL | INTRAVENOUS | Status: AC
  Administered 2014-04-10 (×2): 1000 mL via INTRAVENOUS

## 2014-04-10 MED ORDER — VITAMIN D 1000 UNITS PO TABS
2000.0000 [IU] | ORAL_TABLET | Freq: Every day | ORAL | Status: DC
  Administered 2014-04-11: 2000 [IU] via ORAL
  Filled 2014-04-10: qty 2

## 2014-04-10 MED ORDER — BUTALBITAL-APAP-CAFFEINE 50-325-40 MG PO TABS: 1.0000 | ORAL_TABLET | Freq: Four times a day (QID) | ORAL | Status: DC | PRN

## 2014-04-10 MED ORDER — IOHEXOL 300 MG/ML  SOLN
100.0000 mL | Freq: Once | INTRAMUSCULAR | Status: AC | PRN
  Administered 2014-04-10: 100 mL via INTRAVENOUS

## 2014-04-10 MED ORDER — ENOXAPARIN SODIUM 40 MG/0.4ML ~~LOC~~ SOLN: 40.0000 mg | SUBCUTANEOUS | Status: DC

## 2014-04-10 MED ORDER — PROCHLORPERAZINE MALEATE 10 MG PO TABS
10.0000 mg | ORAL_TABLET | Freq: Four times a day (QID) | ORAL | Status: DC | PRN
  Filled 2014-04-10: qty 1

## 2014-04-10 MED ORDER — IOHEXOL 300 MG/ML  SOLN
50.0000 mL | Freq: Once | INTRAMUSCULAR | Status: AC | PRN
  Administered 2014-04-10: 50 mL via ORAL

## 2014-04-10 MED ORDER — ACETAMINOPHEN 650 MG RE SUPP
650.0000 mg | Freq: Four times a day (QID) | RECTAL | Status: DC | PRN
  Administered 2014-04-14: 650 mg via RECTAL
  Filled 2014-04-10: qty 1

## 2014-04-10 MED ORDER — MORPHINE SULFATE 2 MG/ML IJ SOLN: 1.0000 mg | INTRAMUSCULAR | Status: DC | PRN

## 2014-04-10 MED ORDER — CETYLPYRIDINIUM CHLORIDE 0.05 % MT LIQD
7.0000 mL | Freq: Two times a day (BID) | OROMUCOSAL | Status: DC
  Administered 2014-04-11 – 2014-04-18 (×13): 7 mL via OROMUCOSAL

## 2014-04-10 MED ORDER — LORAZEPAM 0.5 MG PO TABS
0.5000 mg | ORAL_TABLET | Freq: Three times a day (TID) | ORAL | Status: DC
  Administered 2014-04-10 – 2014-04-11 (×2): 0.5 mg via ORAL
  Filled 2014-04-10 (×2): qty 1

## 2014-04-10 MED ORDER — ONDANSETRON HCL 4 MG/2ML IJ SOLN
4.0000 mg | Freq: Once | INTRAMUSCULAR | Status: AC
  Administered 2014-04-10: 4 mg via INTRAVENOUS
  Filled 2014-04-10: qty 2

## 2014-04-10 MED ORDER — PANTOPRAZOLE SODIUM 40 MG PO TBEC
40.0000 mg | DELAYED_RELEASE_TABLET | Freq: Every day | ORAL | Status: DC
  Administered 2014-04-11: 40 mg via ORAL
  Filled 2014-04-10: qty 1

## 2014-04-10 MED ORDER — CYCLOBENZAPRINE HCL 5 MG PO TABS: 5.0000 mg | ORAL_TABLET | Freq: Three times a day (TID) | ORAL | Status: DC | PRN

## 2014-04-10 MED ORDER — ACETAMINOPHEN 325 MG PO TABS
650.0000 mg | ORAL_TABLET | Freq: Once | ORAL | Status: AC
Start: 2014-04-10 — End: 2014-04-10
  Administered 2014-04-10: 650 mg via ORAL
  Filled 2014-04-10: qty 2

## 2014-04-10 NOTE — ED Provider Notes (Signed)
CSN: [id number]     Arrival date & time 04/10/14  1509 History   First MD Initiated Contact with Patient 04/10/14 1514     Chief Complaint  Patient presents with  . Code Sepsis     (Consider location/radiation/quality/duration/timing/severity/associated sxs/prior Treatment) HPI Comments: Patient presents to the ER for evaluation of generalized weakness and confusion. Patient is accompanied by her daughter. Patient is currently being treated for stage IV breast cancer that is metastatic to bone, lung and brain. Patient presented to the [facility] for her normal blood work and infusion, was found to be quite ill and sent to the ER. Daughter reports that the patient has been in bed all weekend. She has been complaining of pain across her right side. She has been confused and "delirious". She has been talking to family members aren't present. Daughter reports that she is normally oriented 3 and able to get up and perform all of her activities of daily living. Patient normally lives alone without difficulty.  Patient is confused, not answering questions appropriately. All information provided by the patient's daughter. Level V Caveat due to confusion.    Past Medical History  Diagnosis Date  . Acid reflux   . Asthma   . COPD (chronic obstructive pulmonary disease)   . Cancer     lumpectomy, radiation 2009  . Seasonal allergies   . Alcohol abuse   . Elevated d-dimer     negative chest CT  . Allergy   . Emphysema of lung   . Hot flashes   . Reading disorder   . Impaired writing skills   . Wears glasses   . Wears dentures     top  . HOH (hard of hearing)     left ear  . S/P radiation therapy 01/13/14    SRS left parietal 20Gy   Past Surgical History  Procedure Laterality Date  . Breast lumpectomy    . Abdominal hysterectomy    . Portacath placement Left 12/30/2013    Procedure: INSERTION PORT-A-CATH;  Surgeon: Merrie Roof, MD;  Location: [facility];   Service: General;  Laterality: Left;  subclavian area   Family History  Problem Relation Age of Onset  . Hypertension Other    History  Substance Use Topics  . Smoking status: Current Every Day Smoker -- 1.00 packs/day for 30 years    Types: Cigarettes  . Smokeless tobacco: Not on file     Comment: down to 1-2 cigarettes daily6  . Alcohol Use: Yes     Comment: occ beer   OB History   Grav Para Term Preterm Abortions TAB SAB Ect Mult Living   1 1 1       1      Review of Systems  Unable to perform ROS: Mental status change  Constitutional: Positive for fatigue.  Respiratory: Positive for shortness of breath.   Neurological: Positive for weakness.  Psychiatric/Behavioral: Positive for confusion.      Allergies  Penicillins  Home Medications   Prior to Admission medications   Medication Sig Start Date End Date Taking? Authorizing Provider  albuterol (PROVENTIL HFA;VENTOLIN HFA) 108 (90 BASE) MCG/ACT inhaler Inhale 2 puffs into the lungs every 6 (six) hours as needed for wheezing or shortness of breath. For shortness of breath 02/27/14  Yes Lorayne Marek, MD  butalbital-acetaminophen-caffeine (FIORICET) 50-325-40 MG per tablet Take 1-2 tablets by mouth every 6 (six) hours as needed for headache. 01/29/14 01/29/15 Yes Boyds, DO  carbamide peroxide (DEBROX) 6.5 % otic solution Place 5 drops into both ears 2 (two) times daily as needed.   Yes Historical Provider, MD  cholecalciferol (VITAMIN D) 1000 UNITS tablet Take 2,000 Units by mouth daily.   Yes Historical Provider, MD  cyclobenzaprine (FLEXERIL) 5 MG tablet Take 1 tablet (5 mg total) by mouth 3 (three) times daily as needed for muscle spasms. 03/27/14  Yes Marcelino Duster, NP  Diphenhyd-Hydrocort-Nystatin (FIRST-DUKES MOUTHWASH) SUSP Use as directed 5 mLs in the mouth or throat 4 (four) times daily. Swish, gargle and spit one to two teaspoonfuls every six hours as needed. May be swallowed if esophageal involvement.  02/06/14  Yes Marcelino Duster, NP  fluticasone (FLOVENT HFA) 220 MCG/ACT inhaler Inhale 1 puff into the lungs 2 (two) times daily. 02/27/14  Yes Lorayne Marek, MD  hydrocortisone (ANUSOL-HC) 25 MG suppository Place 25 mg rectally 2 (two) times daily as needed. 01/23/14  Yes Marcelino Duster, NP  lidocaine-prilocaine (EMLA) cream Apply 1 application topically as needed. Apply over port area 1-2 hours before chemo, cover with plastic wrap 01/14/14  Yes Chauncey Cruel, MD  LORazepam (ATIVAN) 0.5 MG tablet Take 1 tablet (0.5 mg total) by mouth every 8 (eight) hours. 03/27/14  Yes Marcelino Duster, NP  ondansetron (ZOFRAN) 8 MG tablet Take 1 tablet (8 mg total) by mouth 2 (two) times daily. Start the day after chemo for 2 days. Then take as needed for nausea or vomiting. 01/16/14  Yes Chauncey Cruel, MD  oxyCODONE-acetaminophen (ROXICET) 5-325 MG per tablet Take 1-2 tablets by mouth every 4 (four) hours as needed. 03/06/14  Yes Marcelino Duster, NP  pantoprazole (PROTONIX) 40 MG tablet Take 1 tablet (40 mg total) by mouth daily. 01/23/14  Yes Marcelino Duster, NP  prochlorperazine (COMPAZINE) 10 MG tablet Take 1 tablet (10 mg total) by mouth every 6 (six) hours as needed (Nausea or vomiting). 01/16/14  Yes Chauncey Cruel, MD  amLODipine (NORVASC) 2.5 MG tablet Take 1 tablet (2.5 mg total) by mouth daily. 02/27/14   Lorayne Marek, MD   BP 116/61  Pulse 109  Temp(Src) 100.9 F (38.3 C) (Core (Comment))  Resp 33  Ht 4\' 11"  (1.499 m)  Wt 116 lb (52.617 kg)  BMI 23.42 kg/m2  SpO2 99% Physical Exam  Constitutional: She appears well-developed and well-nourished. No distress.  HENT:  Head: Normocephalic and atraumatic.  Right Ear: Hearing normal.  Left Ear: Hearing normal.  Nose: Nose normal.  Mouth/Throat: Oropharynx is clear and moist. Mucous membranes are dry.  Eyes: Conjunctivae and EOM are normal. Pupils are equal, round, and reactive to light.  Neck: Normal range of motion. Neck supple.    Cardiovascular: Regular rhythm, S1 normal and S2 normal.  Tachycardia present.  Exam reveals no gallop and no friction rub.   No murmur heard. Pulmonary/Chest: Tachypnea noted. No respiratory distress. She has rales. She exhibits tenderness. She exhibits no crepitus.    Abdominal: Soft. Normal appearance and bowel sounds are normal. There is no hepatosplenomegaly. There is tenderness in the right upper quadrant and epigastric area. There is no rebound, no guarding, no tenderness at McBurney's point and negative Murphy's sign. No hernia.  Musculoskeletal: Normal range of motion.  Neurological: She is alert. She has normal strength. She is disoriented. No cranial nerve deficit or sensory deficit. Coordination normal. GCS eye subscore is 4. GCS verbal subscore is 4. GCS motor subscore is 6.  Skin: Skin is warm, dry and intact.  No rash noted. No cyanosis.  Psychiatric: She has a normal mood and affect. Her speech is normal and behavior is normal. Cognition and memory are impaired. She exhibits abnormal recent memory.    ED Course  Procedures (including critical care time) Labs Review Labs Reviewed  CBC WITH DIFFERENTIAL - Abnormal; Notable for the following:    WBC 16.4 (*)    RBC 3.48 (*)    Hemoglobin 10.7 (*)    HCT 32.0 (*)    RDW 19.4 (*)    Neutrophils Relative % 81 (*)    Lymphocytes Relative 9 (*)    Neutro Abs 13.3 (*)    Monocytes Absolute 1.6 (*)    All other components within normal limits  COMPREHENSIVE METABOLIC PANEL - Abnormal; Notable for the following:    Sodium 132 (*)    Chloride 92 (*)    CO2 17 (*)    Calcium 12.2 (*)    Albumin 3.4 (*)    AST 87 (*)    GFR calc non Af Amer 54 (*)    GFR calc Af Amer 62 (*)    Anion gap 23 (*)    All other components within normal limits  URINALYSIS, ROUTINE W REFLEX MICROSCOPIC - Abnormal; Notable for the following:    APPearance CLOUDY (*)    Hgb urine dipstick TRACE (*)    Protein, ur 30 (*)    All other components  within normal limits  PRO B NATRIURETIC PEPTIDE - Abnormal; Notable for the following:    Pro B Natriuretic peptide (BNP) 298.3 (*)    All other components within normal limits  BLOOD GAS, VENOUS - Abnormal; Notable for the following:    pH, Ven 7.459 (*)    pCO2, Ven 25.1 (*)    pO2, Ven 62.8 (*)    Bicarbonate 17.5 (*)    Acid-base deficit 4.7 (*)    All other components within normal limits  LIPASE, BLOOD - Abnormal; Notable for the following:    Lipase 9 (*)    All other components within normal limits  URINE MICROSCOPIC-ADD ON - Abnormal; Notable for the following:    Bacteria, UA FEW (*)    Casts HYALINE CASTS (*)    Crystals URIC ACID CRYSTALS (*)    All other components within normal limits  CBC - Abnormal; Notable for the following:    WBC 14.6 (*)    RBC 2.96 (*)    Hemoglobin 9.0 (*)    HCT 28.0 (*)    RDW 19.3 (*)    All other components within normal limits  CREATININE, SERUM - Abnormal; Notable for the following:    GFR calc non Af Amer 58 (*)    GFR calc Af Amer 67 (*)    All other components within normal limits  MRSA PCR SCREENING  CULTURE, BLOOD (ROUTINE X 2)  CULTURE, BLOOD (ROUTINE X 2)  URINE CULTURE  TROPONIN I  AMMONIA  TROPONIN I  COMPREHENSIVE METABOLIC PANEL  CBC WITH DIFFERENTIAL  TSH  T4, FREE  T3, FREE  CBC  I-STAT CG4 LACTIC ACID, ED  I-STAT CG4 LACTIC ACID, ED    Imaging Review Ct Head Wo Contrast  04/10/2014   CLINICAL DATA:  Stage IV breast cancer.  Confusion  EXAM: CT HEAD WITHOUT CONTRAST  TECHNIQUE: Contiguous axial images were obtained from the base of the skull through the vertex without intravenous contrast.  COMPARISON:  MRI 03/31/2012  FINDINGS: Left frontal parietal cortical enhancing lesion on MRI  is noted. There is adjacent vasogenic edema on the CT which is unchanged from the MRI. No associated hemorrhage.  No other areas of mass or edema identified. No acute ischemic infarct. Ventricle size normal. Negative for hemorrhage   Mucoperiosteal thickening in the left sphenoid sinus compatible with chronic sinusitis.  IMPRESSION: Mild edema in the left frontal parietal white matter consistent with known metastatic deposit. No hemorrhage or other acute abnormality identified.   Electronically Signed   By: Franchot Gallo M.D.   On: 04/10/2014 16:50   Ct Abdomen Pelvis W Contrast  04/10/2014   CLINICAL DATA:  58 year old female with generalized abdominal pain and weakness. Stage IV breast cancer.  EXAM: CT ABDOMEN AND PELVIS WITH CONTRAST  TECHNIQUE: Multidetector CT imaging of the abdomen and pelvis was performed using the standard protocol following bolus administration of intravenous contrast.  CONTRAST:  83mL OMNIPAQUE IOHEXOL 300 MG/ML SOLN, 113mL OMNIPAQUE IOHEXOL 300 MG/ML SOLN  COMPARISON:  CT of the chest, abdomen and pelvis 03/24/2014.  FINDINGS: Lower chest: Dependent atelectasis in lung bases bilaterally. Paraesophageal lymphadenopathy again noted measuring up to 1 cm in short axis.  Hepatobiliary: No focal cystic or solid hepatic lesions. Trace intrahepatic biliary ductal dilatation (unchanged). Common bile duct remains dilated in the porta hepatis measuring 15 mm on today's examination (decreased compared to prior). Status post cholecystectomy.  Pancreas: Atrophic, but otherwise unremarkable.  Spleen: Unremarkable.  Adrenals/Urinary Tract: Bilateral adrenal glands and bilateral kidneys are normal in appearance. No hydronephrosis to suggest urinary tract obstruction at this time. Foley balloon catheter in the lumen of the urinary bladder, which is completely decompressed.  Stomach/Bowel: The stomach is normal in appearance. No pathologic dilatation of small bowel or colon. Numerous colonic diverticulae are noted, without surrounding inflammatory changes to suggest an acute diverticulitis at this time.  Vascular/Lymphatic: Extensive atherosclerosis throughout the abdominal and pelvic vasculature, without evidence of aneurysm or  dissection. Retroaortic left renal vein. Numerous reactive size lymph nodes are noted throughout the retroperitoneum (nonspecific). No pathologically enlarged lymph nodes are noted in the abdomen or pelvis.  Reproductive: Status post hysterectomy. Ovaries are not confidently identified may be surgically absent or atrophic.  Other: No significant volume of ascites.  No pneumoperitoneum.  Musculoskeletal: Bones are diffusely heterogeneous in appearance with innumerable small lytic lesions, compatible with widespread metastatic disease throughout the skeleton.  IMPRESSION: 1. No acute findings in the abdomen or pelvis. 2. Persistent very mild intrahepatic biliary ductal dilatation, and decreasing extrahepatic biliary ductal dilatation. This is favored to represent "functional" dilatation in this post cholecystectomy its patient, however, correlation with liver function tests is recommended. The possibility of a distal ductal stricture or tiny ampullary lesion is difficult to entirely exclude, particularly if there are elevated liver function tests. 3. Colonic diverticulosis without findings to suggest acute diverticulitis at this time. 4. Interval placement of Foley catheter in the urinary bladder which appears properly located. 5. Additional incidental findings, as above, similar to the recent prior examination.   Electronically Signed   By: Vinnie Langton M.D.   On: 04/10/2014 20:04   Dg Chest Port 1 View  04/10/2014   CLINICAL DATA:  Generalized weakness, shortness of breath, fever, and confusion, history stage IV breast cancer  EXAM: PORTABLE CHEST - 1 VIEW  COMPARISON:  Portable exam 1533 hr compared to 04/03/2014  FINDINGS: RIGHT jugular Port-A-Cath with tip projecting over SVC near cavoatrial junction.  Borderline enlargement of cardiac silhouette.  Atherosclerotic calcification aorta.  Mediastinal contours and pulmonary vascularity normal.  Accentuated interstitial markings in part at least due to  differences in technique.  Bibasilar atelectasis versus infiltrates, increased.  Upper lungs clear.  Questionable tiny LEFT pleural effusion.  No pneumothorax.  Bones demineralized.  IMPRESSION: Bibasilar atelectasis versus infiltrates.   Electronically Signed   By: Lavonia Dana M.D.   On: 04/10/2014 16:00     EKG Interpretation   Date/Time:  Monday April 10 2014 15:11:04 EDT Ventricular Rate:  130 PR Interval:  127 QRS Duration: 77 QT Interval:  318 QTC Calculation: 468 R Axis:   73 Text Interpretation:  Sinus tachycardia Otherwise within normal limits  Confirmed by POLLINA  MD, CHRISTOPHER [phone]) on 04/10/2014 4:52:27 PM      MDM   Final diagnoses:  Confusion  Abdominal pain, acute  Sepsis  Patient presented to the ER for evaluation of acute confusion. Patient has been sick for the last 2 or 3 days. She has been unable to get out of her bed secondary to generalized weakness. Patient was found to be acutely febrile, rectal temp of 103. The patient was tachycardic but not hypotensive. She did have mild hypoxia present on arrival. She was initiated and sepsis pathway for unclear source. Workup has identified an acute source for the patient's sepsis. She did, however, significantly improve after she defervesced and had IV fluids. Patient is now more awake and alert, much more like herself. There is no further confusion. Therefore, it was not felt that the patient would require lumbar puncture for further evaluation. Patient will, however, require hospitalization for further management of acute sepsis of unclear etiology.  CRITICAL CARE Performed by: Orpah Greek   Total critical care time: 51min  Critical care time was exclusive of separately billable procedures and treating other patients.  Critical care was necessary to treat or prevent imminent or life-threatening deterioration.  Critical care was time spent personally by me on the following activities: development  of treatment plan with patient and/or surrogate as well as nursing, discussions with consultants, evaluation of patient's response to treatment, examination of patient, obtaining history from patient or surrogate, ordering and performing treatments and interventions, ordering and review of laboratory studies, ordering and review of radiographic studies, pulse oximetry and re-evaluation of patient's condition.     Orpah Greek, MD 04/11/14 [phone]

## 2014-04-10 NOTE — ED Notes (Signed)
Bed: WA12 Expected date:  Expected time:  Means of arrival:  Comments: Pt from [facility] 

## 2014-04-10 NOTE — H&P (Signed)
[facility] History and Physical  [name] MIW:[id number] DOB: 21-Oct-1955 DOA: 04/10/2014  Referring physician: ER physician. PCP: Lorayne Marek, MD   Chief Complaint: Weakness and confusion.  HPI: [name] is a 59 y.o. female history of metastatic breast cancer, COPD and hypertension was referred to the ER from the [facility] after patient was found to be weak and confused. Patient had gone to her regular chemotherapy today and was found to be confused and weak and was referred to the ER and in the ER patient was found to be febrile. Patient was complaining of right lower chest pain. Patient states over the last 2-3 days she has been finding it in increasing it difficult to walk due to weakness and also has been having productive cough. In the ER chest x-ray shows bilateral atelectasis versus infiltrates and CT abdomen and pelvis was done which was unremarkable. Patient has been started on empiric antibiotics for possible sepsis.   Review of Systems: As presented in the history of presenting illness, rest negative.  Past Medical History  Diagnosis Date  . Acid reflux   . Asthma   . COPD (chronic obstructive pulmonary disease)   . Cancer     lumpectomy, radiation 2009  . Seasonal allergies   . Alcohol abuse   . Elevated d-dimer     negative chest CT  . Allergy   . Emphysema of lung   . Hot flashes   . Reading disorder   . Impaired writing skills   . Wears glasses   . Wears dentures     top  . HOH (hard of hearing)     left ear  . S/P radiation therapy 01/13/14    SRS left parietal 20Gy   Past Surgical History  Procedure Laterality Date  . Breast lumpectomy    . Abdominal hysterectomy    . Portacath placement Left 12/30/2013    Procedure: INSERTION PORT-A-CATH;  Surgeon: Merrie Roof, MD;  Location: [facility];  Service: General;  Laterality: Left;  subclavian area   Social History:  reports that she has been smoking Cigarettes.  She  has a 30 pack-year smoking history. She does not have any smokeless tobacco history on file. She reports that she drinks alcohol. She reports that she does not use illicit drugs. Where does patient live home. Can patient participate in ADLs? Unsure.  Allergies  Allergen Reactions  . Penicillins Other (See Comments)    Reaction a long time ago    Family History:  Family History  Problem Relation Age of Onset  . Hypertension Other       Prior to Admission medications   Medication Sig Start Date End Date Taking? Authorizing Provider  albuterol (PROVENTIL HFA;VENTOLIN HFA) 108 (90 BASE) MCG/ACT inhaler Inhale 2 puffs into the lungs every 6 (six) hours as needed for wheezing or shortness of breath. For shortness of breath 02/27/14  Yes Lorayne Marek, MD  butalbital-acetaminophen-caffeine (FIORICET) 50-325-40 MG per tablet Take 1-2 tablets by mouth every 6 (six) hours as needed for headache. 01/29/14 01/29/15 Yes Kristen N Ward, DO  carbamide peroxide (DEBROX) 6.5 % otic solution Place 5 drops into both ears 2 (two) times daily as needed.   Yes Historical Provider, MD  cholecalciferol (VITAMIN D) 1000 UNITS tablet Take 2,000 Units by mouth daily.   Yes Historical Provider, MD  cyclobenzaprine (FLEXERIL) 5 MG tablet Take 1 tablet (5 mg total) by mouth 3 (three) times daily as needed for muscle spasms.  03/27/14  Yes Marcelino Duster, NP  Diphenhyd-Hydrocort-Nystatin (FIRST-DUKES MOUTHWASH) SUSP Use as directed 5 mLs in the mouth or throat 4 (four) times daily. Swish, gargle and spit one to two teaspoonfuls every six hours as needed. May be swallowed if esophageal involvement. 02/06/14  Yes Marcelino Duster, NP  fluticasone (FLOVENT HFA) 220 MCG/ACT inhaler Inhale 1 puff into the lungs 2 (two) times daily. 02/27/14  Yes Lorayne Marek, MD  hydrocortisone (ANUSOL-HC) 25 MG suppository Place 25 mg rectally 2 (two) times daily as needed. 01/23/14  Yes Marcelino Duster, NP  lidocaine-prilocaine (EMLA) cream  Apply 1 application topically as needed. Apply over port area 1-2 hours before chemo, cover with plastic wrap 01/14/14  Yes Chauncey Cruel, MD  LORazepam (ATIVAN) 0.5 MG tablet Take 1 tablet (0.5 mg total) by mouth every 8 (eight) hours. 03/27/14  Yes Marcelino Duster, NP  ondansetron (ZOFRAN) 8 MG tablet Take 1 tablet (8 mg total) by mouth 2 (two) times daily. Start the day after chemo for 2 days. Then take as needed for nausea or vomiting. 01/16/14  Yes Chauncey Cruel, MD  oxyCODONE-acetaminophen (ROXICET) 5-325 MG per tablet Take 1-2 tablets by mouth every 4 (four) hours as needed. 03/06/14  Yes Marcelino Duster, NP  pantoprazole (PROTONIX) 40 MG tablet Take 1 tablet (40 mg total) by mouth daily. 01/23/14  Yes Marcelino Duster, NP  prochlorperazine (COMPAZINE) 10 MG tablet Take 1 tablet (10 mg total) by mouth every 6 (six) hours as needed (Nausea or vomiting). 01/16/14  Yes Chauncey Cruel, MD  amLODipine (NORVASC) 2.5 MG tablet Take 1 tablet (2.5 mg total) by mouth daily. 02/27/14   Lorayne Marek, MD    Physical Exam: Filed Vitals:   04/10/14 1745 04/10/14 1800 04/10/14 1801 04/10/14 1951  BP:  110/68  117/64  Pulse: 117 122  120  Temp:   100.9 F (38.3 C) 100.2 F (37.9 C)  TempSrc:   Core (Comment) Core (Comment)  Resp: 28 27  18   Height:      Weight:      SpO2: 100% 95%  89%     General:  Poorly built and nourished.  Eyes: Anicteric no pallor.  ENT: No discharge from the ears eyes nose and mouth.  Neck: No mass felt and no neck rigidity.  Cardiovascular: S1-S2 heard.  Respiratory: No rhonchi or crepitations.  Abdomen: Soft nontender bowel sounds present.  Skin: No rash.  Musculoskeletal: Tenderness in the right lower chest.  Psychiatric: Appears normal.  Neurologic: Alert awake oriented to time place and person. Moves all extremities.  Labs on Admission:  Basic Metabolic Panel:  Recent Labs Lab 04/03/14 2151 04/10/14 1428 04/10/14 1537  NA 135* 135*  132*  K 4.1 4.5 4.0  CL 93*  --  92*  CO2 26 18* 17*  GLUCOSE 87 101 94  BUN 11 22.6 23  CREATININE 0.74 1.2* 1.10  CALCIUM 10.4 12.8* 12.2*   Liver Function Tests:  Recent Labs Lab 04/03/14 2151 04/10/14 1428 04/10/14 1537  AST 30 90* 87*  ALT 9 19 15   ALKPHOS 87 107 108  BILITOT 0.3 0.49 0.4  PROT 7.0 7.9 7.9  ALBUMIN 3.4* 3.2* 3.4*    Recent Labs Lab 04/03/14 2151 04/10/14 1537  LIPASE 12 9*    Recent Labs Lab 04/10/14 1537  AMMONIA 44   CBC:  Recent Labs Lab 04/03/14 2151 04/10/14 1427 04/10/14 1537  WBC 12.0* 16.7* 16.4*  NEUTROABS 9.1* 13.6* 13.3*  HGB 10.3* 11.2* 10.7*  HCT 31.8* 33.7* 32.0*  MCV 96.1 92.3 92.0  PLT 241 222 227   Cardiac Enzymes:  Recent Labs Lab 04/10/14 1537  TROPONINI <0.30    BNP (last 3 results)  Recent Labs  04/10/14 1537  PROBNP 298.3*   CBG: No results found for this basename: GLUCAP,  in the last 168 hours  Radiological Exams on Admission: Ct Head Wo Contrast  04/10/2014   CLINICAL DATA:  Stage IV breast cancer.  Confusion  EXAM: CT HEAD WITHOUT CONTRAST  TECHNIQUE: Contiguous axial images were obtained from the base of the skull through the vertex without intravenous contrast.  COMPARISON:  MRI 03/31/2012  FINDINGS: Left frontal parietal cortical enhancing lesion on MRI is noted. There is adjacent vasogenic edema on the CT which is unchanged from the MRI. No associated hemorrhage.  No other areas of mass or edema identified. No acute ischemic infarct. Ventricle size normal. Negative for hemorrhage  Mucoperiosteal thickening in the left sphenoid sinus compatible with chronic sinusitis.  IMPRESSION: Mild edema in the left frontal parietal white matter consistent with known metastatic deposit. No hemorrhage or other acute abnormality identified.   Electronically Signed   By: Franchot Gallo M.D.   On: 04/10/2014 16:50   Ct Abdomen Pelvis W Contrast  04/10/2014   CLINICAL DATA:  58 year old female with generalized  abdominal pain and weakness. Stage IV breast cancer.  EXAM: CT ABDOMEN AND PELVIS WITH CONTRAST  TECHNIQUE: Multidetector CT imaging of the abdomen and pelvis was performed using the standard protocol following bolus administration of intravenous contrast.  CONTRAST:  30mL OMNIPAQUE IOHEXOL 300 MG/ML SOLN, 184mL OMNIPAQUE IOHEXOL 300 MG/ML SOLN  COMPARISON:  CT of the chest, abdomen and pelvis 03/24/2014.  FINDINGS: Lower chest: Dependent atelectasis in lung bases bilaterally. Paraesophageal lymphadenopathy again noted measuring up to 1 cm in short axis.  Hepatobiliary: No focal cystic or solid hepatic lesions. Trace intrahepatic biliary ductal dilatation (unchanged). Common bile duct remains dilated in the porta hepatis measuring 15 mm on today's examination (decreased compared to prior). Status post cholecystectomy.  Pancreas: Atrophic, but otherwise unremarkable.  Spleen: Unremarkable.  Adrenals/Urinary Tract: Bilateral adrenal glands and bilateral kidneys are normal in appearance. No hydronephrosis to suggest urinary tract obstruction at this time. Foley balloon catheter in the lumen of the urinary bladder, which is completely decompressed.  Stomach/Bowel: The stomach is normal in appearance. No pathologic dilatation of small bowel or colon. Numerous colonic diverticulae are noted, without surrounding inflammatory changes to suggest an acute diverticulitis at this time.  Vascular/Lymphatic: Extensive atherosclerosis throughout the abdominal and pelvic vasculature, without evidence of aneurysm or dissection. Retroaortic left renal vein. Numerous reactive size lymph nodes are noted throughout the retroperitoneum (nonspecific). No pathologically enlarged lymph nodes are noted in the abdomen or pelvis.  Reproductive: Status post hysterectomy. Ovaries are not confidently identified may be surgically absent or atrophic.  Other: No significant volume of ascites.  No pneumoperitoneum.  Musculoskeletal: Bones are  diffusely heterogeneous in appearance with innumerable small lytic lesions, compatible with widespread metastatic disease throughout the skeleton.  IMPRESSION: 1. No acute findings in the abdomen or pelvis. 2. Persistent very mild intrahepatic biliary ductal dilatation, and decreasing extrahepatic biliary ductal dilatation. This is favored to represent "functional" dilatation in this post cholecystectomy its patient, however, correlation with liver function tests is recommended. The possibility of a distal ductal stricture or tiny ampullary lesion is difficult to entirely exclude, particularly if there are elevated liver function tests. 3. Colonic diverticulosis  without findings to suggest acute diverticulitis at this time. 4. Interval placement of Foley catheter in the urinary bladder which appears properly located. 5. Additional incidental findings, as above, similar to the recent prior examination.   Electronically Signed   By: Vinnie Langton M.D.   On: 04/10/2014 20:04   Dg Chest Port 1 View  04/10/2014   CLINICAL DATA:  Generalized weakness, shortness of breath, fever, and confusion, history stage IV breast cancer  EXAM: PORTABLE CHEST - 1 VIEW  COMPARISON:  Portable exam 1533 hr compared to 04/03/2014  FINDINGS: RIGHT jugular Port-A-Cath with tip projecting over SVC near cavoatrial junction.  Borderline enlargement of cardiac silhouette.  Atherosclerotic calcification aorta.  Mediastinal contours and pulmonary vascularity normal.  Accentuated interstitial markings in part at least due to differences in technique.  Bibasilar atelectasis versus infiltrates, increased.  Upper lungs clear.  Questionable tiny LEFT pleural effusion.  No pneumothorax.  Bones demineralized.  IMPRESSION: Bibasilar atelectasis versus infiltrates.   Electronically Signed   By: Lavonia Dana M.D.   On: 04/10/2014 16:00     Assessment/Plan Principal Problem:   Sepsis Active Problems:   COPD (chronic obstructive pulmonary  disease)   Breast cancer metastasized to bone   Hypertension   1. Sepsis - source not clear probably pneumonia. Patient has been placed on empiric antibiotics vancomycin and aztreonam and Levaquin. Follow blood cultures. Check CT angiogram of the chest in a.m. as patient has had received contrast already to rule out any pulmonary embolism since patient is having significant pain in the right lower chest. 2. Acute encephalopathy - probably from sepsis essentially resolved at this time. 3. Weakness - probably from dehydration. If CT chest is unremarkable patient may need MRI of the spine. 4. Metastatic breast cancer - as per oncologist. 5. Hypercalcemia - patient probably will need zoledronic but will need to discuss with oncologist before giving as patient was being assessed by oncologist to be referred to dental surgeon. Follow metabolic panel closely. 6. Hypertension - continue present medications. 7. COPD - presently not wheezing. 8. Chronic anemia - follow CBC.    Code Status: Full code.  Family Communication: Patient's daughter at the bedside.  Disposition Plan: Admit to inpatient.    KAKRAKANDY,ARSHAD N. [facility] Pager [phone].  If 7PM-7AM, please contact night-coverage [email or web address] Password [email or web address] 04/10/2014, 9:44 PM

## 2014-04-10 NOTE — Progress Notes (Signed)
  CARE MANAGEMENT ED NOTE 04/10/2014  Patient:  MAKINZIE, CONSIDINE   Account Number:  [id number]  Date Initiated:  04/10/2014  Documentation initiated by:  Livia Snellen  Subjective/Objective Assessment:   Patient presents to Ed with generalized weakness and confusion     Subjective/Objective Assessment Detail:   Patient with pmhx of stage Iv breast cancer, COPD, asthma, HOH     Action/Plan:   Action/Plan Detail:   Anticipated DC Date:       Status Recommendation to Physician:   Result of Recommendation:    Other ED Services  Consult Working [place]  Other    Choice offered to / List presented to:            Status of service:  Completed, signed off  ED Comments:   ED Comments Detail:  EDCM spoke to patient at her daughter Linus Orn at bedisde. Patient's daughter answered questions.  Patient lives by herself.  Patient's daughter reports up until now the patient has been able to perform her ADL's without difficulty at home. Patient noted to be wearing oxygen in the ED.  Patient does not wear oxygen at home.  Patient's daughter reports the patient does not have any medical equipment at home or home health services at this time. EDCM provided patient with list of home health agencies in [facility].  Explained to patient's daughter, patient may have a visiting RN if needed.  EDCM also provided patient list of private duty nursing agencies.  Patient's daughter thsnkful for resources.  No further EDCM needs at this time.

## 2014-04-10 NOTE — Progress Notes (Signed)
ANTIBIOTIC CONSULT NOTE - INITIAL  Pharmacy Consult for Vancomycin, Aztreonam, Levaquin Indication: rule out sepsis  Allergies  Allergen Reactions  . Penicillins Other (See Comments)    Reaction a long time ago    Patient Measurements: Weight: 116 lb (52.617 kg)   Vital Signs: Temp: 103 F (39.4 C) (10/26 1522) Temp Source: Rectal (10/26 1522) BP: 147/81 mmHg (10/26 1522) Pulse Rate: 129 (10/26 1522) Intake/Output from previous day:   Intake/Output from this shift:    Labs:  Recent Labs  04/10/14 1427 04/10/14 1428  WBC 16.7*  --   HGB 11.2*  --   PLT 222  --   CREATININE  --  1.2*   The CrCl is unknown because both a height and weight (above a minimum accepted value) are required for this calculation. No results found for this basename: Letta Median, VANCORANDOM, [place], GENTPEAK, [place], [place], [place], TOBRARND, AMIKACINPEAK, AMIKACINTROU, AMIKACIN,  in the last 72 hours   Microbiology: Recent Results (from the past 720 hour(s))  TECHNOLOGIST REVIEW     Status: None   Collection Time    03/20/14 11:01 AM      Result Value Ref Range Status   Technologist Review Occ Metas and Myelocytes present   Final  TECHNOLOGIST REVIEW     Status: None   Collection Time    03/27/14 10:20 AM      Result Value Ref Range Status   Technologist Review Oc meta and myelocyte   Final  URINE CULTURE     Status: None   Collection Time    04/03/14  9:44 PM      Result Value Ref Range Status   Specimen Description URINE, CLEAN CATCH   Final   Special Requests NONE   Final   Culture  Setup Time     Final   Value: 04/04/2014 01:52     Performed at [place]     Final   Value: NO GROWTH     Performed at [facility]   Culture     Final   Value: NO GROWTH     Performed at [facility]   Report Status 04/04/2014 FINAL   Final  TECHNOLOGIST REVIEW     Status: None   Collection Time    04/10/14  2:27 PM   Result Value Ref Range Status   Technologist Review 2% Nrbcs, 3% metamyelocytes   Final    Medical History: Past Medical History  Diagnosis Date  . Acid reflux   . Asthma   . COPD (chronic obstructive pulmonary disease)   . Cancer     lumpectomy, radiation 2009  . Seasonal allergies   . Alcohol abuse   . Elevated d-dimer     negative chest CT  . Allergy   . Emphysema of lung   . Hot flashes   . Reading disorder   . Impaired writing skills   . Wears glasses   . Wears dentures     top  . HOH (hard of hearing)     left ear  . S/P radiation therapy 01/13/14    SRS left parietal 20Gy    Medications:  Anti-infectives   Start     Dose/Rate Route Frequency Ordered Stop   04/10/14 1530  levofloxacin (LEVAQUIN) IVPB 750 mg     750 mg 100 mL/hr over 90 Minutes Intravenous  Once 04/10/14 1521     04/10/14 1530  aztreonam (AZACTAM) 2 g in dextrose  5 % 50 mL IVPB     2 g 100 mL/hr over 30 Minutes Intravenous  Once 04/10/14 1521     04/10/14 1530  vancomycin (VANCOCIN) IVPB 1000 mg/200 mL premix     1,000 mg 200 mL/hr over 60 Minutes Intravenous  Once 04/10/14 1521       Assessment: 58yo F w/ stage IV breast cancer presents from [facility] with fever, generalized weakness, and confusion. Pharmacy is asked to start broad-spectrum abx for sepsis. First doses given from the ED pyxis.  10/26 >> Vanc >> 10/26 >> Aztreonam >>   10/26 >> Levaquin >>  Tmax: 103 WBCs: elevated Renal: SCr 1.2, CrCl 38CG, 57N  10/26 blood x2: sent 10/26 urine: sent   Goal of Therapy:  Vancomycin trough level 15-20 mcg/ml Appropriate antibiotic dosing for renal function; eradication of infection  Plan:   Vancomycin 1g in ED then 500mg  IV q12h  Aztreonam 2g in ED then 1g IV q8h  Levaquin 750mg  IV q24h  Measure Vanc trough at steady state  Follow up renal fxn and culture results  Romeo Rabon, PharmD, pager [phone]. 04/10/2014,3:33 PM.

## 2014-04-10 NOTE — ED Notes (Signed)
Patient transported to CT 

## 2014-04-10 NOTE — ED Notes (Signed)
Fluids paused, will draw blood sample from port-a-cath at 1830.

## 2014-04-11 ENCOUNTER — Ambulatory Visit (HOSPITAL_COMMUNITY): Payer: Medicaid Other

## 2014-04-11 ENCOUNTER — Encounter (HOSPITAL_COMMUNITY): Payer: Self-pay | Admitting: Radiology

## 2014-04-11 ENCOUNTER — Other Ambulatory Visit: Payer: Self-pay | Admitting: Nurse Practitioner

## 2014-04-11 DIAGNOSIS — J9620 Acute and chronic respiratory failure, unspecified whether with hypoxia or hypercapnia: Secondary | ICD-10-CM

## 2014-04-11 DIAGNOSIS — J438 Other emphysema: Secondary | ICD-10-CM

## 2014-04-11 DIAGNOSIS — J42 Unspecified chronic bronchitis: Secondary | ICD-10-CM

## 2014-04-11 DIAGNOSIS — A419 Sepsis, unspecified organism: Secondary | ICD-10-CM

## 2014-04-11 DIAGNOSIS — C50411 Malignant neoplasm of upper-outer quadrant of right female breast: Secondary | ICD-10-CM

## 2014-04-11 DIAGNOSIS — J9621 Acute and chronic respiratory failure with hypoxia: Secondary | ICD-10-CM

## 2014-04-11 DIAGNOSIS — D72829 Elevated white blood cell count, unspecified: Secondary | ICD-10-CM

## 2014-04-11 DIAGNOSIS — R6521 Severe sepsis with septic shock: Secondary | ICD-10-CM

## 2014-04-11 DIAGNOSIS — R410 Disorientation, unspecified: Secondary | ICD-10-CM

## 2014-04-11 LAB — CBC WITH DIFFERENTIAL/PLATELET
BASOS PCT: 0 % (ref 0–1)
BASOS PCT: 0 % (ref 0–1)
Basophils Absolute: 0 10*3/uL (ref 0.0–0.1)
Basophils Absolute: 0 10*3/uL (ref 0.0–0.1)
EOS ABS: 0 10*3/uL (ref 0.0–0.7)
EOS PCT: 0 % (ref 0–5)
EOS PCT: 0 % (ref 0–5)
Eosinophils Absolute: 0 10*3/uL (ref 0.0–0.7)
HCT: 25.8 % — ABNORMAL LOW (ref 36.0–46.0)
HCT: 27.1 % — ABNORMAL LOW (ref 36.0–46.0)
HEMOGLOBIN: 8.4 g/dL — AB (ref 12.0–15.0)
Hemoglobin: 8.8 g/dL — ABNORMAL LOW (ref 12.0–15.0)
LYMPHS PCT: 8 % — AB (ref 12–46)
Lymphocytes Relative: 8 % — ABNORMAL LOW (ref 12–46)
Lymphs Abs: 1 10*3/uL (ref 0.7–4.0)
Lymphs Abs: 1.1 10*3/uL (ref 0.7–4.0)
MCH: 30.7 pg (ref 26.0–34.0)
MCH: 30 pg (ref 26.0–34.0)
MCHC: 32.6 g/dL (ref 30.0–36.0)
MCHC: 32.5 g/dL (ref 30.0–36.0)
MCV: 94.2 fL (ref 78.0–100.0)
MCV: 92.5 fL (ref 78.0–100.0)
MONOS PCT: 7 % (ref 3–12)
Monocytes Absolute: 1.2 10*3/uL — ABNORMAL HIGH (ref 0.1–1.0)
Monocytes Absolute: 0.9 10*3/uL (ref 0.1–1.0)
Monocytes Relative: 9 % (ref 3–12)
NEUTROS PCT: 85 % — AB (ref 43–77)
Neutro Abs: 10.8 10*3/uL — ABNORMAL HIGH (ref 1.7–7.7)
Neutro Abs: 11.5 10*3/uL — ABNORMAL HIGH (ref 1.7–7.7)
Neutrophils Relative %: 83 % — ABNORMAL HIGH (ref 43–77)
PLATELETS: 180 10*3/uL (ref 150–400)
PLATELETS: 211 10*3/uL (ref 150–400)
RBC: 2.74 MIL/uL — ABNORMAL LOW (ref 3.87–5.11)
RBC: 2.93 MIL/uL — ABNORMAL LOW (ref 3.87–5.11)
RDW: 19.5 % — ABNORMAL HIGH (ref 11.5–15.5)
RDW: 19.5 % — ABNORMAL HIGH (ref 11.5–15.5)
WBC: 13 10*3/uL — ABNORMAL HIGH (ref 4.0–10.5)
WBC: 13.5 10*3/uL — AB (ref 4.0–10.5)

## 2014-04-11 LAB — COMPREHENSIVE METABOLIC PANEL
ALBUMIN: 2.5 g/dL — AB (ref 3.5–5.2)
ALK PHOS: 92 U/L (ref 39–117)
ALT: 11 U/L (ref 0–35)
ALT: 11 U/L (ref 0–35)
ANION GAP: 15 (ref 5–15)
AST: 57 U/L — ABNORMAL HIGH (ref 0–37)
AST: 66 U/L — AB (ref 0–37)
Albumin: 2.5 g/dL — ABNORMAL LOW (ref 3.5–5.2)
Alkaline Phosphatase: 85 U/L (ref 39–117)
Anion gap: 17 — ABNORMAL HIGH (ref 5–15)
BILIRUBIN TOTAL: 0.3 mg/dL (ref 0.3–1.2)
BUN: 16 mg/dL (ref 6–23)
BUN: 13 mg/dL (ref 6–23)
CHLORIDE: 99 meq/L (ref 96–112)
CO2: 18 mEq/L — ABNORMAL LOW (ref 19–32)
CO2: 16 mEq/L — ABNORMAL LOW (ref 19–32)
CREATININE: 0.91 mg/dL (ref 0.50–1.10)
Calcium: 9.9 mg/dL (ref 8.4–10.5)
Calcium: 10.2 mg/dL (ref 8.4–10.5)
Chloride: 100 mEq/L (ref 96–112)
Creatinine, Ser: 0.8 mg/dL (ref 0.50–1.10)
GFR calc Af Amer: 78 mL/min — ABNORMAL LOW (ref >90)
GFR calc Af Amer: >90 mL/min (ref >90)
GFR calc non Af Amer: 68 mL/min — ABNORMAL LOW (ref >90)
GFR calc non Af Amer: 79 mL/min — ABNORMAL LOW (ref >90)
Glucose, Bld: 73 mg/dL (ref 70–99)
Glucose, Bld: 90 mg/dL (ref 70–99)
POTASSIUM: 3.7 meq/L (ref 3.7–5.3)
Potassium: 3.9 mEq/L (ref 3.7–5.3)
SODIUM: 133 meq/L — AB (ref 137–147)
SODIUM: 132 meq/L — AB (ref 137–147)
TOTAL PROTEIN: 6.2 g/dL (ref 6.0–8.3)
TOTAL PROTEIN: 6.6 g/dL (ref 6.0–8.3)
Total Bilirubin: 0.2 mg/dL — ABNORMAL LOW (ref 0.3–1.2)

## 2014-04-11 LAB — BLOOD GAS, ARTERIAL
ACID-BASE DEFICIT: 7.7 mmol/L — AB (ref 0.0–2.0)
BICARBONATE: 16.1 meq/L — AB (ref 20.0–24.0)
DRAWN BY: 23588
O2 CONTENT: 6 L/min
O2 SAT: 90.7 %
PO2 ART: 76 mmHg — AB (ref 80.0–100.0)
Patient temperature: 103
TCO2: 15.2 mmol/L (ref 0–100)
pCO2 arterial: 31.5 mmHg — ABNORMAL LOW (ref 35.0–45.0)
pH, Arterial: 7.342 — ABNORMAL LOW (ref 7.350–7.450)

## 2014-04-11 LAB — URINE CULTURE
COLONY COUNT: NO GROWTH
Culture: NO GROWTH

## 2014-04-11 LAB — LEGIONELLA ANTIGEN, URINE

## 2014-04-11 LAB — PROCALCITONIN: PROCALCITONIN: 4.83 ng/mL

## 2014-04-11 LAB — T4, FREE: FREE T4: 1.08 ng/dL (ref 0.80–1.80)

## 2014-04-11 LAB — INFLUENZA PANEL BY PCR (TYPE A & B)
H1N1 flu by pcr: NOT DETECTED
Influenza A By PCR: NEGATIVE
Influenza B By PCR: NEGATIVE

## 2014-04-11 LAB — T3, FREE: T3, Free: 2.3 pg/mL (ref 2.3–4.2)

## 2014-04-11 LAB — TSH: TSH: 5.36 u[IU]/mL — ABNORMAL HIGH (ref 0.350–4.500)

## 2014-04-11 LAB — LACTIC ACID, PLASMA: LACTIC ACID, VENOUS: 1.2 mmol/L (ref 0.5–2.2)

## 2014-04-11 LAB — STREP PNEUMONIAE URINARY ANTIGEN: STREP PNEUMO URINARY ANTIGEN: NEGATIVE

## 2014-04-11 MED ORDER — DEXTROSE 5 % IV SOLN
1.0000 g | Freq: Three times a day (TID) | INTRAVENOUS | Status: DC
  Administered 2014-04-11 – 2014-04-18 (×22): 1 g via INTRAVENOUS
  Filled 2014-04-11 (×23): qty 1

## 2014-04-11 MED ORDER — PANTOPRAZOLE SODIUM 40 MG IV SOLR
40.0000 mg | Freq: Every day | INTRAVENOUS | Status: DC
  Administered 2014-04-12 – 2014-04-17 (×6): 40 mg via INTRAVENOUS
  Filled 2014-04-11 (×6): qty 40

## 2014-04-11 MED ORDER — LEVALBUTEROL HCL 0.63 MG/3ML IN NEBU
0.6300 mg | INHALATION_SOLUTION | Freq: Three times a day (TID) | RESPIRATORY_TRACT | Status: DC
  Administered 2014-04-11 (×2): 0.63 mg via RESPIRATORY_TRACT
  Filled 2014-04-11 (×2): qty 3

## 2014-04-11 MED ORDER — METHYLPREDNISOLONE SODIUM SUCC 125 MG IJ SOLR
125.0000 mg | Freq: Once | INTRAMUSCULAR | Status: AC
  Administered 2014-04-11: 125 mg via INTRAVENOUS
  Filled 2014-04-11: qty 2

## 2014-04-11 MED ORDER — IOHEXOL 350 MG/ML SOLN
100.0000 mL | Freq: Once | INTRAVENOUS | Status: AC | PRN
  Administered 2014-04-11: 100 mL via INTRAVENOUS

## 2014-04-11 MED ORDER — MORPHINE SULFATE 2 MG/ML IJ SOLN: 1.0000 mg | INTRAMUSCULAR | Status: DC | PRN

## 2014-04-11 MED ORDER — OXYCODONE-ACETAMINOPHEN 5-325 MG PO TABS
1.0000 | ORAL_TABLET | ORAL | Status: DC | PRN
  Administered 2014-04-11 (×2): 1 via ORAL
  Filled 2014-04-11: qty 2
  Filled 2014-04-11: qty 1

## 2014-04-11 MED ORDER — LEVALBUTEROL HCL 0.63 MG/3ML IN NEBU
0.6300 mg | INHALATION_SOLUTION | RESPIRATORY_TRACT | Status: DC
  Administered 2014-04-11 – 2014-04-13 (×10): 0.63 mg via RESPIRATORY_TRACT
  Filled 2014-04-11 (×10): qty 3

## 2014-04-11 MED ORDER — METRONIDAZOLE IN NACL 5-0.79 MG/ML-% IV SOLN
500.0000 mg | Freq: Three times a day (TID) | INTRAVENOUS | Status: DC
  Administered 2014-04-11 – 2014-04-14 (×9): 500 mg via INTRAVENOUS
  Filled 2014-04-11 (×9): qty 100

## 2014-04-11 MED ORDER — IPRATROPIUM BROMIDE 0.02 % IN SOLN
0.5000 mg | RESPIRATORY_TRACT | Status: DC
  Administered 2014-04-11 – 2014-04-13 (×10): 0.5 mg via RESPIRATORY_TRACT
  Filled 2014-04-11 (×10): qty 2.5

## 2014-04-11 MED ORDER — HYDROMORPHONE HCL 1 MG/ML IJ SOLN
1.0000 mg | INTRAMUSCULAR | Status: DC | PRN
  Administered 2014-04-12 – 2014-04-13 (×3): 1 mg via INTRAVENOUS
  Filled 2014-04-11 (×3): qty 1

## 2014-04-11 MED ORDER — IBUPROFEN 800 MG PO TABS
800.0000 mg | ORAL_TABLET | Freq: Once | ORAL | Status: AC
  Administered 2014-04-11: 800 mg via ORAL
  Filled 2014-04-11: qty 1

## 2014-04-11 MED ORDER — LORAZEPAM 0.5 MG PO TABS
0.5000 mg | ORAL_TABLET | ORAL | Status: DC | PRN
  Administered 2014-04-11: 0.5 mg via ORAL
  Filled 2014-04-11: qty 1

## 2014-04-11 MED ORDER — IPRATROPIUM BROMIDE 0.02 % IN SOLN
0.5000 mg | Freq: Three times a day (TID) | RESPIRATORY_TRACT | Status: DC
  Administered 2014-04-11 (×2): 0.5 mg via RESPIRATORY_TRACT
  Filled 2014-04-11 (×2): qty 2.5

## 2014-04-11 NOTE — Consult Note (Addendum)
PULMONARY / CRITICAL CARE MEDICINE   Name: [name] MRN: [id number] DOB: Oct 17, 1955    ADMISSION DATE:  04/10/2014 CONSULTATION DATE:  04/11/2014  REFERRING MD :  Dr. Sheran Fava  CHIEF COMPLAINT:  Fever  INITIAL PRESENTATION:  58 yo female smoker sent to ER from [place] [facility] with weakness, fever, chest pain, productive cough and confusion.  She has hx of metastatic breast cancer on chemo (eribulin), COPD.  Developed progressive fever, tachycardia, dyspnea and PCCM consulted to assist with management.  STUDIES:  10/26 CT head >> Lt frontal parietal metastatic lesion with mild edema 10/26 CT abd/pelvis >> ATX b/l, CBD 15 mm, atrophic pancreas, extensive atherosclerosis, innumerable metastatic bone lesions 10/27 CT chest >> small b/l effusions with ATX, 1.9 cm Rt suprahilar mass, no PE  SIGNIFICANT EVENTS: 10/26 Admit  HISTORY OF PRESENT ILLNESS:   58 yo female smoker sent to ER from [facility] with weakness, fever, chest pain, productive cough and confusion.  She has hx of metastatic breast cancer on chemo (eribulin), COPD.  Developed progressive fever, tachycardia, dyspnea and PCCM consulted to assist with management.  PAST MEDICAL HISTORY :   has a past medical history of Acid reflux; Asthma; COPD (chronic obstructive pulmonary disease); Cancer; Seasonal allergies; Alcohol abuse; Elevated d-dimer; Allergy; Emphysema of lung; Hot flashes; Reading disorder; Impaired writing skills; Wears glasses; Wears dentures; HOH (hard of hearing); and S/P radiation therapy (01/13/14).  has past surgical history that includes Breast lumpectomy; Abdominal hysterectomy; and Portacath placement (Left, 12/30/2013). Prior to Admission medications   Medication Sig Start Date End Date Taking? Authorizing Provider  albuterol (PROVENTIL HFA;VENTOLIN HFA) 108 (90 BASE) MCG/ACT inhaler Inhale 2 puffs into the lungs every 6 (six) hours as needed for wheezing or shortness of breath. For shortness of  breath 02/27/14  Yes Lorayne Marek, MD  butalbital-acetaminophen-caffeine (FIORICET) 50-325-40 MG per tablet Take 1-2 tablets by mouth every 6 (six) hours as needed for headache. 01/29/14 01/29/15 Yes Kristen N Ward, DO  carbamide peroxide (DEBROX) 6.5 % otic solution Place 5 drops into both ears 2 (two) times daily as needed.   Yes Historical Provider, MD  cholecalciferol (VITAMIN D) 1000 UNITS tablet Take 2,000 Units by mouth daily.   Yes Historical Provider, MD  cyclobenzaprine (FLEXERIL) 5 MG tablet Take 1 tablet (5 mg total) by mouth 3 (three) times daily as needed for muscle spasms. 03/27/14  Yes Marcelino Duster, NP  Diphenhyd-Hydrocort-Nystatin (FIRST-DUKES MOUTHWASH) SUSP Use as directed 5 mLs in the mouth or throat 4 (four) times daily. Swish, gargle and spit one to two teaspoonfuls every six hours as needed. May be swallowed if esophageal involvement. 02/06/14  Yes Marcelino Duster, NP  fluticasone (FLOVENT HFA) 220 MCG/ACT inhaler Inhale 1 puff into the lungs 2 (two) times daily. 02/27/14  Yes Lorayne Marek, MD  hydrocortisone (ANUSOL-HC) 25 MG suppository Place 25 mg rectally 2 (two) times daily as needed. 01/23/14  Yes Marcelino Duster, NP  lidocaine-prilocaine (EMLA) cream Apply 1 application topically as needed. Apply over port area 1-2 hours before chemo, cover with plastic wrap 01/14/14  Yes Chauncey Cruel, MD  LORazepam (ATIVAN) 0.5 MG tablet Take 1 tablet (0.5 mg total) by mouth every 8 (eight) hours. 03/27/14  Yes Marcelino Duster, NP  ondansetron (ZOFRAN) 8 MG tablet Take 1 tablet (8 mg total) by mouth 2 (two) times daily. Start the day after chemo for 2 days. Then take as needed for nausea or vomiting. 01/16/14  Yes Virgie Dad Magrinat,  MD  oxyCODONE-acetaminophen (ROXICET) 5-325 MG per tablet Take 1-2 tablets by mouth every 4 (four) hours as needed. 03/06/14  Yes Marcelino Duster, NP  pantoprazole (PROTONIX) 40 MG tablet Take 1 tablet (40 mg total) by mouth daily. 01/23/14  Yes Marcelino Duster, NP  prochlorperazine (COMPAZINE) 10 MG tablet Take 1 tablet (10 mg total) by mouth every 6 (six) hours as needed (Nausea or vomiting). 01/16/14  Yes Chauncey Cruel, MD  amLODipine (NORVASC) 2.5 MG tablet Take 1 tablet (2.5 mg total) by mouth daily. 02/27/14   Lorayne Marek, MD   Allergies  Allergen Reactions  . Penicillins Other (See Comments)    Reaction a long time ago    FAMILY HISTORY:  indicated that her mother is deceased. She indicated that her father is deceased.  SOCIAL HISTORY:  reports that she has been smoking Cigarettes.  She has a 30 pack-year smoking history. She does not have any smokeless tobacco history on file. She reports that she drinks alcohol. She reports that she does not use illicit drugs.  REVIEW OF SYSTEMS:   Negative except above.  SUBJECTIVE:   VITAL SIGNS: Temp:  [100 F (37.8 C)-102.4 F (39.1 C)] 102.4 F (39.1 C) (10/27 1700) Pulse Rate:  [108-132] 132 (10/27 1700) Resp:  [18-44] 44 (10/27 1700) BP: (95-117)/(54-74) 117/71 mmHg (10/27 1700) SpO2:  [78 %-100 %] 78 % (10/27 1700) HEMODYNAMICS:   VENTILATOR SETTINGS:   INTAKE / OUTPUT:  Intake/Output Summary (Last 24 hours) at 04/11/14 1818 Last data filed at 04/11/14 1500  Gross per 24 hour  Intake 2016.67 ml  Output   2765 ml  Net -748.33 ml    PHYSICAL EXAMINATION: General:  Chronically ill appearing female, in mild respiratory distress. Neuro:  Alert and interactive, following all commands. HEENT:  [place]/AT, PERRL, EOM-I and MMM. Cardiovascular:  Regular but tachy, Nl S1/S2, -M/R/G. Lungs:  Distant breath sounds with end exp wheezes. Abdomen:  Soft, NT, ND and +BS. Musculoskeletal:  -edema and -tenderness. Skin: Intact.  LABS:  CBC  Recent Labs Lab 04/10/14 1537 04/10/14 2300 04/11/14 0435  WBC 16.4* 14.6* 13.0*  HGB 10.7* 9.0* 8.4*  HCT 32.0* 28.0* 25.8*  PLT 227 188 180   Coag's No results found for this basename: APTT, INR,  in the last 168  hours BMET  Recent Labs Lab 04/10/14 1428 04/10/14 1537 04/10/14 2300 04/11/14 0435  NA 135* 132*  --  133*  K 4.5 4.0  --  3.9  CL  --  92*  --  100  CO2 18* 17*  --  18*  BUN 22.6 23  --  16  CREATININE 1.2* 1.10 1.04 0.91  GLUCOSE 101 94  --  73   Electrolytes  Recent Labs Lab 04/10/14 1428 04/10/14 1537 04/11/14 0435  CALCIUM 12.8* 12.2* 9.9   Sepsis Markers  Recent Labs Lab 04/10/14 1553 04/10/14 1839  LATICACIDVEN 1.90 0.82   ABG  Recent Labs Lab 04/11/14 1750  PHART 7.342*  PCO2ART 31.5*  PO2ART 76.0*   Liver Enzymes  Recent Labs Lab 04/10/14 1428 04/10/14 1537 04/11/14 0435  AST 90* 87* 57*  ALT 19 15 11   ALKPHOS 107 108 85  BILITOT 0.49 0.4 0.2*  ALBUMIN 3.2* 3.4* 2.5*   Cardiac Enzymes  Recent Labs Lab 04/10/14 1537 04/10/14 2300  TROPONINI <0.30 <0.30  PROBNP 298.3*  --    Glucose No results found for this basename: GLUCAP,  in the last 168 hours   Lab Results  Component Value Date   TSH 5.360* 04/10/2014     Imaging Ct Head Wo Contrast  04/10/2014   CLINICAL DATA:  Stage IV breast cancer.  Confusion  EXAM: CT HEAD WITHOUT CONTRAST  TECHNIQUE: Contiguous axial images were obtained from the base of the skull through the vertex without intravenous contrast.  COMPARISON:  MRI 03/31/2012  FINDINGS: Left frontal parietal cortical enhancing lesion on MRI is noted. There is adjacent vasogenic edema on the CT which is unchanged from the MRI. No associated hemorrhage.  No other areas of mass or edema identified. No acute ischemic infarct. Ventricle size normal. Negative for hemorrhage  Mucoperiosteal thickening in the left sphenoid sinus compatible with chronic sinusitis.  IMPRESSION: Mild edema in the left frontal parietal white matter consistent with known metastatic deposit. No hemorrhage or other acute abnormality identified.   Electronically Signed   By: Franchot Gallo M.D.   On: 04/10/2014 16:50   Ct Abdomen Pelvis W  Contrast  04/10/2014   CLINICAL DATA:  58 year old female with generalized abdominal pain and weakness. Stage IV breast cancer.  EXAM: CT ABDOMEN AND PELVIS WITH CONTRAST  TECHNIQUE: Multidetector CT imaging of the abdomen and pelvis was performed using the standard protocol following bolus administration of intravenous contrast.  CONTRAST:  103mL OMNIPAQUE IOHEXOL 300 MG/ML SOLN, 112mL OMNIPAQUE IOHEXOL 300 MG/ML SOLN  COMPARISON:  CT of the chest, abdomen and pelvis 03/24/2014.  FINDINGS: Lower chest: Dependent atelectasis in lung bases bilaterally. Paraesophageal lymphadenopathy again noted measuring up to 1 cm in short axis.  Hepatobiliary: No focal cystic or solid hepatic lesions. Trace intrahepatic biliary ductal dilatation (unchanged). Common bile duct remains dilated in the porta hepatis measuring 15 mm on today's examination (decreased compared to prior). Status post cholecystectomy.  Pancreas: Atrophic, but otherwise unremarkable.  Spleen: Unremarkable.  Adrenals/Urinary Tract: Bilateral adrenal glands and bilateral kidneys are normal in appearance. No hydronephrosis to suggest urinary tract obstruction at this time. Foley balloon catheter in the lumen of the urinary bladder, which is completely decompressed.  Stomach/Bowel: The stomach is normal in appearance. No pathologic dilatation of small bowel or colon. Numerous colonic diverticulae are noted, without surrounding inflammatory changes to suggest an acute diverticulitis at this time.  Vascular/Lymphatic: Extensive atherosclerosis throughout the abdominal and pelvic vasculature, without evidence of aneurysm or dissection. Retroaortic left renal vein. Numerous reactive size lymph nodes are noted throughout the retroperitoneum (nonspecific). No pathologically enlarged lymph nodes are noted in the abdomen or pelvis.  Reproductive: Status post hysterectomy. Ovaries are not confidently identified may be surgically absent or atrophic.  Other: No significant  volume of ascites.  No pneumoperitoneum.  Musculoskeletal: Bones are diffusely heterogeneous in appearance with innumerable small lytic lesions, compatible with widespread metastatic disease throughout the skeleton.  IMPRESSION: 1. No acute findings in the abdomen or pelvis. 2. Persistent very mild intrahepatic biliary ductal dilatation, and decreasing extrahepatic biliary ductal dilatation. This is favored to represent "functional" dilatation in this post cholecystectomy its patient, however, correlation with liver function tests is recommended. The possibility of a distal ductal stricture or tiny ampullary lesion is difficult to entirely exclude, particularly if there are elevated liver function tests. 3. Colonic diverticulosis without findings to suggest acute diverticulitis at this time. 4. Interval placement of Foley catheter in the urinary bladder which appears properly located. 5. Additional incidental findings, as above, similar to the recent prior examination.   Electronically Signed   By: Vinnie Langton M.D.   On: 04/10/2014 20:04   Dg Chest [facility]  1 View  04/10/2014   CLINICAL DATA:  Generalized weakness, shortness of breath, fever, and confusion, history stage IV breast cancer  EXAM: PORTABLE CHEST - 1 VIEW  COMPARISON:  Portable exam 1533 hr compared to 04/03/2014  FINDINGS: RIGHT jugular Port-A-Cath with tip projecting over SVC near cavoatrial junction.  Borderline enlargement of cardiac silhouette.  Atherosclerotic calcification aorta.  Mediastinal contours and pulmonary vascularity normal.  Accentuated interstitial markings in part at least due to differences in technique.  Bibasilar atelectasis versus infiltrates, increased.  Upper lungs clear.  Questionable tiny LEFT pleural effusion.  No pneumothorax.  Bones demineralized.  IMPRESSION: Bibasilar atelectasis versus infiltrates.   Electronically Signed   By: Lavonia Dana M.D.   On: 04/10/2014 16:00     ASSESSMENT /  PLAN:  PULMONARY A: Acute respiratory distress in setting of sepsis. Reported hx of COPD with continue tobacco abuse. P:   Continue scheduled BD's with ipratropium/xopenex F/u CXR Oxygen to keep SpO2 > 92% BiPAP prn Bronchial hygiene Do not believe is a COPD exacerbation, will not start steroids.  CARDIOVASCULAR Lt port >> A:  Severe sepsis. P:  Continue IV fluids Check lactic acid Monitor hemodynamics  RENAL A:   Metabolic acidosis. P:   F/u BMET, optimize hemodynamics Monitor renal fx, urine outpt Replace electrolytes as indicated.  GASTROINTESTINAL A:   Nutrition.  P:   NPO until more stable Protonix for SUP  HEMATOLOGIC A:   Hx of metastatic breast cancer. Anemia of critical illness and chronic disease. P:  F/u CBC Lovenox for DVT prevention  INFECTIOUS A:   Severe sepsis ?source. P:   Day 2 of Abx, currently on cefepime, flagyl and vancomycin Check procalcitonin  Blood 10/26 >> Urine 10/26 >> negative Blood 10/27 >>  ENDOCRINE A:   Mild elevation in TSH from 10/26. P:   Monitor clinically  NEUROLOGIC A:   Acute encephalopathy 2nd to sepsis. P:   Monitor mental status   FAMILY  - Updates:   TODAY'S SUMMARY: 58 year old female with PMH of metastatic breast cancer who presents to the ICU with tachycardia, fever, marginal BP and respiratory failure.  Requiring BiPAP.    Continue abx.  Fluid resuscitation, do not allow for O2 sat to increase more than 92%.    I have personally obtained a history, examined the patient, evaluated laboratory and imaging results, formulated the assessment and plan and placed orders.  CRITICAL CARE: The patient is critically ill with multiple organ systems failure and requires high complexity decision making for assessment and support, frequent evaluation and titration of therapies, application of advanced monitoring technologies and extensive interpretation of multiple databases. Critical Care Time devoted to  patient care services described in this note is 40 minutes.   Rush Farmer, M.D. [facility] Pulmonary/Critical Care Medicine. Pager: [phone]. After hours pager: [phone].  04/11/2014, 6:18 PM

## 2014-04-11 NOTE — Progress Notes (Signed)
1700:  Temperature 102.4, Respirations 44, HR 132.  O2 sats dropped to 78-83% on 2LNC.  Increased O2 to 4 liters then 6 liters.  O2 sats now upper 90's on 6LNC.  Notified Dr. Jerilynn Mages. Short.  She came and assessed the patient.

## 2014-04-11 NOTE — Progress Notes (Signed)
Report given to Angela, rn

## 2014-04-11 NOTE — Progress Notes (Signed)
[facility] PROGRESS NOTE  Dawson Hollman QMG:[id number] DOB: Sep 13, 1955 DOA: 04/10/2014 PCP: Lorayne Marek, MD  Brief Summary  [name] is a 58 y.o. female history of metastatic breast cancer, COPD and hypertension was referred to the ER from the [facility] after patient was found to be weak and confused. Patient had gone to her regular chemotherapy and was found to be confused and weak.  In the ER, she was febrile and complaining of right lower chest pain, productive cough, and possible abdominal pain.  Chest x-ray demonstrated bilateral atelectasis versus infiltrates and CT abdomen and pelvis was unremarkable. Patient has been started on empiric antibiotics for possible sepsis and admitted to stepdown.  Assessment/Plan  Sepsis, unclear source but probably pneumonia.   -  Continue vancomycin and change to cefepime (tolerated cephalosporins previously)  -  CT angio pending to rule out PE -  F/u blood cultures -  Legionella and strep pneumo ag -  Flu PCR -  Sputum culture if able -  UA neg -  CT abd/pelvis neg  Acute encephalopathy due to sepsis and high fever -  Minimize sedating medications if able -  Frequent reorientation -  Treat infection  Generalized weakness from infection and dehydration.  IVF and abx -  PT/OT consults  Metastatic breast cancer -  Dr. Jana Hakim on rounding list  Hypercalcemia likely secondary to dehydration and already improved with IVF -  Dental exam and zoledronic acid to be managed by Oncology  HTN with borderline low BPs -  Hold BP medications  COPD with tachypnea -  Continue flovent -  Add duonebs with albuterol prn  Leukocytosis due to sepsis, resolving with IVF and abx  Anemia of chronic disease, hemoglobin trending down with IVF -  Transfuse for hgb < 7 or symptomatic anemia -  Repeat CBC in AM  Metabolic acidosis with mild gap likely due to ketosis and dehydration.  Lactic acid was normal.  Improving with IVF -   Continue IV hydration   Sick euthyroid -  Repeat TSH in 4-6 weeks  Diet:  regular Access:  port IVF:  yes Proph:  lovenox  Code Status: full Family Communication: patient alone Disposition Plan: continue stepdown for today   Consultants:  Oncology added to rounding list  Procedures:  10/26 CT head unremarkable  10/26 CT abd/pelvis neg  10/26 CXR bibasilar PNA vs. aletelectasis  Antibiotics:  Vancomycin 10/26 >>  Aztreonam 10/26 > 10/27  Levofloxacin 10/26   Cefepime 10/27 >>  HPI/Subjective:  Feeling a little better this morning, but still having severe pain in the right lateral chest "on the inside".  Denies SOB, but coughing. Denies nausea, vomiting, diarrhea.    Objective: Filed Vitals:   04/11/14 0400 04/11/14 0500 04/11/14 0600 04/11/14 0700  BP: 109/69 101/56 108/59 95/60  Pulse: 108 108 110 109  Temp: 100.4 F (38 C) 100.6 F (38.1 C) 100.6 F (38.1 C) 100.4 F (38 C)  TempSrc: Core (Comment) Core (Comment) Core (Comment)   Resp: 36 21 25 21   Height:      Weight:      SpO2: 99% 94% 95% 96%    Intake/Output Summary (Last 24 hours) at 04/11/14 0743 Last data filed at 04/11/14 0600  Gross per 24 hour  Intake 1116.67 ml  Output   1715 ml  Net -598.33 ml   Filed Weights   04/10/14 1522  Weight: 52.617 kg (116 lb)    Exam:   General:  Thin BF, tachypneic, warm to  touch  HEENT:  NCAT, MMM  Cardiovascular:  Tachycardic RR, nl S1, S2 no mrg, 2+ pulses, warm extremities  Respiratory:  CTAB, tachypneic on O2 WOB  Abdomen:   NABS, soft, mildly distended and nontender but firm with possible hepatomegaly  MSK:   Decreased tone and bulk, no LEE  Neuro:  Grossly moves all extremities  Data Reviewed: Basic Metabolic Panel:  Recent Labs Lab 04/10/14 1428 04/10/14 1537 04/10/14 2300 04/11/14 0435  NA 135* 132*  --  133*  K 4.5 4.0  --  3.9  CL  --  92*  --  100  CO2 18* 17*  --  18*  GLUCOSE 101 94  --  73  BUN 22.6 23  --  16   CREATININE 1.2* 1.10 1.04 0.91  CALCIUM 12.8* 12.2*  --  9.9   Liver Function Tests:  Recent Labs Lab 04/10/14 1428 04/10/14 1537 04/11/14 0435  AST 90* 87* 57*  ALT 19 15 11   ALKPHOS 107 108 85  BILITOT 0.49 0.4 0.2*  PROT 7.9 7.9 6.2  ALBUMIN 3.2* 3.4* 2.5*    Recent Labs Lab 04/10/14 1537  LIPASE 9*    Recent Labs Lab 04/10/14 1537  AMMONIA 44   CBC:  Recent Labs Lab 04/10/14 1427 04/10/14 1537 04/10/14 2300 04/11/14 0435  WBC 16.7* 16.4* 14.6* 13.0*  NEUTROABS 13.6* 13.3*  --  10.8*  HGB 11.2* 10.7* 9.0* 8.4*  HCT 33.7* 32.0* 28.0* 25.8*  MCV 92.3 92.0 94.6 94.2  PLT 222 227 188 180   Cardiac Enzymes:  Recent Labs Lab 04/10/14 1537 04/10/14 2300  TROPONINI <0.30 <0.30   BNP (last 3 results)  Recent Labs  04/10/14 1537  PROBNP 298.3*   CBG: No results found for this basename: GLUCAP,  in the last 168 hours  Recent Results (from the past 240 hour(s))  URINE CULTURE     Status: None   Collection Time    04/03/14  9:44 PM      Result Value Ref Range Status   Specimen Description URINE, CLEAN CATCH   Final   Special Requests NONE   Final   Culture  Setup Time     Final   Value: 04/04/2014 01:52     Performed at [facility]     Final   Value: NO GROWTH     Performed at [facility]   Culture     Final   Value: NO GROWTH     Performed at [facility]   Report Status 04/04/2014 FINAL   Final  TECHNOLOGIST REVIEW     Status: None   Collection Time    04/10/14  2:27 PM      Result Value Ref Range Status   Technologist Review 2% Nrbcs, 3% metamyelocytes   Final  MRSA PCR SCREENING     Status: None   Collection Time    04/10/14 10:20 PM      Result Value Ref Range Status   MRSA by PCR NEGATIVE  NEGATIVE Final   Comment:            The GeneXpert MRSA Assay (FDA     approved for NASAL specimens     only), is one component of a     comprehensive MRSA colonization     surveillance program. It  is not     intended to diagnose MRSA     infection nor to guide or  monitor treatment for     MRSA infections.     Studies: Ct Head Wo Contrast  04/10/2014   CLINICAL DATA:  Stage IV breast cancer.  Confusion  EXAM: CT HEAD WITHOUT CONTRAST  TECHNIQUE: Contiguous axial images were obtained from the base of the skull through the vertex without intravenous contrast.  COMPARISON:  MRI 03/31/2012  FINDINGS: Left frontal parietal cortical enhancing lesion on MRI is noted. There is adjacent vasogenic edema on the CT which is unchanged from the MRI. No associated hemorrhage.  No other areas of mass or edema identified. No acute ischemic infarct. Ventricle size normal. Negative for hemorrhage  Mucoperiosteal thickening in the left sphenoid sinus compatible with chronic sinusitis.  IMPRESSION: Mild edema in the left frontal parietal white matter consistent with known metastatic deposit. No hemorrhage or other acute abnormality identified.   Electronically Signed   By: Franchot Gallo M.D.   On: 04/10/2014 16:50   Ct Abdomen Pelvis W Contrast  04/10/2014   CLINICAL DATA:  58 year old female with generalized abdominal pain and weakness. Stage IV breast cancer.  EXAM: CT ABDOMEN AND PELVIS WITH CONTRAST  TECHNIQUE: Multidetector CT imaging of the abdomen and pelvis was performed using the standard protocol following bolus administration of intravenous contrast.  CONTRAST:  73mL OMNIPAQUE IOHEXOL 300 MG/ML SOLN, 165mL OMNIPAQUE IOHEXOL 300 MG/ML SOLN  COMPARISON:  CT of the chest, abdomen and pelvis 03/24/2014.  FINDINGS: Lower chest: Dependent atelectasis in lung bases bilaterally. Paraesophageal lymphadenopathy again noted measuring up to 1 cm in Vincy Feliz axis.  Hepatobiliary: No focal cystic or solid hepatic lesions. Trace intrahepatic biliary ductal dilatation (unchanged). Common bile duct remains dilated in the porta hepatis measuring 15 mm on today's examination (decreased compared to prior). Status post  cholecystectomy.  Pancreas: Atrophic, but otherwise unremarkable.  Spleen: Unremarkable.  Adrenals/Urinary Tract: Bilateral adrenal glands and bilateral kidneys are normal in appearance. No hydronephrosis to suggest urinary tract obstruction at this time. Foley balloon catheter in the lumen of the urinary bladder, which is completely decompressed.  Stomach/Bowel: The stomach is normal in appearance. No pathologic dilatation of small bowel or colon. Numerous colonic diverticulae are noted, without surrounding inflammatory changes to suggest an acute diverticulitis at this time.  Vascular/Lymphatic: Extensive atherosclerosis throughout the abdominal and pelvic vasculature, without evidence of aneurysm or dissection. Retroaortic left renal vein. Numerous reactive size lymph nodes are noted throughout the retroperitoneum (nonspecific). No pathologically enlarged lymph nodes are noted in the abdomen or pelvis.  Reproductive: Status post hysterectomy. Ovaries are not confidently identified may be surgically absent or atrophic.  Other: No significant volume of ascites.  No pneumoperitoneum.  Musculoskeletal: Bones are diffusely heterogeneous in appearance with innumerable small lytic lesions, compatible with widespread metastatic disease throughout the skeleton.  IMPRESSION: 1. No acute findings in the abdomen or pelvis. 2. Persistent very mild intrahepatic biliary ductal dilatation, and decreasing extrahepatic biliary ductal dilatation. This is favored to represent "functional" dilatation in this post cholecystectomy its patient, however, correlation with liver function tests is recommended. The possibility of a distal ductal stricture or tiny ampullary lesion is difficult to entirely exclude, particularly if there are elevated liver function tests. 3. Colonic diverticulosis without findings to suggest acute diverticulitis at this time. 4. Interval placement of Foley catheter in the urinary bladder which appears properly  located. 5. Additional incidental findings, as above, similar to the recent prior examination.   Electronically Signed   By: Vinnie Langton M.D.   On: 04/10/2014 20:04   Dg Chest  Port 1 View  04/10/2014   CLINICAL DATA:  Generalized weakness, shortness of breath, fever, and confusion, history stage IV breast cancer  EXAM: PORTABLE CHEST - 1 VIEW  COMPARISON:  Portable exam 1533 hr compared to 04/03/2014  FINDINGS: RIGHT jugular Port-A-Cath with tip projecting over SVC near cavoatrial junction.  Borderline enlargement of cardiac silhouette.  Atherosclerotic calcification aorta.  Mediastinal contours and pulmonary vascularity normal.  Accentuated interstitial markings in part at least due to differences in technique.  Bibasilar atelectasis versus infiltrates, increased.  Upper lungs clear.  Questionable tiny LEFT pleural effusion.  No pneumothorax.  Bones demineralized.  IMPRESSION: Bibasilar atelectasis versus infiltrates.   Electronically Signed   By: Lavonia Dana M.D.   On: 04/10/2014 16:00    Scheduled Meds: . antiseptic oral rinse  7 mL Mouth Rinse q12n4p  . aztreonam  1 g Intravenous Q8H  . chlorhexidine  15 mL Mouth Rinse BID  . cholecalciferol  2,000 Units Oral Daily  . enoxaparin (LOVENOX) injection  40 mg Subcutaneous Q24H  . fluticasone  1 puff Inhalation BID  . levofloxacin (LEVAQUIN) IV  750 mg Intravenous Q24H  . LORazepam  0.5 mg Oral 3 times per day  . ondansetron  8 mg Oral BID  . pantoprazole  40 mg Oral Daily  . vancomycin  500 mg Intravenous Q12H   Continuous Infusions: . sodium chloride 100 mL/hr at 04/11/14 0441    Principal Problem:   Sepsis Active Problems:   COPD (chronic obstructive pulmonary disease)   Breast cancer metastasized to bone   Hypertension    Time spent: 30 min    Izsak Meir, [facility] Pager [phone]. If 7PM-7AM, please contact night-coverage at [email or web address], password [facility] 04/11/2014, 7:43 AM  LOS: 1 day

## 2014-04-11 NOTE — Progress Notes (Signed)
INITIAL NUTRITION ASSESSMENT  DOCUMENTATION CODES Per approved criteria  -Not Applicable   INTERVENTION: -Recommend Ensure Complete po BID PRN, each supplement provides 350 kcal and 13 grams of protein -Continue with liberalized diet to encourage PO, utilize soft foods as pt's dentures are at home -RD to continue to monitor  NUTRITION DIAGNOSIS: Increased nutrient needs (protien/kcal) related to chronic disease as evidenced by breast cancer w/mets/COPD.   Goal: Pt to meet >/= 90% of their estimated nutrition needs    Monitor:  Total protein/energy intake, labs, weights, supplement needs  Reason for Assessment: MST  58 y.o. female  Admitting Dx: Sepsis  ASSESSMENT: [name] is a 58 y.o. female history of metastatic breast cancer, COPD and hypertension was referred to the ER from the [facility] after patient was found to be weak and confused  -Family reported pt with one-two days of decreased appetite d/t weakness and confusion. Prior to this period, pt consumed 2-3 meals daily. Denied use of nutritional supplements or chemo related nutrition side effects  -Reported weight has remained stable. Previous medical records indicate a possible 6 lb weight loss over past month (4.8% body weight loss, non-significant for time frame); however pt was admitted with dehydration, which is likely impacting current weight -Pt consuming approximately 50% of breakfast. Assisted family is ordering pt's meals, noted that she would likely tolerate softer foods as pt's dentures are at home -D/t pt's hx of metastatic breast cancer and COPD, would likely benefit from nutrition supplement if PO intake does not improve to baseline. Will order PRN to utilize as warranted  Height: Ht Readings from Last 1 Encounters:  04/10/14 4\' 11"  (1.499 m)    Weight: Wt Readings from Last 1 Encounters:  04/10/14 116 lb (52.617 kg)    Ideal Body Weight: 98 lbs  % Ideal Body Weight: 118%  Wt Readings  from Last 10 Encounters:  04/10/14 116 lb (52.617 kg)  04/05/14 115 lb 8 oz (52.39 kg)  04/03/14 123 lb 14.4 oz (56.201 kg)  03/27/14 116 lb 12.8 oz (52.98 kg)  03/20/14 118 lb 9.6 oz (53.797 kg)  03/06/14 123 lb 6.4 oz (55.974 kg)  02/27/14 124 lb 9.6 oz (56.518 kg)  02/27/14 122 lb (55.339 kg)  02/27/14 123 lb 4.8 oz (55.929 kg)  02/13/14 115 lb 3.2 oz (52.254 kg)    Usual Body Weight: 115-124 lbs per previous medical records  % Usual Body Weight: 100%  BMI:  Body mass index is 23.42 kg/(m^2).  Estimated Nutritional Needs: Kcal: 1600-1800 Protein: 70-80 gram Fluid: >/= 1600 ml dialy  Skin: WDL  Diet Order: General  EDUCATION NEEDS: -No education needs identified at this time   Intake/Output Summary (Last 24 hours) at 04/11/14 1146 Last data filed at 04/11/14 0600  Gross per 24 hour  Intake 1116.67 ml  Output   1715 ml  Net -598.33 ml    Last BM: PTA- hypoactive BS   Labs:   Recent Labs Lab 04/10/14 1428 04/10/14 1537 04/10/14 2300 04/11/14 0435  NA 135* 132*  --  133*  K 4.5 4.0  --  3.9  CL  --  92*  --  100  CO2 18* 17*  --  18*  BUN 22.6 23  --  16  CREATININE 1.2* 1.10 1.04 0.91  CALCIUM 12.8* 12.2*  --  9.9  GLUCOSE 101 94  --  73    CBG (last 3)  No results found for this basename: GLUCAP,  in the last 72 hours  Scheduled Meds: . antiseptic oral rinse  7 mL Mouth Rinse q12n4p  . ceFEPime (MAXIPIME) IV  1 g Intravenous Q8H  . chlorhexidine  15 mL Mouth Rinse BID  . cholecalciferol  2,000 Units Oral Daily  . fluticasone  1 puff Inhalation BID  . ipratropium  0.5 mg Nebulization TID  . levalbuterol  0.63 mg Nebulization TID  . ondansetron  8 mg Oral BID  . pantoprazole  40 mg Oral Daily  . vancomycin  500 mg Intravenous Q12H    Continuous Infusions: . sodium chloride 100 mL/hr at 04/11/14 0441    Past Medical History  Diagnosis Date  . Acid reflux   . Asthma   . COPD (chronic obstructive pulmonary disease)   . Cancer      lumpectomy, radiation 2009  . Seasonal allergies   . Alcohol abuse   . Elevated d-dimer     negative chest CT  . Allergy   . Emphysema of lung   . Hot flashes   . Reading disorder   . Impaired writing skills   . Wears glasses   . Wears dentures     top  . HOH (hard of hearing)     left ear  . S/P radiation therapy 01/13/14    SRS left parietal 20Gy    Past Surgical History  Procedure Laterality Date  . Breast lumpectomy    . Abdominal hysterectomy    . Portacath placement Left 12/30/2013    Procedure: INSERTION PORT-A-CATH;  Surgeon: Merrie Roof, MD;  Location: [facility];  Service: General;  Laterality: Left;  subclavian area    Atlee Abide [place] Clinical Dietitian [phone]

## 2014-04-11 NOTE — Progress Notes (Signed)
Called by RN for worsening in patient clinical status:  Fever, tachypnea, tachycardia.    S:  Patient still having right chest pain and feeling generally unwell.  Having some cough and shortness of breath with subjective fevers.    O:   T 102.26F, HR 130s, RR 40s, O2 91% on 6L [place] breathing shallowly.  GEN:  Cachectic ill-appearing female with fast shallow breathing CV:  Tachycardic, RR PULM:  CTAB ABD:  NABS, soft, distended, mildly TTP diffusely without rebound or guarding MSK:  No LEE  A/P:   May have worsening tachypnea and tachycardia secondary to fever, but worried that patient may need additional support of sepsis.   -  ABG -  Repeat CMP, CBC, lactic acid -  NPO and medications via IV for now -  Repeat blood cultures from port and from periphery -  Continue vanc and cefepime and add flagyl for additional anaerobic coverage  -  Case discussed with critical care who will consult

## 2014-04-11 NOTE — Progress Notes (Signed)
ANTIBIOTIC CONSULT NOTE - FOLLOW UP  Pharmacy Consult for Vancomycin, Cefepime Indication: sepsis, HCAP  Allergies  Allergen Reactions  . Penicillins Other (See Comments)    Reaction a long time ago    Patient Measurements: Height: 4\' 11"  (149.9 cm) Weight: 116 lb (52.617 kg) IBW/kg (Calculated) : 43.2  Vital Signs: Temp: 100.4 F (38 C) (10/27 0700) Temp Source: Core (Comment) (10/27 0600) BP: 95/60 mmHg (10/27 0700) Pulse Rate: 109 (10/27 0700) Intake/Output from previous day: 10/26 0701 - 10/27 0700 In: 1116.7 [P.O.:240; I.V.:726.7; IV Piggyback:150] Out: 1715 [Urine:1715]  Labs:  Recent Labs  04/10/14 1537 04/10/14 2300 04/11/14 0435  WBC 16.4* 14.6* 13.0*  HGB 10.7* 9.0* 8.4*  PLT 227 188 180  CREATININE 1.10 1.04 0.91   Estimated Creatinine Clearance: 49.4 ml/min (by C-G formula based on Cr of 0.91). No results found for this basename: Letta Median, VANCORANDOM, [place], GENTPEAK, [place], [place], [place], TOBRARND, AMIKACINPEAK, AMIKACINTROU, AMIKACIN,  in the last 72 hours   Microbiology: Recent Results (from the past 720 hour(s))  TECHNOLOGIST REVIEW     Status: None   Collection Time    03/20/14 11:01 AM      Result Value Ref Range Status   Technologist Review Occ Metas and Myelocytes present   Final  TECHNOLOGIST REVIEW     Status: None   Collection Time    03/27/14 10:20 AM      Result Value Ref Range Status   Technologist Review Oc meta and myelocyte   Final  URINE CULTURE     Status: None   Collection Time    04/03/14  9:44 PM      Result Value Ref Range Status   Specimen Description URINE, CLEAN CATCH   Final   Special Requests NONE   Final   Culture  Setup Time     Final   Value: 04/04/2014 01:52     Performed at [facility]     Final   Value: NO GROWTH     Performed at [facility]   Culture     Final   Value: NO GROWTH     Performed at [facility]   Report Status  04/04/2014 FINAL   Final  TECHNOLOGIST REVIEW     Status: None   Collection Time    04/10/14  2:27 PM      Result Value Ref Range Status   Technologist Review 2% Nrbcs, 3% metamyelocytes   Final  MRSA PCR SCREENING     Status: None   Collection Time    04/10/14 10:20 PM      Result Value Ref Range Status   MRSA by PCR NEGATIVE  NEGATIVE Final   Comment:            The GeneXpert MRSA Assay (FDA     approved for NASAL specimens     only), is one component of a     comprehensive MRSA colonization     surveillance program. It is not     intended to diagnose MRSA     infection nor to guide or     monitor treatment for     MRSA infections.     Assessment: 58yo F sent over from [facility] on 10/26 with fever, weakness, and AMS.  PMH includes COPD, HTN, and stage IV breast cancer.  She receives chemo at [facility], last given on 03/27/14; her chemo 10/26 was held and she was transferred to ED.  CXR shows bilateral atelectasis vs infiltrates.  Pharmacy is consulted to dose vancomycin and cefepime for r/o sepsis.  10/26 >> Vanc >> 10/26 >> Aztreonam >>  10/27 10/26 >> Levaquin >> 10/27 10/27 >> Cefepime >>   Today, 10/27  Tmax: 100.4  WBCs: improving, 13  Renal: SCr 0.91, CrCl 49 ml/min  Blood and urine cultures pending  Goal of Therapy:  Vancomycin trough level 15-20 mcg/ml  Plan:   Cefepime 1g IV q8h  Continue Vancomycin 500 mg IV q8h.  Measure Vanc trough at steady state.  Follow up renal fxn and culture results.  Gretta Arab PharmD, BCPS Pager [phone] 04/11/2014 8:13 AM

## 2014-04-11 NOTE — Care Management Note (Signed)
    Page 1 of 2   04/14/2014     1:25:20 PM CARE MANAGEMENT NOTE 04/14/2014  Patient:  [name], [name]   Account Number:  [id number]  Date Initiated:  04/11/2014  Documentation initiated by:  Tonjua Rossetti  Subjective/Objective Assessment:   sepsis     Action/Plan:   home when stable   Anticipated DC Date:  04/17/2014   Anticipated DC Plan:  HOME/SELF CARE  In-house referral  NA      DC Planning Services  NA      [facility] [address] Choice  NA   Choice offered to / List presented to:  NA   DME arranged  NA      DME agency  NA     [place] arranged  NA      [place] agency  NA   Status of service:  In process, will continue to follow Medicare Important Message given?   (If response is "NO", the following Medicare IM given date fields will be blank) Date Medicare IM given:   Medicare IM given by:   Date Additional Medicare IM given:   Additional Medicare IM given by:    Discharge Disposition:    Per UR Regulation:  Reviewed for med. necessity/level of care/duration of stay  If discussed at [place] of Stay Meetings, dates discussed:    Comments:  [id number]/Sarrinah Gardin Rosana Hoes, RN, BSN, CCM Chart reviewed. Discharge needs and patient's stay to be reviewed and followed by case manager. chart note: Increased wob, bronchospastic, ultrasound chest with very small rt effusion and sliver on left, not tapped. Diuresis and Start steroids on 10/30. She is at some risk for intubation.     [id number] Rosana Hoes, RN, BSN, CCM Chart reviewed. Discharge needs and patient's stay to be reviewed and followed by case manager. note from chart: 58 y.o. female history of metastatic breast cancer, COPD and hypertension was referred to the ER from the [place] [facility] after patient was found to be weak and confused. Patient had gone to her regular chemotherapy and was found to be confused and weak.  In the ER, she was febrile and complaining of right lower chest pain, productive cough, and possible abdominal  pain.  Chest x-ray demonstrated bilateral atelectasis versus infiltrates and CT abdomen and pelvis was unremarkable. Patient has been started on empiric antibiotics for possible sepsis and admitted to stepdown.

## 2014-04-12 ENCOUNTER — Inpatient Hospital Stay (HOSPITAL_COMMUNITY): Payer: Medicaid Other

## 2014-04-12 DIAGNOSIS — J449 Chronic obstructive pulmonary disease, unspecified: Secondary | ICD-10-CM

## 2014-04-12 LAB — VANCOMYCIN, TROUGH: Vancomycin Tr: 8.5 ug/mL — ABNORMAL LOW (ref 10.0–20.0)

## 2014-04-12 LAB — BASIC METABOLIC PANEL
Anion gap: 17 — ABNORMAL HIGH (ref 5–15)
BUN: 13 mg/dL (ref 6–23)
CO2: 19 meq/L (ref 19–32)
CREATININE: 0.75 mg/dL (ref 0.50–1.10)
Calcium: 10.1 mg/dL (ref 8.4–10.5)
Chloride: 104 mEq/L (ref 96–112)
GFR calc Af Amer: >90 mL/min (ref >90)
GFR calc non Af Amer: >90 mL/min (ref >90)
Glucose, Bld: 176 mg/dL — ABNORMAL HIGH (ref 70–99)
POTASSIUM: 3.6 meq/L — AB (ref 3.7–5.3)
Sodium: 140 mEq/L (ref 137–147)

## 2014-04-12 LAB — CBC
HEMATOCRIT: 24.2 % — AB (ref 36.0–46.0)
Hemoglobin: 8.1 g/dL — ABNORMAL LOW (ref 12.0–15.0)
MCH: 30.5 pg (ref 26.0–34.0)
MCHC: 33.5 g/dL (ref 30.0–36.0)
MCV: 91 fL (ref 78.0–100.0)
Platelets: 187 10*3/uL (ref 150–400)
RBC: 2.66 MIL/uL — AB (ref 3.87–5.11)
RDW: 19.5 % — ABNORMAL HIGH (ref 11.5–15.5)
WBC: 14.4 10*3/uL — ABNORMAL HIGH (ref 4.0–10.5)

## 2014-04-12 LAB — MAGNESIUM: MAGNESIUM: 2 mg/dL (ref 1.5–2.5)

## 2014-04-12 LAB — PROCALCITONIN: Procalcitonin: 4.01 ng/mL

## 2014-04-12 LAB — PHOSPHORUS: PHOSPHORUS: 6.2 mg/dL — AB (ref 2.3–4.6)

## 2014-04-12 MED ORDER — VANCOMYCIN HCL 500 MG IV SOLR
500.0000 mg | Freq: Once | INTRAVENOUS | Status: AC
  Administered 2014-04-12: 500 mg via INTRAVENOUS
  Filled 2014-04-12: qty 500

## 2014-04-12 MED ORDER — VANCOMYCIN HCL IN DEXTROSE 1-5 GM/200ML-% IV SOLN
1000.0000 mg | Freq: Two times a day (BID) | INTRAVENOUS | Status: DC
  Administered 2014-04-13 – 2014-04-15 (×5): 1000 mg via INTRAVENOUS
  Filled 2014-04-12 (×5): qty 200

## 2014-04-12 MED ORDER — SODIUM CHLORIDE 0.9 % IV SOLN
INTRAVENOUS | Status: DC
  Administered 2014-04-12 (×2): via INTRAVENOUS

## 2014-04-12 MED ORDER — POTASSIUM CHLORIDE 10 MEQ/50ML IV SOLN
10.0000 meq | INTRAVENOUS | Status: AC
  Administered 2014-04-12 (×2): 10 meq via INTRAVENOUS
  Filled 2014-04-12 (×2): qty 50

## 2014-04-12 NOTE — Progress Notes (Signed)
[facility] PROGRESS NOTE  [name] HQI:[id number] DOB: 1955-11-18 DOA: 04/10/2014 PCP: Lorayne Marek, MD  Brief Summary  [name] is a 58 y.o. female history of metastatic breast cancer, COPD and hypertension was referred to the ER from the [place] after patient was found to be weak and confused. Patient had gone to her regular chemotherapy and was found to be confused and weak.  In the ER, she was febrile and complaining of right lower chest pain, productive cough, and possible abdominal pain.  Chest x-ray demonstrated bilateral atelectasis versus infiltrates and CT abdomen and pelvis was unremarkable. Patient has been started on empiric antibiotics for possible sepsis and admitted to stepdown.  Assessment/Plan  Sepsis,  probably from pneumonia.   -  Continue vancomycin and change to cefepime (tolerated cephalosporins previously)  -  CT angio negative for PE.  -  F/u blood cultures no growth to date.  -  Legionella and strep pneumo ag negative.  -  Flu PCR negative -  Sputum culture if able -  UA neg -  CT abd/pelvis neg  Acute encephalopathy due to sepsis and high fever -  Minimize sedating medications if able -  Treat infection  Generalized weakness from infection and dehydration.  IVF and abx -  PT/OT consults  Metastatic breast cancer -  Dr. Jana Hakim on rounding list  Hypercalcemia likely secondary to dehydration and already improved with IVF -  Dental exam and zoledronic acid to be managed by Oncology  HTN with borderline low BPs -  Hold BP medications  COPD;  -  Continue flovent -  duonebs with albuterol prn  Leukocytosis due to sepsis, continue with  abx  Anemia of chronic disease, hemoglobin trending down with IVF -  Transfuse for hgb < 7 or symptomatic anemia - Hb stable at 8.   Metabolic acidosis with mild gap likely due to ketosis and dehydration.  Lactic acid was normal.  Improving with IVF -  Continue IV hydration   Sick  euthyroid -  Repeat TSH in 4-6 weeks  Diet:  regular Access:  port IVF:  yes Proph:  lovenox  Code Status: full Family Communication: patient alone Disposition Plan: continue stepdown for today   Consultants:  Oncology added to rounding list  Procedures:  10/26 CT head unremarkable  10/26 CT abd/pelvis neg  10/26 CXR bibasilar PNA vs. aletelectasis  Antibiotics:  Vancomycin 10/26 >>  Aztreonam 10/26 > 10/27  Levofloxacin 10/26   Cefepime 10/27 >>  HPI/Subjective: Breathing better, no significant cough. No abdominal pain.  No events overnight.   Objective: Filed Vitals:   04/12/14 0400 04/12/14 0500 04/12/14 0600 04/12/14 0700  BP: 94/58 103/55 95/52 98/50   Pulse: 106 104 107 110  Temp: 97.3 F (36.3 C) 97.3 F (36.3 C) 97.7 F (36.5 C) 97.5 F (36.4 C)  TempSrc: Core (Comment) Core (Comment) Core (Comment)   Resp: 15 14 19 17   Height:      Weight: 52.4 kg (115 lb 8.3 oz)     SpO2: 93% 93% 93% 93%    Intake/Output Summary (Last 24 hours) at 04/12/14 0828 Last data filed at 04/12/14 0600  Gross per 24 hour  Intake   1750 ml  Output   2505 ml  Net   -755 ml   Filed Weights   04/10/14 1522 04/12/14 0400  Weight: 52.617 kg (116 lb) 52.4 kg (115 lb 8.3 oz)    Exam:   General:  Thin BF, tachypneic.   Cardiovascular:  Tachycardic RR, nl S1, S2.   Respiratory:  CTAB, tachypneic.   Abdomen:   NABS, soft, mildly distended and nontender.   MSK:  No edema.   Neuro:  Alert and oriented.   Data Reviewed: Basic Metabolic Panel:  Recent Labs Lab 04/10/14 1428 04/10/14 1537 04/10/14 2300 04/11/14 0435 04/11/14 1750 04/12/14 0501  NA 135* 132*  --  133* 132* 140  K 4.5 4.0  --  3.9 3.7 3.6*  CL  --  92*  --  100 99 104  CO2 18* 17*  --  18* 16* 19  GLUCOSE 101 94  --  73 90 176*  BUN 22.6 23  --  16 13 13   CREATININE 1.2* 1.10 1.04 0.91 0.80 0.75  CALCIUM 12.8* 12.2*  --  9.9 10.2 10.1  MG  --   --   --   --   --  2.0  PHOS  --   --    --   --   --  6.2*   Liver Function Tests:  Recent Labs Lab 04/10/14 1428 04/10/14 1537 04/11/14 0435 04/11/14 1750  AST 90* 87* 57* 66*  ALT 19 15 11 11   ALKPHOS 107 108 85 92  BILITOT 0.49 0.4 0.2* 0.3  PROT 7.9 7.9 6.2 6.6  ALBUMIN 3.2* 3.4* 2.5* 2.5*    Recent Labs Lab 04/10/14 1537  LIPASE 9*    Recent Labs Lab 04/10/14 1537  AMMONIA 44   CBC:  Recent Labs Lab 04/10/14 1427 04/10/14 1537 04/10/14 2300 04/11/14 0435 04/11/14 1750 04/12/14 0501  WBC 16.7* 16.4* 14.6* 13.0* 13.5* 14.4*  NEUTROABS 13.6* 13.3*  --  10.8* 11.5*  --   HGB 11.2* 10.7* 9.0* 8.4* 8.8* 8.1*  HCT 33.7* 32.0* 28.0* 25.8* 27.1* 24.2*  MCV 92.3 92.0 94.6 94.2 92.5 91.0  PLT 222 227 188 180 211 187   Cardiac Enzymes:  Recent Labs Lab 04/10/14 1537 04/10/14 2300  TROPONINI <0.30 <0.30   BNP (last 3 results)  Recent Labs  04/10/14 1537  PROBNP 298.3*   CBG: No results found for this basename: GLUCAP,  in the last 168 hours  Recent Results (from the past 240 hour(s))  URINE CULTURE     Status: None   Collection Time    04/03/14  9:44 PM      Result Value Ref Range Status   Specimen Description URINE, CLEAN CATCH   Final   Special Requests NONE   Final   Culture  Setup Time     Final   Value: 04/04/2014 01:52     Performed at [place]     Final   Value: NO GROWTH     Performed at [facility]   Culture     Final   Value: NO GROWTH     Performed at [facility]   Report Status 04/04/2014 FINAL   Final  TECHNOLOGIST REVIEW     Status: None   Collection Time    04/10/14  2:27 PM      Result Value Ref Range Status   Technologist Review 2% Nrbcs, 3% metamyelocytes   Final  CULTURE, BLOOD (ROUTINE X 2)     Status: None   Collection Time    04/10/14  3:20 PM      Result Value Ref Range Status   Specimen Description BLOOD LEFT CHEST   Final   Special Requests BOTTLES DRAWN AEROBIC AND ANAEROBIC 5 CC EA  Final   Culture   Setup Time     Final   Value: 04/10/2014 21:17     Performed at [facility]   Culture     Final   Value:        BLOOD CULTURE RECEIVED NO GROWTH TO DATE CULTURE WILL BE HELD FOR 5 DAYS BEFORE ISSUING A FINAL NEGATIVE REPORT     Performed at [facility]   Report Status PENDING   Incomplete  CULTURE, BLOOD (ROUTINE X 2)     Status: None   Collection Time    04/10/14  3:25 PM      Result Value Ref Range Status   Specimen Description BLOOD LEFT ARM   Final   Special Requests BOTTLES DRAWN AEROBIC AND ANAEROBIC 8CC   Final   Culture  Setup Time     Final   Value: 04/11/2014 03:55     Performed at [facility]   Culture     Final   Value:        BLOOD CULTURE RECEIVED NO GROWTH TO DATE CULTURE WILL BE HELD FOR 5 DAYS BEFORE ISSUING A FINAL NEGATIVE REPORT     Performed at [facility]   Report Status PENDING   Incomplete  URINE CULTURE     Status: None   Collection Time    04/10/14  3:37 PM      Result Value Ref Range Status   Specimen Description URINE, CATHETERIZED   Final   Special Requests NONE   Final   Culture  Setup Time     Final   Value: 04/10/2014 21:31     Performed at [facility]     Final   Value: NO GROWTH     Performed at [facility]   Culture     Final   Value: NO GROWTH     Performed at [facility]   Report Status 04/11/2014 FINAL   Final  MRSA PCR SCREENING     Status: None   Collection Time    04/10/14 10:20 PM      Result Value Ref Range Status   MRSA by PCR NEGATIVE  NEGATIVE Final   Comment:            The GeneXpert MRSA Assay (FDA     approved for NASAL specimens     only), is one component of a     comprehensive MRSA colonization     surveillance program. It is not     intended to diagnose MRSA     infection nor to guide or     monitor treatment for     MRSA infections.  CULTURE, BLOOD (ROUTINE X 2)     Status: None   Collection Time    04/11/14  5:50 PM      Result  Value Ref Range Status   Specimen Description BLOOD LEFT ARM   Final   Special Requests BOTTLES DRAWN AEROBIC AND ANAEROBIC  10 CC   Final   Culture  Setup Time     Final   Value: 04/11/2014 22:33     Performed at [facility]   Culture     Final   Value:        BLOOD CULTURE RECEIVED NO GROWTH TO DATE CULTURE WILL BE HELD FOR 5 DAYS BEFORE ISSUING A FINAL NEGATIVE REPORT     Performed at [facility]   Report  Status PENDING   Incomplete  CULTURE, BLOOD (ROUTINE X 2)     Status: None   Collection Time    04/11/14  5:59 PM      Result Value Ref Range Status   Specimen Description BLOOD LEFT ARM   Final   Special Requests BOTTLES DRAWN AEROBIC AND ANAEROBIC  10 CC   Final   Culture  Setup Time     Final   Value: 04/11/2014 22:33     Performed at [facility]   Culture     Final   Value:        BLOOD CULTURE RECEIVED NO GROWTH TO DATE CULTURE WILL BE HELD FOR 5 DAYS BEFORE ISSUING A FINAL NEGATIVE REPORT     Performed at [facility]   Report Status PENDING   Incomplete     Studies: Ct Head Wo Contrast  04/10/2014   CLINICAL DATA:  Stage IV breast cancer.  Confusion  EXAM: CT HEAD WITHOUT CONTRAST  TECHNIQUE: Contiguous axial images were obtained from the base of the skull through the vertex without intravenous contrast.  COMPARISON:  MRI 03/31/2012  FINDINGS: Left frontal parietal cortical enhancing lesion on MRI is noted. There is adjacent vasogenic edema on the CT which is unchanged from the MRI. No associated hemorrhage.  No other areas of mass or edema identified. No acute ischemic infarct. Ventricle size normal. Negative for hemorrhage  Mucoperiosteal thickening in the left sphenoid sinus compatible with chronic sinusitis.  IMPRESSION: Mild edema in the left frontal parietal white matter consistent with known metastatic deposit. No hemorrhage or other acute abnormality identified.   Electronically Signed   By: Franchot Gallo M.D.   On: 04/10/2014 16:50    Ct Angio Chest Pe W/cm &/or Wo Cm  04/11/2014   CLINICAL DATA:  Chest pain.  Current history of breast cancer.  EXAM: CT ANGIOGRAPHY CHEST WITH CONTRAST  TECHNIQUE: Multidetector CT imaging of the chest was performed using the standard protocol during bolus administration of intravenous contrast. Multiplanar CT image reconstructions and MIPs were obtained to evaluate the vascular anatomy.  CONTRAST:  150mL OMNIPAQUE IOHEXOL 350 MG/ML SOLN  COMPARISON:  CT scan of April 03, 2014.  FINDINGS: Minimal bilateral pleural effusions are noted with adjacent subsegmental atelectasis. No pneumothorax is noted. 19 x 16 mm right suprahilar mass is again noted consistent with malignancy or metastatic disease. There is no evidence of pulmonary embolus. There is no evidence of thoracic aortic dissection or aneurysm. Stable prevascular, sub carinal, right hilar and right peritracheal adenopathy consistent with metastatic disease. Left subclavian Port-A-Cath is noted with distal tip at the cavoatrial junction. Visualized portion of upper abdomen appears normal. Stable right breast mass.  Review of the MIP images confirms the above findings.  IMPRESSION: There is no evidence of pulmonary embolus.  Minimal bilateral pleural effusions are noted with adjacent subsegmental atelectasis.  Stable mediastinal adenopathy and right suprahilar mass is noted consistent with metastatic disease.   Electronically Signed   By: Sabino Dick M.D.   On: 04/11/2014 10:42   Ct Abdomen Pelvis W Contrast  04/10/2014   CLINICAL DATA:  58 year old female with generalized abdominal pain and weakness. Stage IV breast cancer.  EXAM: CT ABDOMEN AND PELVIS WITH CONTRAST  TECHNIQUE: Multidetector CT imaging of the abdomen and pelvis was performed using the standard protocol following bolus administration of intravenous contrast.  CONTRAST:  32mL OMNIPAQUE IOHEXOL 300 MG/ML SOLN, 187mL OMNIPAQUE IOHEXOL 300 MG/ML SOLN  COMPARISON:  CT of  the chest,  abdomen and pelvis 03/24/2014.  FINDINGS: Lower chest: Dependent atelectasis in lung bases bilaterally. Paraesophageal lymphadenopathy again noted measuring up to 1 cm in short axis.  Hepatobiliary: No focal cystic or solid hepatic lesions. Trace intrahepatic biliary ductal dilatation (unchanged). Common bile duct remains dilated in the porta hepatis measuring 15 mm on today's examination (decreased compared to prior). Status post cholecystectomy.  Pancreas: Atrophic, but otherwise unremarkable.  Spleen: Unremarkable.  Adrenals/Urinary Tract: Bilateral adrenal glands and bilateral kidneys are normal in appearance. No hydronephrosis to suggest urinary tract obstruction at this time. Foley balloon catheter in the lumen of the urinary bladder, which is completely decompressed.  Stomach/Bowel: The stomach is normal in appearance. No pathologic dilatation of small bowel or colon. Numerous colonic diverticulae are noted, without surrounding inflammatory changes to suggest an acute diverticulitis at this time.  Vascular/Lymphatic: Extensive atherosclerosis throughout the abdominal and pelvic vasculature, without evidence of aneurysm or dissection. Retroaortic left renal vein. Numerous reactive size lymph nodes are noted throughout the retroperitoneum (nonspecific). No pathologically enlarged lymph nodes are noted in the abdomen or pelvis.  Reproductive: Status post hysterectomy. Ovaries are not confidently identified may be surgically absent or atrophic.  Other: No significant volume of ascites.  No pneumoperitoneum.  Musculoskeletal: Bones are diffusely heterogeneous in appearance with innumerable small lytic lesions, compatible with widespread metastatic disease throughout the skeleton.  IMPRESSION: 1. No acute findings in the abdomen or pelvis. 2. Persistent very mild intrahepatic biliary ductal dilatation, and decreasing extrahepatic biliary ductal dilatation. This is favored to represent "functional" dilatation in  this post cholecystectomy its patient, however, correlation with liver function tests is recommended. The possibility of a distal ductal stricture or tiny ampullary lesion is difficult to entirely exclude, particularly if there are elevated liver function tests. 3. Colonic diverticulosis without findings to suggest acute diverticulitis at this time. 4. Interval placement of Foley catheter in the urinary bladder which appears properly located. 5. Additional incidental findings, as above, similar to the recent prior examination.   Electronically Signed   By: Vinnie Langton M.D.   On: 04/10/2014 20:04   Dg Chest Port 1 View  04/12/2014   CLINICAL DATA:  Effusions, breast cancer.  EXAM: PORTABLE CHEST - 1 VIEW  COMPARISON:  04/11/2014 chest CT  FINDINGS: Known right suprahilar nodule and mediastinal/right hilar lymphadenopathy, better characterized on recent CT. Bibasilar airspace opacities and small right greater than left pleural effusion, increased in the interval. Interstitial prominence. Left chest wall Port-A-Cath with tip projecting over the mid SVC. No interval osseous change.  IMPRESSION: Small right greater than left pleural effusions, increased in the interval.  Bibasilar opacities; atelectasis (favored) versus infiltrate.  Interstitial prominence in part reflects COPD. Superimposed interstitial edema not excluded.  Known right upper lobe mass and adenopathy, better characterized on recent CT.   Electronically Signed   By: Carlos Levering M.D.   On: 04/12/2014 06:35   Dg Chest Port 1 View  04/10/2014   CLINICAL DATA:  Generalized weakness, shortness of breath, fever, and confusion, history stage IV breast cancer  EXAM: PORTABLE CHEST - 1 VIEW  COMPARISON:  Portable exam 1533 hr compared to 04/03/2014  FINDINGS: RIGHT jugular Port-A-Cath with tip projecting over SVC near cavoatrial junction.  Borderline enlargement of cardiac silhouette.  Atherosclerotic calcification aorta.  Mediastinal contours  and pulmonary vascularity normal.  Accentuated interstitial markings in part at least due to differences in technique.  Bibasilar atelectasis versus infiltrates, increased.  Upper lungs clear.  Questionable tiny LEFT  pleural effusion.  No pneumothorax.  Bones demineralized.  IMPRESSION: Bibasilar atelectasis versus infiltrates.   Electronically Signed   By: Lavonia Dana M.D.   On: 04/10/2014 16:00    Scheduled Meds: . antiseptic oral rinse  7 mL Mouth Rinse q12n4p  . ceFEPime (MAXIPIME) IV  1 g Intravenous Q8H  . chlorhexidine  15 mL Mouth Rinse BID  . ipratropium  0.5 mg Nebulization Q4H  . levalbuterol  0.63 mg Nebulization Q4H  . metronidazole  500 mg Intravenous Q8H  . pantoprazole (PROTONIX) IV  40 mg Intravenous Daily  . potassium chloride  10 mEq Intravenous Q1 Hr x 2  . vancomycin  500 mg Intravenous Q12H   Continuous Infusions:    Principal Problem:   Sepsis Active Problems:   COPD (chronic obstructive pulmonary disease)   Breast cancer metastasized to bone   Hypertension   Septic shock   Acute on chronic respiratory failure    Time spent: 30 min    [name] Doland, [facility] Pager [phone]. If 7PM-7AM, please contact night-coverage at [email or web address], password [facility] 04/12/2014, 8:28 AM  LOS: 2 days

## 2014-04-12 NOTE — Progress Notes (Signed)
ANTIBIOTIC CONSULT NOTE - FOLLOW UP  Pharmacy Consult for Vancomycin, Cefepime Indication: sepsis, HCAP  Assessment: 58yo F with stage IV breast cancer on day #3 vancomycin 1g x 1 then 500 mg IV q12h and cefepime 1g IV q8h for r/o sepsis likely 2/2 HCAP.  Flagyl 500 mg IV q8h added yesterday.  See pharmacist note from earlier today for further detail.  Vancomycin trough subtherapeutic at 8.5  Goal of Therapy:  Vancomycin trough level 15-20 mcg/ml  Plan:  Increase vancomycin to 1g IV q12h.  RN has already administered the 500 mg dose for this evening so will order an additional 500 mg dose to be given now to make 1g dose.  Hershal Coria, PharmD, BCPS Pager: [phone] 04/12/2014 6:42 PM

## 2014-04-12 NOTE — Progress Notes (Signed)
PULMONARY / CRITICAL CARE MEDICINE   Name: [name] MRN: [id number] DOB: February 28, 1956    ADMISSION DATE:  04/10/2014 CONSULTATION DATE:  04/11/2014  REFERRING MD :  Dr. Sheran Fava  CHIEF COMPLAINT:  Fever  INITIAL PRESENTATION:  58 yo female smoker sent to ER from [facility] with weakness, fever, chest pain, productive cough and confusion.  She has hx of metastatic breast cancer on chemo (eribulin), COPD.  Developed progressive fever, tachycardia, dyspnea and PCCM consulted to assist with management.  STUDIES:  10/26 CT head >> Lt frontal parietal metastatic lesion with mild edema 10/26 CT abd/pelvis >> ATX b/l, CBD 15 mm, atrophic pancreas, extensive atherosclerosis, innumerable metastatic bone lesions 10/27 CT chest >> small b/l effusions with ATX, 1.9 cm Rt suprahilar mass, no PE  SIGNIFICANT EVENTS: 10/26 Admit  HISTORY OF PRESENT ILLNESS:   58 yo female smoker sent to ER from [facility] with weakness, fever, chest pain, productive cough and confusion.  She has hx of metastatic breast cancer on chemo (eribulin), COPD.  Developed progressive fever, tachycardia, dyspnea and PCCM consulted to assist with management.  SUBJECTIVE:  Awake and alert, NAD at rest. VITAL SIGNS: Temp:  [96.8 F (36 C)-102.9 F (39.4 C)] 98.2 F (36.8 C) (10/28 0900) Pulse Rate:  [94-132] 103 (10/28 0900) Resp:  [14-44] 25 (10/28 0900) BP: (86-126)/(50-74) 126/70 mmHg (10/28 0900) SpO2:  [51 %-100 %] 93 % (10/28 0900) Weight:  [115 lb 8.3 oz (52.4 kg)] 115 lb 8.3 oz (52.4 kg) (10/28 0400) HEMODYNAMICS:   VENTILATOR SETTINGS:   INTAKE / OUTPUT:  Intake/Output Summary (Last 24 hours) at 04/12/14 1019 Last data filed at 04/12/14 0900  Gross per 24 hour  Intake 1605.83 ml  Output   2665 ml  Net -1059.17 ml    PHYSICAL EXAMINATION: General:  Chronically ill appearing female, NAD, co pain at site of rt porta cath Neuro:  Alert and interactive, following all commands. HEENT:  Neg  LAN/JVD Cardiovascular:  RRR, Nl S1/S2, -M/R/G. Lungs:  Diminished bs no wheezes Abdomen:  Soft, NT, ND and +BS. Musculoskeletal:  -edema and -tenderness. Skin: Intact.  LABS:  CBC  Recent Labs Lab 04/11/14 0435 04/11/14 1750 04/12/14 0501  WBC 13.0* 13.5* 14.4*  HGB 8.4* 8.8* 8.1*  HCT 25.8* 27.1* 24.2*  PLT 180 211 187   Coag's No results found for this basename: APTT, INR,  in the last 168 hours BMET  Recent Labs Lab 04/11/14 0435 04/11/14 1750 04/12/14 0501  NA 133* 132* 140  K 3.9 3.7 3.6*  CL 100 99 104  CO2 18* 16* 19  BUN 16 13 13   CREATININE 0.91 0.80 0.75  GLUCOSE 73 90 176*   Electrolytes  Recent Labs Lab 04/11/14 0435 04/11/14 1750 04/12/14 0501  CALCIUM 9.9 10.2 10.1  MG  --   --  2.0  PHOS  --   --  6.2*   Sepsis Markers  Recent Labs Lab 04/10/14 1553 04/10/14 1839 04/11/14 1750 04/11/14 1847 04/12/14 0501  LATICACIDVEN 1.90 0.82 1.2  --   --   PROCALCITON  --   --   --  4.83 4.01   ABG  Recent Labs Lab 04/11/14 1750  PHART 7.342*  PCO2ART 31.5*  PO2ART 76.0*   Liver Enzymes  Recent Labs Lab 04/10/14 1537 04/11/14 0435 04/11/14 1750  AST 87* 57* 66*  ALT 15 11 11   ALKPHOS 108 85 92  BILITOT 0.4 0.2* 0.3  ALBUMIN 3.4* 2.5* 2.5*   Cardiac Enzymes  Recent Labs Lab 04/10/14 1537 04/10/14 2300  TROPONINI <0.30 <0.30  PROBNP 298.3*  --    Glucose No results found for this basename: GLUCAP,  in the last 168 hours   Lab Results  Component Value Date   TSH 5.360* 04/10/2014     Imaging Ct Angio Chest Pe W/cm &/or Wo Cm  04/11/2014   CLINICAL DATA:  Chest pain.  Current history of breast cancer.  EXAM: CT ANGIOGRAPHY CHEST WITH CONTRAST  TECHNIQUE: Multidetector CT imaging of the chest was performed using the standard protocol during bolus administration of intravenous contrast. Multiplanar CT image reconstructions and MIPs were obtained to evaluate the vascular anatomy.  CONTRAST:  151mL OMNIPAQUE IOHEXOL  350 MG/ML SOLN  COMPARISON:  CT scan of April 03, 2014.  FINDINGS: Minimal bilateral pleural effusions are noted with adjacent subsegmental atelectasis. No pneumothorax is noted. 19 x 16 mm right suprahilar mass is again noted consistent with malignancy or metastatic disease. There is no evidence of pulmonary embolus. There is no evidence of thoracic aortic dissection or aneurysm. Stable prevascular, sub carinal, right hilar and right peritracheal adenopathy consistent with metastatic disease. Left subclavian Port-A-Cath is noted with distal tip at the cavoatrial junction. Visualized portion of upper abdomen appears normal. Stable right breast mass.  Review of the MIP images confirms the above findings.  IMPRESSION: There is no evidence of pulmonary embolus.  Minimal bilateral pleural effusions are noted with adjacent subsegmental atelectasis.  Stable mediastinal adenopathy and right suprahilar mass is noted consistent with metastatic disease.   Electronically Signed   By: Sabino Dick M.D.   On: 04/11/2014 10:42     ASSESSMENT / PLAN:  PULMONARY A: Acute respiratory distress in setting of sepsis, improved Reported hx of COPD with continue tobacco abuse. Small B effusions P:   Continue scheduled BD's with ipratropium/xopenex F/u CXR Oxygen to keep SpO2 > 92% BiPAP prn > suspect she will no longer require Bronchial hygiene Do not believe is a COPD exacerbation, will defer steroids at this time.   CARDIOVASCULAR Lt port >> A:  Severe sepsis. P:  Continue IV fluids Follow lactic acid, 1.2 on 10/27 Monitor hemodynamics > improved  RENAL A:   Metabolic acidosis. Resolving 10/28 with IVF P:   F/u BMET, optimize hemodynamics IVF as needed Monitor renal fx, urine outpt Replace electrolytes as indicated.  GASTROINTESTINAL A:   Nutrition.  P:   Feeding per [facility] for SUP  HEMATOLOGIC A:   Hx of metastatic breast cancer. Anemia of critical illness and chronic  disease. P:  F/u CBC Lovenox for DVT prevention  INFECTIOUS A:   Severe sepsis ?source. [place] 4.01 P:   Day 3 of Abx, currently on cefepime, flagyl and vancomycin  Blood 10/26 >> Urine 10/26 >> negative Blood 10/27 >>  ENDOCRINE A:   Mild elevation in TSH from 10/26. P:   Monitor clinically  NEUROLOGIC A:   Acute encephalopathy 2nd to sepsis. (resolved) P:   Monitor mental status  FAMILY    TODAY'S SUMMARY: Improved after 24 hours in the ICU with IVF and Abx. No acute respiratory issue currently.   [name] Minor ACNP Maryanna Shape PCCM Pager [phone] till 3 pm If no answer page [phone] 04/12/2014, 10:26 AM    Baltazar Apo, MD, PhD 04/12/2014, 10:44 AM [facility] [phone] or if no answer [phone]

## 2014-04-12 NOTE — Progress Notes (Signed)
[facility] ADULT ICU REPLACEMENT PROTOCOL FOR AM LAB REPLACEMENT ONLY  The patient does apply for the [facility] Adult ICU Electrolyte Replacment Protocol based on the criteria listed below:   1. Is GFR >/= 40 ml/min? Yes.    Patient's GFR today is >90 2. Is urine output >/= 0.5 ml/kg/hr for the last 6 hours? Yes.   Patient's UOP is 1.8 ml/kg/hr 3. Is BUN < 60 mg/dL? Yes.    Patient's BUN today is 13 4. Abnormal electrolyte(s):Potassium 5. Ordered repletion with: Potassium per Protocol  [name] P 04/12/2014 6:29 AM

## 2014-04-12 NOTE — Progress Notes (Signed)
ANTIBIOTIC CONSULT NOTE - FOLLOW UP  Pharmacy Consult for Vancomycin, Cefepime Indication: sepsis, HCAP  Allergies  Allergen Reactions  . Penicillins Other (See Comments)    Reaction a long time ago    Patient Measurements: Height: 4\' 11"  (149.9 cm) Weight: 115 lb 8.3 oz (52.4 kg) IBW/kg (Calculated) : 43.2  Vital Signs: Temp: 98.2 F (36.8 C) (10/28 1000) Temp Source: Core (Comment) (10/28 0800) BP: 118/89 mmHg (10/28 1000) Pulse Rate: 122 (10/28 1000) Intake/Output from previous day: 10/27 0701 - 10/28 0700 In: 1850 [I.V.:1300; IV Piggyback:550] Out: 2505 [Urine:2505]  Labs:  Recent Labs  04/11/14 0435 04/11/14 1750 04/12/14 0501  WBC 13.0* 13.5* 14.4*  HGB 8.4* 8.8* 8.1*  PLT 180 211 187  CREATININE 0.91 0.80 0.75   Estimated Creatinine Clearance: 56.1 ml/min (by C-G formula based on Cr of 0.75). No results found for this basename: Letta Median, VANCORANDOM, GENTTROUGH, GENTPEAK, GENTRANDOM, TOBRATROUGH, TOBRAPEAK, TOBRARND, AMIKACINPEAK, AMIKACINTROU, AMIKACIN,  in the last 72 hours    Assessment: 58yo F sent over from [facility] on 10/26 with fever, weakness, and AMS.  PMH includes COPD, HTN, and stage IV breast cancer.  She receives chemo at [facility], last given on 03/27/14; her chemo 10/26 was held and she was transferred to ED.  CXR shows bilateral atelectasis vs infiltrates.  Pharmacy is consulted to dose vancomycin and cefepime for r/o sepsis.  10/26 >> Vanc >> 10/26 >> Aztreonam >> 10/27 10/26 >> Levaquin >> 10/27 10/27 >> Cefepime >>  10/27 >> Metronidazole >>  Today, 10/28  Tmax: 102.9  WBCs: increased to 14.4 (not neutropenic, ANC 11.5 on 10/27)  Renal: SCr 0.75, CrCl 56 ml/min  Lactic acid: 1.2 (10/27)  PCT: 4.83 > 4.01 (10/28)  Blood and urine cultures: no growth to date  Goal of Therapy:  Vancomycin trough level 15-20 mcg/ml  Plan:   Continue Cefepime 1g IV q8h  Continue Vancomycin 500 mg IV q12h.  Measure Vanc trough at  steady state.  Follow up renal fxn and culture results.  Gretta Arab PharmD, BCPS Pager [phone] 04/12/2014 11:38 AM

## 2014-04-13 ENCOUNTER — Inpatient Hospital Stay (HOSPITAL_COMMUNITY): Payer: Medicaid Other

## 2014-04-13 ENCOUNTER — Other Ambulatory Visit: Payer: Self-pay

## 2014-04-13 LAB — PROCALCITONIN: Procalcitonin: 3.2 ng/mL

## 2014-04-13 LAB — CBC
HCT: 24.4 % — ABNORMAL LOW (ref 36.0–46.0)
Hemoglobin: 8.1 g/dL — ABNORMAL LOW (ref 12.0–15.0)
MCH: 30.2 pg (ref 26.0–34.0)
MCHC: 33.2 g/dL (ref 30.0–36.0)
MCV: 91 fL (ref 78.0–100.0)
Platelets: 191 10*3/uL (ref 150–400)
RBC: 2.68 MIL/uL — AB (ref 3.87–5.11)
RDW: 19.8 % — ABNORMAL HIGH (ref 11.5–15.5)
WBC: 19.1 10*3/uL — ABNORMAL HIGH (ref 4.0–10.5)

## 2014-04-13 LAB — BASIC METABOLIC PANEL
Anion gap: 15 (ref 5–15)
BUN: 17 mg/dL (ref 6–23)
CALCIUM: 9.6 mg/dL (ref 8.4–10.5)
CO2: 20 mEq/L (ref 19–32)
CREATININE: 0.75 mg/dL (ref 0.50–1.10)
Chloride: 103 mEq/L (ref 96–112)
GFR calc non Af Amer: >90 mL/min (ref >90)
GLUCOSE: 137 mg/dL — AB (ref 70–99)
POTASSIUM: 3.2 meq/L — AB (ref 3.7–5.3)
Sodium: 138 mEq/L (ref 137–147)

## 2014-04-13 LAB — TROPONIN I
Troponin I: <0.3 ng/mL (ref <0.30)
Troponin I: <0.3 ng/mL (ref <0.30)

## 2014-04-13 MED ORDER — OXYCODONE-ACETAMINOPHEN 5-325 MG PO TABS
1.0000 | ORAL_TABLET | Freq: Four times a day (QID) | ORAL | Status: DC | PRN
  Administered 2014-04-13: 2 via ORAL
  Administered 2014-04-13: 1 via ORAL
  Administered 2014-04-16 – 2014-04-17 (×2): 2 via ORAL
  Administered 2014-04-17 – 2014-04-18 (×4): 1 via ORAL
  Administered 2014-04-19: 2 via ORAL
  Administered 2014-04-19: 1 via ORAL
  Filled 2014-04-13: qty 1
  Filled 2014-04-13 (×2): qty 2
  Filled 2014-04-13 (×6): qty 1
  Filled 2014-04-13: qty 2
  Filled 2014-04-13: qty 1

## 2014-04-13 MED ORDER — BISACODYL 10 MG RE SUPP
10.0000 mg | Freq: Once | RECTAL | Status: AC
  Administered 2014-04-13: 10 mg via RECTAL
  Filled 2014-04-13: qty 1

## 2014-04-13 MED ORDER — BUDESONIDE 0.25 MG/2ML IN SUSP: 0.2500 mg | Freq: Four times a day (QID) | RESPIRATORY_TRACT | Status: DC

## 2014-04-13 MED ORDER — POLYETHYLENE GLYCOL 3350 17 G PO PACK
17.0000 g | PACK | Freq: Two times a day (BID) | ORAL | Status: DC
  Administered 2014-04-13 – 2014-04-19 (×11): 17 g via ORAL
  Filled 2014-04-13 (×12): qty 1

## 2014-04-13 MED ORDER — FUROSEMIDE 10 MG/ML IJ SOLN
20.0000 mg | Freq: Once | INTRAMUSCULAR | Status: AC
  Administered 2014-04-13: 20 mg via INTRAVENOUS
  Filled 2014-04-13: qty 2

## 2014-04-13 MED ORDER — MORPHINE SULFATE 2 MG/ML IJ SOLN
INTRAMUSCULAR | Status: AC
  Filled 2014-04-13: qty 1

## 2014-04-13 MED ORDER — LEVALBUTEROL HCL 0.63 MG/3ML IN NEBU
0.6300 mg | INHALATION_SOLUTION | Freq: Four times a day (QID) | RESPIRATORY_TRACT | Status: DC
  Administered 2014-04-13 – 2014-04-15 (×7): 0.63 mg via RESPIRATORY_TRACT
  Filled 2014-04-13 (×8): qty 3

## 2014-04-13 MED ORDER — LORAZEPAM 0.5 MG PO TABS
0.5000 mg | ORAL_TABLET | Freq: Every evening | ORAL | Status: DC | PRN
  Administered 2014-04-13 – 2014-04-17 (×2): 0.5 mg via ORAL
  Filled 2014-04-13 (×2): qty 1

## 2014-04-13 MED ORDER — MORPHINE SULFATE 2 MG/ML IJ SOLN
1.0000 mg | INTRAMUSCULAR | Status: DC | PRN
  Administered 2014-04-13 – 2014-04-16 (×6): 2 mg via INTRAVENOUS
  Filled 2014-04-13 (×6): qty 1

## 2014-04-13 MED ORDER — CYCLOBENZAPRINE HCL 5 MG PO TABS
5.0000 mg | ORAL_TABLET | Freq: Three times a day (TID) | ORAL | Status: DC | PRN
  Administered 2014-04-13 – 2014-04-19 (×4): 5 mg via ORAL
  Filled 2014-04-13 (×7): qty 1

## 2014-04-13 MED ORDER — IPRATROPIUM BROMIDE 0.02 % IN SOLN
0.5000 mg | Freq: Four times a day (QID) | RESPIRATORY_TRACT | Status: DC
  Administered 2014-04-13 – 2014-04-15 (×7): 0.5 mg via RESPIRATORY_TRACT
  Filled 2014-04-13 (×8): qty 2.5

## 2014-04-13 MED ORDER — SENNOSIDES-DOCUSATE SODIUM 8.6-50 MG PO TABS
1.0000 | ORAL_TABLET | Freq: Two times a day (BID) | ORAL | Status: DC
  Administered 2014-04-13 – 2014-04-15 (×5): 1 via ORAL
  Filled 2014-04-13 (×5): qty 1

## 2014-04-13 MED ORDER — MORPHINE SULFATE 2 MG/ML IJ SOLN
1.0000 mg | INTRAMUSCULAR | Status: DC | PRN
  Administered 2014-04-13 (×2): 1 mg via INTRAVENOUS
  Filled 2014-04-13: qty 1

## 2014-04-13 MED ORDER — BUDESONIDE 0.25 MG/2ML IN SUSP
0.2500 mg | Freq: Two times a day (BID) | RESPIRATORY_TRACT | Status: DC
  Administered 2014-04-13 – 2014-04-15 (×4): 0.25 mg via RESPIRATORY_TRACT
  Filled 2014-04-13 (×4): qty 2

## 2014-04-13 MED ORDER — LEVALBUTEROL HCL 0.63 MG/3ML IN NEBU: 0.6300 mg | INHALATION_SOLUTION | RESPIRATORY_TRACT | Status: DC | PRN

## 2014-04-13 MED ORDER — POTASSIUM CHLORIDE CRYS ER 20 MEQ PO TBCR
30.0000 meq | EXTENDED_RELEASE_TABLET | ORAL | Status: AC
  Administered 2014-04-13 (×2): 30 meq via ORAL
  Filled 2014-04-13 (×4): qty 1

## 2014-04-13 NOTE — Progress Notes (Signed)
PULMONARY / CRITICAL CARE MEDICINE   Name: [name] MRN: [id number] DOB: 09-15-1955    ADMISSION DATE:  04/10/2014 CONSULTATION DATE:  04/11/2014  REFERRING MD :  Dr. Sheran Fava  CHIEF COMPLAINT:  Fever  INITIAL PRESENTATION:  58 yo female smoker sent to ER from [place] [facility] with weakness, fever, chest pain, productive cough and confusion.  She has hx of metastatic breast cancer on chemo (eribulin), COPD.  Developed progressive fever, tachycardia, dyspnea and PCCM consulted to assist with management.  STUDIES:  10/26 CT head >> Lt frontal parietal metastatic lesion with mild edema 10/26 CT abd/pelvis >> ATX b/l, CBD 15 mm, atrophic pancreas, extensive atherosclerosis, innumerable metastatic bone lesions 10/27 CT chest >> small b/l effusions with ATX, 1.9 cm Rt suprahilar mass, no PE  SIGNIFICANT EVENTS: 10/26 Admit 10/29 resp distress with activity    HISTORY OF PRESENT ILLNESS:   58 yo female smoker sent to ER from [place] center with weakness, fever, chest pain, productive cough and confusion.  She has hx of metastatic breast cancer on chemo (eribulin), COPD.  Developed progressive fever, tachycardia, dyspnea and PCCM consulted to assist with management.  SUBJECTIVE:  Awake and alert, increased wob and air hunger with activity. VITAL SIGNS: Temp:  [97.5 F (36.4 C)-99.3 F (37.4 C)] 99.1 F (37.3 C) (10/29 0900) Pulse Rate:  [60-125] 97 (10/29 0900) Resp:  [17-39] 30 (10/29 0900) BP: (110-138)/(45-78) 133/73 mmHg (10/29 0900) SpO2:  [87 %-100 %] 100 % (10/29 0900) FiO2 (%):  [40 %] 40 % (10/29 0858) HEMODYNAMICS:   VENTILATOR SETTINGS: Vent Mode:  [-]  FiO2 (%):  [40 %] 40 % INTAKE / OUTPUT:  Intake/Output Summary (Last 24 hours) at 04/13/14 1034 Last data filed at 04/13/14 0800  Gross per 24 hour  Intake   1850 ml  Output   1875 ml  Net    -25 ml    PHYSICAL EXAMINATION: General:  Chronically ill appearing female, Increased wob Neuro:  Alert and  interactive, following all commands. HEENT:  Neg LAN/JVD Cardiovascular:  RRR, Nl S1/S2, -M/R/G. Lungs: Tight with increased wob, + wheeze Abdomen:  Soft, NT, ND and +BS. Musculoskeletal:  -edema and -tenderness. Skin: Intact.  LABS:  CBC  Recent Labs Lab 04/11/14 1750 04/12/14 0501 04/13/14 0545  WBC 13.5* 14.4* 19.1*  HGB 8.8* 8.1* 8.1*  HCT 27.1* 24.2* 24.4*  PLT 211 187 191   Coag's No results found for this basename: APTT, INR,  in the last 168 hours BMET  Recent Labs Lab 04/11/14 1750 04/12/14 0501 04/13/14 0545  NA 132* 140 138  K 3.7 3.6* 3.2*  CL 99 104 103  CO2 16* 19 20  BUN 13 13 17   CREATININE 0.80 0.75 0.75  GLUCOSE 90 176* 137*   Electrolytes  Recent Labs Lab 04/11/14 1750 04/12/14 0501 04/13/14 0545  CALCIUM 10.2 10.1 9.6  MG  --  2.0  --   PHOS  --  6.2*  --    Sepsis Markers  Recent Labs Lab 04/10/14 1553 04/10/14 1839 04/11/14 1750 04/11/14 1847 04/12/14 0501 04/13/14 0545  LATICACIDVEN 1.90 0.82 1.2  --   --   --   PROCALCITON  --   --   --  4.83 4.01 3.20   ABG  Recent Labs Lab 04/11/14 1750  PHART 7.342*  PCO2ART 31.5*  PO2ART 76.0*   Liver Enzymes  Recent Labs Lab 04/10/14 1537 04/11/14 0435 04/11/14 1750  AST 87* 57* 66*  ALT 15 11 11  ALKPHOS 108 85 92  BILITOT 0.4 0.2* 0.3  ALBUMIN 3.4* 2.5* 2.5*   Cardiac Enzymes  Recent Labs Lab 04/10/14 1537 04/10/14 2300  TROPONINI <0.30 <0.30  PROBNP 298.3*  --    Glucose No results found for this basename: GLUCAP,  in the last 168 hours   Lab Results  Component Value Date   TSH 5.360* 04/10/2014     Imaging Dg Chest Port 1 View  04/12/2014   CLINICAL DATA:  Effusions, breast cancer.  EXAM: PORTABLE CHEST - 1 VIEW  COMPARISON:  04/11/2014 chest CT  FINDINGS: Known right suprahilar nodule and mediastinal/right hilar lymphadenopathy, better characterized on recent CT. Bibasilar airspace opacities and small right greater than left pleural effusion,  increased in the interval. Interstitial prominence. Left chest wall Port-A-Cath with tip projecting over the mid SVC. No interval osseous change.  IMPRESSION: Small right greater than left pleural effusions, increased in the interval.  Bibasilar opacities; atelectasis (favored) versus infiltrate.  Interstitial prominence in part reflects COPD. Superimposed interstitial edema not excluded.  Known right upper lobe mass and adenopathy, better characterized on recent CT.   Electronically Signed   By: Carlos Levering M.D.   On: 04/12/2014 06:35     ASSESSMENT / PLAN:  PULMONARY A: Acute respiratory distress in setting of sepsis, improved Reported hx of COPD with continue tobacco abuse. Suspect her COPD is worse that first thought. Small B effusions P:   Continue scheduled BD's with ipratropium/xopenex, add pulmicort bid 10/29 F/u CXR Oxygen to keep SpO2 > 90% BiPAP prn > suspect she will no longer require Bronchial hygiene  CARDIOVASCULAR Lt port >> A:  Severe sepsis. 10/29 resolved P:  KVO IVF Monitor hemodynamics > improved. Per triad  RENAL A:   Metabolic acidosis. Resolving 10/28 with IVF P:   F/u BMET, optimize hemodynamics IVF as needed Monitor renal fx, urine outpt Replace electrolytes as indicated. Per triad.  HEMATOLOGIC A:   Hx of metastatic breast cancer. Anemia of critical illness and chronic disease. P:  F/u CBC Lovenox for DVT prevention  INFECTIOUS A:   Severe sepsis ?source. Procal 4.01 P:   Day 4 of Abx, currently on cefepime, flagyl and vancomycin. Consider narrowing in absence positive cx data  Blood 10/26 >> Urine 10/26 >> negative Blood 10/27 >>  ENDOCRINE A:   Mild elevation in TSH from 10/26. P:   ? Role for levothyroxine here. In setting acute illness difficult to say, but both T3 and freeT4 are low/normal.   NEUROLOGIC A:   Acute encephalopathy 2nd to sepsis. (resolved) P:   Monitor mental status    TODAY'S SUMMARY:  COPD with  severe DOE, exertional hypoxemia. Her sepsis and metabolic disarray have improved.    [name] Minor ACNP Maryanna Shape PCCM Pager [phone] till 3 pm If no answer page [phone] 04/13/2014, 10:34 AM    Baltazar Apo, MD, PhD 04/13/2014, 10:34 AM [facility] [phone] or if no answer [phone]

## 2014-04-13 NOTE — Progress Notes (Signed)
ERN alert that pulse ox 88% on RA. RR 28 with HR 119  Camera exam revealed  Variable  0 Points 1 Point 2 Points Total  Heart rate per minute  <90 beats 90-109 beats >110 beats 2  Respiratory  Rate per minute < 18 breaths 19-30 breaths  >30 breaths 1  Restlessness; nonpurposeful movements None  occas slight movement Frequent movement 0  Paradoxical breathing pattern: None  Present 0  Accessory muscle use: rise in clavicle during inspiration None Slight rise Pronounced rise 0  Grunting at end-expiration: guttural sound None  Present 0  Nasal flaring: involuntary movement of nares None  Present 0  Look of fear None  Eyes wide 0  Overall total out of 16    3    Respiratory Distress Observation Scale Journal of Palliative Medicine Vol. 13, Number 3, 2010 Campbell et al.    A Mild respiratory Distress only  P O2 Empiric lasix 20mg  IV x 1     Dr. Brand Males, M.D., [facility].C.P Pulmonary and Critical Care Medicine Staff Physician [facility] Pager: [phone], If no answer or between  15:00h - 7:00h: call [phone]  04/13/2014 12:46 AM

## 2014-04-13 NOTE — Progress Notes (Signed)
[facility] PROGRESS NOTE  [name] NUU:[id number] DOB: June 13, 1956 DOA: 04/10/2014 PCP: Lorayne Marek, MD  Brief Summary  [name] is a 58 y.o. female history of metastatic breast cancer, COPD and hypertension was referred to the ER from the [facility] after patient was found to be weak and confused. Patient had gone to her regular chemotherapy and was found to be confused and weak.  In the ER, she was febrile and complaining of right lower chest pain, productive cough, and possible abdominal pain.  Chest x-ray demonstrated bilateral atelectasis versus infiltrates and CT abdomen and pelvis was unremarkable. Patient has been started on empiric antibiotics for possible sepsis and admitted to stepdown.  Assessment/Plan  Sepsis,  probably from pneumonia.   -  Continue vancomycin day 4 and cefepime and flagyl day 3.  -  CT angio negative for PE.  -  blood cultures no growth to date.  -  Legionella and strep pneumo ag negative.  -  Flu PCR negative -  Sputum culture if able -  UA neg -  CT abd/pelvis neg -WBC trending up, but afebrile. Would continue with same antibiotics regimen.   Acute Hypoxic respiratory failure; multifactorial: PNA, COPD, metastasis diseases.  -BIPAP PRN.  -Received IV lasix overnight 10-28.  -NSL fluids.  -CT angio negative for PE. Stable mediastinal adenopathy and right suprahilar mass is noted consistent with metastatic disease.  Constipation;  Check KUB.  Start miralax, docusate. Dulcolax suppository if KUB negative for obstruction.   Acute encephalopathy due to sepsis and high fever - will resume flexeril PRN, careful with oversedation.  -  Treat infection  Generalized weakness from infection and dehydration.  IVF and abx -  PT/OT consults  Metastatic breast cancer -  Dr. Jana Hakim on rounding list  Hypercalcemia likely secondary to dehydration and already improved with IVF  HTN with borderline low BPs -  Hold BP  medications  COPD;  -  Continue flovent -  duonebs with albuterol prn  Leukocytosis due to sepsis, continue with  abx  Anemia of chronic disease, hemoglobin trending down with IVF -  Transfuse for hgb < 7 or symptomatic anemia - Hb stable at 8.   Metabolic acidosis with mild gap likely due to ketosis and dehydration.  Lactic acid was normal.  Improving with IVF -  Continue IV hydration   Sick euthyroid -  Repeat TSH in 4-6 weeks  Diet:  regular Access:  port IVF:  yes Proph:  lovenox  Code Status: full Family Communication: patient alone Disposition Plan: continue stepdown for today   Consultants:  Oncology added to rounding list  Procedures:  10/26 CT head unremarkable  10/26 CT abd/pelvis neg  10/26 CXR bibasilar PNA vs. aletelectasis  Antibiotics:  Vancomycin 10/26 >>  Aztreonam 10/26 > 10/27  Levofloxacin 10/26   Cefepime 10/27 >>  HPI/Subjective: No BM since admission. Passing gas.  Mild flank , and right lower quadrant pain, since she was diagnosed with cancer.  Breathing ok. Feels anxious, at home she takes flexeril and ativan PRN.   Objective: Filed Vitals:   04/13/14 0436 04/13/14 0500 04/13/14 0600 04/13/14 0700  BP:  117/60 112/71 133/78  Pulse:  106 98 102  Temp:  98.4 F (36.9 C) 97.9 F (36.6 C) 98.6 F (37 C)  TempSrc:  Core (Comment) Core (Comment)   Resp:  21 19 17   Height:      Weight:      SpO2: 94% 94% 100% 97%    Intake/Output  Summary (Last 24 hours) at 04/13/14 0756 Last data filed at 04/13/14 0724  Gross per 24 hour  Intake 2055.83 ml  Output   2095 ml  Net -39.17 ml   Filed Weights   04/10/14 1522 04/12/14 0400  Weight: 52.617 kg (116 lb) 52.4 kg (115 lb 8.3 oz)    Exam:   General:  Thin BF, tachypneic.   Cardiovascular:  Tachycardic RR, nl S1, S2.   Respiratory:  CTAB, tachypneic.   Abdomen:   NABS, soft, mildly distended and nontender.   MSK:  No edema.   Neuro:  Alert and oriented.   Data  Reviewed: Basic Metabolic Panel:  Recent Labs Lab 04/10/14 1537 04/10/14 2300 04/11/14 0435 04/11/14 1750 04/12/14 0501 04/13/14 0545  NA 132*  --  133* 132* 140 138  K 4.0  --  3.9 3.7 3.6* 3.2*  CL 92*  --  100 99 104 103  CO2 17*  --  18* 16* 19 20  GLUCOSE 94  --  73 90 176* 137*  BUN 23  --  16 13 13 17   CREATININE 1.10 1.04 0.91 0.80 0.75 0.75  CALCIUM 12.2*  --  9.9 10.2 10.1 9.6  MG  --   --   --   --  2.0  --   PHOS  --   --   --   --  6.2*  --    Liver Function Tests:  Recent Labs Lab 04/10/14 1428 04/10/14 1537 04/11/14 0435 04/11/14 1750  AST 90* 87* 57* 66*  ALT 19 15 11 11   ALKPHOS 107 108 85 92  BILITOT 0.49 0.4 0.2* 0.3  PROT 7.9 7.9 6.2 6.6  ALBUMIN 3.2* 3.4* 2.5* 2.5*    Recent Labs Lab 04/10/14 1537  LIPASE 9*    Recent Labs Lab 04/10/14 1537  AMMONIA 44   CBC:  Recent Labs Lab 04/10/14 1427  04/10/14 1537 04/10/14 2300 04/11/14 0435 04/11/14 1750 04/12/14 0501 04/13/14 0545  WBC 16.7*  < > 16.4* 14.6* 13.0* 13.5* 14.4* 19.1*  NEUTROABS 13.6*  --  13.3*  --  10.8* 11.5*  --   --   HGB 11.2*  < > 10.7* 9.0* 8.4* 8.8* 8.1* 8.1*  HCT 33.7*  < > 32.0* 28.0* 25.8* 27.1* 24.2* 24.4*  MCV 92.3  < > 92.0 94.6 94.2 92.5 91.0 91.0  PLT 222  < > 227 188 180 211 187 191  < > = values in this interval not displayed. Cardiac Enzymes:  Recent Labs Lab 04/10/14 1537 04/10/14 2300  TROPONINI <0.30 <0.30   BNP (last 3 results)  Recent Labs  04/10/14 1537  PROBNP 298.3*   CBG: No results found for this basename: GLUCAP,  in the last 168 hours  Recent Results (from the past 240 hour(s))  URINE CULTURE     Status: None   Collection Time    04/03/14  9:44 PM      Result Value Ref Range Status   Specimen Description URINE, CLEAN CATCH   Final   Special Requests NONE   Final   Culture  Setup Time     Final   Value: 04/04/2014 01:52     Performed at [place]     Final   Value: NO GROWTH     Performed  at [facility]   Culture     Final   Value: NO GROWTH     Performed at [facility]  Report Status 04/04/2014 FINAL   Final  TECHNOLOGIST REVIEW     Status: None   Collection Time    04/10/14  2:27 PM      Result Value Ref Range Status   Technologist Review 2% Nrbcs, 3% metamyelocytes   Final  CULTURE, BLOOD (ROUTINE X 2)     Status: None   Collection Time    04/10/14  3:20 PM      Result Value Ref Range Status   Specimen Description BLOOD LEFT CHEST   Final   Special Requests BOTTLES DRAWN AEROBIC AND ANAEROBIC 5 CC EA   Final   Culture  Setup Time     Final   Value: 04/10/2014 21:17     Performed at [facility]   Culture     Final   Value:        BLOOD CULTURE RECEIVED NO GROWTH TO DATE CULTURE WILL BE HELD FOR 5 DAYS BEFORE ISSUING A FINAL NEGATIVE REPORT     Performed at [facility]   Report Status PENDING   Incomplete  CULTURE, BLOOD (ROUTINE X 2)     Status: None   Collection Time    04/10/14  3:25 PM      Result Value Ref Range Status   Specimen Description BLOOD LEFT ARM   Final   Special Requests BOTTLES DRAWN AEROBIC AND ANAEROBIC 8CC   Final   Culture  Setup Time     Final   Value: 04/11/2014 03:55     Performed at [facility]   Culture     Final   Value:        BLOOD CULTURE RECEIVED NO GROWTH TO DATE CULTURE WILL BE HELD FOR 5 DAYS BEFORE ISSUING A FINAL NEGATIVE REPORT     Performed at [facility]   Report Status PENDING   Incomplete  URINE CULTURE     Status: None   Collection Time    04/10/14  3:37 PM      Result Value Ref Range Status   Specimen Description URINE, CATHETERIZED   Final   Special Requests NONE   Final   Culture  Setup Time     Final   Value: 04/10/2014 21:31     Performed at [place]     Final   Value: NO GROWTH     Performed at [facility]   Culture     Final   Value: NO GROWTH     Performed at [facility]   Report Status 04/11/2014  FINAL   Final  MRSA PCR SCREENING     Status: None   Collection Time    04/10/14 10:20 PM      Result Value Ref Range Status   MRSA by PCR NEGATIVE  NEGATIVE Final   Comment:            The GeneXpert MRSA Assay (FDA     approved for NASAL specimens     only), is one component of a     comprehensive MRSA colonization     surveillance program. It is not     intended to diagnose MRSA     infection nor to guide or     monitor treatment for     MRSA infections.  CULTURE, BLOOD (ROUTINE X 2)     Status: None   Collection Time    04/11/14  5:50 PM      Result  Value Ref Range Status   Specimen Description BLOOD LEFT ARM   Final   Special Requests BOTTLES DRAWN AEROBIC AND ANAEROBIC  10 CC   Final   Culture  Setup Time     Final   Value: 04/11/2014 22:33     Performed at [facility]   Culture     Final   Value:        BLOOD CULTURE RECEIVED NO GROWTH TO DATE CULTURE WILL BE HELD FOR 5 DAYS BEFORE ISSUING A FINAL NEGATIVE REPORT     Performed at [facility]   Report Status PENDING   Incomplete  CULTURE, BLOOD (ROUTINE X 2)     Status: None   Collection Time    04/11/14  5:59 PM      Result Value Ref Range Status   Specimen Description BLOOD LEFT ARM   Final   Special Requests BOTTLES DRAWN AEROBIC AND ANAEROBIC  10 CC   Final   Culture  Setup Time     Final   Value: 04/11/2014 22:33     Performed at [facility]   Culture     Final   Value:        BLOOD CULTURE RECEIVED NO GROWTH TO DATE CULTURE WILL BE HELD FOR 5 DAYS BEFORE ISSUING A FINAL NEGATIVE REPORT     Performed at [facility]   Report Status PENDING   Incomplete     Studies: Ct Angio Chest Pe W/cm &/or Wo Cm  04/11/2014   CLINICAL DATA:  Chest pain.  Current history of breast cancer.  EXAM: CT ANGIOGRAPHY CHEST WITH CONTRAST  TECHNIQUE: Multidetector CT imaging of the chest was performed using the standard protocol during bolus administration of intravenous contrast. Multiplanar CT  image reconstructions and MIPs were obtained to evaluate the vascular anatomy.  CONTRAST:  158mL OMNIPAQUE IOHEXOL 350 MG/ML SOLN  COMPARISON:  CT scan of April 03, 2014.  FINDINGS: Minimal bilateral pleural effusions are noted with adjacent subsegmental atelectasis. No pneumothorax is noted. 19 x 16 mm right suprahilar mass is again noted consistent with malignancy or metastatic disease. There is no evidence of pulmonary embolus. There is no evidence of thoracic aortic dissection or aneurysm. Stable prevascular, sub carinal, right hilar and right peritracheal adenopathy consistent with metastatic disease. Left subclavian Port-A-Cath is noted with distal tip at the cavoatrial junction. Visualized portion of upper abdomen appears normal. Stable right breast mass.  Review of the MIP images confirms the above findings.  IMPRESSION: There is no evidence of pulmonary embolus.  Minimal bilateral pleural effusions are noted with adjacent subsegmental atelectasis.  Stable mediastinal adenopathy and right suprahilar mass is noted consistent with metastatic disease.   Electronically Signed   By: Sabino Dick M.D.   On: 04/11/2014 10:42   Dg Chest Port 1 View  04/12/2014   CLINICAL DATA:  Effusions, breast cancer.  EXAM: PORTABLE CHEST - 1 VIEW  COMPARISON:  04/11/2014 chest CT  FINDINGS: Known right suprahilar nodule and mediastinal/right hilar lymphadenopathy, better characterized on recent CT. Bibasilar airspace opacities and small right greater than left pleural effusion, increased in the interval. Interstitial prominence. Left chest wall Port-A-Cath with tip projecting over the mid SVC. No interval osseous change.  IMPRESSION: Small right greater than left pleural effusions, increased in the interval.  Bibasilar opacities; atelectasis (favored) versus infiltrate.  Interstitial prominence in part reflects COPD. Superimposed interstitial edema not excluded.  Known right upper lobe mass and adenopathy, better  characterized  on recent CT.   Electronically Signed   By: Carlos Levering M.D.   On: 04/12/2014 06:35    Scheduled Meds: . antiseptic oral rinse  7 mL Mouth Rinse q12n4p  . ceFEPime (MAXIPIME) IV  1 g Intravenous Q8H  . chlorhexidine  15 mL Mouth Rinse BID  . ipratropium  0.5 mg Nebulization Q4H  . levalbuterol  0.63 mg Nebulization Q4H  . metronidazole  500 mg Intravenous Q8H  . pantoprazole (PROTONIX) IV  40 mg Intravenous Daily  . potassium chloride  30 mEq Oral Q4H  . vancomycin  1,000 mg Intravenous Q12H   Continuous Infusions: . sodium chloride 50 mL/hr at 04/13/14 0600    Principal Problem:   Sepsis Active Problems:   COPD (chronic obstructive pulmonary disease)   Breast cancer metastasized to bone   Hypertension   Septic shock   Acute on chronic respiratory failure    Time spent: 30 min    [place], [place] [facility] Pager [phone]. If 7PM-7AM, please contact night-coverage at [email or web address], password [facility] 04/13/2014, 7:56 AM  LOS: 3 days

## 2014-04-13 NOTE — Progress Notes (Signed)
Pt complained of 7 out of 10 chest pain. MD made aware. EKG- Sinus Tachycardia with PVCs.  Awaiting more orders.  Will continue to monitor pt.

## 2014-04-13 NOTE — Progress Notes (Signed)
COURTESY NOTE:  Appreciate your care to Ms Lehigh. She is complaining of significnat pain and I have broadened her morphine dosage slightly. Her history is summarized below.  58 y.o. [place] woman with stage IV breast cancer involving brain, lung, and bones  (1) status post right breast biopsy of 2 separate masses in the right axillary lymph node 12/21/2013 showing a clinical T3 N3, stage IIIC invasive ductal carcinoma, triple negative, with an MIB-1 of 85%; staging studies 01/04/2014 show metastatic disease to the brain, lung, and bones.  (2) status post stereotactic radiosurgery to a 1.7 cm left parietal lesion: 20 Gy given 01/13/2014  (3) systemic therapy consists of neoadjuvant eribulin, given days 1 and 8 of each 21 day cycle, started 01/17/2014  (4) zolendronate to be started once the patient's dental condition has been optimized. I am arranging for her to see a dentist.  (5) right mastectomy for local control to be considered once systemic disease comes under good control  (6) also status post left lumpectomy and sentinel lymph node sampling 11/23/2007 for a pTis pN0, stage 0 ductal carcinoma in situ, high-grade, estrogen and progesterone receptor negative, status post adjuvant radiation  Will follow with you. Please let me know if I can be of further help.

## 2014-04-13 NOTE — Progress Notes (Signed)
[facility] ADULT ICU REPLACEMENT PROTOCOL FOR AM LAB REPLACEMENT ONLY  The patient does apply for the [facility] Adult ICU Electrolyte Replacment Protocol based on the criteria listed below:   1. Is GFR >/= 40 ml/min? Yes.    Patient's GFR today is >90 2. Is urine output >/= 0.5 ml/kg/hr for the last 6 hours? Yes.   Patient's UOP is 1.3 ml/kg/hr 3. Is BUN < 60 mg/dL? Yes.    Patient's BUN today is 17 4. Abnormal electrolyte(s):Potassium 5. Ordered repletion with: Potassium per protocol   Zebastian Carico P 04/13/2014 6:29 AM

## 2014-04-14 ENCOUNTER — Other Ambulatory Visit: Payer: Self-pay

## 2014-04-14 ENCOUNTER — Inpatient Hospital Stay (HOSPITAL_COMMUNITY): Payer: Medicaid Other

## 2014-04-14 LAB — CBC
HEMATOCRIT: 24.4 % — AB (ref 36.0–46.0)
Hemoglobin: 8.1 g/dL — ABNORMAL LOW (ref 12.0–15.0)
MCH: 30.7 pg (ref 26.0–34.0)
MCHC: 33.2 g/dL (ref 30.0–36.0)
MCV: 92.4 fL (ref 78.0–100.0)
Platelets: 173 10*3/uL (ref 150–400)
RBC: 2.64 MIL/uL — ABNORMAL LOW (ref 3.87–5.11)
RDW: 20.2 % — ABNORMAL HIGH (ref 11.5–15.5)
WBC: 17 10*3/uL — AB (ref 4.0–10.5)

## 2014-04-14 LAB — BASIC METABOLIC PANEL
ANION GAP: 16 — AB (ref 5–15)
Anion gap: 14 (ref 5–15)
BUN: 16 mg/dL (ref 6–23)
BUN: 13 mg/dL (ref 6–23)
CALCIUM: 9.6 mg/dL (ref 8.4–10.5)
CHLORIDE: 103 meq/L (ref 96–112)
CHLORIDE: 99 meq/L (ref 96–112)
CO2: 20 mEq/L (ref 19–32)
CO2: 22 mEq/L (ref 19–32)
CREATININE: 0.69 mg/dL (ref 0.50–1.10)
Calcium: 10.6 mg/dL — ABNORMAL HIGH (ref 8.4–10.5)
Creatinine, Ser: 0.66 mg/dL (ref 0.50–1.10)
GFR calc Af Amer: >90 mL/min (ref >90)
GFR calc Af Amer: >90 mL/min (ref >90)
GFR calc non Af Amer: >90 mL/min (ref >90)
GFR calc non Af Amer: >90 mL/min (ref >90)
GLUCOSE: 106 mg/dL — AB (ref 70–99)
Glucose, Bld: 104 mg/dL — ABNORMAL HIGH (ref 70–99)
Potassium: 4.4 mEq/L (ref 3.7–5.3)
Potassium: 4 mEq/L (ref 3.7–5.3)
Sodium: 137 mEq/L (ref 137–147)
Sodium: 137 mEq/L (ref 137–147)

## 2014-04-14 LAB — TROPONIN I

## 2014-04-14 LAB — MAGNESIUM: Magnesium: 1.7 mg/dL (ref 1.5–2.5)

## 2014-04-14 MED ORDER — BISACODYL 10 MG RE SUPP: 10.0000 mg | Freq: Every day | RECTAL | Status: DC | PRN

## 2014-04-14 MED ORDER — FUROSEMIDE 10 MG/ML IJ SOLN
40.0000 mg | Freq: Once | INTRAMUSCULAR | Status: AC
  Administered 2014-04-14: 40 mg via INTRAVENOUS
  Filled 2014-04-14: qty 4

## 2014-04-14 MED ORDER — METHYLPREDNISOLONE SODIUM SUCC 40 MG IJ SOLR: 40.0000 mg | Freq: Three times a day (TID) | INTRAMUSCULAR | Status: DC

## 2014-04-14 MED ORDER — FUROSEMIDE 10 MG/ML IJ SOLN
20.0000 mg | Freq: Once | INTRAMUSCULAR | Status: AC
  Administered 2014-04-14: 20 mg via INTRAVENOUS
  Filled 2014-04-14: qty 2

## 2014-04-14 MED ORDER — LORAZEPAM 2 MG/ML IJ SOLN
0.5000 mg | Freq: Once | INTRAMUSCULAR | Status: AC
  Administered 2014-04-14: 0.5 mg via INTRAVENOUS
  Filled 2014-04-14: qty 1

## 2014-04-14 MED ORDER — SODIUM CHLORIDE 0.9 % IV BOLUS (SEPSIS)
500.0000 mL | Freq: Once | INTRAVENOUS | Status: AC
  Administered 2014-04-14: 500 mL via INTRAVENOUS

## 2014-04-14 MED ORDER — POTASSIUM CHLORIDE CRYS ER 20 MEQ PO TBCR
20.0000 meq | EXTENDED_RELEASE_TABLET | Freq: Once | ORAL | Status: AC
  Administered 2014-04-14: 20 meq via ORAL
  Filled 2014-04-14: qty 1

## 2014-04-14 MED ORDER — METHYLPREDNISOLONE SODIUM SUCC 125 MG IJ SOLR
60.0000 mg | Freq: Four times a day (QID) | INTRAMUSCULAR | Status: DC
  Administered 2014-04-14 – 2014-04-15 (×4): 60 mg via INTRAVENOUS
  Filled 2014-04-14 (×3): qty 2

## 2014-04-14 NOTE — Progress Notes (Signed)
[place] Progress Note Patient Name: [name] DOB: June 29, 1955 MRN: [id number]   Date of Service  04/14/2014  HPI/Events of Note  Sinus tachy continues  eICU Interventions  Gave saline bolus     Intervention Category Major Interventions: Arrhythmia - evaluation and management  Trebor Galdamez 04/14/2014, 7:33 PM

## 2014-04-14 NOTE — Progress Notes (Signed)
[place] Progress Note Patient Name: [name] DOB: 1956-03-19 MRN: [id number]   Date of Service  04/14/2014  HPI/Events of Note  Tachycardia, asymptomatic, normal BP, looks OK on camera, per RN no distress  eICU Interventions  EKG Check BMET/Mg     Intervention Category Major Interventions: Arrhythmia - evaluation and management  MCQUAID, DOUGLAS 04/14/2014, 3:42 PM

## 2014-04-14 NOTE — Progress Notes (Addendum)
[facility] PROGRESS NOTE  [name] YCX:[id number] DOB: February 05, 1956 DOA: 04/10/2014 PCP: Lorayne Marek, MD  Brief Summary  [name] is a 58 y.o. female history of metastatic breast cancer, COPD and hypertension was referred to the ER from the [place] after patient was found to be weak and confused. Patient had gone to her regular chemotherapy and was found to be confused and weak.  In the ER, she was febrile and complaining of right lower chest pain, productive cough, and possible abdominal pain.  Chest x-ray demonstrated bilateral atelectasis versus infiltrates and CT abdomen and pelvis was unremarkable. Patient has been started on empiric antibiotics for possible sepsis and admitted to stepdown.  Assessment/Plan  Sepsis,  probably from pneumonia ? .   -  Continue vancomycin day 5 and cefepime and flagyl day 4.  -  CT angio negative for PE.  -  blood cultures no growth to date.  -  Legionella and strep pneumo ag negative.  -  Flu PCR negative -  Sputum culture if able -  UA neg - CT abd/pelvis neg 10-26.  -WBC decrease to 17 from 19. Spike fever this morning. Will ask CCM for thoracentesis ? Or do we need to repeat CT abdomen ?  -continue with same regimen of antibiotics.   Acute Hypoxic respiratory failure; multifactorial: PNA, COPD, metastasis diseases.  -BIPAP PRN.  -Received IV lasix overnight 10-28.  -NSL fluids.  -CT angio negative for PE. Stable mediastinal adenopathy and right suprahilar mass is noted consistent with metastatic disease. -increase oxygen requirement on 10-29.  -chest x ray with edema. Will give one time dose lasix.   Chest pain: resolved. Troponin negative. Started on protonix.   Constipation, Ileus.;  Continue with clear diet.  KUB 10-30. Mild gaseous distended small bowel loops probable ileus. Stool in right colon. Moderate colonic gaseous distension. Continue with miralax, docusate. Dulcolax suppository PRN.  Had very small BM  10-29.   Acute encephalopathy due to sepsis and high fever - will resume flexeril PRN, careful with oversedation.  -  Treat infection  Generalized weakness from infection and dehydration.  IVF and abx -  PT/OT consults  Metastatic breast cancer -  Dr. Jana Hakim on rounding list  Hypercalcemia likely secondary to dehydration and already improved with IVF  HTN with borderline low BPs -  Hold BP medications  COPD;  -  Continue flovent -  duonebs with albuterol prn  Leukocytosis due to sepsis, continue with  Abx.  Anemia of chronic disease, hemoglobin trending down with IVF -  Transfuse for hgb < 7 or symptomatic anemia - Hb stable at 8.   Metabolic acidosis with mild gap likely due to ketosis and dehydration.  Lactic acid was normal.  Improving with IVF Resolved.  Sick euthyroid -  Repeat TSH in 4-6 weeks  Diet:  regular Access:  port IVF:  yes Proph:  lovenox  Code Status: full Family Communication: patient alone Disposition Plan: continue stepdown for today   Consultants:  Oncology added to rounding list  Procedures:  10/26 CT head unremarkable  10/26 CT abd/pelvis neg  10/26 CXR bibasilar PNA vs. aletelectasis  Antibiotics:  Vancomycin 10/26 >>  Aztreonam 10/26 > 10/27  Levofloxacin 10/26   Cefepime 10/27 >>  HPI/Subjective: Had small BM yesterday. Relates jaw pain, generalized pain, abdominal pain. Unclear since when she has abdominal pain.  Feels better than yesterday.   Objective: Filed Vitals:   04/14/14 0400 04/14/14 0424 04/14/14 0500 04/14/14 0600  BP: 136/86  130/74 129/90  Pulse: 119  122 131  Temp: 100.9 F (38.3 C)  102.9 F (39.4 C) 103.6 F (39.8 C)  TempSrc: Core (Comment)  Core (Comment) Core (Comment)  Resp: 26  21 25   Height:      Weight:   53.9 kg (118 lb 13.3 oz)   SpO2: 99% 86% 91% 99%    Intake/Output Summary (Last 24 hours) at 04/14/14 0859 Last data filed at 04/14/14 0700  Gross per 24 hour  Intake    755 ml   Output   1355 ml  Net   -600 ml   Filed Weights   04/10/14 1522 04/12/14 0400 04/14/14 0500  Weight: 52.617 kg (116 lb) 52.4 kg (115 lb 8.3 oz) 53.9 kg (118 lb 13.3 oz)    Exam:   General:  Thin BF, tachypneic.   Cardiovascular:  Tachycardic RR, nl S1, S2.   Respiratory:  CTAB, tachypneic.   Abdomen:   NABS, soft, mildly distended and mild tenderness.   MSK:  No edema.   Neuro:  Alert and oriented.   Data Reviewed: Basic Metabolic Panel:  Recent Labs Lab 04/11/14 0435 04/11/14 1750 04/12/14 0501 04/13/14 0545 04/14/14 0135  NA 133* 132* 140 138 137  K 3.9 3.7 3.6* 3.2* 4.4  CL 100 99 104 103 103  CO2 18* 16* 19 20 20   GLUCOSE 73 90 176* 137* 106*  BUN 16 13 13 17 16   CREATININE 0.91 0.80 0.75 0.75 0.69  CALCIUM 9.9 10.2 10.1 9.6 9.6  MG  --   --  2.0  --   --   PHOS  --   --  6.2*  --   --    Liver Function Tests:  Recent Labs Lab 04/10/14 1428 04/10/14 1537 04/11/14 0435 04/11/14 1750  AST 90* 87* 57* 66*  ALT 19 15 11 11   ALKPHOS 107 108 85 92  BILITOT 0.49 0.4 0.2* 0.3  PROT 7.9 7.9 6.2 6.6  ALBUMIN 3.2* 3.4* 2.5* 2.5*    Recent Labs Lab 04/10/14 1537  LIPASE 9*    Recent Labs Lab 04/10/14 1537  AMMONIA 44   CBC:  Recent Labs Lab 04/10/14 1427 04/10/14 1537  04/11/14 0435 04/11/14 1750 04/12/14 0501 04/13/14 0545 04/14/14 0135  WBC 16.7* 16.4*  < > 13.0* 13.5* 14.4* 19.1* 17.0*  NEUTROABS 13.6* 13.3*  --  10.8* 11.5*  --   --   --   HGB 11.2* 10.7*  < > 8.4* 8.8* 8.1* 8.1* 8.1*  HCT 33.7* 32.0*  < > 25.8* 27.1* 24.2* 24.4* 24.4*  MCV 92.3 92.0  < > 94.2 92.5 91.0 91.0 92.4  PLT 222 227  < > 180 211 187 191 173  < > = values in this interval not displayed. Cardiac Enzymes:  Recent Labs Lab 04/10/14 1537 04/10/14 2300 04/13/14 1330 04/13/14 1910 04/14/14 0135  TROPONINI <0.30 <0.30 <0.30 <0.30 <0.30   BNP (last 3 results)  Recent Labs  04/10/14 1537  PROBNP 298.3*   CBG: No results found for this basename:  GLUCAP,  in the last 168 hours  Recent Results (from the past 240 hour(s))  TECHNOLOGIST REVIEW     Status: None   Collection Time    04/10/14  2:27 PM      Result Value Ref Range Status   Technologist Review 2% Nrbcs, 3% metamyelocytes   Final  CULTURE, BLOOD (ROUTINE X 2)     Status: None   Collection Time    04/10/14  3:20 PM      Result Value Ref Range Status   Specimen Description BLOOD LEFT CHEST   Final   Special Requests BOTTLES DRAWN AEROBIC AND ANAEROBIC 5 CC EA   Final   Culture  Setup Time     Final   Value: 04/10/2014 21:17     Performed at [facility]   Culture     Final   Value:        BLOOD CULTURE RECEIVED NO GROWTH TO DATE CULTURE WILL BE HELD FOR 5 DAYS BEFORE ISSUING A FINAL NEGATIVE REPORT     Performed at [facility]   Report Status PENDING   Incomplete  CULTURE, BLOOD (ROUTINE X 2)     Status: None   Collection Time    04/10/14  3:25 PM      Result Value Ref Range Status   Specimen Description BLOOD LEFT ARM   Final   Special Requests BOTTLES DRAWN AEROBIC AND ANAEROBIC 8CC   Final   Culture  Setup Time     Final   Value: 04/11/2014 03:55     Performed at [facility]   Culture     Final   Value:        BLOOD CULTURE RECEIVED NO GROWTH TO DATE CULTURE WILL BE HELD FOR 5 DAYS BEFORE ISSUING A FINAL NEGATIVE REPORT     Performed at [facility]   Report Status PENDING   Incomplete  URINE CULTURE     Status: None   Collection Time    04/10/14  3:37 PM      Result Value Ref Range Status   Specimen Description URINE, CATHETERIZED   Final   Special Requests NONE   Final   Culture  Setup Time     Final   Value: 04/10/2014 21:31     Performed at [place]     Final   Value: NO GROWTH     Performed at [facility]   Culture     Final   Value: NO GROWTH     Performed at [facility]   Report Status 04/11/2014 FINAL   Final  MRSA PCR SCREENING     Status: None   Collection Time     04/10/14 10:20 PM      Result Value Ref Range Status   MRSA by PCR NEGATIVE  NEGATIVE Final   Comment:            The GeneXpert MRSA Assay (FDA     approved for NASAL specimens     only), is one component of a     comprehensive MRSA colonization     surveillance program. It is not     intended to diagnose MRSA     infection nor to guide or     monitor treatment for     MRSA infections.  CULTURE, BLOOD (ROUTINE X 2)     Status: None   Collection Time    04/11/14  5:50 PM      Result Value Ref Range Status   Specimen Description BLOOD LEFT ARM   Final   Special Requests BOTTLES DRAWN AEROBIC AND ANAEROBIC  10 CC   Final   Culture  Setup Time     Final   Value: 04/11/2014 22:33     Performed at [facility]   Culture     Final   Value:  BLOOD CULTURE RECEIVED NO GROWTH TO DATE CULTURE WILL BE HELD FOR 5 DAYS BEFORE ISSUING A FINAL NEGATIVE REPORT     Performed at [facility]   Report Status PENDING   Incomplete  CULTURE, BLOOD (ROUTINE X 2)     Status: None   Collection Time    04/11/14  5:59 PM      Result Value Ref Range Status   Specimen Description BLOOD LEFT ARM   Final   Special Requests BOTTLES DRAWN AEROBIC AND ANAEROBIC  10 CC   Final   Culture  Setup Time     Final   Value: 04/11/2014 22:33     Performed at [facility]   Culture     Final   Value:        BLOOD CULTURE RECEIVED NO GROWTH TO DATE CULTURE WILL BE HELD FOR 5 DAYS BEFORE ISSUING A FINAL NEGATIVE REPORT     Performed at [facility]   Report Status PENDING   Incomplete     Studies: Dg Chest Port 1 View  04/14/2014   CLINICAL DATA:  Respiratory failure and right chest pain. Shortness of breath.  EXAM: PORTABLE CHEST - 1 VIEW  COMPARISON:  04/13/2014  FINDINGS: Port-A-Cath tip in the SVC region. Increased densities at both lung bases may represent worsening atelectasis or pleural fluid. Again noted are prominent lung markings. Heart size is stable. Negative for a  pneumothorax. Probable scarring at the left lung apex.  IMPRESSION: Increased basilar densities may represent worsening atelectasis or pleural effusions.  Prominent lung markings suggest mild edema.   Electronically Signed   By: Markus Daft M.D.   On: 04/14/2014 07:32   Dg Chest Port 1 View  04/13/2014   CLINICAL DATA:  Respiratory failure, hypoxia  EXAM: PORTABLE CHEST - 1 VIEW  COMPARISON:  04/04/2014  FINDINGS: Cardiomediastinal silhouette is stable. Slight improvement in aeration. Mild interstitial prominence bilaterally without convincing pulmonary edema. Streaky bilateral basilar atelectasis or infiltrate. Stable left subclavian Port-A-Cath position.  IMPRESSION: Slight improvement in aeration. Mild interstitial prominence bilaterally without convincing pulmonary edema. Streaky bilateral basilar atelectasis or infiltrate.   Electronically Signed   By: Lahoma Crocker M.D.   On: 04/13/2014 11:39   Dg Abd Portable 1v  04/13/2014   CLINICAL DATA:  severe abdominal pain and constipation  EXAM: PORTABLE ABDOMEN - 1 VIEW  COMPARISON:  04/10/2014  FINDINGS: No small bowel air-fluid levels are noted. Mild gaseous distended small bowel loops probable ileus. There is moderate stool in right colon. Residual contrast material in right colon and proximal left colon. Moderate gaseous distension of transverse colon and rectosigmoid colon. No free abdominal air.  IMPRESSION: No small bowel air-fluid levels. Mild gaseous distended small bowel loops probable ileus. Stool in right colon. Moderate colonic gaseous distension.   Electronically Signed   By: Lahoma Crocker M.D.   On: 04/13/2014 09:38    Scheduled Meds: . antiseptic oral rinse  7 mL Mouth Rinse q12n4p  . budesonide  0.25 mg Nebulization Q12H  . ceFEPime (MAXIPIME) IV  1 g Intravenous Q8H  . chlorhexidine  15 mL Mouth Rinse BID  . ipratropium  0.5 mg Nebulization Q6H  . levalbuterol  0.63 mg Nebulization Q6H  . metronidazole  500 mg Intravenous Q8H  .  pantoprazole (PROTONIX) IV  40 mg Intravenous Daily  . polyethylene glycol  17 g Oral BID  . senna-docusate  1 tablet Oral BID  . vancomycin  1,000 mg Intravenous Q12H  Continuous Infusions:    Principal Problem:   Sepsis Active Problems:   COPD (chronic obstructive pulmonary disease)   Breast cancer metastasized to bone   Hypertension   Septic shock   Acute on chronic respiratory failure    Time spent: 30 min    Shahida Schnackenberg, [facility] Pager [phone]. If 7PM-7AM, please contact night-coverage at [email or web address], password [facility] 04/14/2014, 8:59 AM  LOS: 4 days

## 2014-04-14 NOTE — Progress Notes (Signed)
[place] Progress Note Patient Name: [name] DOB: 05-09-1956 MRN: [id number]   Date of Service  04/14/2014  HPI/Events of Note  Sinus tachycardia continues but no hemodynamic instability Takes ativan q8h at home  eICU Interventions  Ativan x1 now     Intervention Category Major Interventions: Arrhythmia - evaluation and management  Micco Bourbeau 04/14/2014, 4:28 PM

## 2014-04-14 NOTE — Progress Notes (Signed)
PULMONARY / CRITICAL CARE MEDICINE   Name: [name] MRN: [id number] DOB: 01-02-1956    ADMISSION DATE:  04/10/2014 CONSULTATION DATE:  04/11/2014  REFERRING MD :  Dr. Sheran Fava  CHIEF COMPLAINT:  Fever  INITIAL PRESENTATION:  58 yo female smoker sent to ER from [place] center with weakness, fever, chest pain, productive cough and confusion.  She has hx of metastatic breast cancer on chemo (eribulin), COPD.  Developed progressive fever, tachycardia, dyspnea and PCCM consulted to assist with management.  STUDIES:  10/26 CT head >> Lt frontal parietal metastatic lesion with mild edema 10/26 CT abd/pelvis >> ATX b/l, CBD 15 mm, atrophic pancreas, extensive atherosclerosis, innumerable metastatic bone lesions 10/27 CT chest >> small b/l effusions with ATX, 1.9 cm Rt suprahilar mass, no PE 10/30 [place] chest , small rt effusion and left sliver of fluid. Not enough to tap.   SIGNIFICANT EVENTS: 10/26 Admit 10/29 resp distress with activity  10/30 Resp distress continues with any activity.  HISTORY OF PRESENT ILLNESS:   58 yo female smoker sent to ER from [place] center with weakness, fever, chest pain, productive cough and confusion.  She has hx of metastatic breast cancer on chemo (eribulin), COPD.  Developed progressive fever, tachycardia, dyspnea and PCCM consulted to assist with management.  SUBJECTIVE:  Awake and alert, increased wob and air hunger with activity.Sitting on side of bed for [place] created air hunger and tachypnea .  VITAL SIGNS: Temp:  [97.8 F (36.6 C)-103.6 F (39.8 C)] 100.4 F (38 C) (10/30 0900) Pulse Rate:  [55-131] 124 (10/30 1000) Resp:  [14-49] 27 (10/30 1000) BP: (91-151)/(51-124) 119/76 mmHg (10/30 1000) SpO2:  [82 %-100 %] 96 % (10/30 1000) Weight:  [118 lb 13.3 oz (53.9 kg)] 118 lb 13.3 oz (53.9 kg) (10/30 0500) HEMODYNAMICS:   VENTILATOR SETTINGS:   INTAKE / OUTPUT:  Intake/Output Summary (Last 24 hours) at 04/14/14 1012 Last data filed at  04/14/14 0700  Gross per 24 hour  Intake    755 ml  Output   1355 ml  Net   -600 ml    PHYSICAL EXAMINATION: General:  Chronically ill appearing female, Increased wob, especially with any activity.  Neuro:  Alert and interactive, following all commands. HEENT:  Neg LAN/JVD Cardiovascular:  RRR, Nl S1/S2, -M/R/G. Lungs: Tight with increased wob, +++ wheeze, air hunger, increased use of accessory muscles Abdomen:  Soft, NT, ND and +BS. Musculoskeletal:  -edema and -tenderness. Skin: Intact.  LABS:  CBC  Recent Labs Lab 04/12/14 0501 04/13/14 0545 04/14/14 0135  WBC 14.4* 19.1* 17.0*  HGB 8.1* 8.1* 8.1*  HCT 24.2* 24.4* 24.4*  PLT 187 191 173   Coag's No results found for this basename: APTT, INR,  in the last 168 hours BMET  Recent Labs Lab 04/12/14 0501 04/13/14 0545 04/14/14 0135  NA 140 138 137  K 3.6* 3.2* 4.4  CL 104 103 103  CO2 19 20 20   BUN 13 17 16   CREATININE 0.75 0.75 0.69  GLUCOSE 176* 137* 106*   Electrolytes  Recent Labs Lab 04/12/14 0501 04/13/14 0545 04/14/14 0135  CALCIUM 10.1 9.6 9.6  MG 2.0  --   --   PHOS 6.2*  --   --    Sepsis Markers  Recent Labs Lab 04/10/14 1553 04/10/14 1839 04/11/14 1750 04/11/14 1847 04/12/14 0501 04/13/14 0545  LATICACIDVEN 1.90 0.82 1.2  --   --   --   PROCALCITON  --   --   --  4.83 4.01 3.20   ABG  Recent Labs Lab 04/11/14 1750  PHART 7.342*  PCO2ART 31.5*  PO2ART 76.0*   Liver Enzymes  Recent Labs Lab 04/10/14 1537 04/11/14 0435 04/11/14 1750  AST 87* 57* 66*  ALT 15 11 11   ALKPHOS 108 85 92  BILITOT 0.4 0.2* 0.3  ALBUMIN 3.4* 2.5* 2.5*   Cardiac Enzymes  Recent Labs Lab 04/10/14 1537  04/13/14 1330 04/13/14 1910 04/14/14 0135  TROPONINI <0.30  < > <0.30 <0.30 <0.30  PROBNP 298.3*  --   --   --   --   < > = values in this interval not displayed. Glucose No results found for this basename: GLUCAP,  in the last 168 hours   Lab Results  Component Value Date   TSH  5.360* 04/10/2014     Imaging Dg Chest Port 1 View  04/13/2014   CLINICAL DATA:  Respiratory failure, hypoxia  EXAM: PORTABLE CHEST - 1 VIEW  COMPARISON:  04/04/2014  FINDINGS: Cardiomediastinal silhouette is stable. Slight improvement in aeration. Mild interstitial prominence bilaterally without convincing pulmonary edema. Streaky bilateral basilar atelectasis or infiltrate. Stable left subclavian Port-A-Cath position.  IMPRESSION: Slight improvement in aeration. Mild interstitial prominence bilaterally without convincing pulmonary edema. Streaky bilateral basilar atelectasis or infiltrate.   Electronically Signed   By: Lahoma Crocker M.D.   On: 04/13/2014 11:39   Dg Abd Portable 1v  04/13/2014   CLINICAL DATA:  severe abdominal pain and constipation  EXAM: PORTABLE ABDOMEN - 1 VIEW  COMPARISON:  04/10/2014  FINDINGS: No small bowel air-fluid levels are noted. Mild gaseous distended small bowel loops probable ileus. There is moderate stool in right colon. Residual contrast material in right colon and proximal left colon. Moderate gaseous distension of transverse colon and rectosigmoid colon. No free abdominal air.  IMPRESSION: No small bowel air-fluid levels. Mild gaseous distended small bowel loops probable ileus. Stool in right colon. Moderate colonic gaseous distension.   Electronically Signed   By: Lahoma Crocker M.D.   On: 04/13/2014 09:38     ASSESSMENT / PLAN:  PULMONARY A: Acute respiratory distress in setting of sepsis Reported hx of COPD with continue tobacco abuse, active bronchospasm 10/30. Suspect her COPD is worse that first thought. Small B effusions P:   Continue scheduled BD's with ipratropium/xopenex, add pulmicort bid 10/29 F/u CXR Oxygen to keep SpO2 > 90% BiPAP prn > suspect she will no longer require Bronchial hygiene Solumedrol scheduled Aggressive diuresis as she can tolerate Needs to be mobilized to help w ATX, but her dyspnea is making activity difficult Assessed  pleural effusions by [place] 10/30 > not enough to tap  CARDIOVASCULAR Lt port >> A:  Severe sepsis. 10/29 resolved P:  KVO IVF Monitor hemodynamics > improved.  Attempt diuresis 10/30  RENAL A:   Metabolic acidosis. Resolving 10/28 with IVF P:   F/u BMET, optimize hemodynamics Monitor renal fx, urine outpt Replace electrolytes as indicated.   HEMATOLOGIC A:   Hx of metastatic breast cancer. Anemia of critical illness and chronic disease. P:  F/u CBC Lovenox for DVT prevention  INFECTIOUS A:   Severe sepsis, treating as HCAP P:   Day 5 of Abx, currently on cefepime, flagyl and vancomycin. Consider narrowing in absence positive cx data. Would d/c flagyl  Blood 10/26 >> Urine 10/26 >> negative Blood 10/27 >>  ENDOCRINE A:   Mild elevation in TSH from 10/26. P:   ? Role for levothyroxine here. In setting acute illness difficult to  say, but both T3 and freeT4 are low/normal.   NEUROLOGIC A:   Acute encephalopathy 2nd to sepsis. (resolved) P:   Monitor mental status   TODAY'S SUMMARY:  Increased wob, bronchospastic, ultrasound chest with very small rt effusion and sliver on left, not tapped. Diuresis and Start steroids on 10/30. She is at some risk for intubation.    [name] Minor ACNP [facility] Pager [phone] till 3 pm If no answer page [phone] 04/14/2014, 10:12 AM    Attending note:  I have examined pt, reviewed all data, studies. I have discussed case w S. Minor and agree with the notes as amended above. She appears worse to me today - more dyspnea, more wheeze on my exam. Plan will be to add systemic steroids for presumed AE-COPD. B effusions are too small to tap.   Baltazar Apo, MD, PhD 04/14/2014, 11:24 AM [facility] [phone] or if no answer [phone]

## 2014-04-15 ENCOUNTER — Inpatient Hospital Stay (HOSPITAL_COMMUNITY): Payer: Medicaid Other

## 2014-04-15 DIAGNOSIS — M7989 Other specified soft tissue disorders: Secondary | ICD-10-CM

## 2014-04-15 DIAGNOSIS — J441 Chronic obstructive pulmonary disease with (acute) exacerbation: Secondary | ICD-10-CM

## 2014-04-15 DIAGNOSIS — I471 Supraventricular tachycardia: Secondary | ICD-10-CM

## 2014-04-15 LAB — BASIC METABOLIC PANEL
ANION GAP: 14 (ref 5–15)
BUN: 16 mg/dL (ref 6–23)
CO2: 24 mEq/L (ref 19–32)
Calcium: 10.4 mg/dL (ref 8.4–10.5)
Chloride: 100 mEq/L (ref 96–112)
Creatinine, Ser: 0.49 mg/dL — ABNORMAL LOW (ref 0.50–1.10)
GFR calc non Af Amer: >90 mL/min (ref >90)
Glucose, Bld: 142 mg/dL — ABNORMAL HIGH (ref 70–99)
Potassium: 3.9 mEq/L (ref 3.7–5.3)
SODIUM: 138 meq/L (ref 137–147)

## 2014-04-15 LAB — CBC
HCT: 24.8 % — ABNORMAL LOW (ref 36.0–46.0)
HEMOGLOBIN: 8.1 g/dL — AB (ref 12.0–15.0)
MCH: 30 pg (ref 26.0–34.0)
MCHC: 32.7 g/dL (ref 30.0–36.0)
MCV: 91.9 fL (ref 78.0–100.0)
Platelets: 155 10*3/uL (ref 150–400)
RBC: 2.7 MIL/uL — ABNORMAL LOW (ref 3.87–5.11)
RDW: 20.6 % — ABNORMAL HIGH (ref 11.5–15.5)
WBC: 20.8 10*3/uL — AB (ref 4.0–10.5)

## 2014-04-15 MED ORDER — SODIUM CHLORIDE 0.9 % IV SOLN
INTRAVENOUS | Status: DC
  Administered 2014-04-15 (×2): via INTRAVENOUS

## 2014-04-15 MED ORDER — METHYLPREDNISOLONE SODIUM SUCC 125 MG IJ SOLR
60.0000 mg | Freq: Two times a day (BID) | INTRAMUSCULAR | Status: DC
  Administered 2014-04-15 – 2014-04-17 (×4): 60 mg via INTRAVENOUS
  Filled 2014-04-15 (×2): qty 0.96
  Filled 2014-04-15: qty 2
  Filled 2014-04-15: qty 0.96
  Filled 2014-04-15: qty 2

## 2014-04-15 MED ORDER — ENOXAPARIN SODIUM 40 MG/0.4ML ~~LOC~~ SOLN
40.0000 mg | SUBCUTANEOUS | Status: DC
  Administered 2014-04-15: 40 mg via SUBCUTANEOUS
  Filled 2014-04-15: qty 0.4

## 2014-04-15 MED ORDER — BUDESONIDE 0.25 MG/2ML IN SUSP
0.5000 mg | Freq: Two times a day (BID) | RESPIRATORY_TRACT | Status: DC
  Administered 2014-04-15 – 2014-04-16 (×3): 0.5 mg via RESPIRATORY_TRACT
  Filled 2014-04-15 (×7): qty 4

## 2014-04-15 MED ORDER — LEVALBUTEROL HCL 0.63 MG/3ML IN NEBU: 0.6300 mg | INHALATION_SOLUTION | RESPIRATORY_TRACT | Status: DC | PRN

## 2014-04-15 MED ORDER — ENOXAPARIN SODIUM 60 MG/0.6ML ~~LOC~~ SOLN
1.0000 mg/kg | Freq: Two times a day (BID) | SUBCUTANEOUS | Status: DC
  Administered 2014-04-15 – 2014-04-18 (×5): 50 mg via SUBCUTANEOUS
  Filled 2014-04-15 (×7): qty 0.6

## 2014-04-15 MED ORDER — ARFORMOTEROL TARTRATE 15 MCG/2ML IN NEBU
15.0000 ug | INHALATION_SOLUTION | Freq: Two times a day (BID) | RESPIRATORY_TRACT | Status: DC
  Administered 2014-04-15 – 2014-04-18 (×7): 15 ug via RESPIRATORY_TRACT
  Filled 2014-04-15 (×10): qty 2

## 2014-04-15 NOTE — Progress Notes (Signed)
Heartrate increasing to 130's. Text-paged MD Regalado to make aware. Will continue to monitor and await callback.

## 2014-04-15 NOTE — Progress Notes (Signed)
Called MD Regalado to make aware of vascular findings (DVT). Will continue to monitor.

## 2014-04-15 NOTE — Progress Notes (Signed)
PULMONARY / CRITICAL CARE MEDICINE   Name: [name] MRN: [id number] DOB: 1955/10/25    ADMISSION DATE:  04/10/2014 CONSULTATION DATE:  04/11/2014  REFERRING MD :  Dr. Sheran Fava  CHIEF COMPLAINT:  Fever  INITIAL PRESENTATION:  58 yo female smoker sent to ER from [facility] with weakness, fever, chest pain, productive cough and confusion.  She has hx of metastatic breast cancer on chemo (eribulin), COPD.  Developed progressive fever, tachycardia, dyspnea and PCCM consulted to assist with management.  STUDIES:  10/26 CT head >> Lt frontal parietal metastatic lesion with mild edema 10/26 CT abd/pelvis >> ATX b/l, CBD 15 mm, atrophic pancreas, extensive atherosclerosis, innumerable metastatic bone lesions 10/27 CT chest >> small b/l effusions with ATX, 1.9 cm Rt suprahilar mass, no PE 10/30 [place] chest , small rt effusion and left sliver of fluid. Not enough to tap.   SIGNIFICANT EVENTS: 10/26 Admit 10/29 resp distress with activity  10/30 Resp distress continues with any activity 10/31 improved resp distress, sinus tach improved overnight with fluids  SUBJECTIVE:  improved resp distress, sinus tach improved overnight with fluids .  VITAL SIGNS: Temp:  [98.8 F (37.1 C)-102 F (38.9 C)] 99 F (37.2 C) (10/31 0800) Pulse Rate:  [106-147] 108 (10/31 0800) Resp:  [17-36] 19 (10/31 0800) BP: (98-144)/(48-79) 117/61 mmHg (10/31 0800) SpO2:  [92 %-100 %] 95 % (10/31 0800) Weight:  [52.2 kg (115 lb 1.3 oz)] 52.2 kg (115 lb 1.3 oz) (10/31 0600) HEMODYNAMICS:   VENTILATOR SETTINGS:   INTAKE / OUTPUT:  Intake/Output Summary (Last 24 hours) at 04/15/14 0919 Last data filed at 04/15/14 0900  Gross per 24 hour  Intake   1150 ml  Output   3580 ml  Net  -2430 ml    PHYSICAL EXAMINATION:  Gen: well appearing, no distress HEENT: NCAT, EOMi PULM: CTA B no wheezing CV: tachy, regular, no mgr AB: BS+, soft,  Ext: warm, no edema Neuro: A&OX4, maew  LABS:  CBC  Recent  Labs Lab 04/13/14 0545 04/14/14 0135 04/15/14 0505  WBC 19.1* 17.0* 20.8*  HGB 8.1* 8.1* 8.1*  HCT 24.4* 24.4* 24.8*  PLT 191 173 155   Coag's No results found for this basename: APTT, INR,  in the last 168 hours BMET  Recent Labs Lab 04/14/14 0135 04/14/14 1640 04/15/14 0505  NA 137 137 138  K 4.4 4.0 3.9  CL 103 99 100  CO2 20 22 24   BUN 16 13 16   CREATININE 0.69 0.66 0.49*  GLUCOSE 106* 104* 142*   Electrolytes  Recent Labs Lab 04/12/14 0501  04/14/14 0135 04/14/14 1640 04/15/14 0505  CALCIUM 10.1  < > 9.6 10.6* 10.4  MG 2.0  --   --  1.7  --   PHOS 6.2*  --   --   --   --   < > = values in this interval not displayed. Sepsis Markers  Recent Labs Lab 04/10/14 1553 04/10/14 1839 04/11/14 1750 04/11/14 1847 04/12/14 0501 04/13/14 0545  LATICACIDVEN 1.90 0.82 1.2  --   --   --   PROCALCITON  --   --   --  4.83 4.01 3.20   ABG  Recent Labs Lab 04/11/14 1750  PHART 7.342*  PCO2ART 31.5*  PO2ART 76.0*   Liver Enzymes  Recent Labs Lab 04/10/14 1537 04/11/14 0435 04/11/14 1750  AST 87* 57* 66*  ALT 15 11 11   ALKPHOS 108 85 92  BILITOT 0.4 0.2* 0.3  ALBUMIN 3.4* 2.5* 2.5*  Cardiac Enzymes  Recent Labs Lab 04/10/14 1537  04/13/14 1330 04/13/14 1910 04/14/14 0135  TROPONINI <0.30  < > <0.30 <0.30 <0.30  PROBNP 298.3*  --   --   --   --   < > = values in this interval not displayed. Glucose No results found for this basename: GLUCAP,  in the last 168 hours   Lab Results  Component Value Date   TSH 5.360* 04/10/2014     Imaging Dg Chest Port 1 View  04/14/2014   CLINICAL DATA:  Respiratory failure and right chest pain. Shortness of breath.  EXAM: PORTABLE CHEST - 1 VIEW  COMPARISON:  04/13/2014  FINDINGS: Port-A-Cath tip in the SVC region. Increased densities at both lung bases may represent worsening atelectasis or pleural fluid. Again noted are prominent lung markings. Heart size is stable. Negative for a pneumothorax.  Probable scarring at the left lung apex.  IMPRESSION: Increased basilar densities may represent worsening atelectasis or pleural effusions.  Prominent lung markings suggest mild edema.   Electronically Signed   By: Markus Daft M.D.   On: 04/14/2014 07:32     ASSESSMENT / PLAN:  PULMONARY A: AE COPD Right lung mass P:   Brovana/Pulmicort Change xopenex/ipratropium to prn given tachycardia Wean solumedrol O2 as needed for O2 sat > 92%  CARDIOVASCULAR Lt port >> A:  Severe sepsis. 10/29 resolved Hypovolemic mediated sinus tachycardia P:  Gentle IVF today Tele  RENAL A:   Metabolic acidosis. Resolved P:   Monitor BMET and UOP Replace electrolytes as needed  HEMATOLOGIC A:   Hx of metastatic breast cancer. Anemia of critical illness and chronic disease. P:  CBC intermittently Lovenox for DVT prevention  INFECTIOUS A:   Severe sepsis, treating as HCAP > improved P:   Day 6 of Abx, currently on cefepime, flagyl and vancomycin.  D/c vanc  Blood 10/26 >> Urine 10/26 >> negative Blood 10/27 >>  ENDOCRINE A:   Mild elevation in TSH from 10/26. P:   Synthroid per TR[facility]  NEUROLOGIC A:   Acute encephalopathy 2nd to sepsis. (resolved) P:   Monitor mental status   TODAY'S SUMMARY: Improved respiratory status, over diuresed as sinus tachycardia improved with saline.   PCCM will be available on Sunday, will see again on 11/2  Roselie Awkward, MD [facility] Pager: [phone] Cell: [phone] If no response, call [phone]

## 2014-04-15 NOTE — Progress Notes (Signed)
ANTIBIOTIC CONSULT NOTE - FOLLOW UP  Pharmacy Consult for Cefepime Indication: sepsis, HCAP  Allergies  Allergen Reactions  . Penicillins Other (See Comments)    Reaction a long time ago    Patient Measurements: Height: 4\' 11"  (149.9 cm) Weight: 115 lb 1.3 oz (52.2 kg) IBW/kg (Calculated) : 43.2  Vital Signs: Temp: 99 F (37.2 C) (10/31 0800) Temp Source: Core (Comment) (10/31 0800) BP: 117/61 mmHg (10/31 0800) Pulse Rate: 108 (10/31 0800) Intake/Output from previous day: 10/30 0701 - 10/31 0700 In: 1050 [P.O.:160; IV Piggyback:650] Out: 3580 [Urine:3580]  Labs:  Recent Labs  04/13/14 0545 04/14/14 0135 04/14/14 1640 04/15/14 0505  WBC 19.1* 17.0*  --  20.8*  HGB 8.1* 8.1*  --  8.1*  PLT 191 173  --  155  CREATININE 0.75 0.69 0.66 0.49*   Estimated Creatinine Clearance: 55.9 ml/min (by C-G formula based on Cr of 0.49).  Recent Labs  04/12/14 1750  VANCOTROUGH 8.5*      Assessment: 58yo F sent over from [facility] on 10/26 with fever, weakness, and AMS.  PMH includes COPD, HTN, and stage IV breast cancer.  She receives chemo at [facility], last given on 03/27/14; her chemo 10/26 was held and she was transferred to ED.  CXR shows bilateral atelectasis vs infiltrates.  Pharmacy is consulted to dose vancomycin and cefepime for r/o sepsis.  10/26 >> Vanc >> 10/31 10/26 >> Aztreonam >> 10/27 10/26 >> Levaquin >> 10/27 10/27 >> Cefepime >>  10/27 >> Metronidazole >> 10/30  10/26 MRSA PCR: negative 10/26 blood x2: ngtd 10/26 urine: NGF 10/27 blood x2: ngtd 10/27 flu panel: neg 10/27 urine strep/legionella: both neg  Today, 10/31  Day #6 antibiotics  Tmax: 103.6 10/30 AM, afebrile now  WBCs: increased to 20.8 on steroids (not neutropenic, ANC 11.5 on 10/27)  Renal: SCr 0.49, CrCl 56 ml/min  Lactic acid: 1.2 (10/27)  PCT: 4.83 > 4.01 (10/28) > 3.2 (10/29)  Goal of Therapy:  Appropriate dose for indication / renal function  Plan:   Continue Cefepime 1g IV  q8h  Follow up renal fxn and culture results, duration of therapy  Gretta Arab PharmD, BCPS Pager [phone] 04/15/2014 11:22 AM

## 2014-04-15 NOTE — Progress Notes (Signed)
VASCULAR LAB PRELIMINARY  PRELIMINARY  PRELIMINARY  PRELIMINARY  Bilateral lower extremity venous duplex completed.    Preliminary report:  Right:  No evidence of DVT, superficial thrombosis, or Baker's cyst. Left: Positive DVT noted in the gastrocnemius vein.  No evidence of superficial thrombosis.  No Baker's cyst.  Varetta Chavers, RVS 04/15/2014, 1:15 PM

## 2014-04-15 NOTE — Progress Notes (Signed)
Spoke with MD Regalado at bedside and wanted to confirm expected heartrate range. MD confirms sinus tachycardia 115-120's is within expected range. Will continue to monitor.

## 2014-04-15 NOTE — Progress Notes (Signed)
[facility] PROGRESS NOTE  [name] TTS:[id number] DOB: [birth year] DOA: 04/10/2014 PCP: Lorayne Marek, MD  Brief Summary  [name] is a 58 y.o. female history of metastatic breast cancer, COPD and hypertension was referred to the ER from the [facility] after patient was found to be weak and confused. Patient had gone to her regular chemotherapy and was found to be confused and weak.  In the ER, she was febrile and complaining of right lower chest pain, productive cough, and possible abdominal pain.  Chest x-ray demonstrated bilateral atelectasis versus infiltrates and CT abdomen and pelvis was unremarkable. Patient has been started on empiric antibiotics for possible sepsis and admitted to stepdown.  Assessment/Plan  Sepsis,  probably from pneumonia ? .   -  Continue vancomycin day 6 and cefepime  5. Received flagyl for 4 days. Flagyl discontinue 10-30.  -  CT angio negative for PE.  -  blood cultures no growth to date.  -  Legionella and strep pneumo ag negative.  -  Flu PCR negative -  Sputum culture if able -  UA neg - CT abd/pelvis neg 10-26.  -WBC increase today but she was started on Solumedrol 10-30.  -continue with same regimen of antibiotics.   Acute Hypoxic respiratory failure; multifactorial: PNA, COPD, metastasis diseases.  -BIPAP PRN.  -Received IV lasix overnight 10-28.  -NSL fluids.  -CT angio negative for PE. Stable mediastinal adenopathy and right suprahilar mass is noted consistent with metastatic disease. -increase oxygen requirement on 10-29.  received 60 mg lasix 10-30. Tachycardia got worse.  -Chest x ray 10-31; Rapid improvement in the bilateral pulmonary edema. Diminished small  bilateral pleural effusions.  Sinus Tachycardia; IV fluids order. If continue can try blood transfusion.   Chest pain: resolved. Troponin negative. Started on protonix.   Constipation, Ileus.;  Continue with clear diet.  KUB 10-30. Mild gaseous distended  small bowel loops probable ileus. Stool in right colon. Moderate colonic gaseous distension. Continue with miralax, docusate. Dulcolax suppository PRN.  Dulcolax today. Advance diet.   Acute encephalopathy due to sepsis and high fever - will resume flexeril PRN, careful with oversedation.  -  Treat infection  Generalized weakness from infection and dehydration.  IVF and abx -  PT/OT consults  Metastatic breast cancer -  Dr. Jana Hakim on rounding list  Hypercalcemia likely secondary to dehydration and already improved with IVF  HTN with borderline low BPs -  Hold BP medications  COPD;  -  Continue flovent -  duonebs with albuterol prn  Leukocytosis due to sepsis, continue with  Abx.  Anemia of chronic disease, hemoglobin trending down with IVF -  Transfuse for hgb < 7 or symptomatic anemia - Hb stable at 8.   Metabolic acidosis with mild gap likely due to ketosis and dehydration.  Lactic acid was normal.  Improving with IVF Resolved.    Sick euthyroid -  Repeat TSH in 4-6 weeks  Diet:  regular Access:  port IVF:  yes Proph:  lovenox  Code Status: full Family Communication: patient alone and family member at bedside.  Disposition Plan: continue stepdown.    Consultants:  Oncology added to rounding list  Procedures:  10/26 CT head unremarkable  10/26 CT abd/pelvis neg  10/26 CXR bibasilar PNA vs. aletelectasis  Antibiotics:  Vancomycin 10/26 >>  Aztreonam 10/26 > 10/27  Levofloxacin 10/26   Cefepime 10/27 >>  HPI/Subjective: Feeling ok. No BM yesterday, passing gas.  Breathing ok.   Objective: Filed Vitals:  04/15/14 0300 04/15/14 0400 04/15/14 0500 04/15/14 0600  BP: 140/73 113/72 133/72 127/59  Pulse: 110 110 110 110  Temp: 99.3 F (37.4 C) 99.3 F (37.4 C) 99.1 F (37.3 C) 98.8 F (37.1 C)  TempSrc:  Core (Comment)    Resp: 18 17 19 18   Height:      Weight:    52.2 kg (115 lb 1.3 oz)  SpO2: 96% 98% 96% 97%    Intake/Output  Summary (Last 24 hours) at 04/15/14 0753 Last data filed at 04/15/14 0600  Gross per 24 hour  Intake   1040 ml  Output   3580 ml  Net  -2540 ml   Filed Weights   04/12/14 0400 04/14/14 0500 04/15/14 0600  Weight: 52.4 kg (115 lb 8.3 oz) 53.9 kg (118 lb 13.3 oz) 52.2 kg (115 lb 1.3 oz)    Exam:   General:  Thin BF, tachypneic.   Cardiovascular:  Tachycardic RR, nl S1, S2.   Respiratory:  CTAB, tachypneic.   Abdomen:   NABS, soft, mildly distended and mild tenderness.   MSK:  No edema.   Neuro:  Alert and oriented.   Data Reviewed: Basic Metabolic Panel:  Recent Labs Lab 04/11/14 1750 04/12/14 0501 04/13/14 0545 04/14/14 0135 04/14/14 1640 04/15/14 0505  NA 132* 140 138 137 137 138  K 3.7 3.6* 3.2* 4.4 4.0 3.9  CL 99 104 103 103 99 100  CO2 16* 19 20 20 22 24   GLUCOSE 90 176* 137* 106* 104* 142*  BUN 13 13 17 16 13 16   CREATININE 0.80 0.75 0.75 0.69 0.66 0.49*  CALCIUM 10.2 10.1 9.6 9.6 10.6* 10.4  MG  --  2.0  --   --  1.7  --   PHOS  --  6.2*  --   --   --   --    Liver Function Tests:  Recent Labs Lab 04/10/14 1428 04/10/14 1537 04/11/14 0435 04/11/14 1750  AST 90* 87* 57* 66*  ALT 19 15 11 11   ALKPHOS 107 108 85 92  BILITOT 0.49 0.4 0.2* 0.3  PROT 7.9 7.9 6.2 6.6  ALBUMIN 3.2* 3.4* 2.5* 2.5*    Recent Labs Lab 04/10/14 1537  LIPASE 9*    Recent Labs Lab 04/10/14 1537  AMMONIA 44   CBC:  Recent Labs Lab 04/10/14 1427 04/10/14 1537  04/11/14 0435 04/11/14 1750 04/12/14 0501 04/13/14 0545 04/14/14 0135 04/15/14 0505  WBC 16.7* 16.4*  < > 13.0* 13.5* 14.4* 19.1* 17.0* 20.8*  NEUTROABS 13.6* 13.3*  --  10.8* 11.5*  --   --   --   --   HGB 11.2* 10.7*  < > 8.4* 8.8* 8.1* 8.1* 8.1* 8.1*  HCT 33.7* 32.0*  < > 25.8* 27.1* 24.2* 24.4* 24.4* 24.8*  MCV 92.3 92.0  < > 94.2 92.5 91.0 91.0 92.4 91.9  PLT 222 227  < > 180 211 187 191 173 155  < > = values in this interval not displayed. Cardiac Enzymes:  Recent Labs Lab  04/10/14 1537 04/10/14 2300 04/13/14 1330 04/13/14 1910 04/14/14 0135  TROPONINI <0.30 <0.30 <0.30 <0.30 <0.30   BNP (last 3 results)  Recent Labs  04/10/14 1537  PROBNP 298.3*   CBG: No results found for this basename: GLUCAP,  in the last 168 hours  Recent Results (from the past 240 hour(s))  TECHNOLOGIST REVIEW     Status: None   Collection Time    04/10/14  2:27 PM  Result Value Ref Range Status   Technologist Review 2% Nrbcs, 3% metamyelocytes   Final  CULTURE, BLOOD (ROUTINE X 2)     Status: None   Collection Time    04/10/14  3:20 PM      Result Value Ref Range Status   Specimen Description BLOOD LEFT CHEST   Final   Special Requests BOTTLES DRAWN AEROBIC AND ANAEROBIC 5 CC EA   Final   Culture  Setup Time     Final   Value: 04/10/2014 21:17     Performed at [facility]   Culture     Final   Value:        BLOOD CULTURE RECEIVED NO GROWTH TO DATE CULTURE WILL BE HELD FOR 5 DAYS BEFORE ISSUING A FINAL NEGATIVE REPORT     Performed at [facility]   Report Status PENDING   Incomplete  CULTURE, BLOOD (ROUTINE X 2)     Status: None   Collection Time    04/10/14  3:25 PM      Result Value Ref Range Status   Specimen Description BLOOD LEFT ARM   Final   Special Requests BOTTLES DRAWN AEROBIC AND ANAEROBIC 8CC   Final   Culture  Setup Time     Final   Value: 04/11/2014 03:55     Performed at [facility]   Culture     Final   Value:        BLOOD CULTURE RECEIVED NO GROWTH TO DATE CULTURE WILL BE HELD FOR 5 DAYS BEFORE ISSUING A FINAL NEGATIVE REPORT     Performed at [facility]   Report Status PENDING   Incomplete  URINE CULTURE     Status: None   Collection Time    04/10/14  3:37 PM      Result Value Ref Range Status   Specimen Description URINE, CATHETERIZED   Final   Special Requests NONE   Final   Culture  Setup Time     Final   Value: 04/10/2014 21:31     Performed at [place] [facility]     Final    Value: NO GROWTH     Performed at [facility]   Culture     Final   Value: NO GROWTH     Performed at [facility]   Report Status 04/11/2014 FINAL   Final  MRSA PCR SCREENING     Status: None   Collection Time    04/10/14 10:20 PM      Result Value Ref Range Status   MRSA by PCR NEGATIVE  NEGATIVE Final   Comment:            The GeneXpert MRSA Assay (FDA     approved for NASAL specimens     only), is one component of a     comprehensive MRSA colonization     surveillance program. It is not     intended to diagnose MRSA     infection nor to guide or     monitor treatment for     MRSA infections.  CULTURE, BLOOD (ROUTINE X 2)     Status: None   Collection Time    04/11/14  5:50 PM      Result Value Ref Range Status   Specimen Description BLOOD LEFT ARM   Final   Special Requests BOTTLES DRAWN AEROBIC AND ANAEROBIC  10 CC   Final   Culture  Setup Time     Final   Value: 04/11/2014 22:33     Performed at [facility]   Culture     Final   Value:        BLOOD CULTURE RECEIVED NO GROWTH TO DATE CULTURE WILL BE HELD FOR 5 DAYS BEFORE ISSUING A FINAL NEGATIVE REPORT     Performed at [facility]   Report Status PENDING   Incomplete  CULTURE, BLOOD (ROUTINE X 2)     Status: None   Collection Time    04/11/14  5:59 PM      Result Value Ref Range Status   Specimen Description BLOOD LEFT ARM   Final   Special Requests BOTTLES DRAWN AEROBIC AND ANAEROBIC  10 CC   Final   Culture  Setup Time     Final   Value: 04/11/2014 22:33     Performed at [facility]   Culture     Final   Value:        BLOOD CULTURE RECEIVED NO GROWTH TO DATE CULTURE WILL BE HELD FOR 5 DAYS BEFORE ISSUING A FINAL NEGATIVE REPORT     Performed at [facility]   Report Status PENDING   Incomplete     Studies: Dg Chest Port 1 View  04/15/2014   CLINICAL DATA:  Hypoxemia.  EXAM: PORTABLE CHEST - 1 VIEW  COMPARISON:  04/14/2014 and 04/13/2014 and CT scan dated  04/11/2014  FINDINGS: Power port in place, unchanged. There has been rapid clearing of the bilateral infiltrates and improvement in the bilateral small effusions. The findings probably represent pulmonary edema.  Spiculated 3 cm mass is noted in the right upper lobe. Heart size and vascularity are normal.  IMPRESSION: Rapid improvement in the bilateral pulmonary edema. Diminished small bilateral pleural effusions.   Electronically Signed   By: Rozetta Nunnery M.D.   On: 04/15/2014 05:37   Dg Chest Port 1 View  04/14/2014   CLINICAL DATA:  Respiratory failure and right chest pain. Shortness of breath.  EXAM: PORTABLE CHEST - 1 VIEW  COMPARISON:  04/13/2014  FINDINGS: Port-A-Cath tip in the SVC region. Increased densities at both lung bases may represent worsening atelectasis or pleural fluid. Again noted are prominent lung markings. Heart size is stable. Negative for a pneumothorax. Probable scarring at the left lung apex.  IMPRESSION: Increased basilar densities may represent worsening atelectasis or pleural effusions.  Prominent lung markings suggest mild edema.   Electronically Signed   By: Markus Daft M.D.   On: 04/14/2014 07:32   Dg Chest Port 1 View  04/13/2014   CLINICAL DATA:  Respiratory failure, hypoxia  EXAM: PORTABLE CHEST - 1 VIEW  COMPARISON:  04/04/2014  FINDINGS: Cardiomediastinal silhouette is stable. Slight improvement in aeration. Mild interstitial prominence bilaterally without convincing pulmonary edema. Streaky bilateral basilar atelectasis or infiltrate. Stable left subclavian Port-A-Cath position.  IMPRESSION: Slight improvement in aeration. Mild interstitial prominence bilaterally without convincing pulmonary edema. Streaky bilateral basilar atelectasis or infiltrate.   Electronically Signed   By: Lahoma Crocker M.D.   On: 04/13/2014 11:39   Dg Abd Portable 1v  04/13/2014   CLINICAL DATA:  severe abdominal pain and constipation  EXAM: PORTABLE ABDOMEN - 1 VIEW  COMPARISON:  04/10/2014   FINDINGS: No small bowel air-fluid levels are noted. Mild gaseous distended small bowel loops probable ileus. There is moderate stool in right colon. Residual contrast material in right colon and proximal left colon. Moderate gaseous distension of  transverse colon and rectosigmoid colon. No free abdominal air.  IMPRESSION: No small bowel air-fluid levels. Mild gaseous distended small bowel loops probable ileus. Stool in right colon. Moderate colonic gaseous distension.   Electronically Signed   By: Lahoma Crocker M.D.   On: 04/13/2014 09:38    Scheduled Meds: . antiseptic oral rinse  7 mL Mouth Rinse q12n4p  . budesonide  0.25 mg Nebulization Q12H  . ceFEPime (MAXIPIME) IV  1 g Intravenous Q8H  . chlorhexidine  15 mL Mouth Rinse BID  . ipratropium  0.5 mg Nebulization Q6H  . levalbuterol  0.63 mg Nebulization Q6H  . methylPREDNISolone (SOLU-MEDROL) injection  60 mg Intravenous Q6H  . pantoprazole (PROTONIX) IV  40 mg Intravenous Daily  . polyethylene glycol  17 g Oral BID  . senna-docusate  1 tablet Oral BID  . vancomycin  1,000 mg Intravenous Q12H   Continuous Infusions:    Principal Problem:   Sepsis Active Problems:   COPD (chronic obstructive pulmonary disease)   Breast cancer metastasized to bone   Hypertension   Septic shock   Acute on chronic respiratory failure    Time spent: 30 min    Corderro Koloski, [facility] Pager [phone]. If 7PM-7AM, please contact night-coverage at [email or web address], password [facility] 04/15/2014, 7:53 AM  LOS: 5 days

## 2014-04-15 NOTE — Progress Notes (Signed)
ANTICOAGULATION CONSULT NOTE - Initial Consult  Pharmacy Consult for Lovenox Indication: DVT  Allergies  Allergen Reactions  . Penicillins Other (See Comments)    Reaction a long time ago    Patient Measurements: Height: 4\' 11"  (149.9 cm) Weight: 115 lb 1.3 oz (52.2 kg) IBW/kg (Calculated) : 43.2  Vital Signs: Temp: 99.3 F (37.4 C) (10/31 1200) Temp Source: Core (Comment) (10/31 1200) BP: 111/55 mmHg (10/31 1200) Pulse Rate: 120 (10/31 1200)  Labs:  Recent Labs  04/13/14 0545 04/13/14 1330 04/13/14 1910 04/14/14 0135 04/14/14 1640 04/15/14 0505  HGB 8.1*  --   --  8.1*  --  8.1*  HCT 24.4*  --   --  24.4*  --  24.8*  PLT 191  --   --  173  --  155  CREATININE 0.75  --   --  0.69 0.66 0.49*  TROPONINI  --  <0.30 <0.30 <0.30  --   --     Estimated Creatinine Clearance: 55.9 ml/min (by C-G formula based on Cr of 0.49).   Medical History: Past Medical History  Diagnosis Date  . Acid reflux   . Asthma   . COPD (chronic obstructive pulmonary disease)   . Cancer     lumpectomy, radiation 2009  . Seasonal allergies   . Alcohol abuse   . Elevated d-dimer     negative chest CT  . Allergy   . Emphysema of lung   . Hot flashes   . Reading disorder   . Impaired writing skills   . Wears glasses   . Wears dentures     top  . HOH (hard of hearing)     left ear  . S/P radiation therapy 01/13/14    SRS left parietal 20Gy    Assessment: 86 yoF with h/o metastatic breast cancer, COPD and HTN. Patient was referred to the ER from cancer center after found to be weak and confused. Pharmacy consulted to dose Lovenox for treatment of DVT.  CBC: Hgb low but stable. Pltc WNL Imaging: bilateral lower extremity venous duplex reveals left DVT in the gastrocnemis vein. Renal: SCr 0.49 CrCl > 160ml/min  Goal of Therapy:  Appropriate anticoagulation for patient with DVT Monitor platelets by anticoagulation protocol: Yes   Plan:  Start Lovenox 1mg /kg (50mg ) subq  Q12H Follow CBC, SCr  [name], PharmD Pager: [phone] 04/15/2014 1:45 PM

## 2014-04-16 DIAGNOSIS — J9621 Acute and chronic respiratory failure with hypoxia: Secondary | ICD-10-CM | POA: Insufficient documentation

## 2014-04-16 DIAGNOSIS — K5900 Constipation, unspecified: Secondary | ICD-10-CM

## 2014-04-16 DIAGNOSIS — D72829 Elevated white blood cell count, unspecified: Secondary | ICD-10-CM | POA: Insufficient documentation

## 2014-04-16 LAB — CBC
HCT: 23.3 % — ABNORMAL LOW (ref 36.0–46.0)
Hemoglobin: 7.8 g/dL — ABNORMAL LOW (ref 12.0–15.0)
MCH: 30.5 pg (ref 26.0–34.0)
MCHC: 33.5 g/dL (ref 30.0–36.0)
MCV: 91 fL (ref 78.0–100.0)
PLATELETS: 148 10*3/uL — AB (ref 150–400)
RBC: 2.56 MIL/uL — AB (ref 3.87–5.11)
RDW: 20.8 % — ABNORMAL HIGH (ref 11.5–15.5)
WBC: 24.8 10*3/uL — AB (ref 4.0–10.5)

## 2014-04-16 LAB — BASIC METABOLIC PANEL
ANION GAP: 14 (ref 5–15)
BUN: 20 mg/dL (ref 6–23)
CHLORIDE: 102 meq/L (ref 96–112)
CO2: 21 meq/L (ref 19–32)
CREATININE: 0.47 mg/dL — AB (ref 0.50–1.10)
Calcium: 9.4 mg/dL (ref 8.4–10.5)
GFR calc Af Amer: >90 mL/min (ref >90)
GFR calc non Af Amer: >90 mL/min (ref >90)
Glucose, Bld: 125 mg/dL — ABNORMAL HIGH (ref 70–99)
Potassium: 3.7 mEq/L (ref 3.7–5.3)
Sodium: 137 mEq/L (ref 137–147)

## 2014-04-16 LAB — CULTURE, BLOOD (ROUTINE X 2): Culture: NO GROWTH

## 2014-04-16 LAB — PREPARE RBC (CROSSMATCH)

## 2014-04-16 LAB — ABO/RH: ABO/RH(D): O POS

## 2014-04-16 MED ORDER — SODIUM CHLORIDE 0.9 % IJ SOLN
10.0000 mL | Freq: Two times a day (BID) | INTRAMUSCULAR | Status: DC
  Administered 2014-04-16: 10 mL

## 2014-04-16 MED ORDER — SODIUM CHLORIDE 0.9 % IV SOLN: Freq: Once | INTRAVENOUS | Status: DC

## 2014-04-16 MED ORDER — SODIUM CHLORIDE 0.9 % IJ SOLN
10.0000 mL | INTRAMUSCULAR | Status: DC | PRN
  Administered 2014-04-16: 30 mL
  Administered 2014-04-16: 20 mL
  Administered 2014-04-17 – 2014-04-18 (×3): 10 mL
  Filled 2014-04-16 (×4): qty 40

## 2014-04-16 NOTE — Plan of Care (Signed)
Problem: Phase I Progression Outcomes Goal: Pain controlled with appropriate interventions Outcome: Completed/Met Date Met:  04/16/14     

## 2014-04-16 NOTE — Progress Notes (Signed)
[facility] PROGRESS NOTE  [name] AXK:[id number] DOB: 1955-09-16 DOA: 04/10/2014 PCP: Lorayne Marek, MD  Brief Summary  [name] is a 58 y.o. female history of metastatic breast cancer, COPD and hypertension was referred to the ER from the [facility] after patient was found to be weak and confused. Patient had gone to her regular chemotherapy and was found to be confused and weak.  In the ER, she was febrile and complaining of right lower chest pain, productive cough, and possible abdominal pain.  Chest x-ray demonstrated bilateral atelectasis versus infiltrates and CT abdomen and pelvis was unremarkable. Patient has been started on empiric antibiotics for possible sepsis and admitted to stepdown.  Assessment/Plan Sepsis,  probably from pneumonia .   -  Continue vancomycin day 7 and cefepime  6. Received flagyl for 4 days. Flagyl discontinue 10-30.  -  CT angio negative for PE on admission.   -  blood cultures no growth to date.  -  Legionella and strep pneumo ag negative.  -  Flu PCR negative -  Sputum culture if able -  UA neg - CT abd/pelvis neg 10-26.  -WBC increased 10-31  but she was started on Solumedrol 10-30.  -continue with same regimen of antibiotics.  -WBC pending.   Acute Hypoxic respiratory failure; multifactorial: PNA, COPD, metastasis diseases.  -BIPAP PRN.  -Received IV lasix overnight 10-28.  -NSL fluids.  -CT angio negative for PE. Stable mediastinal adenopathy and right suprahilar mass is noted consistent with metastatic disease. -increase oxygen requirement on 10-29.  received 60 mg lasix 10-30. Tachycardia got worse.  -Chest x ray 10-31; Rapid improvement in the bilateral pulmonary edema. Diminished small  bilateral pleural effusions. -stable.   Left DVT Gastrocnemius Vein: Continue with Lovenox. Pharmacy to dose.   Sinus Tachycardia; stable. Asymptomatic. Responded to fluids.   Chest pain; on and off, since admission. Troponin  negative. Continue with  protonix. CT angio on admission negative,   Constipation, Ileus.; resolved. Has had multiple BM.  Tolerating regular diet.  KUB 10-30. Mild gaseous distended small bowel loops probable ileus. Stool in right colon. Moderate colonic gaseous distension. Continue with miralax, docusate. Dulcolax suppository PRN.   Acute encephalopathy due to sepsis and high fever - will resume flexeril PRN, careful with oversedation.  -  Treat infection  Generalized weakness from infection and dehydration.  IVF and abx -  PT/OT consults  Metastatic breast cancer -  Dr. Jana Hakim on rounding list  Hypercalcemia likely secondary to dehydration and already improved with IVF  HTN with borderline low BPs -  Hold BP medications  COPD;  -  Continue flovent -  duonebs with albuterol prn  Leukocytosis due to sepsis, continue with  Abx.  Anemia of chronic disease, hemoglobin trending down with IVF -  Transfuse for hgb < 7 or symptomatic anemia - Hb stable at 8.   Metabolic acidosis with mild gap likely due to ketosis and dehydration.  Lactic acid was normal.  Improving with IVF Resolved.    Sick euthyroid -  Repeat TSH in 4-6 weeks  Diet:  regular Access:  port IVF:  yes Proph:  lovenox  Code Status: full Family Communication: patient alone and family member at bedside.  Disposition Plan: continue stepdown.    Consultants:  Oncology added to rounding list  Procedures:  10/26 CT head unremarkable  10/26 CT abd/pelvis neg  10/26 CXR bibasilar PNA vs. aletelectasis  Antibiotics:  Vancomycin 10/26 >>  Aztreonam 10/26 > 10/27  Levofloxacin 10/26  Cefepime 10/27 >>  HPI/Subjective: She is breathing better. Feeling better. Still with chest pain some times. She relates she has had this pain since admission and was having this pain at home.  Has had multiple BM since yesterday.   Objective: Filed Vitals:   04/15/14 2200 04/16/14 0200 04/16/14 0400 04/16/14  0600  BP: 138/68 132/67 147/72 139/66  Pulse: 107 96 120 110  Temp: 97.9 F (36.6 C) 98.1 F (36.7 C) 98.4 F (36.9 C) 98.2 F (36.8 C)  TempSrc: Oral Oral    Resp: 24 23 21 23   Height:      Weight:      SpO2: 100% 97% 91% 96%    Intake/Output Summary (Last 24 hours) at 04/16/14 0743 Last data filed at 04/16/14 0600  Gross per 24 hour  Intake   2550 ml  Output   1115 ml  Net   1435 ml   Filed Weights   04/12/14 0400 04/14/14 0500 04/15/14 0600  Weight: 52.4 kg (115 lb 8.3 oz) 53.9 kg (118 lb 13.3 oz) 52.2 kg (115 lb 1.3 oz)    Exam:   General:  Thin BF, in no distress. .   Cardiovascular:  Tachycardic RR, nl S1, S2.   Respiratory:  CTAB, tachypneic.   Abdomen:   NABS, soft, mildly distended and mild tenderness.   MSK:  No edema.   Neuro:  Alert and oriented.   Data Reviewed: Basic Metabolic Panel:  Recent Labs Lab 04/12/14 0501 04/13/14 0545 04/14/14 0135 04/14/14 1640 04/15/14 0505  NA 140 138 137 137 138  K 3.6* 3.2* 4.4 4.0 3.9  CL 104 103 103 99 100  CO2 19 20 20 22 24   GLUCOSE 176* 137* 106* 104* 142*  BUN 13 17 16 13 16   CREATININE 0.75 0.75 0.69 0.66 0.49*  CALCIUM 10.1 9.6 9.6 10.6* 10.4  MG 2.0  --   --  1.7  --   PHOS 6.2*  --   --   --   --    Liver Function Tests:  Recent Labs Lab 04/10/14 1428 04/10/14 1537 04/11/14 0435 04/11/14 1750  AST 90* 87* 57* 66*  ALT 19 15 11 11   ALKPHOS 107 108 85 92  BILITOT 0.49 0.4 0.2* 0.3  PROT 7.9 7.9 6.2 6.6  ALBUMIN 3.2* 3.4* 2.5* 2.5*    Recent Labs Lab 04/10/14 1537  LIPASE 9*    Recent Labs Lab 04/10/14 1537  AMMONIA 44   CBC:  Recent Labs Lab 04/10/14 1427 04/10/14 1537  04/11/14 0435 04/11/14 1750 04/12/14 0501 04/13/14 0545 04/14/14 0135 04/15/14 0505  WBC 16.7* 16.4*  < > 13.0* 13.5* 14.4* 19.1* 17.0* 20.8*  NEUTROABS 13.6* 13.3*  --  10.8* 11.5*  --   --   --   --   HGB 11.2* 10.7*  < > 8.4* 8.8* 8.1* 8.1* 8.1* 8.1*  HCT 33.7* 32.0*  < > 25.8* 27.1* 24.2*  24.4* 24.4* 24.8*  MCV 92.3 92.0  < > 94.2 92.5 91.0 91.0 92.4 91.9  PLT 222 227  < > 180 211 187 191 173 155  < > = values in this interval not displayed. Cardiac Enzymes:  Recent Labs Lab 04/10/14 1537 04/10/14 2300 04/13/14 1330 04/13/14 1910 04/14/14 0135  TROPONINI <0.30 <0.30 <0.30 <0.30 <0.30   BNP (last 3 results)  Recent Labs  04/10/14 1537  PROBNP 298.3*   CBG: No results for input(s): GLUCAP in the last 168 hours.  Recent Results (from the past  240 hour(s))  TECHNOLOGIST REVIEW     Status: None   Collection Time: 04/10/14  2:27 PM  Result Value Ref Range Status   Technologist Review 2% Nrbcs, 3% metamyelocytes  Final  Blood Culture (routine x 2)     Status: None (Preliminary result)   Collection Time: 04/10/14  3:20 PM  Result Value Ref Range Status   Specimen Description BLOOD LEFT CHEST  Final   Special Requests BOTTLES DRAWN AEROBIC AND ANAEROBIC 5 CC EA  Final   Culture  Setup Time   Final    04/10/2014 21:17 Performed at [facility]   Culture   Final           BLOOD CULTURE RECEIVED NO GROWTH TO DATE CULTURE WILL BE HELD FOR 5 DAYS BEFORE ISSUING A FINAL NEGATIVE REPORT Performed at [facility]   Report Status PENDING  Incomplete  Blood Culture (routine x 2)     Status: None (Preliminary result)   Collection Time: 04/10/14  3:25 PM  Result Value Ref Range Status   Specimen Description BLOOD LEFT ARM  Final   Special Requests BOTTLES DRAWN AEROBIC AND ANAEROBIC 8CC  Final   Culture  Setup Time   Final    04/11/2014 03:55 Performed at [facility]   Culture   Final           BLOOD CULTURE RECEIVED NO GROWTH TO DATE CULTURE WILL BE HELD FOR 5 DAYS BEFORE ISSUING A FINAL NEGATIVE REPORT Performed at [facility]   Report Status PENDING  Incomplete  Urine culture     Status: None   Collection Time: 04/10/14  3:37 PM  Result Value Ref Range Status   Specimen Description URINE, CATHETERIZED  Final   Special Requests  NONE  Final   Culture  Setup Time   Final    04/10/2014 21:31 Performed at [facility] Performed at [facility]  Final   Culture NO GROWTH Performed at [facility]  Final   Report Status 04/11/2014 FINAL  Final  MRSA PCR Screening     Status: None   Collection Time: 04/10/14 10:20 PM  Result Value Ref Range Status   MRSA by PCR NEGATIVE NEGATIVE Final    Comment:        The GeneXpert MRSA Assay (FDA approved for NASAL specimens only), is one component of a comprehensive MRSA colonization surveillance program. It is not intended to diagnose MRSA infection nor to guide or monitor treatment for MRSA infections.  Culture, blood (routine x 2)     Status: None (Preliminary result)   Collection Time: 04/11/14  5:50 PM  Result Value Ref Range Status   Specimen Description BLOOD LEFT ARM  Final   Special Requests BOTTLES DRAWN AEROBIC AND ANAEROBIC  10 CC  Final   Culture  Setup Time   Final    04/11/2014 22:33 Performed at [facility]   Culture   Final           BLOOD CULTURE RECEIVED NO GROWTH TO DATE CULTURE WILL BE HELD FOR 5 DAYS BEFORE ISSUING A FINAL NEGATIVE REPORT Performed at [facility]   Report Status PENDING  Incomplete  Culture, blood (routine x 2)     Status: None (Preliminary result)   Collection Time: 04/11/14  5:59 PM  Result Value Ref Range Status   Specimen Description BLOOD LEFT ARM  Final   Special Requests BOTTLES DRAWN  AEROBIC AND ANAEROBIC  10 CC  Final   Culture  Setup Time   Final    04/11/2014 22:33 Performed at [facility]   Culture   Final           BLOOD CULTURE RECEIVED NO GROWTH TO DATE CULTURE WILL BE HELD FOR 5 DAYS BEFORE ISSUING A FINAL NEGATIVE REPORT Performed at [facility]   Report Status PENDING  Incomplete     Studies: Dg Chest Port 1 View  04/15/2014   CLINICAL DATA:  Hypoxemia.  EXAM: PORTABLE CHEST - 1 VIEW  COMPARISON:  04/14/2014 and  04/13/2014 and CT scan dated 04/11/2014  FINDINGS: Power port in place, unchanged. There has been rapid clearing of the bilateral infiltrates and improvement in the bilateral small effusions. The findings probably represent pulmonary edema.  Spiculated 3 cm mass is noted in the right upper lobe. Heart size and vascularity are normal.  IMPRESSION: Rapid improvement in the bilateral pulmonary edema. Diminished small bilateral pleural effusions.   Electronically Signed   By: Rozetta Nunnery M.D.   On: 04/15/2014 05:37    Scheduled Meds: . antiseptic oral rinse  7 mL Mouth Rinse q12n4p  . arformoterol  15 mcg Nebulization BID  . budesonide  0.5 mg Nebulization Q12H  . ceFEPime (MAXIPIME) IV  1 g Intravenous Q8H  . chlorhexidine  15 mL Mouth Rinse BID  . enoxaparin (LOVENOX) injection  1 mg/kg Subcutaneous Q12H  . methylPREDNISolone (SOLU-MEDROL) injection  60 mg Intravenous Q12H  . pantoprazole (PROTONIX) IV  40 mg Intravenous Daily  . polyethylene glycol  17 g Oral BID  . senna-docusate  1 tablet Oral BID   Continuous Infusions: . sodium chloride 75 mL/hr at 04/15/14 1500    Principal Problem:   Sepsis Active Problems:   COPD (chronic obstructive pulmonary disease)   Breast cancer metastasized to bone   Hypertension   Septic shock   Acute on chronic respiratory failure    Time spent: 30 min    Dashanique Brownstein, [facility] Pager [phone]. If 7PM-7AM, please contact night-coverage at [email or web address], password [facility] 04/16/2014, 7:43 AM  LOS: 6 days

## 2014-04-16 NOTE — Progress Notes (Signed)
Report given to Manuela Schwartz, RN on 4th floor. Will give 10 am medications and then transport patient up. Patient vital signs stable, in no acute distress, and no complaints of pain.

## 2014-04-16 NOTE — Plan of Care (Signed)
Problem: Discharge Progression Outcomes Goal: Tolerating diet Outcome: Completed/Met Date Met:  04/16/14

## 2014-04-16 NOTE — Progress Notes (Signed)
Difficult time getting labs from port. IV team called and will be at bedside shortly.

## 2014-04-17 ENCOUNTER — Encounter (HOSPITAL_COMMUNITY): Payer: Self-pay | Admitting: Radiology

## 2014-04-17 ENCOUNTER — Other Ambulatory Visit: Payer: Medicaid Other

## 2014-04-17 ENCOUNTER — Ambulatory Visit: Payer: Medicaid Other

## 2014-04-17 ENCOUNTER — Ambulatory Visit: Payer: Medicaid Other | Admitting: Adult Health

## 2014-04-17 ENCOUNTER — Ambulatory Visit: Payer: Medicaid Other | Admitting: Radiation Oncology

## 2014-04-17 DIAGNOSIS — C50911 Malignant neoplasm of unspecified site of right female breast: Secondary | ICD-10-CM

## 2014-04-17 DIAGNOSIS — C7931 Secondary malignant neoplasm of brain: Secondary | ICD-10-CM

## 2014-04-17 DIAGNOSIS — C7800 Secondary malignant neoplasm of unspecified lung: Secondary | ICD-10-CM

## 2014-04-17 DIAGNOSIS — C7951 Secondary malignant neoplasm of bone: Secondary | ICD-10-CM

## 2014-04-17 DIAGNOSIS — J189 Pneumonia, unspecified organism: Secondary | ICD-10-CM

## 2014-04-17 LAB — TYPE AND SCREEN
ABO/RH(D): O POS
Antibody Screen: NEGATIVE
Unit division: 0

## 2014-04-17 LAB — CBC
HEMATOCRIT: 27.7 % — AB (ref 36.0–46.0)
HEMOGLOBIN: 9.2 g/dL — AB (ref 12.0–15.0)
MCH: 30.3 pg (ref 26.0–34.0)
MCHC: 33.2 g/dL (ref 30.0–36.0)
MCV: 91.1 fL (ref 78.0–100.0)
Platelets: 125 10*3/uL — ABNORMAL LOW (ref 150–400)
RBC: 3.04 MIL/uL — AB (ref 3.87–5.11)
RDW: 19.5 % — AB (ref 11.5–15.5)
WBC: 19.9 10*3/uL — AB (ref 4.0–10.5)

## 2014-04-17 LAB — CULTURE, BLOOD (ROUTINE X 2)
CULTURE: NO GROWTH
CULTURE: NO GROWTH
Culture: NO GROWTH

## 2014-04-17 LAB — BASIC METABOLIC PANEL
Anion gap: 15 (ref 5–15)
BUN: 24 mg/dL — AB (ref 6–23)
CHLORIDE: 103 meq/L (ref 96–112)
CO2: 20 meq/L (ref 19–32)
CREATININE: 0.54 mg/dL (ref 0.50–1.10)
Calcium: 9.3 mg/dL (ref 8.4–10.5)
GFR calc non Af Amer: >90 mL/min (ref >90)
GLUCOSE: 179 mg/dL — AB (ref 70–99)
POTASSIUM: 4.2 meq/L (ref 3.7–5.3)
Sodium: 138 mEq/L (ref 137–147)

## 2014-04-17 MED ORDER — PANTOPRAZOLE SODIUM 40 MG PO TBEC
40.0000 mg | DELAYED_RELEASE_TABLET | Freq: Every day | ORAL | Status: DC
  Administered 2014-04-18 – 2014-04-19 (×2): 40 mg via ORAL
  Filled 2014-04-17 (×2): qty 1

## 2014-04-17 MED ORDER — BUDESONIDE 0.5 MG/2ML IN SUSP
0.5000 mg | Freq: Two times a day (BID) | RESPIRATORY_TRACT | Status: DC
  Administered 2014-04-17 – 2014-04-18 (×4): 0.5 mg via RESPIRATORY_TRACT
  Filled 2014-04-17 (×4): qty 2

## 2014-04-17 MED ORDER — PREDNISONE 20 MG PO TABS
40.0000 mg | ORAL_TABLET | Freq: Every day | ORAL | Status: DC
  Administered 2014-04-18 – 2014-04-19 (×2): 40 mg via ORAL
  Filled 2014-04-17 (×4): qty 2

## 2014-04-17 NOTE — Progress Notes (Signed)

## 2014-04-17 NOTE — Progress Notes (Signed)
[facility] PROGRESS NOTE  Saara Kijowski MBT:[id number] DOB: [birth year] DOA: 04/10/2014 PCP: Lorayne Marek, MD  Brief Summary  [name] is a 58 y.o. female history of metastatic breast cancer, COPD and hypertension was referred to the ER from the [facility] after patient was found to be weak and confused. Patient had gone to her regular chemotherapy and was found to be confused and weak.  In the ER, she was febrile and complaining of right lower chest pain, productive cough, and possible abdominal pain.  Chest x-ray demonstrated bilateral atelectasis versus infiltrates and CT abdomen and pelvis was unremarkable. Patient has been started on empiric antibiotics for possible sepsis and admitted to stepdown.  Assessment/Plan Sepsis,  probably from pneumonia .   -  Received  vancomycin for 7 and cefepime  7. Received flagyl for 4 days. Flagyl discontinue 10-30.  -  CT angio negative for PE on admission.   -  blood cultures no growth to date.  -  Legionella and strep pneumo ag negative.  -  Flu PCR negative -  Sputum culture if able -  UA neg - CT abd/pelvis neg 10-26.  -WBC increased 10-31  but she was started on Solumedrol 10-30.  -continue with same regimen of antibiotics.  -WBC trending down, patient on steroids. Afebrile.   Acute Hypoxic respiratory failure; multifactorial: PNA, COPD, metastasis diseases.  -BIPAP PRN.  -Received IV lasix overnight 10-28.  -NSL fluids.  -CT angio negative for PE. Stable mediastinal adenopathy and right suprahilar mass is noted consistent with metastatic disease. -increase oxygen requirement on 10-29.  received 60 mg lasix 10-30. Tachycardia got worse.  -Chest x ray 10-31; Rapid improvement in the bilateral pulmonary edema. Diminished small  bilateral pleural effusions. -stable. Improved. Evaluation for home oxygen.  Prednisone taper.   Left DVT Gastrocnemius Vein: Continue with Lovenox. Pharmacy to dose. Patient agree to continue  with lovenox.   Sinus Tachycardia; stable. Asymptomatic. Responded to fluids.   Chest pain; on and off, since admission. Troponin negative. Continue with  protonix. CT angio on admission negative,   Constipation, Ileus.; resolved. Has had multiple BM.  Tolerating regular diet.  KUB 10-30. Mild gaseous distended small bowel loops probable ileus. Stool in right colon. Moderate colonic gaseous distension. Continue with miralax, docusate. Dulcolax suppository PRN.   Acute encephalopathy due to sepsis and high fever - will resume flexeril PRN, careful with oversedation.  -  Treat infection  Generalized weakness from infection and dehydration.  IVF and abx -  PT/OT consults  Metastatic breast cancer -  Dr. Jana Hakim on rounding list  Hypercalcemia likely secondary to dehydration and already improved with IVF  HTN with borderline low BPs -  Hold BP medications  COPD;  -  Continue flovent -  duonebs with albuterol prn  Leukocytosis due to sepsis, continue with  Abx.  Anemia of chronic disease, hemoglobin trending down with IVF -  Transfuse for hgb < 7 or symptomatic anemia - Hb stable at 9. Received one unit PRBC.   Metabolic acidosis with mild gap likely due to ketosis and dehydration.  Lactic acid was normal.  Improving with IVF Resolved.    Sick euthyroid -  Repeat TSH in 4-6 weeks  Diet:  regular Access:  port IVF:  yes Proph:  lovenox  Code Status: full Family Communication: patient alone and family member at bedside.  Disposition Plan: PT evaluation. Home vs SNF tomorrow.    Consultants:  Oncology added to rounding list  Procedures:  10/26 CT  head unremarkable  10/26 CT abd/pelvis neg  10/26 CXR bibasilar PNA vs. aletelectasis  Antibiotics:  Vancomycin 10/26 >>  Aztreonam 10/26 > 10/27  Levofloxacin 10/26   Cefepime 10/27 >>  HPI/Subjective: She is feeling better no significant chest pain or dyspnea.   Objective: Filed Vitals:   04/16/14  1750 04/16/14 2213 04/17/14 0419 04/17/14 1117  BP: 132/67 125/60 129/65   Pulse: 84 91 80   Temp: 98.7 F (37.1 C) 97.7 F (36.5 C) 98.2 F (36.8 C)   TempSrc: Oral Oral Oral   Resp: 22 20 20    Height:      Weight:      SpO2: 98% 97% 97% 91%    Intake/Output Summary (Last 24 hours) at 04/17/14 1328 Last data filed at 04/17/14 1244  Gross per 24 hour  Intake   2715 ml  Output    900 ml  Net   1815 ml   Filed Weights   04/12/14 0400 04/14/14 0500 04/15/14 0600  Weight: 52.4 kg (115 lb 8.3 oz) 53.9 kg (118 lb 13.3 oz) 52.2 kg (115 lb 1.3 oz)    Exam:   General:  Thin BF, in no distress. .   Cardiovascular:  Tachycardic RR, nl S1, S2.   Respiratory:  CTAB, tachypneic.   Abdomen:   NABS, soft, mildly distended and mild tenderness.   MSK:  No edema.   Neuro:  Alert and oriented.   Data Reviewed: Basic Metabolic Panel:  Recent Labs Lab 04/12/14 0501  04/14/14 0135 04/14/14 1640 04/15/14 0505 04/16/14 0845 04/17/14 0455  NA 140  < > 137 137 138 137 138  K 3.6*  < > 4.4 4.0 3.9 3.7 4.2  CL 104  < > 103 99 100 102 103  CO2 19  < > 20 22 24 21 20   GLUCOSE 176*  < > 106* 104* 142* 125* 179*  BUN 13  < > 16 13 16 20  24*  CREATININE 0.75  < > 0.69 0.66 0.49* 0.47* 0.54  CALCIUM 10.1  < > 9.6 10.6* 10.4 9.4 9.3  MG 2.0  --   --  1.7  --   --   --   PHOS 6.2*  --   --   --   --   --   --   < > = values in this interval not displayed. Liver Function Tests:  Recent Labs Lab 04/10/14 1428 04/10/14 1537 04/11/14 0435 04/11/14 1750  AST 90* 87* 57* 66*  ALT 19 15 11 11   ALKPHOS 107 108 85 92  BILITOT 0.49 0.4 0.2* 0.3  PROT 7.9 7.9 6.2 6.6  ALBUMIN 3.2* 3.4* 2.5* 2.5*    Recent Labs Lab 04/10/14 1537  LIPASE 9*    Recent Labs Lab 04/10/14 1537  AMMONIA 44   CBC:  Recent Labs Lab 04/10/14 1427 04/10/14 1537  04/11/14 0435 04/11/14 1750  04/13/14 0545 04/14/14 0135 04/15/14 0505 04/16/14 0845 04/17/14 0455  WBC 16.7* 16.4*  < > 13.0*  13.5*  < > 19.1* 17.0* 20.8* 24.8* 19.9*  NEUTROABS 13.6* 13.3*  --  10.8* 11.5*  --   --   --   --   --   --   HGB 11.2* 10.7*  < > 8.4* 8.8*  < > 8.1* 8.1* 8.1* 7.8* 9.2*  HCT 33.7* 32.0*  < > 25.8* 27.1*  < > 24.4* 24.4* 24.8* 23.3* 27.7*  MCV 92.3 92.0  < > 94.2 92.5  < > 91.0 92.4  91.9 91.0 91.1  PLT 222 227  < > 180 211  < > 191 173 155 148* 125*  < > = values in this interval not displayed. Cardiac Enzymes:  Recent Labs Lab 04/10/14 1537 04/10/14 2300 04/13/14 1330 04/13/14 1910 04/14/14 0135  TROPONINI <0.30 <0.30 <0.30 <0.30 <0.30   BNP (last 3 results)  Recent Labs  04/10/14 1537  PROBNP 298.3*   CBG: No results for input(s): GLUCAP in the last 168 hours.  Recent Results (from the past 240 hour(s))  TECHNOLOGIST REVIEW     Status: None   Collection Time: 04/10/14  2:27 PM  Result Value Ref Range Status   Technologist Review 2% Nrbcs, 3% metamyelocytes  Final  Blood Culture (routine x 2)     Status: None   Collection Time: 04/10/14  3:20 PM  Result Value Ref Range Status   Specimen Description BLOOD LEFT CHEST  Final   Special Requests BOTTLES DRAWN AEROBIC AND ANAEROBIC 5 CC EA  Final   Culture  Setup Time   Final    04/10/2014 21:17 Performed at [facility]    Culture   Final    NO GROWTH 5 DAYS Performed at [facility]    Report Status 04/16/2014 FINAL  Final  Blood Culture (routine x 2)     Status: None   Collection Time: 04/10/14  3:25 PM  Result Value Ref Range Status   Specimen Description BLOOD LEFT ARM  Final   Special Requests BOTTLES DRAWN AEROBIC AND ANAEROBIC 8CC  Final   Culture  Setup Time   Final    04/11/2014 03:55 Performed at [place]   Final    NO GROWTH 5 DAYS Performed at [facility]    Report Status 04/17/2014 FINAL  Final  Urine culture     Status: None   Collection Time: 04/10/14  3:37 PM  Result Value Ref Range Status   Specimen Description URINE, CATHETERIZED  Final    Special Requests NONE  Final   Culture  Setup Time   Final    04/10/2014 21:31 Performed at [place] Performed at [facility]  Final   Culture NO GROWTH Performed at [facility]  Final   Report Status 04/11/2014 FINAL  Final  MRSA PCR Screening     Status: None   Collection Time: 04/10/14 10:20 PM  Result Value Ref Range Status   MRSA by PCR NEGATIVE NEGATIVE Final    Comment:        The GeneXpert MRSA Assay (FDA approved for NASAL specimens only), is one component of a comprehensive MRSA colonization surveillance program. It is not intended to diagnose MRSA infection nor to guide or monitor treatment for MRSA infections.  Culture, blood (routine x 2)     Status: None   Collection Time: 04/11/14  5:50 PM  Result Value Ref Range Status   Specimen Description BLOOD LEFT ARM  Final   Special Requests BOTTLES DRAWN AEROBIC AND ANAEROBIC  10 CC  Final   Culture  Setup Time   Final    04/11/2014 22:33 Performed at [place]   Final    NO GROWTH 5 DAYS Performed at [facility]    Report Status 04/17/2014 FINAL  Final  Culture, blood (routine x 2)     Status: None   Collection Time: 04/11/14  5:59 PM  Result  Value Ref Range Status   Specimen Description BLOOD LEFT ARM  Final   Special Requests BOTTLES DRAWN AEROBIC AND ANAEROBIC  10 CC  Final   Culture  Setup Time   Final    04/11/2014 22:33 Performed at [facility]    Culture   Final    NO GROWTH 5 DAYS Note: Culture results may be compromised due to an excessive volume of blood received in culture bottles. Performed at [facility]    Report Status 04/17/2014 FINAL  Final     Studies: No results found.  Scheduled Meds: . sodium chloride   Intravenous Once  . antiseptic oral rinse  7 mL Mouth Rinse q12n4p  . arformoterol  15 mcg Nebulization BID  . budesonide (PULMICORT) nebulizer solution  0.5 mg Nebulization  Q12H  . ceFEPime (MAXIPIME) IV  1 g Intravenous Q8H  . chlorhexidine  15 mL Mouth Rinse BID  . enoxaparin (LOVENOX) injection  1 mg/kg Subcutaneous Q12H  . [START ON 04/18/2014] pantoprazole  40 mg Oral Daily  . polyethylene glycol  17 g Oral BID  . [START ON 04/18/2014] predniSONE  40 mg Oral Q breakfast  . sodium chloride  10-40 mL Intracatheter Q12H   Continuous Infusions:    Principal Problem:   Sepsis Active Problems:   COPD (chronic obstructive pulmonary disease)   Breast cancer metastasized to bone   Hypertension   Septic shock   Acute on chronic respiratory failure   Acute on chronic respiratory failure with hypoxia   Constipation   Leukocytosis    Time spent: 30 min    Onesimo Lingard, [facility] Pager [phone]. If 7PM-7AM, please contact night-coverage at [email or web address], password [facility] 04/17/2014, 1:28 PM  LOS: 7 days

## 2014-04-17 NOTE — Evaluation (Addendum)
Physical Therapy Evaluation Patient Details Name: [name] MRN: [id number] DOB: 1955/08/07 Today's Date: 04/17/2014   SATURATION QUALIFICATIONS: (This note is used to comply with regulatory documentation for home oxygen)  Patient Saturations on Room Air at Rest = 91%  Patient Saturations on Room Air while Ambulating = 86%  Patient Saturations on 2 Liters of oxygen while Ambulating = 97%    History of Present Illness  58 yo female admitted with sepsis, weakness, confusion, chest pain. Hx of met breast cancer, COPD, ETOH abuse. Pt lives alone  Clinical Impression  On eval, pt was Min guard assist for mobility-able to ambulate ~250 feet, holding onto IV pole. Tolerated activity well. Dyspnea 2/4 with activity. A couple of rest breaks needed during session. May benefit from increased supervision/assist initially once home.     Follow Up Recommendations Home health PT;Home Health OT;Home Health Aide; Supervision - Intermittent    Equipment Recommendations  None recommended by PT (pt states she has a cane available for use at home if needed)    Recommendations for Other Services OT consult     Precautions / Restrictions Precautions Precautions: Fall Precaution Comments: O2. monitor sats Restrictions Weight Bearing Restrictions: No      Mobility  Bed Mobility Overal bed mobility: Needs Assistance Bed Mobility: Supine to Sit     Supine to sit: HOB elevated;Supervision     General bed mobility comments: supervision for safety. Increased time.   Transfers Overall transfer level: Needs assistance   Transfers: Sit to/from Stand Sit to Stand: Min guard         General transfer comment: close guard for safety.   Ambulation/Gait Ambulation/Gait assistance: Min guard Ambulation Distance (Feet): 250 Feet (250'x1, 15'x1) Assistive device:  (pt held onto IV pole) Gait Pattern/deviations: Step-through pattern;Decreased stride length     General Gait Details: Walked  ~150 feet on RA-sats dropped to 86%. Walked remaining ~100 feet on 2L O2-sats 97% on 2L. Tolerated activity well. Dyspnea 2/4. After using restroom, pt walked another ~15 feet without UE support.   Stairs            Wheelchair Mobility    Modified Rankin (Stroke Patients Only)       Balance Overall balance assessment: Needs assistance         Standing balance support: No upper extremity supported;During functional activity Standing balance-Leahy Scale: Fair                               Pertinent Vitals/Pain Pain Assessment: 0-10 Pain Score: 2  Pain Location: trunk  Pain Intervention(s): Premedicated before session    Home Living Family/patient expects to be discharged to:: Private residence Living Arrangements: Alone   Type of Home: Apartment Home Access: Stairs to enter Entrance Stairs-Rails: None Entrance Stairs-Number of Steps: 4 Home Layout: One level Home Equipment: Cane - single point      Prior Function Level of Independence: Independent         Comments: has cane but not using     Hand Dominance        Extremity/Trunk Assessment   Upper Extremity Assessment: Generalized weakness           Lower Extremity Assessment: Generalized weakness      Cervical / Trunk Assessment: Normal  Communication   Communication: No difficulties  Cognition Arousal/Alertness: Awake/alert Behavior During Therapy: WFL for tasks assessed/performed Overall Cognitive Status: Within Functional Limits for tasks assessed  General Comments      Exercises        Assessment/Plan    PT Assessment Patient needs continued PT services  PT Diagnosis Difficulty walking;Generalized weakness   PT Problem List Decreased strength;Decreased activity tolerance;Decreased balance;Decreased mobility  PT Treatment Interventions Gait training;Functional mobility training;Therapeutic activities;Therapeutic  exercise;Patient/family education;Balance training   PT Goals (Current goals can be found in the Care Plan section) Acute Rehab PT Goals Patient Stated Goal: home tomorrow PT Goal Formulation: With patient Time For Goal Achievement: 05/01/14 Potential to Achieve Goals: Good    Frequency Min 3X/week   Barriers to discharge        Co-evaluation               End of Session Equipment Utilized During Treatment: Gait belt;Oxygen Activity Tolerance: Patient tolerated treatment well Patient left: in chair;with call bell/phone within reach           Time: 1138-1217 PT Time Calculation (min): 39 min   Charges:   PT Evaluation $Initial PT Evaluation Tier I: 1 Procedure PT Treatments $Gait Training: 23-37 mins   PT G Codes:          Weston Anna, MPT Pager: [phone]

## 2014-04-17 NOTE — Progress Notes (Signed)
[name]   DOB:1956/02/06   QZ#:[id number]   FHL#:[id number]  Subjective: "doing much better." Denies cough, phlegm, pleurisy. Not getting OOB much--"am I supposed to?" BMs " not a problem." Daughter in room   Objective: middle aged [place] woman examined in bed Filed Vitals:   04/17/14 0419  BP: 129/65  Pulse: 80  Temp: 98.2 F (36.8 C)  Resp: 20    Body mass index is 23.23 kg/(m^2).  Intake/Output Summary (Last 24 hours) at 04/17/14 0748 Last data filed at 04/17/14 0600  Gross per 24 hour  Intake   2600 ml  Output    960 ml  Net   1640 ml     No peripheral adenopathy  Lungs no rales or rhonchi-- fair excursion bilaterally  Heart regular rate and rhythm  Abdomen benign  Neuro nonfocal  Breast exam: deferred  CBG (last 3)  No results for input(s): GLUCAP in the last 72 hours.   Labs:  Lab Results  Component Value Date   WBC 19.9* 04/17/2014   HGB 9.2* 04/17/2014   HCT 27.7* 04/17/2014   MCV 91.1 04/17/2014   PLT 125* 04/17/2014   NEUTROABS 11.5* 04/11/2014    @LASTCHEMISTRY @  Urine Studies No results for input(s): UHGB, CRYS in the last 72 hours.  Invalid input(s): UACOL, UAPR, USPG, UPH, UTP, UGL, [place], UBIL, UNIT, UROB, [place], UEPI, UWBC, [place], [place], [place], [place], [place]  Basic Metabolic Panel:  Recent Labs Lab 04/12/14 0501  04/14/14 0135 04/14/14 1640 04/15/14 0505 04/16/14 0845 04/17/14 0455  NA 140  < > 137 137 138 137 138  K 3.6*  < > 4.4 4.0 3.9 3.7 4.2  CL 104  < > 103 99 100 102 103  CO2 19  < > 20 22 24 21 20   GLUCOSE 176*  < > 106* 104* 142* 125* 179*  BUN 13  < > 16 13 16 20  24*  CREATININE 0.75  < > 0.69 0.66 0.49* 0.47* 0.54  CALCIUM 10.1  < > 9.6 10.6* 10.4 9.4 9.3  MG 2.0  --   --  1.7  --   --   --   PHOS 6.2*  --   --   --   --   --   --   < > = values in this interval not displayed. GFR Estimated Creatinine Clearance: 55.9 mL/min (by C-G formula based on Cr of 0.54). Liver Function Tests:  Recent Labs Lab  04/10/14 1428 04/10/14 1537 04/11/14 0435 04/11/14 1750  AST 90* 87* 57* 66*  ALT 19 15 11 11   ALKPHOS 107 108 85 92  BILITOT 0.49 0.4 0.2* 0.3  PROT 7.9 7.9 6.2 6.6  ALBUMIN 3.2* 3.4* 2.5* 2.5*    Recent Labs Lab 04/10/14 1537  LIPASE 9*    Recent Labs Lab 04/10/14 1537  AMMONIA 44   Coagulation profile No results for input(s): INR, PROTIME in the last 168 hours.  CBC:  Recent Labs Lab 04/10/14 1427 04/10/14 1537  04/11/14 0435 04/11/14 1750  04/13/14 0545 04/14/14 0135 04/15/14 0505 04/16/14 0845 04/17/14 0455  WBC 16.7* 16.4*  < > 13.0* 13.5*  < > 19.1* 17.0* 20.8* 24.8* 19.9*  NEUTROABS 13.6* 13.3*  --  10.8* 11.5*  --   --   --   --   --   --   HGB 11.2* 10.7*  < > 8.4* 8.8*  < > 8.1* 8.1* 8.1* 7.8* 9.2*  HCT 33.7* 32.0*  < > 25.8* 27.1*  < >  24.4* 24.4* 24.8* 23.3* 27.7*  MCV 92.3 92.0  < > 94.2 92.5  < > 91.0 92.4 91.9 91.0 91.1  PLT 222 227  < > 180 211  < > 191 173 155 148* 125*  < > = values in this interval not displayed. Cardiac Enzymes:  Recent Labs Lab 04/10/14 1537 04/10/14 2300 04/13/14 1330 04/13/14 1910 04/14/14 0135  TROPONINI <0.30 <0.30 <0.30 <0.30 <0.30   BNP: Invalid input(s): POCBNP CBG: No results for input(s): GLUCAP in the last 168 hours. D-Dimer No results for input(s): DDIMER in the last 72 hours. Hgb A1c No results for input(s): HGBA1C in the last 72 hours. Lipid Profile No results for input(s): CHOL, HDL, LDLCALC, TRIG, CHOLHDL, LDLDIRECT in the last 72 hours. Thyroid function studies No results for input(s): TSH, T4TOTAL, T3FREE, THYROIDAB in the last 72 hours.  Invalid input(s): FREET3 Anemia work up No results for input(s): VITAMINB12, FOLATE, FERRITIN, TIBC, IRON, RETICCTPCT in the last 72 hours. Microbiology Recent Results (from the past 240 hour(s))  TECHNOLOGIST REVIEW     Status: None   Collection Time: 04/10/14  2:27 PM  Result Value Ref Range Status   Technologist Review 2% Nrbcs, 3% metamyelocytes   Final  Blood Culture (routine x 2)     Status: None   Collection Time: 04/10/14  3:20 PM  Result Value Ref Range Status   Specimen Description BLOOD LEFT CHEST  Final   Special Requests BOTTLES DRAWN AEROBIC AND ANAEROBIC 5 CC EA  Final   Culture  Setup Time   Final    04/10/2014 21:17 Performed at [facility]    Culture   Final    NO GROWTH 5 DAYS Performed at [facility]    Report Status 04/16/2014 FINAL  Final  Blood Culture (routine x 2)     Status: None (Preliminary result)   Collection Time: 04/10/14  3:25 PM  Result Value Ref Range Status   Specimen Description BLOOD LEFT ARM  Final   Special Requests BOTTLES DRAWN AEROBIC AND ANAEROBIC 8CC  Final   Culture  Setup Time   Final    04/11/2014 03:55 Performed at [facility]   Culture   Final           BLOOD CULTURE RECEIVED NO GROWTH TO DATE CULTURE WILL BE HELD FOR 5 DAYS BEFORE ISSUING A FINAL NEGATIVE REPORT Performed at [facility]   Report Status PENDING  Incomplete  Urine culture     Status: None   Collection Time: 04/10/14  3:37 PM  Result Value Ref Range Status   Specimen Description URINE, CATHETERIZED  Final   Special Requests NONE  Final   Culture  Setup Time   Final    04/10/2014 21:31 Performed at [place] Performed at [facility]  Final   Culture NO GROWTH Performed at [facility]  Final   Report Status 04/11/2014 FINAL  Final  MRSA PCR Screening     Status: None   Collection Time: 04/10/14 10:20 PM  Result Value Ref Range Status   MRSA by PCR NEGATIVE NEGATIVE Final    Comment:        The GeneXpert MRSA Assay (FDA approved for NASAL specimens only), is one component of a comprehensive MRSA colonization surveillance program. It is not intended to diagnose MRSA infection nor to guide or monitor treatment for MRSA infections.  Culture, blood (routine x 2)  Status: None (Preliminary result)   Collection  Time: 04/11/14  5:50 PM  Result Value Ref Range Status   Specimen Description BLOOD LEFT ARM  Final   Special Requests BOTTLES DRAWN AEROBIC AND ANAEROBIC  10 CC  Final   Culture  Setup Time   Final    04/11/2014 22:33 Performed at [facility]   Culture   Final           BLOOD CULTURE RECEIVED NO GROWTH TO DATE CULTURE WILL BE HELD FOR 5 DAYS BEFORE ISSUING A FINAL NEGATIVE REPORT Performed at [facility]   Report Status PENDING  Incomplete  Culture, blood (routine x 2)     Status: None (Preliminary result)   Collection Time: 04/11/14  5:59 PM  Result Value Ref Range Status   Specimen Description BLOOD LEFT ARM  Final   Special Requests BOTTLES DRAWN AEROBIC AND ANAEROBIC  10 CC  Final   Culture  Setup Time   Final    04/11/2014 22:33 Performed at [facility]   Culture   Final           BLOOD CULTURE RECEIVED NO GROWTH TO DATE CULTURE WILL BE HELD FOR 5 DAYS BEFORE ISSUING A FINAL NEGATIVE REPORT Performed at [facility]   Report Status PENDING  Incomplete      Studies:  Dg Chest 2 View (if Patient Has Fever And/or Copd)  04/03/2014   CLINICAL DATA:  58 year old female with current history of breast cancer diagnosed in July, metastatic disease to brain and bone. Ongoing chemotherapy. Generalized pain including mid chest pain. Shortness of breath and cough. Initial encounter.  EXAM: CHEST  2 VIEW  COMPARISON:  CT chest and CT Abdomen and Pelvis from 0841 hr today, and earlier.  FINDINGS: Stable left chest porta cath. Lower lung volumes with linear bibasilar opacity which most resembles atelectasis. Right suprahilar mass shows radiographic regression since 12/30/2013. No pneumothorax or pulmonary edema. Suggestion of small pleural effusions on these films, but there was non by CT earlier today. Stable cardiac size and mediastinal contours. Visualized tracheal air column is within normal limits.  Stable visualized osseous structures. Retained oral  contrast in the colon.  IMPRESSION: Lower lung volumes with bibasilar atelectasis, and possibly new small pleural effusions since the chest abdomen and pelvis CTs done at 0841 hr today. See also that CT report.   Electronically Signed   By: Lars Pinks M.D.   On: 04/03/2014 20:13   Ct Head Wo Contrast  04/10/2014   CLINICAL DATA:  Stage IV breast cancer.  Confusion  EXAM: CT HEAD WITHOUT CONTRAST  TECHNIQUE: Contiguous axial images were obtained from the base of the skull through the vertex without intravenous contrast.  COMPARISON:  MRI 03/31/2012  FINDINGS: Left frontal parietal cortical enhancing lesion on MRI is noted. There is adjacent vasogenic edema on the CT which is unchanged from the MRI. No associated hemorrhage.  No other areas of mass or edema identified. No acute ischemic infarct. Ventricle size normal. Negative for hemorrhage  Mucoperiosteal thickening in the left sphenoid sinus compatible with chronic sinusitis.  IMPRESSION: Mild edema in the left frontal parietal white matter consistent with known metastatic deposit. No hemorrhage or other acute abnormality identified.   Electronically Signed   By: Franchot Gallo M.D.   On: 04/10/2014 16:50   Ct Chest W Contrast  04/03/2014   CLINICAL DATA:  Subsequent treatment strategy for breast cancer. Right breast cancer diagnosed July, 2015 with metastatic  disease to brain and bone. Ongoing chemotherapy. Prior left breast cancer in 2009.  EXAM: CT CHEST, ABDOMEN, AND PELVIS WITH CONTRAST  TECHNIQUE: Multidetector CT imaging of the chest, abdomen and pelvis was performed following the standard protocol during bolus administration of intravenous contrast.  CONTRAST:  4mL OMNIPAQUE IOHEXOL 300 MG/ML  SOLN  COMPARISON:  Multiple exams, including 01/05/2014  FINDINGS: CT CHEST FINDINGS  5 mm hypodense right thyroid nodule.  Thoracic adenopathy observed with conglomerate right lower paratracheal nodes have a short axis diameter of 1.2 cm (formerly 1.3 cm on  the PET-CT of 01/05/2014) on image 18 of series 2. The right upper lobe pulmonary nodule measures 2.2 by 1.9 cm on image 19 of series 4 (formerly 2.3 by 1.7 cm). A right upper paratracheal lymph node measures 0.6 cm in short axis on image 13 of series 2.  A right axillary lymph node which previously had a short axis parenchymal thickness of 1.2 cm currently has a short axis parenchymal thickness of 0.6 cm on image 12 of series 2 (this excludes the fatty hilum).  The hypermetabolic right breast mass is less bulky in size and indistinctly marginated on today's exam. The degree of skin thickening and subcutaneous edema in the vicinity of the mass is significantly reduced.  Rim calcified of 1.4 cm lesion with fatty center noted at the 12 o'clock position in the left breast just above the areola.  There is a subcarinal node measuring 1 cm in short axis, image 25 series 2, previously not readily measurable. A right infrahilar node just below the bronchus intermedius measures 1.2 cm in short axis (formerly 1.0 cm) but was not formerly hypermetabolic  A lower paraesophageal lymph node measures 1.0 cm in short axis on image 37 of series 2 (formerly 0.9 cm and not previously discernibly hypermetabolic).  Centrilobular emphysema. The known scattered osseous metastatic lesions throughout the thoracic skeleton rib fairly inconspicuous on CT, and despite being widespread are fairly difficult to compare to prior exams given the lack of easily identified individual lesions. Mild atelectasis or scarring in both lower lobes along the hemidiaphragms.  CT ABDOMEN AND PELVIS FINDINGS  Hepatobiliary: Dilated CBD at 1.7 cm with blunt termination near the ampulla. Dorsal pancreatic duct is not dilated. Equivocal intrahepatic biliary dilatation. Gallbladder surgically absent. Diffuse hepatic steatosis.  Pancreas: Unremarkable  Spleen: Unremarkable  Adrenals/Urinary Tract: Unremarkable  Stomach/Bowel: Sigmoid diverticulosis without active  diverticulitis. Prominent stool throughout the colon favors constipation.  Vascular/Lymphatic: Aortoiliac atherosclerotic vascular disease. 0.7 cm left external iliac lymph node, not previously hypermetabolic, similar in size to prior.  Reproductive: Uterus absent.  Ovaries not well seen.  Other: No supplemental non-categorized findings.  Musculoskeletal: As in the chest, there is vague heterogeneity in the osseous structures indicative of osseous metastatic disease, but difficult to quantify. Bone scan would probably be a better way to followup.  IMPRESSION: 1. Primarily improved appearance, with significant reduction in size of the right breast mass and in the surrounding edema, and considerable reduction in the right hilar and subpectoral adenopathy. 2. Essentially stable appearance of the right upper lobe pulmonary nodule the and similar appearance of the right lower paratracheal adenopathy. This may be a lung primary. 3. Slight enlargement of a right infrahilar lymph node currently 1.2 cm in short axis, but not formerly hypermetabolic. Equivocal enlargement of a lower periesophageal lymph node in the thorax. 4. Aside from the indistinct diffuse osseous metastatic disease, no specific findings of malignancy in the abdomen/pelvis. 5. Centrilobular emphysema. 6. Stably  dilated common bile duct with blunt termination near the ampulla. Cannot completely exclude a stricture although a component of the biliary dilatation may be due to prior cholecystectomy. 7. Sigmoid diverticulosis. 8.  Prominent stool throughout the colon favors constipation.   Electronically Signed   By: Sherryl Barters M.D.   On: 04/03/2014 09:12   Ct Angio Chest Pe W/cm &/or Wo Cm  04/11/2014   CLINICAL DATA:  Chest pain.  Current history of breast cancer.  EXAM: CT ANGIOGRAPHY CHEST WITH CONTRAST  TECHNIQUE: Multidetector CT imaging of the chest was performed using the standard protocol during bolus administration of intravenous contrast.  Multiplanar CT image reconstructions and MIPs were obtained to evaluate the vascular anatomy.  CONTRAST:  156mL OMNIPAQUE IOHEXOL 350 MG/ML SOLN  COMPARISON:  CT scan of April 03, 2014.  FINDINGS: Minimal bilateral pleural effusions are noted with adjacent subsegmental atelectasis. No pneumothorax is noted. 19 x 16 mm right suprahilar mass is again noted consistent with malignancy or metastatic disease. There is no evidence of pulmonary embolus. There is no evidence of thoracic aortic dissection or aneurysm. Stable prevascular, sub carinal, right hilar and right peritracheal adenopathy consistent with metastatic disease. Left subclavian Port-A-Cath is noted with distal tip at the cavoatrial junction. Visualized portion of upper abdomen appears normal. Stable right breast mass.  Review of the MIP images confirms the above findings.  IMPRESSION: There is no evidence of pulmonary embolus.  Minimal bilateral pleural effusions are noted with adjacent subsegmental atelectasis.  Stable mediastinal adenopathy and right suprahilar mass is noted consistent with metastatic disease.   Electronically Signed   By: Sabino Dick M.D.   On: 04/11/2014 10:42   Mr Jeri Cos ZD Contrast  03/31/2014   CLINICAL DATA:  Right breast cancer. Metastatic right axillary lymph node. Metastatic disease the brain. SRS.  EXAM: MRI HEAD WITHOUT AND WITH CONTRAST  TECHNIQUE: Multiplanar, multiecho pulse sequences of the brain and surrounding structures were obtained without and with intravenous contrast.  CONTRAST:  95mL MULTIHANCE GADOBENATE DIMEGLUMINE 529 MG/ML IV SOLN  COMPARISON:  CT head without contrast 8/6 teen/ 15 and 01/06/2014.  FINDINGS: The previously noted left frontal lobe mass is decreased in size, now measuring 7 x 11 x 8 mm. Adjacent edema has decreased as well. Periventricular white matter changes bilaterally are otherwise stable. No additional enhancing lesions are present. No acute infarct or hemorrhage is evident.  Flow is  present in the major intracranial arteries. The globes and orbits are intact. Mild mucosal thickening is present in the anterior ethmoid air cells and frontal sinuses. The mastoid air cells are clear.  IMPRESSION: 1. Decreased size of left frontal lobe mass lesion, now measuring 7 x 11 x 8 mm. 2. No new lesions. 3. Stable periventricular white matter changes.   Electronically Signed   By: Lawrence Santiago M.D.   On: 03/31/2014 14:23   Ct Abdomen Pelvis W Contrast  04/10/2014   CLINICAL DATA:  58 year old female with generalized abdominal pain and weakness. Stage IV breast cancer.  EXAM: CT ABDOMEN AND PELVIS WITH CONTRAST  TECHNIQUE: Multidetector CT imaging of the abdomen and pelvis was performed using the standard protocol following bolus administration of intravenous contrast.  CONTRAST:  91mL OMNIPAQUE IOHEXOL 300 MG/ML SOLN, 126mL OMNIPAQUE IOHEXOL 300 MG/ML SOLN  COMPARISON:  CT of the chest, abdomen and pelvis 03/24/2014.  FINDINGS: Lower chest: Dependent atelectasis in lung bases bilaterally. Paraesophageal lymphadenopathy again noted measuring up to 1 cm in short axis.  Hepatobiliary: No focal  cystic or solid hepatic lesions. Trace intrahepatic biliary ductal dilatation (unchanged). Common bile duct remains dilated in the porta hepatis measuring 15 mm on today's examination (decreased compared to prior). Status post cholecystectomy.  Pancreas: Atrophic, but otherwise unremarkable.  Spleen: Unremarkable.  Adrenals/Urinary Tract: Bilateral adrenal glands and bilateral kidneys are normal in appearance. No hydronephrosis to suggest urinary tract obstruction at this time. Foley balloon catheter in the lumen of the urinary bladder, which is completely decompressed.  Stomach/Bowel: The stomach is normal in appearance. No pathologic dilatation of small bowel or colon. Numerous colonic diverticulae are noted, without surrounding inflammatory changes to suggest an acute diverticulitis at this time.   Vascular/Lymphatic: Extensive atherosclerosis throughout the abdominal and pelvic vasculature, without evidence of aneurysm or dissection. Retroaortic left renal vein. Numerous reactive size lymph nodes are noted throughout the retroperitoneum (nonspecific). No pathologically enlarged lymph nodes are noted in the abdomen or pelvis.  Reproductive: Status post hysterectomy. Ovaries are not confidently identified may be surgically absent or atrophic.  Other: No significant volume of ascites.  No pneumoperitoneum.  Musculoskeletal: Bones are diffusely heterogeneous in appearance with innumerable small lytic lesions, compatible with widespread metastatic disease throughout the skeleton.  IMPRESSION: 1. No acute findings in the abdomen or pelvis. 2. Persistent very mild intrahepatic biliary ductal dilatation, and decreasing extrahepatic biliary ductal dilatation. This is favored to represent "functional" dilatation in this post cholecystectomy its patient, however, correlation with liver function tests is recommended. The possibility of a distal ductal stricture or tiny ampullary lesion is difficult to entirely exclude, particularly if there are elevated liver function tests. 3. Colonic diverticulosis without findings to suggest acute diverticulitis at this time. 4. Interval placement of Foley catheter in the urinary bladder which appears properly located. 5. Additional incidental findings, as above, similar to the recent prior examination.   Electronically Signed   By: Vinnie Langton M.D.   On: 04/10/2014 20:04   Ct Abdomen Pelvis W Contrast  04/03/2014   CLINICAL DATA:  Subsequent treatment strategy for breast cancer. Right breast cancer diagnosed July, 2015 with metastatic disease to brain and bone. Ongoing chemotherapy. Prior left breast cancer in 2009.  EXAM: CT CHEST, ABDOMEN, AND PELVIS WITH CONTRAST  TECHNIQUE: Multidetector CT imaging of the chest, abdomen and pelvis was performed following the standard  protocol during bolus administration of intravenous contrast.  CONTRAST:  36mL OMNIPAQUE IOHEXOL 300 MG/ML  SOLN  COMPARISON:  Multiple exams, including 01/05/2014  FINDINGS: CT CHEST FINDINGS  5 mm hypodense right thyroid nodule.  Thoracic adenopathy observed with conglomerate right lower paratracheal nodes have a short axis diameter of 1.2 cm (formerly 1.3 cm on the PET-CT of 01/05/2014) on image 18 of series 2. The right upper lobe pulmonary nodule measures 2.2 by 1.9 cm on image 19 of series 4 (formerly 2.3 by 1.7 cm). A right upper paratracheal lymph node measures 0.6 cm in short axis on image 13 of series 2.  A right axillary lymph node which previously had a short axis parenchymal thickness of 1.2 cm currently has a short axis parenchymal thickness of 0.6 cm on image 12 of series 2 (this excludes the fatty hilum).  The hypermetabolic right breast mass is less bulky in size and indistinctly marginated on today's exam. The degree of skin thickening and subcutaneous edema in the vicinity of the mass is significantly reduced.  Rim calcified of 1.4 cm lesion with fatty center noted at the 12 o'clock position in the left breast just above the areola.  There  is a subcarinal node measuring 1 cm in short axis, image 25 series 2, previously not readily measurable. A right infrahilar node just below the bronchus intermedius measures 1.2 cm in short axis (formerly 1.0 cm) but was not formerly hypermetabolic  A lower paraesophageal lymph node measures 1.0 cm in short axis on image 37 of series 2 (formerly 0.9 cm and not previously discernibly hypermetabolic).  Centrilobular emphysema. The known scattered osseous metastatic lesions throughout the thoracic skeleton rib fairly inconspicuous on CT, and despite being widespread are fairly difficult to compare to prior exams given the lack of easily identified individual lesions. Mild atelectasis or scarring in both lower lobes along the hemidiaphragms.  CT ABDOMEN AND PELVIS  FINDINGS  Hepatobiliary: Dilated CBD at 1.7 cm with blunt termination near the ampulla. Dorsal pancreatic duct is not dilated. Equivocal intrahepatic biliary dilatation. Gallbladder surgically absent. Diffuse hepatic steatosis.  Pancreas: Unremarkable  Spleen: Unremarkable  Adrenals/Urinary Tract: Unremarkable  Stomach/Bowel: Sigmoid diverticulosis without active diverticulitis. Prominent stool throughout the colon favors constipation.  Vascular/Lymphatic: Aortoiliac atherosclerotic vascular disease. 0.7 cm left external iliac lymph node, not previously hypermetabolic, similar in size to prior.  Reproductive: Uterus absent.  Ovaries not well seen.  Other: No supplemental non-categorized findings.  Musculoskeletal: As in the chest, there is vague heterogeneity in the osseous structures indicative of osseous metastatic disease, but difficult to quantify. Bone scan would probably be a better way to followup.  IMPRESSION: 1. Primarily improved appearance, with significant reduction in size of the right breast mass and in the surrounding edema, and considerable reduction in the right hilar and subpectoral adenopathy. 2. Essentially stable appearance of the right upper lobe pulmonary nodule the and similar appearance of the right lower paratracheal adenopathy. This may be a lung primary. 3. Slight enlargement of a right infrahilar lymph node currently 1.2 cm in short axis, but not formerly hypermetabolic. Equivocal enlargement of a lower periesophageal lymph node in the thorax. 4. Aside from the indistinct diffuse osseous metastatic disease, no specific findings of malignancy in the abdomen/pelvis. 5. Centrilobular emphysema. 6. Stably dilated common bile duct with blunt termination near the ampulla. Cannot completely exclude a stricture although a component of the biliary dilatation may be due to prior cholecystectomy. 7. Sigmoid diverticulosis. 8.  Prominent stool throughout the colon favors constipation.    Electronically Signed   By: Sherryl Barters M.D.   On: 04/03/2014 09:12   Dg Chest Port 1 View  04/15/2014   CLINICAL DATA:  Hypoxemia.  EXAM: PORTABLE CHEST - 1 VIEW  COMPARISON:  04/14/2014 and 04/13/2014 and CT scan dated 04/11/2014  FINDINGS: Power port in place, unchanged. There has been rapid clearing of the bilateral infiltrates and improvement in the bilateral small effusions. The findings probably represent pulmonary edema.  Spiculated 3 cm mass is noted in the right upper lobe. Heart size and vascularity are normal.  IMPRESSION: Rapid improvement in the bilateral pulmonary edema. Diminished small bilateral pleural effusions.   Electronically Signed   By: Rozetta Nunnery M.D.   On: 04/15/2014 05:37   Dg Chest Port 1 View  04/14/2014   CLINICAL DATA:  Respiratory failure and right chest pain. Shortness of breath.  EXAM: PORTABLE CHEST - 1 VIEW  COMPARISON:  04/13/2014  FINDINGS: Port-A-Cath tip in the SVC region. Increased densities at both lung bases may represent worsening atelectasis or pleural fluid. Again noted are prominent lung markings. Heart size is stable. Negative for a pneumothorax. Probable scarring at the left lung apex.  IMPRESSION:  Increased basilar densities may represent worsening atelectasis or pleural effusions.  Prominent lung markings suggest mild edema.   Electronically Signed   By: Markus Daft M.D.   On: 04/14/2014 07:32   Dg Chest Port 1 View  04/13/2014   CLINICAL DATA:  Respiratory failure, hypoxia  EXAM: PORTABLE CHEST - 1 VIEW  COMPARISON:  04/04/2014  FINDINGS: Cardiomediastinal silhouette is stable. Slight improvement in aeration. Mild interstitial prominence bilaterally without convincing pulmonary edema. Streaky bilateral basilar atelectasis or infiltrate. Stable left subclavian Port-A-Cath position.  IMPRESSION: Slight improvement in aeration. Mild interstitial prominence bilaterally without convincing pulmonary edema. Streaky bilateral basilar atelectasis or  infiltrate.   Electronically Signed   By: Lahoma Crocker M.D.   On: 04/13/2014 11:39   Dg Chest Port 1 View  04/12/2014   CLINICAL DATA:  Effusions, breast cancer.  EXAM: PORTABLE CHEST - 1 VIEW  COMPARISON:  04/11/2014 chest CT  FINDINGS: Known right suprahilar nodule and mediastinal/right hilar lymphadenopathy, better characterized on recent CT. Bibasilar airspace opacities and small right greater than left pleural effusion, increased in the interval. Interstitial prominence. Left chest wall Port-A-Cath with tip projecting over the mid SVC. No interval osseous change.  IMPRESSION: Small right greater than left pleural effusions, increased in the interval.  Bibasilar opacities; atelectasis (favored) versus infiltrate.  Interstitial prominence in part reflects COPD. Superimposed interstitial edema not excluded.  Known right upper lobe mass and adenopathy, better characterized on recent CT.   Electronically Signed   By: Carlos Levering M.D.   On: 04/12/2014 06:35   Dg Chest Port 1 View  04/10/2014   CLINICAL DATA:  Generalized weakness, shortness of breath, fever, and confusion, history stage IV breast cancer  EXAM: PORTABLE CHEST - 1 VIEW  COMPARISON:  Portable exam 1533 hr compared to 04/03/2014  FINDINGS: RIGHT jugular Port-A-Cath with tip projecting over SVC near cavoatrial junction.  Borderline enlargement of cardiac silhouette.  Atherosclerotic calcification aorta.  Mediastinal contours and pulmonary vascularity normal.  Accentuated interstitial markings in part at least due to differences in technique.  Bibasilar atelectasis versus infiltrates, increased.  Upper lungs clear.  Questionable tiny LEFT pleural effusion.  No pneumothorax.  Bones demineralized.  IMPRESSION: Bibasilar atelectasis versus infiltrates.   Electronically Signed   By: Lavonia Dana M.D.   On: 04/10/2014 16:00   Dg Abd Portable 1v  04/13/2014   CLINICAL DATA:  severe abdominal pain and constipation  EXAM: PORTABLE ABDOMEN - 1 VIEW   COMPARISON:  04/10/2014  FINDINGS: No small bowel air-fluid levels are noted. Mild gaseous distended small bowel loops probable ileus. There is moderate stool in right colon. Residual contrast material in right colon and proximal left colon. Moderate gaseous distension of transverse colon and rectosigmoid colon. No free abdominal air.  IMPRESSION: No small bowel air-fluid levels. Mild gaseous distended small bowel loops probable ileus. Stool in right colon. Moderate colonic gaseous distension.   Electronically Signed   By: Lahoma Crocker M.D.   On: 04/13/2014 09:38    Assessment: 58 y.o. 58 y.o. [place] woman with stage IV breast cancer involving brain, lung, and bones, admitted  04/10/2014 with PNA/ sepsis syndrome  (1) status post right breast biopsy of 2 separate masses in the right axillary lymph node 12/21/2013 showing a clinical T3 N3, stage IIIC invasive ductal carcinoma, triple negative, with an MIB-1 of 85%; staging studies 01/04/2014 show metastatic disease to the brain, lung, and bones.  (2) status post stereotactic radiosurgery to a 1.7 cm left parietal lesion: 20  Gy given 01/13/2014  (3) systemic therapy consists of neoadjuvant eribulin, given days 1 and 8 of each 21 day cycle, started 01/17/2014  (4) zolendronate to be started once the patient's dental condition has been optimized. I am arranging for her to see a dentist.  (5) right mastectomy for local control to be considered once systemic disease comes under good control  (6) also status post left lumpectomy and sentinel lymph node sampling 11/23/2007 for a pTis pN0, stage 0 ductal carcinoma in situ, high-grade, estrogen and progesterone receptor negative, status post adjuvant radiation  Plan: she is much improved and I have encouraged her to get OOBTC and ambulate as tolerated. Anticipate d/c next 24-48 h  She has an appt w [place] 11/2 but we will cancel that treatment. Her next app[facility] at the [facility] is for 11/16-- patient is aware.  Will sign off at this point.  Greatly appreciate your help to this patient!   Chauncey Cruel, MD 04/17/2014  7:48 AM

## 2014-04-17 NOTE — Progress Notes (Signed)
PULMONARY / CRITICAL CARE MEDICINE   Name: [name] MRN: [id number] DOB: February 19, 1956    ADMISSION DATE:  04/10/2014 CONSULTATION DATE:  04/11/2014  REFERRING MD :  Dr. Sheran Fava  CHIEF COMPLAINT:  Fever  INITIAL PRESENTATION:  58 yo female smoker sent to ER from [place] with weakness, fever, chest pain, productive cough and confusion.  She has hx of metastatic breast cancer on chemo (eribulin), COPD.  Developed progressive fever, tachycardia, dyspnea and PCCM consulted to assist with management.  STUDIES:  10/26 CT head >> Lt frontal parietal metastatic lesion with mild edema 10/26 CT abd/pelvis >> ATX b/l, CBD 15 mm, atrophic pancreas, extensive atherosclerosis, innumerable metastatic bone lesions 10/27 CT chest >> small b/l effusions with ATX, 1.9 cm Rt suprahilar mass, no PE 10/30 [place] chest , small rt effusion and left sliver of fluid. Not enough to tap.   SIGNIFICANT EVENTS: 10/26 Admit 10/29 resp distress with activity  10/30 Resp distress continues with any activity 10/31 improved resp distress, sinus tach improved overnight with fluids  SUBJECTIVE:  Feels better this AM, no new complaints.   VITAL SIGNS: Temp:  [97.7 F (36.5 C)-98.7 F (37.1 C)] 98.2 F (36.8 C) (11/02 0419) Pulse Rate:  [80-102] 80 (11/02 0419) Resp:  [20-24] 20 (11/02 0419) BP: (123-132)/(60-80) 129/65 mmHg (11/02 0419) SpO2:  [92 %-98 %] 97 % (11/02 0419) HEMODYNAMICS:   VENTILATOR SETTINGS:   INTAKE / OUTPUT:  Intake/Output Summary (Last 24 hours) at 04/17/14 1009 Last data filed at 04/17/14 0600  Gross per 24 hour  Intake   2505 ml  Output    900 ml  Net   1605 ml    PHYSICAL EXAMINATION:  Gen: well appearing, no distress HEENT: NCAT, EOMi PULM: CTA B no wheezing CV: tachy, regular, no mgr AB: BS+, soft,  Ext: warm, no edema Neuro: A&OX4, maew  LABS:  CBC  Recent Labs Lab 04/15/14 0505 04/16/14 0845 04/17/14 0455  WBC 20.8* 24.8* 19.9*  HGB 8.1* 7.8*  9.2*  HCT 24.8* 23.3* 27.7*  PLT 155 148* 125*   Coag's No results for input(s): APTT, INR in the last 168 hours. BMET  Recent Labs Lab 04/15/14 0505 04/16/14 0845 04/17/14 0455  NA 138 137 138  K 3.9 3.7 4.2  CL 100 102 103  CO2 24 21 20   BUN 16 20 24*  CREATININE 0.49* 0.47* 0.54  GLUCOSE 142* 125* 179*   Electrolytes  Recent Labs Lab 04/12/14 0501  04/14/14 1640 04/15/14 0505 04/16/14 0845 04/17/14 0455  CALCIUM 10.1  < > 10.6* 10.4 9.4 9.3  MG 2.0  --  1.7  --   --   --   PHOS 6.2*  --   --   --   --   --   < > = values in this interval not displayed. Sepsis Markers  Recent Labs Lab 04/10/14 1553 04/10/14 1839 04/11/14 1750 04/11/14 1847 04/12/14 0501 04/13/14 0545  LATICACIDVEN 1.90 0.82 1.2  --   --   --   PROCALCITON  --   --   --  4.83 4.01 3.20   ABG  Recent Labs Lab 04/11/14 1750  PHART 7.342*  PCO2ART 31.5*  PO2ART 76.0*   Liver Enzymes  Recent Labs Lab 04/10/14 1537 04/11/14 0435 04/11/14 1750  AST 87* 57* 66*  ALT 15 11 11   ALKPHOS 108 85 92  BILITOT 0.4 0.2* 0.3  ALBUMIN 3.4* 2.5* 2.5*   Cardiac Enzymes  Recent Labs Lab 04/10/14 1537  04/13/14 1330 04/13/14 1910 04/14/14 0135  TROPONINI <0.30  < > <0.30 <0.30 <0.30  PROBNP 298.3*  --   --   --   --   < > = values in this interval not displayed. Glucose No results for input(s): GLUCAP in the last 168 hours.   Lab Results  Component Value Date   TSH 5.360* 04/10/2014     Imaging No results found.   ASSESSMENT / PLAN:  PULMONARY A: AE COPD Right lung mass P:   Brovana/Pulmicort Change xopenex/ipratropium to prn given tachycardia Change solumedrol to prednisone PO 20 mg PO daily starting 11/3 then taper over a week period (not on it at home) O2 as needed for O2 sat > 92%  CARDIOVASCULAR Lt port >> A:  Severe sepsis. 10/29 resolved Hypovolemic mediated sinus tachycardia P:  KVO IVF Tele  RENAL A:   Metabolic acidosis. Resolved P:   Monitor  BMET and UOP Replace electrolytes as needed  HEMATOLOGIC A:   Hx of metastatic breast cancer. Anemia of critical illness and chronic disease. P:  CBC intermittently Lovenox for DVT prevention  INFECTIOUS A:   Severe sepsis, treating as HCAP > improved P:   Day 8 of Abx, currently on cefepime.  D/ced flagyl D/ced vanc  Blood 10/26 >>NTD Urine 10/26 >>NTD Blood 10/27 >>NTD  ENDOCRINE A:   Mild elevation in TSH from 10/26. P:   Synthroid per TRH  NEUROLOGIC A:   Acute encephalopathy 2nd to sepsis. (resolved) P:   Monitor mental status  TODAY'S SUMMARY: Improved respiratory status, steroids changed to PO as above, recommend taper over a week period.  Titrate O2 down for sat of 88-92%, anticipate will get off of that today, if not then will need an ambulatory desat to arrange for home O2.  PCCM will sign off, please call back if needed.  Rush Farmer, M.D. [facility] Pulmonary/Critical Care Medicine. Pager: [phone]. After hours pager: [phone].

## 2014-04-18 ENCOUNTER — Inpatient Hospital Stay (HOSPITAL_COMMUNITY): Payer: Medicaid Other

## 2014-04-18 LAB — CBC
HEMATOCRIT: 29.4 % — AB (ref 36.0–46.0)
Hemoglobin: 9.5 g/dL — ABNORMAL LOW (ref 12.0–15.0)
MCH: 29.6 pg (ref 26.0–34.0)
MCHC: 32.3 g/dL (ref 30.0–36.0)
MCV: 91.6 fL (ref 78.0–100.0)
Platelets: 135 10*3/uL — ABNORMAL LOW (ref 150–400)
RBC: 3.21 MIL/uL — AB (ref 3.87–5.11)
RDW: 19.4 % — ABNORMAL HIGH (ref 11.5–15.5)
WBC: 22.7 10*3/uL — ABNORMAL HIGH (ref 4.0–10.5)

## 2014-04-18 MED ORDER — ENOXAPARIN SODIUM 80 MG/0.8ML ~~LOC~~ SOLN: 75.0000 mg | SUBCUTANEOUS | Status: DC

## 2014-04-18 MED ORDER — ALTEPLASE 2 MG IJ SOLR
2.0000 mg | Freq: Once | INTRAMUSCULAR | Status: DC
  Filled 2014-04-18: qty 2

## 2014-04-18 MED ORDER — HEPARIN SOD (PORK) LOCK FLUSH 100 UNIT/ML IV SOLN
500.0000 [IU] | INTRAVENOUS | Status: AC | PRN
  Administered 2014-04-18: 500 [IU]

## 2014-04-18 MED ORDER — ENOXAPARIN SODIUM 80 MG/0.8ML ~~LOC~~ SOLN: 80.0000 mg | SUBCUTANEOUS | Status: DC

## 2014-04-18 MED ORDER — PREDNISONE 20 MG PO TABS: ORAL_TABLET | ORAL | Status: DC

## 2014-04-18 MED ORDER — LIDOCAINE 5 % EX PTCH
1.0000 | MEDICATED_PATCH | CUTANEOUS | Status: DC
  Administered 2014-04-19: 1 via TRANSDERMAL
  Filled 2014-04-18 (×2): qty 1

## 2014-04-18 MED ORDER — POLYETHYLENE GLYCOL 3350 17 G PO PACK: 17.0000 g | PACK | Freq: Two times a day (BID) | ORAL | Status: AC

## 2014-04-18 MED ORDER — ENOXAPARIN SODIUM 80 MG/0.8ML ~~LOC~~ SOLN
75.0000 mg | SUBCUTANEOUS | Status: DC
  Administered 2014-04-18: 75 mg via SUBCUTANEOUS
  Filled 2014-04-18: qty 0.8

## 2014-04-18 MED ORDER — LEVALBUTEROL HCL 0.63 MG/3ML IN NEBU: 0.6300 mg | INHALATION_SOLUTION | RESPIRATORY_TRACT | Status: AC | PRN

## 2014-04-18 MED ORDER — BISACODYL 10 MG RE SUPP: 10.0000 mg | Freq: Every day | RECTAL | Status: AC | PRN

## 2014-04-18 MED ORDER — LEVOFLOXACIN 750 MG PO TABS: 750.0000 mg | ORAL_TABLET | Freq: Every day | ORAL | Status: DC

## 2014-04-18 MED ORDER — BUDESONIDE 0.5 MG/2ML IN SUSP: 0.5000 mg | Freq: Two times a day (BID) | RESPIRATORY_TRACT | Status: AC

## 2014-04-18 MED ORDER — ENOXAPARIN SODIUM 80 MG/0.8ML ~~LOC~~ SOLN
80.0000 mg | SUBCUTANEOUS | Status: DC
  Administered 2014-04-19: 80 mg via SUBCUTANEOUS
  Filled 2014-04-18: qty 0.8

## 2014-04-18 MED ORDER — ARFORMOTEROL TARTRATE 15 MCG/2ML IN NEBU: 15.0000 ug | INHALATION_SOLUTION | Freq: Two times a day (BID) | RESPIRATORY_TRACT | Status: AC

## 2014-04-18 MED ORDER — LEVOFLOXACIN 750 MG PO TABS
750.0000 mg | ORAL_TABLET | Freq: Every day | ORAL | Status: DC
  Administered 2014-04-18: 750 mg via ORAL
  Filled 2014-04-18 (×2): qty 1

## 2014-04-18 MED ORDER — OXYCODONE-ACETAMINOPHEN 5-325 MG PO TABS: 1.0000 | ORAL_TABLET | ORAL | Status: DC | PRN

## 2014-04-18 NOTE — Plan of Care (Signed)
Problem: Phase II Progression Outcomes Goal: Progress activity as tolerated unless otherwise ordered Outcome: Progressing     

## 2014-04-18 NOTE — Evaluation (Addendum)
Occupational Therapy Evaluation Patient Details Name: [name] MRN: [id number] DOB: 1955-08-01 Today's Date: 04/18/2014    History of Present Illness 58 yo female admitted with sepsis, weakness, confusion, chest pain. Hx of met breast cancer, COPD, ETOH abuse. Pt lives alone   Clinical Impression   Pt doing well up to bathroom with O2 on and sats in mid to upper 90s and then took off O2 to transfer over to cabinet to retrieve clothing. Pt picked up light weight duffle bag and experienced a sharp pain in R side rib cage area and she started to buckle, needing min assist to steady. O2 sats 89% on RA and up to 93-95% with PLB. Replaced O2. Nursing informed of sharp R side pain in rib cage area. Pt lives alone and per PT discussion with pt today, pt would like to d/c home and see how she does. Feel she will need more frequent assist initially and recommend [facility] aide and [place]. Will need to see how pt progresses with functional tasks while in hospital to see if pt can d/c home or will need SNF.    Follow Up Recommendations  Home health OT;Other (comment) [facility] aide and more frequent assist initially)    Equipment Recommendations  Tub/shower seat (if pt agreeable)    Recommendations for Other Services       Precautions / Restrictions Precautions Precautions: Fall Precaution Comments: O2. monitor sats      Mobility Bed Mobility   Bed Mobility: Sit to Supine       Sit to supine: Min assist   General bed mobility comments: due to right side rib cage pain  Transfers Overall transfer level: Needs assistance Equipment used: None Transfers: Sit to/from Stand Sit to Stand: Min assist         General transfer comment: min guard to stand from toilet early in session but min assist to stand from bed later in session due to sharp R side rib cage pain.    Balance                                            ADL Overall ADL's : Needs  assistance/impaired Eating/Feeding: Independent;Sitting   Grooming: Wash/dry hands;Set up;Sitting   Upper Body Bathing: Set up;Sitting   Lower Body Bathing: Minimal assistance;Sit to/from stand   Upper Body Dressing : Set up;Sitting   Lower Body Dressing: Minimal assistance;Sit to/from stand   Toilet Transfer: Minimal assistance;Ambulation;Comfort height toilet;Grab bars   Toileting- Clothing Manipulation and Hygiene: Min guard;Sit to/from stand         General ADL Comments: Pt up to bathroom with O2 on and sats in mid to upper 90s. Took off O2 to transfer over to closet and pt reached into closet to retrieve her duffle bag and experienced a sharp R sided pain in her rib area. O2 sats on RA 89% and up to 93-95% with rest and PLB. Replaced O2. Pt was some SOB with activity noted. When she had the episode of R side rib cage area pain, she buckled and required min assist to resteady herself. Assisted pt back over to bed to sit down. Pt stood after several minutes of rest and still having R side rib discomfort. Notified nursing of sharp pain. Assisted pt back to bed to eat meal tray that arrived. Pt was min guard to stand from the commode in the  bathroom but min assist for sit to stand activities later in session due to pain and unsteadiness.      Vision                     Perception     Praxis      Pertinent Vitals/Pain Pain Assessment: 0-10 Pain Score: 10-Worst pain ever Pain Location: R rib cage Pain Descriptors / Indicators: Sharp Pain Intervention(s): Repositioned;Relaxation;Other (comment) (informed nursing)     Hand Dominance     Extremity/Trunk Assessment Upper Extremity Assessment Upper Extremity Assessment: Generalized weakness           Communication Communication Communication: No difficulties   Cognition Arousal/Alertness: Awake/alert Behavior During Therapy: WFL for tasks assessed/performed Overall Cognitive Status: Within Functional Limits for  tasks assessed                     General Comments       Exercises       Shoulder Instructions      Home Living Family/patient expects to be discharged to:: Private residence Living Arrangements: Alone   Type of Home: Apartment Home Access: Stairs to enter Entrance Stairs-Number of Steps: 4 Entrance Stairs-Rails: None Home Layout: One level     Bathroom Shower/Tub: Teacher, early years/pre: Handicapped height     Home Equipment: Cane - single point          Prior Functioning/Environment Level of Independence: Independent        Comments: has cane but not using    OT Diagnosis: Generalized weakness   OT Problem List: Decreased strength;Decreased knowledge of use of DME or AE;Pain   OT Treatment/Interventions: Self-care/ADL training;Patient/family education;Therapeutic activities;DME and/or AE instruction    OT Goals(Current goals can be found in the care plan section) Acute Rehab OT Goals Patient Stated Goal: home OT Goal Formulation: With patient Time For Goal Achievement: 05/02/14 Potential to Achieve Goals: Good  OT Frequency: Min 2X/week   Barriers to D/C:            Co-evaluation              End of Session    Activity Tolerance: Patient limited by pain Patient left: in bed;with call bell/phone within reach   Time: 0852-0924 OT Time Calculation (min): 32 min Charges:  OT General Charges $OT Visit: 1 Procedure OT Evaluation $Initial OT Evaluation Tier I: 1 Procedure OT Treatments $Self Care/Home Management : 8-22 mins $Therapeutic Activity: 8-22 mins G-Codes:    Jules Schick  [phone] 04/18/2014, 10:11 AM

## 2014-04-18 NOTE — Progress Notes (Signed)
Physical Therapy Treatment Patient Details Name: [name] MRN: [id number] DOB: [birth year] Today's Date: 04/18/2014    History of Present Illness 58 yo female admitted with sepsis, weakness, confusion, chest pain. Hx of met breast cancer, COPD, ETOH abuse. Pt lives alone    PT Comments    Pt not progressing/performing well this session. Still experiencing severe pain with attempts to mobilize (occuring since this am). Attempted mobility however pt was unable to tolerate activity due to pain. Discussed d/c plan-pt still wants to go home. However I expressed my concern to her about being able to safely mobilize. Pt states she is going to get something else for pain ~12:00. Encouraged pt to get up and mobilize with nursing assistance after pain meds to see how she does. Mobility could potentially improve once pain controlled. However, based on this session, I do not feel pt is safe to d/c home at this time  Follow Up Recommendations  Home health PT;Supervision/Assistance - 24 hour ([place]. )     Equipment Recommendations  Rolling walker with 5" wheels (if possible)    Recommendations for Other Services OT consult     Precautions / Restrictions Precautions Precautions: Fall Precaution Comments: O2. monitor sats Restrictions Weight Bearing Restrictions: No    Mobility  Bed Mobility Overal bed mobility: Needs Assistance Bed Mobility: Supine to Sit;Sit to Supine     Supine to sit: HOB elevated;Min guard Sit to supine: HOB elevated;Min assist   General bed mobility comments: Increased time. Limited by pain. Assist needed to get legs back onto bed.  Transfers Overall transfer level: Needs assistance Equipment used: Straight cane Transfers: Sit to/from Stand Sit to Stand: Min guard;Min assist         General transfer comment: close guard for sit to stand, Min assist for stand to sit. Limited by pain. Noted knee buckling at times due to severity of  pain  Ambulation/Gait Ambulation/Gait assistance: Min assist Ambulation Distance (Feet): 5 Feet (forwards then backwards) Assistive device: Straight cane       General Gait Details: pt only able to tolerate a few steps in room. Limited by pain.    Stairs            Wheelchair Mobility    Modified Rankin (Stroke Patients Only)       Balance                                    Cognition Arousal/Alertness: Awake/alert Behavior During Therapy: WFL for tasks assessed/performed Overall Cognitive Status: Within Functional Limits for tasks assessed                      Exercises      General Comments        Pertinent Vitals/Pain Pain Assessment: 0-10 Pain Score: 10-Worst pain ever Pain Location: mid/R side chest/abdominal pain Pain Descriptors / Indicators: Sharp Pain Intervention(s): Repositioned;Relaxation    Home Living Family/patient expects to be discharged to:: Private residence Living Arrangements: Alone   Type of Home: Apartment Home Access: Stairs to enter Entrance Stairs-Rails: None Home Layout: One level Home Equipment: Cane - single point      Prior Function Level of Independence: Independent      Comments: has cane but not using   PT Goals (current goals can now be found in the care plan section) Acute Rehab PT Goals Patient Stated Goal: home Progress towards  PT goals: Not progressing toward goals - comment (Pain limiting mobility significantly)    Frequency  Min 3X/week    PT Plan Current plan remains appropriate    Co-evaluation             End of Session Equipment Utilized During Treatment: Gait belt;Oxygen Activity Tolerance: Patient limited by pain Patient left: in bed;with call bell/phone within reach     Time: 1219-7588 PT Time Calculation (min): 20 min  Charges:  $Therapeutic Activity: 8-22 mins                    G Codes:      Weston Anna, MPT Pager: [phone]

## 2014-04-18 NOTE — Progress Notes (Signed)
[facility] is providing the following services: Nebulizer, Tubseat w/back, Home 02  If patient discharges after hours, please call [phone].   [name] 04/18/2014, 2:18 PM

## 2014-04-18 NOTE — Plan of Care (Signed)
Problem: Phase I Progression Outcomes Goal: OOB as tolerated unless otherwise ordered Outcome: Completed/Met Date Met:  04/18/14     

## 2014-04-18 NOTE — Plan of Care (Signed)
Problem: Phase II Progression Outcomes Goal: Obtain order to discontinue catheter if appropriate Outcome: Not Applicable Date Met:  86/76/72

## 2014-04-18 NOTE — Progress Notes (Signed)
Pt attempted to get out of bed to use the bathroom and has significant right rib cage pain. MD updated via phone and DC held for today till better pain control management obtained. Pt and Family updated

## 2014-04-18 NOTE — Progress Notes (Signed)
Pt remains with significant right rib area pain with movement trying to get out of bed. Discussed with MD and will obtain x ray and medication for spasm.

## 2014-04-18 NOTE — Plan of Care (Signed)
Problem: Phase II Progression Outcomes Goal: IV changed to normal saline lock Outcome: Not Applicable Date Met:  04/18/14     

## 2014-04-18 NOTE — Progress Notes (Signed)
PT Cancellation Note  Patient Details Name: [name] MRN: [id number] DOB: Apr 23, 1956   Cancelled Treatment:    Reason Eval/Treat Not Completed: Pain limiting ability to participate. Pt requested to rest at this time-agreeable to PT checking back a little later. Experienced some sharp R sided pains while working with OT earlier. Discussed d/c plan-pt states she would like to see how she does at home first (vs going somewhere for rehab). Will try to check back later for PT session.   Weston Anna, MPT Pager: [phone]

## 2014-04-18 NOTE — Progress Notes (Addendum)
[facility] PROGRESS NOTE  [name] JTT:[id number] DOB: 12-09-1955 DOA: 04/10/2014 PCP: Lorayne Marek, MD  Brief Summary  [name] is a 58 y.o. female history of metastatic breast cancer, COPD and hypertension was referred to the ER from the [place] after patient was found to be weak and confused. Patient had gone to her regular chemotherapy and was found to be confused and weak.  In the ER, she was febrile and complaining of right lower chest pain, productive cough, and possible abdominal pain.  Chest x-ray demonstrated bilateral atelectasis versus infiltrates and CT abdomen and pelvis was unremarkable. Patient has been started on empiric antibiotics for possible sepsis and admitted to stepdown.  Assessment/Plan Sepsis,  probably from pneumonia .   -  Received  vancomycin for 7 and cefepime  7. Received flagyl for 4 days. Flagyl discontinue 10-30.  -  CT angio negative for PE on admission.   -  blood cultures no growth to date.  -  Legionella and strep pneumo ag negative.  -  Flu PCR negative -  Sputum culture if able -  UA neg - CT abd/pelvis neg 10-26.  -WBC increased 10-31  but she was started on Solumedrol 10-30.  -continue with same regimen of antibiotics.  -WBC trending down, patient on steroids. Afebrile.  -Start oral Levaquin. Needs 10 day treatment.   Abdominal Pain: Right side, abdomen. She had CT abdomen on admission which was negative other than bile duct dilation. LFT not significant elevated. Unable to ambulate due to pain. Will try controlled pain prior to discharge.   Acute Hypoxic respiratory failure; multifactorial: PNA, COPD, metastasis diseases.  -BIPAP PRN.  -Received IV lasix overnight 10-28.  -NSL fluids.  -CT angio negative for PE. Stable mediastinal adenopathy and right suprahilar mass is noted consistent with metastatic disease. -increase oxygen requirement on 10-29.  received 60 mg lasix 10-30. Tachycardia got worse.  -Chest x ray  10-31; Rapid improvement in the bilateral pulmonary edema. Diminished small  bilateral pleural effusions. -stable. Improved. Evaluation for home oxygen.  Prednisone taper.   Left DVT Gastrocnemius Vein: Continue with Lovenox. Pharmacy to dose. Patient agree to continue with lovenox.   Sinus Tachycardia; stable. Asymptomatic. Responded to fluids.   Chest pain; on and off, since admission. Troponin negative. Continue with  protonix. CT angio on admission negative,   Constipation, Ileus.; resolved. Has had multiple BM.  Tolerating regular diet.  KUB 10-30. Mild gaseous distended small bowel loops probable ileus. Stool in right colon. Moderate colonic gaseous distension. Continue with miralax, docusate. Dulcolax suppository PRN.   Acute encephalopathy due to sepsis and high fever - will resume flexeril PRN, careful with oversedation.  -  Treat infection  Generalized weakness from infection and dehydration.  IVF and abx -  PT/OT consults  Metastatic breast cancer -  Dr. Jana Hakim on rounding list  Hypercalcemia likely secondary to dehydration and already improved with IVF  HTN with borderline low BPs -  Hold BP medications  COPD;  -  Continue flovent -  duonebs with albuterol prn  Leukocytosis due to sepsis, continue with  Abx.  Anemia of chronic disease, hemoglobin trending down with IVF -  Transfuse for hgb < 7 or symptomatic anemia - Hb stable at 9. Received one unit PRBC.   Metabolic acidosis with mild gap likely due to ketosis and dehydration.  Lactic acid was normal.  Improving with IVF Resolved.    Sick euthyroid -  Repeat TSH in 4-6 weeks  Diet:  regular  Access:  port IVF:  yes Proph:  lovenox  Code Status: full Family Communication: patient alone and family member at bedside.  Disposition Plan: PT evaluation. Home vs SNF tomorrow.    Consultants:  Oncology added to rounding list  Procedures:  10/26 CT head unremarkable  10/26 CT abd/pelvis  neg  10/26 CXR bibasilar PNA vs. aletelectasis  Antibiotics:  Vancomycin 10/26 >>  Aztreonam 10/26 > 10/27  Levofloxacin 10/26   Cefepime 10/27 >>  HPI/Subjective: She is breathing well, No nausea, has had BM.  Complaining of right side abdominal pain, ribs pain. Worse with walking.   Objective: Filed Vitals:   04/18/14 0406 04/18/14 1003 04/18/14 1016 04/18/14 1258  BP: 162/69   131/78  Pulse: 101   84  Temp: 97.5 F (36.4 C)   98.3 F (36.8 C)  TempSrc: Oral   Oral  Resp: 20   18  Height:      Weight:      SpO2: 95% 89% 95% 99%    Intake/Output Summary (Last 24 hours) at 04/18/14 1720 Last data filed at 04/18/14 0900  Gross per 24 hour  Intake    650 ml  Output      0 ml  Net    650 ml   Filed Weights   04/12/14 0400 04/14/14 0500 04/15/14 0600  Weight: 52.4 kg (115 lb 8.3 oz) 53.9 kg (118 lb 13.3 oz) 52.2 kg (115 lb 1.3 oz)    Exam:   General:  Thin BF, in no distress. .   Cardiovascular:  Tachycardic RR, nl S1, S2.   Respiratory:  CTAB, tachypneic.   Abdomen:   NABS, soft, mildly distended and mild tenderness.   MSK:  No edema.   Neuro:  Alert and oriented.   Data Reviewed: Basic Metabolic Panel:  Recent Labs Lab 04/12/14 0501  04/14/14 0135 04/14/14 1640 04/15/14 0505 04/16/14 0845 04/17/14 0455  NA 140  < > 137 137 138 137 138  K 3.6*  < > 4.4 4.0 3.9 3.7 4.2  CL 104  < > 103 99 100 102 103  CO2 19  < > 20 22 24 21 20   GLUCOSE 176*  < > 106* 104* 142* 125* 179*  BUN 13  < > 16 13 16 20  24*  CREATININE 0.75  < > 0.69 0.66 0.49* 0.47* 0.54  CALCIUM 10.1  < > 9.6 10.6* 10.4 9.4 9.3  MG 2.0  --   --  1.7  --   --   --   PHOS 6.2*  --   --   --   --   --   --   < > = values in this interval not displayed. Liver Function Tests:  Recent Labs Lab 04/11/14 1750  AST 66*  ALT 11  ALKPHOS 92  BILITOT 0.3  PROT 6.6  ALBUMIN 2.5*   No results for input(s): LIPASE, AMYLASE in the last 168 hours. No results for input(s): AMMONIA in  the last 168 hours. CBC:  Recent Labs Lab 04/11/14 1750  04/14/14 0135 04/15/14 0505 04/16/14 0845 04/17/14 0455 04/18/14 0534  WBC 13.5*  < > 17.0* 20.8* 24.8* 19.9* 22.7*  NEUTROABS 11.5*  --   --   --   --   --   --   HGB 8.8*  < > 8.1* 8.1* 7.8* 9.2* 9.5*  HCT 27.1*  < > 24.4* 24.8* 23.3* 27.7* 29.4*  MCV 92.5  < > 92.4 91.9 91.0 91.1 91.6  PLT 211  < > 173 155 148* 125* 135*  < > = values in this interval not displayed. Cardiac Enzymes:  Recent Labs Lab 04/13/14 1330 04/13/14 1910 04/14/14 0135  TROPONINI <0.30 <0.30 <0.30   BNP (last 3 results)  Recent Labs  04/10/14 1537  PROBNP 298.3*   CBG: No results for input(s): GLUCAP in the last 168 hours.  Recent Results (from the past 240 hour(s))  TECHNOLOGIST REVIEW     Status: None   Collection Time: 04/10/14  2:27 PM  Result Value Ref Range Status   Technologist Review 2% Nrbcs, 3% metamyelocytes  Final  Blood Culture (routine x 2)     Status: None   Collection Time: 04/10/14  3:20 PM  Result Value Ref Range Status   Specimen Description BLOOD LEFT CHEST  Final   Special Requests BOTTLES DRAWN AEROBIC AND ANAEROBIC 5 CC EA  Final   Culture  Setup Time   Final    04/10/2014 21:17 Performed at [facility]    Culture   Final    NO GROWTH 5 DAYS Performed at [facility]    Report Status 04/16/2014 FINAL  Final  Blood Culture (routine x 2)     Status: None   Collection Time: 04/10/14  3:25 PM  Result Value Ref Range Status   Specimen Description BLOOD LEFT ARM  Final   Special Requests BOTTLES DRAWN AEROBIC AND ANAEROBIC 8CC  Final   Culture  Setup Time   Final    04/11/2014 03:55 Performed at [place]   Final    NO GROWTH 5 DAYS Performed at [facility]    Report Status 04/17/2014 FINAL  Final  Urine culture     Status: None   Collection Time: 04/10/14  3:37 PM  Result Value Ref Range Status   Specimen Description URINE, CATHETERIZED  Final    Special Requests NONE  Final   Culture  Setup Time   Final    04/10/2014 21:31 Performed at [place] Performed at [facility]  Final   Culture NO GROWTH Performed at [facility]  Final   Report Status 04/11/2014 FINAL  Final  MRSA PCR Screening     Status: None   Collection Time: 04/10/14 10:20 PM  Result Value Ref Range Status   MRSA by PCR NEGATIVE NEGATIVE Final    Comment:        The GeneXpert MRSA Assay (FDA approved for NASAL specimens only), is one component of a comprehensive MRSA colonization surveillance program. It is not intended to diagnose MRSA infection nor to guide or monitor treatment for MRSA infections.  Culture, blood (routine x 2)     Status: None   Collection Time: 04/11/14  5:50 PM  Result Value Ref Range Status   Specimen Description BLOOD LEFT ARM  Final   Special Requests BOTTLES DRAWN AEROBIC AND ANAEROBIC  10 CC  Final   Culture  Setup Time   Final    04/11/2014 22:33 Performed at [place]   Final    NO GROWTH 5 DAYS Performed at [facility]    Report Status 04/17/2014 FINAL  Final  Culture, blood (routine x 2)     Status: None   Collection Time: 04/11/14  5:59 PM  Result Value Ref Range Status   Specimen Description BLOOD LEFT ARM  Final   Special  Requests BOTTLES DRAWN AEROBIC AND ANAEROBIC  10 CC  Final   Culture  Setup Time   Final    04/11/2014 22:33 Performed at [facility]    Culture   Final    NO GROWTH 5 DAYS Note: Culture results may be compromised due to an excessive volume of blood received in culture bottles. Performed at [facility]    Report Status 04/17/2014 FINAL  Final     Studies: Dg Abd 1 View  04/18/2014   CLINICAL DATA:  Right lower abdominal pain.  EXAM: ABDOMEN - 1 VIEW  COMPARISON:  04/13/2014  FINDINGS: Mild gaseous scratch sec gas throughout mildly prominent large and small bowel. Findings similar to  prior study. No free air. No organomegaly. Visualized lung bases are clear.  IMPRESSION: Stable mild diffuse gaseous distention of bowel, suspect mild ileus.   Electronically Signed   By: Rolm Baptise M.D.   On: 04/18/2014 15:26    Scheduled Meds: . sodium chloride   Intravenous Once  . alteplase  2 mg Intracatheter Once  . antiseptic oral rinse  7 mL Mouth Rinse q12n4p  . arformoterol  15 mcg Nebulization BID  . budesonide (PULMICORT) nebulizer solution  0.5 mg Nebulization Q12H  . chlorhexidine  15 mL Mouth Rinse BID  . [START ON 04/19/2014] enoxaparin (LOVENOX) injection  80 mg Subcutaneous Q24H  . levofloxacin  750 mg Oral Daily  . pantoprazole  40 mg Oral Daily  . polyethylene glycol  17 g Oral BID  . predniSONE  40 mg Oral Q breakfast  . sodium chloride  10-40 mL Intracatheter Q12H   Continuous Infusions:    Principal Problem:   Sepsis Active Problems:   COPD (chronic obstructive pulmonary disease)   Breast cancer metastasized to bone   Hypertension   Septic shock   Acute on chronic respiratory failure   Acute on chronic respiratory failure with hypoxia   Constipation   Leukocytosis    Time spent: 30 min    [place], [facility] Pager [phone]. If 7PM-7AM, please contact night-coverage at [email or web address], password [facility] 04/18/2014, 5:20 PM  LOS: 8 days

## 2014-04-18 NOTE — Plan of Care (Signed)
Problem: Phase I Progression Outcomes Goal: Voiding-avoid urinary catheter unless indicated Outcome: Completed/Met Date Met:  04/18/14

## 2014-04-18 NOTE — Progress Notes (Signed)
Pt with Student Nurse tried ambulating to obtain Room AIr O2 sat to evaluate for Home Oxygen. Pt with "Severe sharp pain on the right side underneath the breast area". "it was so bad I couldn't walk" MD given update. Pt back in bed and states now the pain has eased. VSS

## 2014-04-18 NOTE — Progress Notes (Signed)
NUTRITION FOLLOW UP  Intervention:   -Encouraged continued excellent appetite -Promoted intake of fluids, fiber, and physical activity for assistance with constipation. Continue with bowel regimen -RD to continue to monitor  Nutrition Dx:   Increased nutrient needs (protien/kcal) related to chronic disease as evidenced by breast cancer w/mets/COPD; continues  Goal:   Pt to meet >/= 90% of their estimated nutrition needs; meeting  Monitor:   Total protein/energy intake, labs, weights, supplement needs  Assessment:   10/27: Family reported pt with one-two days of decreased appetite d/t weakness and confusion. Prior to this period, pt consumed 2-3 meals daily. Denied use of nutritional supplements or chemo related nutrition side effects  -Reported weight has remained stable. Previous medical records indicate a possible 6 lb weight loss over past month (4.8% body weight loss, non-significant for time frame); however pt was admitted with dehydration, which is likely impacting current weight -Pt consuming approximately 50% of breakfast. Assisted family is ordering pt's meals, noted that she would likely tolerate softer foods as pt's dentures are at home -D/t pt's hx of metastatic breast cancer and COPD, would likely benefit from nutrition supplement if PO intake does not improve to baseline. Will order PRN to utilize as warranted  11/03: -Pt reported excellent appetite, is consuming 90-100% of meals -Had difficulty with constipation previously during admit; however this has since improved with Miralax and docusate bowel regimen. Encouraged pt continued intake of fiber, fluids, and physical activity to assist in regular bowel movements -Has not been consuming supplements during admit as she has had excellent appetite. Denied feelings of nausea or abd pain post meals  Height: Ht Readings from Last 1 Encounters:  04/10/14 4\' 11"  (1.499 m)    Weight Status:   Wt Readings from Last 1  Encounters:  04/15/14 115 lb 1.3 oz (52.2 kg)    Re-estimated needs:  Kcal: 1600-1800 Protein: 70-80 gram Fluid: >/= 1600 ml daily  Skin: WDL  Diet Order: Diet regular Diet - low sodium heart healthy   Intake/Output Summary (Last 24 hours) at 04/18/14 1243 Last data filed at 04/18/14 0900  Gross per 24 hour  Intake    770 ml  Output      0 ml  Net    770 ml    Last BM: 11/02   Labs:   Recent Labs Lab 04/12/14 0501  04/14/14 1640 04/15/14 0505 04/16/14 0845 04/17/14 0455  NA 140  < > 137 138 137 138  K 3.6*  < > 4.0 3.9 3.7 4.2  CL 104  < > 99 100 102 103  CO2 19  < > 22 24 21 20   BUN 13  < > 13 16 20  24*  CREATININE 0.75  < > 0.66 0.49* 0.47* 0.54  CALCIUM 10.1  < > 10.6* 10.4 9.4 9.3  MG 2.0  --  1.7  --   --   --   PHOS 6.2*  --   --   --   --   --   GLUCOSE 176*  < > 104* 142* 125* 179*  < > = values in this interval not displayed.  CBG (last 3)  No results for input(s): GLUCAP in the last 72 hours.  Scheduled Meds: . sodium chloride   Intravenous Once  . alteplase  2 mg Intracatheter Once  . antiseptic oral rinse  7 mL Mouth Rinse q12n4p  . arformoterol  15 mcg Nebulization BID  . budesonide (PULMICORT) nebulizer solution  0.5 mg Nebulization Q12H  .  chlorhexidine  15 mL Mouth Rinse BID  . [START ON 04/19/2014] enoxaparin (LOVENOX) injection  80 mg Subcutaneous Q24H  . levofloxacin  750 mg Oral Daily  . pantoprazole  40 mg Oral Daily  . polyethylene glycol  17 g Oral BID  . predniSONE  40 mg Oral Q breakfast  . sodium chloride  10-40 mL Intracatheter Q12H    Continuous Infusions:   Atlee Abide MS RD LDN Clinical Dietitian [phone]

## 2014-04-19 LAB — CBC
HCT: 28.9 % — ABNORMAL LOW (ref 36.0–46.0)
HEMOGLOBIN: 9.3 g/dL — AB (ref 12.0–15.0)
MCH: 29.9 pg (ref 26.0–34.0)
MCHC: 32.2 g/dL (ref 30.0–36.0)
MCV: 92.9 fL (ref 78.0–100.0)
Platelets: 114 10*3/uL — ABNORMAL LOW (ref 150–400)
RBC: 3.11 MIL/uL — AB (ref 3.87–5.11)
RDW: 19.3 % — ABNORMAL HIGH (ref 11.5–15.5)
WBC: 19 10*3/uL — ABNORMAL HIGH (ref 4.0–10.5)

## 2014-04-19 MED ORDER — LIDOCAINE 5 % EX PTCH: 1.0000 | MEDICATED_PATCH | CUTANEOUS | Status: DC

## 2014-04-19 NOTE — Progress Notes (Signed)
RT charted against Pulmicort and Brovona for 04/19/14 at 1440.

## 2014-04-19 NOTE — Progress Notes (Signed)
PT Cancellation Note  Patient Details Name: [name] MRN: [id number] DOB: 02/26/1956   Cancelled Treatment:    Reason Eval/Treat Not Completed: Other (comment) (Consultation going and unable to see)   Ramond Dial 04/19/2014, 12:32 PM   Mee Hives, PT MS Acute Rehab Dept. Number: [phone]

## 2014-04-19 NOTE — Progress Notes (Addendum)
Occupational Therapy Treatment Patient Details Name: [name] MRN: [id number] DOB: 10/02/1955 Today's Date: 04/19/2014    History of present illness 58 yo female admitted with sepsis, weakness, confusion, chest pain. Hx of met breast cancer, COPD, ETOH abuse. Pt lives alone   OT comments  Pt able to mobilize into bathroom and transfer on and off toilet using walker and min guard assist today. Required increased time and effort with reports of 8/10 still in R rib/abdomen area. MD in room during part of session and aware of pain. Discussed recommendation for 24/7 care need with MD and pt as well as spoke to the pt's daughter on the phone. Discussed SNF but pt is declining option of SNF currently. Pt's daughter is aware of recommendation for 24/7 care and is working on arranging this for the pt. Will follow.     Follow Up Recommendations  Home health OT;Other (comment) [facility] aide)    Equipment Recommendations  3 in 1 bedside comode;Tub/shower seat;Other (comment) (per pt the tubseat has been delivered. will need rolling walker and 3in1. Pt reports she is 4 foot 11 inches.)    Recommendations for Other Services      Precautions / Restrictions Precautions Precautions: Fall Precaution Comments: O2. monitor sats       Mobility Bed Mobility Overal bed mobility: Needs Assistance Bed Mobility: Supine to Sit;Sit to Supine     Supine to sit: HOB elevated;Min guard Sit to supine: HOB elevated;Min guard   General bed mobility comments: increased time. able to bring LEs onto bed herself today.  Transfers Overall transfer level: Needs assistance Equipment used: Rolling walker (2 wheeled) Transfers: Sit to/from Stand Sit to Stand: Min guard         General transfer comment: close guard for safety. verbal cues for hand placement. increased time.    Balance                                   ADL       Grooming: Wash/dry hands;Min Radio producer: Min guard;Ambulation;Comfort height toilet;Grab bars;RW             General ADL Comments: Pt states pain in R side area is better than yesterday but still rates pain as an 8 with activity. Pt mobilized into bathroom and transferred on and off the toilet with min guard assist and increased time. She states she isnt short of breath but do note that she grunts with mobility at times. Her sats on 2L were 93% with activity. Discussed SNF option but pt states she would like to go home. Discussed that she will need 24/7 supervision/assist for safety and OT spoke with daughter on the phone during session and she is working on arranging this and verbalizes understanding of recommendation for this. Pt did use a walker to transfer into bathroom and pt states she does feel more stable with walker. Encouraged PLB with activity. Pt has a tubseat already delivered but will need walker and 3in1. Discussed benefit of 3in1 to help with transition on and off toilet.       Vision                     Perception     Praxis      Cognition   Behavior During Therapy:  WFL for tasks assessed/performed Overall Cognitive Status: Within Functional Limits for tasks assessed                       Extremity/Trunk Assessment               Exercises     Shoulder Instructions       General Comments      Pertinent Vitals/ Pain       Pain Assessment: 0-10 Pain Score: 8  Pain Location: R side chest/abdomen Pain Descriptors / Indicators: Sore Pain Intervention(s): Repositioned;Other (comment) (MD in room and aware)  Home Living                                          Prior Functioning/Environment              Frequency Min 2X/week     Progress Toward Goals  OT Goals(current goals can now be found in the care plan section)  Progress towards OT goals: Progressing toward goals     Plan Discharge plan remains appropriate     Co-evaluation                 End of Session Equipment Utilized During Treatment: Gait belt;Rolling walker   Activity Tolerance Patient limited by pain   Patient Left in bed;with bed alarm set;with call bell/phone within reach   Nurse Communication          Time: 1020-1050 OT Time Calculation (min): 30 min  Charges: OT General Charges $OT Visit: 1 Procedure OT Treatments $Self Care/Home Management : 8-22 mins $Therapeutic Activity: 8-22 mins  Jules Schick  [phone] 04/19/2014, 11:17 AM

## 2014-04-19 NOTE — Progress Notes (Signed)
CARE MANAGEMENT NOTE 04/19/2014  Patient:  [name], [name]   Account Number:  [id number]  Date Initiated:  04/11/2014  Documentation initiated by:  DAVIS,RHONDA  Subjective/Objective Assessment:   sepsis     Action/Plan:   home when stable   Anticipated DC Date:  04/17/2014   Anticipated DC Plan:  HOME/SELF CARE  In-house referral  NA      DC Planning Services  CM consult      PAC Choice  NA   Choice offered to / List presented to:  C-4 Adult Children   DME arranged  3-N-1  [place]  NEBULIZER/MEDS      DME agency  NA     [place] arranged  HH-1 RN      [place].   Status of service:  In process, will continue to follow Medicare Important Message given?   (If response is "NO", the following Medicare IM given date fields will be blank) Date Medicare IM given:   Medicare IM given by:   Date Additional Medicare IM given:   Additional Medicare IM given by:    Discharge Disposition:  [place]  Per UR Regulation:  Reviewed for med. necessity/level of care/duration of stay  If discussed at [place] of Stay Meetings, dates discussed:    Comments:  04/19/14 MMcGibboney, RN, BSN Pt referred me to her Daughter [name] for [place],  at [place] [phone]. [name] selected [facility], referral given to in house rep.     [id number] Rosana Hoes, RN, BSN, CCM Chart reviewed. Discharge needs and patient's stay to be reviewed and followed by case manager. chart note: Increased wob, bronchospastic, ultrasound chest with very small rt effusion and sliver on left, not tapped. Diuresis and Start steroids on 10/30. She is at some risk for intubation.     [id number] Rosana Hoes, RN, BSN, CCM Chart reviewed. Discharge needs and patient's stay to be reviewed and followed by case manager. note from chart: 58 y.o. female history of metastatic breast cancer, COPD and hypertension was referred to  the ER from the [facility] after patient was found to be weak and confused. Patient had gone to her regular chemotherapy and was found to be confused and weak.  In the ER, she was febrile and complaining of right lower chest pain, productive cough, and possible abdominal pain.  Chest x-ray demonstrated bilateral atelectasis versus infiltrates and CT abdomen and pelvis was unremarkable. Patient has been started on empiric antibiotics for possible sepsis and admitted to stepdown.

## 2014-04-19 NOTE — Discharge Summary (Signed)
Physician Discharge Summary  [name] FIE:[id number] DOB: [birth year] DOA: 04/10/2014  PCP: Lorayne Marek, MD  Admit date: 04/10/2014 Discharge date: 04/19/2014  Time spent: 35 minutes  Recommendations for Outpatient Follow-up:  1. Needs   Discharge Diagnoses:    Sepsis   PNA   Encephalopathy   COPD (chronic obstructive pulmonary disease)   Breast cancer metastasized to bone   Hypertension   Septic shock   Acute on chronic respiratory failure   Acute on chronic respiratory failure with hypoxia   Constipation   Leukocytosis   Discharge Condition: Stable  Diet recommendation: Heart Healthy  Filed Weights   04/12/14 0400 04/14/14 0500 04/15/14 0600  Weight: 52.4 kg (115 lb 8.3 oz) 53.9 kg (118 lb 13.3 oz) 52.2 kg (115 lb 1.3 oz)    History of present illness:  58 year old with PMH significant for Metastatic breast cancer, COPD presents with weakness, confusion, admitted with sepsis, PNA.   Hospital Course:  Brief Summary  [name] is a 58 y.o. female history of metastatic breast cancer, COPD and hypertension was referred to the ER from the [facility] after patient was found to be weak and confused. Patient had gone to her regular chemotherapy and was found to be confused and weak. In the ER, she was febrile and complaining of right lower chest pain, productive cough, and possible abdominal pain. Chest x-ray demonstrated bilateral atelectasis versus infiltrates and CT abdomen and pelvis was unremarkable. Patient has been started on empiric antibiotics for possible sepsis and admitted to stepdown.  Assessment/Plan Sepsis, probably from pneumonia .  - Received vancomycin for 7 and cefepime 7. Received flagyl for 4 days. Flagyl discontinue 10-30.  - CT angio negative for PE on admission.  - blood cultures no growth to date.  - Legionella and strep pneumo ag negative.  - Flu PCR negative - Sputum culture if able - UA neg - CT abd/pelvis neg  10-26.  -WBC increased 10-31 but she was started on Solumedrol 10-30.  -continue with same regimen of antibiotics.  -WBC trending down, patient on steroids. Afebrile.  -Started oral Levaquin. Needs 10 day treatment.   Abdominal Pain: Right side, abdomen. She had CT abdomen on admission which was negative other than bile duct dilation. LFT not significant elevated. Pain is better with lidoycane patch.    Acute Hypoxic respiratory failure; multifactorial: PNA, COPD, metastasis diseases.  -BIPAP PRN.  -Received IV lasix overnight 10-28.  -NSL fluids.  -CT angio negative for PE. Stable mediastinal adenopathy and right suprahilar mass is noted consistent with metastatic disease. -increase oxygen requirement on 10-29.  received 60 mg lasix 10-30. Tachycardia got worse.  -Chest x ray 10-31; Rapid improvement in the bilateral pulmonary edema. Diminished small  bilateral pleural effusions. -stable. Improved. Evaluation for home oxygen.  Prednisone taper.   Left DVT Gastrocnemius Vein: Continue with Lovenox. Pharmacy to dose. Patient agree to continue with lovenox at discharge.   Sinus Tachycardia; stable. Asymptomatic. Responded to fluids and blood transufion.   Chest pain; on and off, since admission. Troponin negative. Continue with protonix. CT angio on admission negative,   Constipation, Ileus.; resolved. Has had multiple BM.  Tolerating regular diet.  KUB 10-30. Mild gaseous distended small bowel loops probable ileus. Stool in right colon. Moderate colonic gaseous distension. Continue with miralax, docusate. Dulcolax suppository PRN.   Acute encephalopathy due to sepsis and high fever - will resume flexeril PRN, careful with oversedation.  - Treat infection  Generalized weakness from infection and dehydration.  IVF and abx - PT/OT consults  Metastatic breast cancer - Dr. Jana Hakim on rounding list  Hypercalcemia likely secondary to dehydration and already  improved with IVF  HTN with borderline low BPs - Hold BP medications  COPD;  - Continue flovent - duonebs with albuterol prn  Leukocytosis due to sepsis, continue with Abx.  Anemia of chronic disease, hemoglobin trending down with IVF - Transfuse for hgb < 7 or symptomatic anemia - Hb stable at 9. Received one unit PRBC.   Metabolic acidosis with mild gap likely due to ketosis and dehydration. Lactic acid was normal. Improving with IVF Resolved.   Sick euthyroid - Repeat TSH in 4-6 weeks  Procedures:  none  Consultations:  CCM  [place]  Discharge Exam: Filed Vitals:   04/19/14 0436  BP: 133/77  Pulse:   Temp: 98.2 F (36.8 C)  Resp: 21    General: Alert in no distress.  Cardiovascular: S 1, S 2 RRR Respiratory: CTA Abdomen; soft, mild right side pain, ribs  Discharge Instructions You were cared for by a hospitalist during your hospital stay. If you have any questions about your discharge medications or the care you received while you were in the hospital after you are discharged, you can call the unit and asked to speak with the hospitalist on call if the hospitalist that took care of you is not available. Once you are discharged, your primary care physician will handle any further medical issues. Please note that NO REFILLS for any discharge medications will be authorized once you are discharged, as it is imperative that you return to your primary care physician (or establish a relationship with a primary care physician if you do not have one) for your aftercare needs so that they can reassess your need for medications and monitor your lab values.  Discharge Instructions    Diet - low sodium heart healthy    Complete by:  As directed      Diet - low sodium heart healthy    Complete by:  As directed      Increase activity slowly    Complete by:  As directed      Increase activity slowly    Complete by:  As directed           Current Discharge  Medication List    START taking these medications   Details  arformoterol (BROVANA) 15 MCG/2ML NEBU Take 2 mLs (15 mcg total) by nebulization 2 (two) times daily. Qty: 120 mL, Refills: 0    bisacodyl (DULCOLAX) 10 MG suppository Place 1 suppository (10 mg total) rectally daily as needed for moderate constipation. Qty: 12 suppository, Refills: 0    budesonide (PULMICORT) 0.5 MG/2ML nebulizer solution Take 2 mLs (0.5 mg total) by nebulization every 12 (twelve) hours. Qty: 100 mL, Refills: 3    enoxaparin (LOVENOX) 80 MG/0.8ML injection Inject 0.8 mLs (80 mg total) into the skin daily. Qty: 30 Syringe, Refills: 1    levalbuterol (XOPENEX) 0.63 MG/3ML nebulizer solution Take 3 mLs (0.63 mg total) by nebulization every 3 (three) hours as needed for wheezing or shortness of breath. Qty: 3 mL, Refills: 12    levofloxacin (LEVAQUIN) 750 MG tablet Take 1 tablet (750 mg total) by mouth daily. Qty: 5 tablet, Refills: 0    lidocaine (LIDODERM) 5 % Place 1 patch onto the skin daily. Remove & Discard patch within 12 hours or as directed by MD Qty: 30 patch, Refills: 0    polyethylene glycol (MIRALAX /  GLYCOLAX) packet Take 17 g by mouth 2 (two) times daily. Qty: 14 each, Refills: 0    predniSONE (DELTASONE) 20 MG tablet Take 2 tablet for 3 days then take 1 tablet for 3 days then half tablet for one day then stop. Qty: 11 tablet, Refills: 0      CONTINUE these medications which have CHANGED   Details  oxyCODONE-acetaminophen (ROXICET) 5-325 MG per tablet Take 1-2 tablets by mouth every 4 (four) hours as needed. Qty: 50 tablet, Refills: 0      CONTINUE these medications which have NOT CHANGED   Details  butalbital-acetaminophen-caffeine (FIORICET) 50-325-40 MG per tablet Take 1-2 tablets by mouth every 6 (six) hours as needed for headache. Qty: 20 tablet, Refills: 0    carbamide peroxide (DEBROX) 6.5 % otic solution Place 5 drops into both ears 2 (two) times daily as needed.     cholecalciferol (VITAMIN D) 1000 UNITS tablet Take 2,000 Units by mouth daily.    cyclobenzaprine (FLEXERIL) 5 MG tablet Take 1 tablet (5 mg total) by mouth 3 (three) times daily as needed for muscle spasms. Qty: 30 tablet, Refills: 0    fluticasone (FLOVENT HFA) 220 MCG/ACT inhaler Inhale 1 puff into the lungs 2 (two) times daily. Qty: 3 Inhaler, Refills: 3    hydrocortisone (ANUSOL-HC) 25 MG suppository Place 25 mg rectally 2 (two) times daily as needed.    lidocaine-prilocaine (EMLA) cream Apply 1 application topically as needed. Apply over port area 1-2 hours before chemo, cover with plastic wrap Qty: 30 g, Refills: 0    LORazepam (ATIVAN) 0.5 MG tablet Take 1 tablet (0.5 mg total) by mouth every 8 (eight) hours. Qty: 30 tablet, Refills: 0    ondansetron (ZOFRAN) 8 MG tablet Take 1 tablet (8 mg total) by mouth 2 (two) times daily. Start the day after chemo for 2 days. Then take as needed for nausea or vomiting. Qty: 30 tablet, Refills: 1   Associated Diagnoses: Breast cancer metastasized to brain, right; Breast cancer metastasized to bone, right; Breast cancer of upper-outer quadrant of right female breast    pantoprazole (PROTONIX) 40 MG tablet Take 1 tablet (40 mg total) by mouth daily. Qty: 30 tablet, Refills: 2   Associated Diagnoses: Breast cancer of upper-outer quadrant of right female breast    prochlorperazine (COMPAZINE) 10 MG tablet Take 1 tablet (10 mg total) by mouth every 6 (six) hours as needed (Nausea or vomiting). Qty: 30 tablet, Refills: 1   Associated Diagnoses: Breast cancer metastasized to brain, right; Breast cancer metastasized to bone, right; Breast cancer of upper-outer quadrant of right female breast    amLODipine (NORVASC) 2.5 MG tablet Take 1 tablet (2.5 mg total) by mouth daily. Qty: 90 tablet, Refills: 3   Associated Diagnoses: Essential hypertension, benign      STOP taking these medications     albuterol (PROVENTIL HFA;VENTOLIN HFA) 108 (90  BASE) MCG/ACT inhaler      Diphenhyd-Hydrocort-Nystatin (FIRST-DUKES MOUTHWASH) SUSP        Allergies  Allergen Reactions  . Penicillins Other (See Comments)    Reaction a long time ago   Follow-up Information    Follow up with Lorayne Marek, MD In 1 week.   Specialty:  Internal Medicine   Contact information:   [address] [place] [address] [phone]       Follow up with Chauncey Cruel, MD.   Specialty:  Oncology   Why:  keep appointment.    Contact information:   [address] [address] [phone]       Follow up with Lorayne Marek, MD.   Specialty:  Internal Medicine   Contact information:   [address] [place] [address] [phone]        The results of significant diagnostics from this hospitalization (including imaging, microbiology, ancillary and laboratory) are listed below for reference.    Significant Diagnostic Studies: Dg Chest 2 View (if Patient Has Fever And/or Copd)  04/03/2014   CLINICAL DATA:  58 year old female with current history of breast cancer diagnosed in July, metastatic disease to brain and bone. Ongoing chemotherapy. Generalized pain including mid chest pain. Shortness of breath and cough. Initial encounter.  EXAM: CHEST  2 VIEW  COMPARISON:  CT chest and CT Abdomen and Pelvis from 0841 hr today, and earlier.  FINDINGS: Stable left chest porta cath. Lower lung volumes with linear bibasilar opacity which most resembles atelectasis. Right suprahilar mass shows radiographic regression since 12/30/2013. No pneumothorax or pulmonary edema. Suggestion of small pleural effusions on these films, but there was non by CT earlier today. Stable cardiac size and mediastinal contours. Visualized tracheal air column is within normal limits.  Stable visualized osseous structures. Retained oral contrast in the colon.  IMPRESSION: Lower lung volumes with bibasilar atelectasis, and possibly new small pleural  effusions since the chest abdomen and pelvis CTs done at 0841 hr today. See also that CT report.   Electronically Signed   By: Lars Pinks M.D.   On: 04/03/2014 20:13   Dg Abd 1 View  04/18/2014   CLINICAL DATA:  Right lower abdominal pain.  EXAM: ABDOMEN - 1 VIEW  COMPARISON:  04/13/2014  FINDINGS: Mild gaseous scratch sec gas throughout mildly prominent large and small bowel. Findings similar to prior study. No free air. No organomegaly. Visualized lung bases are clear.  IMPRESSION: Stable mild diffuse gaseous distention of bowel, suspect mild ileus.   Electronically Signed   By: Rolm Baptise M.D.   On: 04/18/2014 15:26   Ct Head Wo Contrast  04/10/2014   CLINICAL DATA:  Stage IV breast cancer.  Confusion  EXAM: CT HEAD WITHOUT CONTRAST  TECHNIQUE: Contiguous axial images were obtained from the base of the skull through the vertex without intravenous contrast.  COMPARISON:  MRI 03/31/2012  FINDINGS: Left frontal parietal cortical enhancing lesion on MRI is noted. There is adjacent vasogenic edema on the CT which is unchanged from the MRI. No associated hemorrhage.  No other areas of mass or edema identified. No acute ischemic infarct. Ventricle size normal. Negative for hemorrhage  Mucoperiosteal thickening in the left sphenoid sinus compatible with chronic sinusitis.  IMPRESSION: Mild edema in the left frontal parietal white matter consistent with known metastatic deposit. No hemorrhage or other acute abnormality identified.   Electronically Signed   By: Franchot Gallo M.D.   On: 04/10/2014 16:50   Ct Chest W Contrast  04/03/2014   CLINICAL DATA:  Subsequent treatment strategy for breast cancer. Right breast cancer diagnosed July, 2015 with metastatic disease to brain and bone. Ongoing chemotherapy. Prior left breast cancer in 2009.  EXAM: CT CHEST, ABDOMEN, AND PELVIS WITH CONTRAST  TECHNIQUE: Multidetector CT imaging of the chest, abdomen and pelvis was performed following the standard protocol during  bolus administration of intravenous contrast.  CONTRAST:  66mL OMNIPAQUE IOHEXOL 300 MG/ML  SOLN  COMPARISON:  Multiple exams, including 01/05/2014  FINDINGS: CT CHEST FINDINGS  5 mm hypodense right thyroid nodule.  Thoracic adenopathy observed  with conglomerate right lower paratracheal nodes have a short axis diameter of 1.2 cm (formerly 1.3 cm on the PET-CT of 01/05/2014) on image 18 of series 2. The right upper lobe pulmonary nodule measures 2.2 by 1.9 cm on image 19 of series 4 (formerly 2.3 by 1.7 cm). A right upper paratracheal lymph node measures 0.6 cm in short axis on image 13 of series 2.  A right axillary lymph node which previously had a short axis parenchymal thickness of 1.2 cm currently has a short axis parenchymal thickness of 0.6 cm on image 12 of series 2 (this excludes the fatty hilum).  The hypermetabolic right breast mass is less bulky in size and indistinctly marginated on today's exam. The degree of skin thickening and subcutaneous edema in the vicinity of the mass is significantly reduced.  Rim calcified of 1.4 cm lesion with fatty center noted at the 12 o'clock position in the left breast just above the areola.  There is a subcarinal node measuring 1 cm in short axis, image 25 series 2, previously not readily measurable. A right infrahilar node just below the bronchus intermedius measures 1.2 cm in short axis (formerly 1.0 cm) but was not formerly hypermetabolic  A lower paraesophageal lymph node measures 1.0 cm in short axis on image 37 of series 2 (formerly 0.9 cm and not previously discernibly hypermetabolic).  Centrilobular emphysema. The known scattered osseous metastatic lesions throughout the thoracic skeleton rib fairly inconspicuous on CT, and despite being widespread are fairly difficult to compare to prior exams given the lack of easily identified individual lesions. Mild atelectasis or scarring in both lower lobes along the hemidiaphragms.  CT ABDOMEN AND PELVIS FINDINGS   Hepatobiliary: Dilated CBD at 1.7 cm with blunt termination near the ampulla. Dorsal pancreatic duct is not dilated. Equivocal intrahepatic biliary dilatation. Gallbladder surgically absent. Diffuse hepatic steatosis.  Pancreas: Unremarkable  Spleen: Unremarkable  Adrenals/Urinary Tract: Unremarkable  Stomach/Bowel: Sigmoid diverticulosis without active diverticulitis. Prominent stool throughout the colon favors constipation.  Vascular/Lymphatic: Aortoiliac atherosclerotic vascular disease. 0.7 cm left external iliac lymph node, not previously hypermetabolic, similar in size to prior.  Reproductive: Uterus absent.  Ovaries not well seen.  Other: No supplemental non-categorized findings.  Musculoskeletal: As in the chest, there is vague heterogeneity in the osseous structures indicative of osseous metastatic disease, but difficult to quantify. Bone scan would probably be a better way to followup.  IMPRESSION: 1. Primarily improved appearance, with significant reduction in size of the right breast mass and in the surrounding edema, and considerable reduction in the right hilar and subpectoral adenopathy. 2. Essentially stable appearance of the right upper lobe pulmonary nodule the and similar appearance of the right lower paratracheal adenopathy. This may be a lung primary. 3. Slight enlargement of a right infrahilar lymph node currently 1.2 cm in short axis, but not formerly hypermetabolic. Equivocal enlargement of a lower periesophageal lymph node in the thorax. 4. Aside from the indistinct diffuse osseous metastatic disease, no specific findings of malignancy in the abdomen/pelvis. 5. Centrilobular emphysema. 6. Stably dilated common bile duct with blunt termination near the ampulla. Cannot completely exclude a stricture although a component of the biliary dilatation may be due to prior cholecystectomy. 7. Sigmoid diverticulosis. 8.  Prominent stool throughout the colon favors constipation.   Electronically Signed    By: Sherryl Barters M.D.   On: 04/03/2014 09:12   Ct Angio Chest Pe W/cm &/or Wo Cm  04/11/2014   CLINICAL DATA:  Chest pain.  Current history  of breast cancer.  EXAM: CT ANGIOGRAPHY CHEST WITH CONTRAST  TECHNIQUE: Multidetector CT imaging of the chest was performed using the standard protocol during bolus administration of intravenous contrast. Multiplanar CT image reconstructions and MIPs were obtained to evaluate the vascular anatomy.  CONTRAST:  167mL OMNIPAQUE IOHEXOL 350 MG/ML SOLN  COMPARISON:  CT scan of April 03, 2014.  FINDINGS: Minimal bilateral pleural effusions are noted with adjacent subsegmental atelectasis. No pneumothorax is noted. 19 x 16 mm right suprahilar mass is again noted consistent with malignancy or metastatic disease. There is no evidence of pulmonary embolus. There is no evidence of thoracic aortic dissection or aneurysm. Stable prevascular, sub carinal, right hilar and right peritracheal adenopathy consistent with metastatic disease. Left subclavian Port-A-Cath is noted with distal tip at the cavoatrial junction. Visualized portion of upper abdomen appears normal. Stable right breast mass.  Review of the MIP images confirms the above findings.  IMPRESSION: There is no evidence of pulmonary embolus.  Minimal bilateral pleural effusions are noted with adjacent subsegmental atelectasis.  Stable mediastinal adenopathy and right suprahilar mass is noted consistent with metastatic disease.   Electronically Signed   By: Sabino Dick M.D.   On: 04/11/2014 10:42   Mr Jeri Cos QZ Contrast  03/31/2014   CLINICAL DATA:  Right breast cancer. Metastatic right axillary lymph node. Metastatic disease the brain. SRS.  EXAM: MRI HEAD WITHOUT AND WITH CONTRAST  TECHNIQUE: Multiplanar, multiecho pulse sequences of the brain and surrounding structures were obtained without and with intravenous contrast.  CONTRAST:  74mL MULTIHANCE GADOBENATE DIMEGLUMINE 529 MG/ML IV SOLN  COMPARISON:  CT head  without contrast 8/6 teen/ 15 and 01/06/2014.  FINDINGS: The previously noted left frontal lobe mass is decreased in size, now measuring 7 x 11 x 8 mm. Adjacent edema has decreased as well. Periventricular white matter changes bilaterally are otherwise stable. No additional enhancing lesions are present. No acute infarct or hemorrhage is evident.  Flow is present in the major intracranial arteries. The globes and orbits are intact. Mild mucosal thickening is present in the anterior ethmoid air cells and frontal sinuses. The mastoid air cells are clear.  IMPRESSION: 1. Decreased size of left frontal lobe mass lesion, now measuring 7 x 11 x 8 mm. 2. No new lesions. 3. Stable periventricular white matter changes.   Electronically Signed   By: Lawrence Santiago M.D.   On: 03/31/2014 14:23   Ct Abdomen Pelvis W Contrast  04/10/2014   CLINICAL DATA:  59 year old female with generalized abdominal pain and weakness. Stage IV breast cancer.  EXAM: CT ABDOMEN AND PELVIS WITH CONTRAST  TECHNIQUE: Multidetector CT imaging of the abdomen and pelvis was performed using the standard protocol following bolus administration of intravenous contrast.  CONTRAST:  68mL OMNIPAQUE IOHEXOL 300 MG/ML SOLN, 131mL OMNIPAQUE IOHEXOL 300 MG/ML SOLN  COMPARISON:  CT of the chest, abdomen and pelvis 03/24/2014.  FINDINGS: Lower chest: Dependent atelectasis in lung bases bilaterally. Paraesophageal lymphadenopathy again noted measuring up to 1 cm in short axis.  Hepatobiliary: No focal cystic or solid hepatic lesions. Trace intrahepatic biliary ductal dilatation (unchanged). Common bile duct remains dilated in the porta hepatis measuring 15 mm on today's examination (decreased compared to prior). Status post cholecystectomy.  Pancreas: Atrophic, but otherwise unremarkable.  Spleen: Unremarkable.  Adrenals/Urinary Tract: Bilateral adrenal glands and bilateral kidneys are normal in appearance. No hydronephrosis to suggest urinary tract obstruction  at this time. Foley balloon catheter in the lumen of the urinary bladder, which is completely  decompressed.  Stomach/Bowel: The stomach is normal in appearance. No pathologic dilatation of small bowel or colon. Numerous colonic diverticulae are noted, without surrounding inflammatory changes to suggest an acute diverticulitis at this time.  Vascular/Lymphatic: Extensive atherosclerosis throughout the abdominal and pelvic vasculature, without evidence of aneurysm or dissection. Retroaortic left renal vein. Numerous reactive size lymph nodes are noted throughout the retroperitoneum (nonspecific). No pathologically enlarged lymph nodes are noted in the abdomen or pelvis.  Reproductive: Status post hysterectomy. Ovaries are not confidently identified may be surgically absent or atrophic.  Other: No significant volume of ascites.  No pneumoperitoneum.  Musculoskeletal: Bones are diffusely heterogeneous in appearance with innumerable small lytic lesions, compatible with widespread metastatic disease throughout the skeleton.  IMPRESSION: 1. No acute findings in the abdomen or pelvis. 2. Persistent very mild intrahepatic biliary ductal dilatation, and decreasing extrahepatic biliary ductal dilatation. This is favored to represent "functional" dilatation in this post cholecystectomy its patient, however, correlation with liver function tests is recommended. The possibility of a distal ductal stricture or tiny ampullary lesion is difficult to entirely exclude, particularly if there are elevated liver function tests. 3. Colonic diverticulosis without findings to suggest acute diverticulitis at this time. 4. Interval placement of Foley catheter in the urinary bladder which appears properly located. 5. Additional incidental findings, as above, similar to the recent prior examination.   Electronically Signed   By: Vinnie Langton M.D.   On: 04/10/2014 20:04   Ct Abdomen Pelvis W Contrast  04/03/2014   CLINICAL DATA:   Subsequent treatment strategy for breast cancer. Right breast cancer diagnosed July, 2015 with metastatic disease to brain and bone. Ongoing chemotherapy. Prior left breast cancer in 2009.  EXAM: CT CHEST, ABDOMEN, AND PELVIS WITH CONTRAST  TECHNIQUE: Multidetector CT imaging of the chest, abdomen and pelvis was performed following the standard protocol during bolus administration of intravenous contrast.  CONTRAST:  55mL OMNIPAQUE IOHEXOL 300 MG/ML  SOLN  COMPARISON:  Multiple exams, including 01/05/2014  FINDINGS: CT CHEST FINDINGS  5 mm hypodense right thyroid nodule.  Thoracic adenopathy observed with conglomerate right lower paratracheal nodes have a short axis diameter of 1.2 cm (formerly 1.3 cm on the PET-CT of 01/05/2014) on image 18 of series 2. The right upper lobe pulmonary nodule measures 2.2 by 1.9 cm on image 19 of series 4 (formerly 2.3 by 1.7 cm). A right upper paratracheal lymph node measures 0.6 cm in short axis on image 13 of series 2.  A right axillary lymph node which previously had a short axis parenchymal thickness of 1.2 cm currently has a short axis parenchymal thickness of 0.6 cm on image 12 of series 2 (this excludes the fatty hilum).  The hypermetabolic right breast mass is less bulky in size and indistinctly marginated on today's exam. The degree of skin thickening and subcutaneous edema in the vicinity of the mass is significantly reduced.  Rim calcified of 1.4 cm lesion with fatty center noted at the 12 o'clock position in the left breast just above the areola.  There is a subcarinal node measuring 1 cm in short axis, image 25 series 2, previously not readily measurable. A right infrahilar node just below the bronchus intermedius measures 1.2 cm in short axis (formerly 1.0 cm) but was not formerly hypermetabolic  A lower paraesophageal lymph node measures 1.0 cm in short axis on image 37 of series 2 (formerly 0.9 cm and not previously discernibly hypermetabolic).  Centrilobular  emphysema. The known scattered osseous metastatic lesions throughout the  thoracic skeleton rib fairly inconspicuous on CT, and despite being widespread are fairly difficult to compare to prior exams given the lack of easily identified individual lesions. Mild atelectasis or scarring in both lower lobes along the hemidiaphragms.  CT ABDOMEN AND PELVIS FINDINGS  Hepatobiliary: Dilated CBD at 1.7 cm with blunt termination near the ampulla. Dorsal pancreatic duct is not dilated. Equivocal intrahepatic biliary dilatation. Gallbladder surgically absent. Diffuse hepatic steatosis.  Pancreas: Unremarkable  Spleen: Unremarkable  Adrenals/Urinary Tract: Unremarkable  Stomach/Bowel: Sigmoid diverticulosis without active diverticulitis. Prominent stool throughout the colon favors constipation.  Vascular/Lymphatic: Aortoiliac atherosclerotic vascular disease. 0.7 cm left external iliac lymph node, not previously hypermetabolic, similar in size to prior.  Reproductive: Uterus absent.  Ovaries not well seen.  Other: No supplemental non-categorized findings.  Musculoskeletal: As in the chest, there is vague heterogeneity in the osseous structures indicative of osseous metastatic disease, but difficult to quantify. Bone scan would probably be a better way to followup.  IMPRESSION: 1. Primarily improved appearance, with significant reduction in size of the right breast mass and in the surrounding edema, and considerable reduction in the right hilar and subpectoral adenopathy. 2. Essentially stable appearance of the right upper lobe pulmonary nodule the and similar appearance of the right lower paratracheal adenopathy. This may be a lung primary. 3. Slight enlargement of a right infrahilar lymph node currently 1.2 cm in short axis, but not formerly hypermetabolic. Equivocal enlargement of a lower periesophageal lymph node in the thorax. 4. Aside from the indistinct diffuse osseous metastatic disease, no specific findings of malignancy  in the abdomen/pelvis. 5. Centrilobular emphysema. 6. Stably dilated common bile duct with blunt termination near the ampulla. Cannot completely exclude a stricture although a component of the biliary dilatation may be due to prior cholecystectomy. 7. Sigmoid diverticulosis. 8.  Prominent stool throughout the colon favors constipation.   Electronically Signed   By: Sherryl Barters M.D.   On: 04/03/2014 09:12   Dg Chest Port 1 View  04/15/2014   CLINICAL DATA:  Hypoxemia.  EXAM: PORTABLE CHEST - 1 VIEW  COMPARISON:  04/14/2014 and 04/13/2014 and CT scan dated 04/11/2014  FINDINGS: Power port in place, unchanged. There has been rapid clearing of the bilateral infiltrates and improvement in the bilateral small effusions. The findings probably represent pulmonary edema.  Spiculated 3 cm mass is noted in the right upper lobe. Heart size and vascularity are normal.  IMPRESSION: Rapid improvement in the bilateral pulmonary edema. Diminished small bilateral pleural effusions.   Electronically Signed   By: Rozetta Nunnery M.D.   On: 04/15/2014 05:37   Dg Chest Port 1 View  04/14/2014   CLINICAL DATA:  Respiratory failure and right chest pain. Shortness of breath.  EXAM: PORTABLE CHEST - 1 VIEW  COMPARISON:  04/13/2014  FINDINGS: Port-A-Cath tip in the SVC region. Increased densities at both lung bases may represent worsening atelectasis or pleural fluid. Again noted are prominent lung markings. Heart size is stable. Negative for a pneumothorax. Probable scarring at the left lung apex.  IMPRESSION: Increased basilar densities may represent worsening atelectasis or pleural effusions.  Prominent lung markings suggest mild edema.   Electronically Signed   By: Markus Daft M.D.   On: 04/14/2014 07:32   Dg Chest Port 1 View  04/13/2014   CLINICAL DATA:  Respiratory failure, hypoxia  EXAM: PORTABLE CHEST - 1 VIEW  COMPARISON:  04/04/2014  FINDINGS: Cardiomediastinal silhouette is stable. Slight improvement in aeration. Mild  interstitial prominence bilaterally without convincing pulmonary  edema. Streaky bilateral basilar atelectasis or infiltrate. Stable left subclavian Port-A-Cath position.  IMPRESSION: Slight improvement in aeration. Mild interstitial prominence bilaterally without convincing pulmonary edema. Streaky bilateral basilar atelectasis or infiltrate.   Electronically Signed   By: Lahoma Crocker M.D.   On: 04/13/2014 11:39   Dg Chest Port 1 View  04/12/2014   CLINICAL DATA:  Effusions, breast cancer.  EXAM: PORTABLE CHEST - 1 VIEW  COMPARISON:  04/11/2014 chest CT  FINDINGS: Known right suprahilar nodule and mediastinal/right hilar lymphadenopathy, better characterized on recent CT. Bibasilar airspace opacities and small right greater than left pleural effusion, increased in the interval. Interstitial prominence. Left chest wall Port-A-Cath with tip projecting over the mid SVC. No interval osseous change.  IMPRESSION: Small right greater than left pleural effusions, increased in the interval.  Bibasilar opacities; atelectasis (favored) versus infiltrate.  Interstitial prominence in part reflects COPD. Superimposed interstitial edema not excluded.  Known right upper lobe mass and adenopathy, better characterized on recent CT.   Electronically Signed   By: Carlos Levering M.D.   On: 04/12/2014 06:35   Dg Chest Port 1 View  04/10/2014   CLINICAL DATA:  Generalized weakness, shortness of breath, fever, and confusion, history stage IV breast cancer  EXAM: PORTABLE CHEST - 1 VIEW  COMPARISON:  Portable exam 1533 hr compared to 04/03/2014  FINDINGS: RIGHT jugular Port-A-Cath with tip projecting over SVC near cavoatrial junction.  Borderline enlargement of cardiac silhouette.  Atherosclerotic calcification aorta.  Mediastinal contours and pulmonary vascularity normal.  Accentuated interstitial markings in part at least due to differences in technique.  Bibasilar atelectasis versus infiltrates, increased.  Upper lungs clear.   Questionable tiny LEFT pleural effusion.  No pneumothorax.  Bones demineralized.  IMPRESSION: Bibasilar atelectasis versus infiltrates.   Electronically Signed   By: Lavonia Dana M.D.   On: 04/10/2014 16:00   Dg Abd Portable 1v  04/13/2014   CLINICAL DATA:  severe abdominal pain and constipation  EXAM: PORTABLE ABDOMEN - 1 VIEW  COMPARISON:  04/10/2014  FINDINGS: No small bowel air-fluid levels are noted. Mild gaseous distended small bowel loops probable ileus. There is moderate stool in right colon. Residual contrast material in right colon and proximal left colon. Moderate gaseous distension of transverse colon and rectosigmoid colon. No free abdominal air.  IMPRESSION: No small bowel air-fluid levels. Mild gaseous distended small bowel loops probable ileus. Stool in right colon. Moderate colonic gaseous distension.   Electronically Signed   By: Lahoma Crocker M.D.   On: 04/13/2014 09:38    Microbiology: Recent Results (from the past 240 hour(s))  TECHNOLOGIST REVIEW     Status: None   Collection Time: 04/10/14  2:27 PM  Result Value Ref Range Status   Technologist Review 2% Nrbcs, 3% metamyelocytes  Final  Blood Culture (routine x 2)     Status: None   Collection Time: 04/10/14  3:20 PM  Result Value Ref Range Status   Specimen Description BLOOD LEFT CHEST  Final   Special Requests BOTTLES DRAWN AEROBIC AND ANAEROBIC 5 CC EA  Final   Culture  Setup Time   Final    04/10/2014 21:17 Performed at [facility]    Culture   Final    NO GROWTH 5 DAYS Performed at [facility]    Report Status 04/16/2014 FINAL  Final  Blood Culture (routine x 2)     Status: None   Collection Time: 04/10/14  3:25 PM  Result Value Ref Range Status  Specimen Description BLOOD LEFT ARM  Final   Special Requests BOTTLES DRAWN AEROBIC AND ANAEROBIC 8CC  Final   Culture  Setup Time   Final    04/11/2014 03:55 Performed at [place]   Final    NO GROWTH 5 DAYS Performed at  [facility]    Report Status 04/17/2014 FINAL  Final  Urine culture     Status: None   Collection Time: 04/10/14  3:37 PM  Result Value Ref Range Status   Specimen Description URINE, CATHETERIZED  Final   Special Requests NONE  Final   Culture  Setup Time   Final    04/10/2014 21:31 Performed at [place] Performed at [facility]  Final   Culture NO GROWTH Performed at [facility]  Final   Report Status 04/11/2014 FINAL  Final  MRSA PCR Screening     Status: None   Collection Time: 04/10/14 10:20 PM  Result Value Ref Range Status   MRSA by PCR NEGATIVE NEGATIVE Final    Comment:        The GeneXpert MRSA Assay (FDA approved for NASAL specimens only), is one component of a comprehensive MRSA colonization surveillance program. It is not intended to diagnose MRSA infection nor to guide or monitor treatment for MRSA infections.  Culture, blood (routine x 2)     Status: None   Collection Time: 04/11/14  5:50 PM  Result Value Ref Range Status   Specimen Description BLOOD LEFT ARM  Final   Special Requests BOTTLES DRAWN AEROBIC AND ANAEROBIC  10 CC  Final   Culture  Setup Time   Final    04/11/2014 22:33 Performed at [facility]    Culture   Final    NO GROWTH 5 DAYS Performed at [facility]    Report Status 04/17/2014 FINAL  Final  Culture, blood (routine x 2)     Status: None   Collection Time: 04/11/14  5:59 PM  Result Value Ref Range Status   Specimen Description BLOOD LEFT ARM  Final   Special Requests BOTTLES DRAWN AEROBIC AND ANAEROBIC  10 CC  Final   Culture  Setup Time   Final    04/11/2014 22:33 Performed at [facility]    Culture   Final    NO GROWTH 5 DAYS Note: Culture results may be compromised due to an excessive volume of blood received in culture bottles. Performed at [facility]    Report Status 04/17/2014 FINAL  Final     Labs: Basic Metabolic  Panel:  Recent Labs Lab 04/14/14 0135 04/14/14 1640 04/15/14 0505 04/16/14 0845 04/17/14 0455  NA 137 137 138 137 138  K 4.4 4.0 3.9 3.7 4.2  CL 103 99 100 102 103  CO2 20 22 24 21 20   GLUCOSE 106* 104* 142* 125* 179*  BUN 16 13 16 20  24*  CREATININE 0.69 0.66 0.49* 0.47* 0.54  CALCIUM 9.6 10.6* 10.4 9.4 9.3  MG  --  1.7  --   --   --    Liver Function Tests: No results for input(s): AST, ALT, ALKPHOS, BILITOT, PROT, ALBUMIN in the last 168 hours. No results for input(s): LIPASE, AMYLASE in the last 168 hours. No results for input(s): AMMONIA in the last 168 hours. CBC:  Recent Labs Lab 04/15/14 0505 04/16/14 0845 04/17/14 0455 04/18/14 0534 04/19/14 0355  WBC 20.8* 24.8* 19.9*  22.7* 19.0*  HGB 8.1* 7.8* 9.2* 9.5* 9.3*  HCT 24.8* 23.3* 27.7* 29.4* 28.9*  MCV 91.9 91.0 91.1 91.6 92.9  PLT 155 148* 125* 135* 114*   Cardiac Enzymes:  Recent Labs Lab 04/13/14 1330 04/13/14 1910 04/14/14 0135  TROPONINI <0.30 <0.30 <0.30   BNP: BNP (last 3 results)  Recent Labs  04/10/14 1537  PROBNP 298.3*   CBG: No results for input(s): GLUCAP in the last 168 hours.     Signed:  Niel Hummer A  [facility] 04/19/2014, 10:59 AM

## 2014-04-24 ENCOUNTER — Emergency Department (HOSPITAL_COMMUNITY): Payer: Medicaid Other

## 2014-04-24 ENCOUNTER — Inpatient Hospital Stay (HOSPITAL_COMMUNITY): Payer: Medicaid Other

## 2014-04-24 ENCOUNTER — Encounter (HOSPITAL_COMMUNITY): Payer: Self-pay | Admitting: Emergency Medicine

## 2014-04-24 ENCOUNTER — Inpatient Hospital Stay (HOSPITAL_COMMUNITY)
Admission: EM | Admit: 2014-04-24 | Discharge: 2014-05-03 | DRG: 871 | Disposition: A | Discharge status: Home Health | Payer: Medicaid Other | Attending: Internal Medicine | Admitting: Internal Medicine

## 2014-04-24 DIAGNOSIS — Z9981 Dependence on supplemental oxygen: Secondary | ICD-10-CM

## 2014-04-24 DIAGNOSIS — D6959 Other secondary thrombocytopenia: Secondary | ICD-10-CM | POA: Diagnosis present

## 2014-04-24 DIAGNOSIS — I10 Essential (primary) hypertension: Secondary | ICD-10-CM | POA: Diagnosis present

## 2014-04-24 DIAGNOSIS — K219 Gastro-esophageal reflux disease without esophagitis: Secondary | ICD-10-CM | POA: Diagnosis present

## 2014-04-24 DIAGNOSIS — E43 Unspecified severe protein-calorie malnutrition: Secondary | ICD-10-CM | POA: Diagnosis present

## 2014-04-24 DIAGNOSIS — Z79899 Other long term (current) drug therapy: Secondary | ICD-10-CM

## 2014-04-24 DIAGNOSIS — C7800 Secondary malignant neoplasm of unspecified lung: Secondary | ICD-10-CM | POA: Diagnosis present

## 2014-04-24 DIAGNOSIS — Z86718 Personal history of other venous thrombosis and embolism: Secondary | ICD-10-CM

## 2014-04-24 DIAGNOSIS — Z9049 Acquired absence of other specified parts of digestive tract: Secondary | ICD-10-CM | POA: Diagnosis present

## 2014-04-24 DIAGNOSIS — A419 Sepsis, unspecified organism: Secondary | ICD-10-CM | POA: Diagnosis present

## 2014-04-24 DIAGNOSIS — R6510 Systemic inflammatory response syndrome (SIRS) of non-infectious origin without acute organ dysfunction: Secondary | ICD-10-CM | POA: Diagnosis present

## 2014-04-24 DIAGNOSIS — I82402 Acute embolism and thrombosis of unspecified deep veins of left lower extremity: Secondary | ICD-10-CM

## 2014-04-24 DIAGNOSIS — G9341 Metabolic encephalopathy: Secondary | ICD-10-CM | POA: Diagnosis present

## 2014-04-24 DIAGNOSIS — C7951 Secondary malignant neoplasm of bone: Secondary | ICD-10-CM | POA: Diagnosis present

## 2014-04-24 DIAGNOSIS — C50411 Malignant neoplasm of upper-outer quadrant of right female breast: Secondary | ICD-10-CM | POA: Diagnosis present

## 2014-04-24 DIAGNOSIS — D6481 Anemia due to antineoplastic chemotherapy: Secondary | ICD-10-CM | POA: Diagnosis present

## 2014-04-24 DIAGNOSIS — Z87891 Personal history of nicotine dependence: Secondary | ICD-10-CM | POA: Diagnosis not present

## 2014-04-24 DIAGNOSIS — M546 Pain in thoracic spine: Secondary | ICD-10-CM

## 2014-04-24 DIAGNOSIS — Z923 Personal history of irradiation: Secondary | ICD-10-CM

## 2014-04-24 DIAGNOSIS — H9192 Unspecified hearing loss, left ear: Secondary | ICD-10-CM | POA: Diagnosis present

## 2014-04-24 DIAGNOSIS — J441 Chronic obstructive pulmonary disease with (acute) exacerbation: Secondary | ICD-10-CM | POA: Diagnosis present

## 2014-04-24 DIAGNOSIS — Z6823 Body mass index (BMI) 23.0-23.9, adult: Secondary | ICD-10-CM | POA: Diagnosis not present

## 2014-04-24 DIAGNOSIS — B952 Enterococcus as the cause of diseases classified elsewhere: Secondary | ICD-10-CM | POA: Diagnosis present

## 2014-04-24 DIAGNOSIS — J90 Pleural effusion, not elsewhere classified: Secondary | ICD-10-CM | POA: Diagnosis present

## 2014-04-24 DIAGNOSIS — R109 Unspecified abdominal pain: Secondary | ICD-10-CM | POA: Insufficient documentation

## 2014-04-24 DIAGNOSIS — D72829 Elevated white blood cell count, unspecified: Secondary | ICD-10-CM

## 2014-04-24 DIAGNOSIS — I82409 Acute embolism and thrombosis of unspecified deep veins of unspecified lower extremity: Secondary | ICD-10-CM

## 2014-04-24 DIAGNOSIS — J449 Chronic obstructive pulmonary disease, unspecified: Secondary | ICD-10-CM | POA: Diagnosis present

## 2014-04-24 DIAGNOSIS — R509 Fever, unspecified: Secondary | ICD-10-CM | POA: Diagnosis not present

## 2014-04-24 DIAGNOSIS — E876 Hypokalemia: Secondary | ICD-10-CM | POA: Diagnosis present

## 2014-04-24 DIAGNOSIS — J410 Simple chronic bronchitis: Secondary | ICD-10-CM

## 2014-04-24 DIAGNOSIS — T451X5A Adverse effect of antineoplastic and immunosuppressive drugs, initial encounter: Secondary | ICD-10-CM | POA: Diagnosis present

## 2014-04-24 DIAGNOSIS — C7931 Secondary malignant neoplasm of brain: Secondary | ICD-10-CM | POA: Diagnosis present

## 2014-04-24 DIAGNOSIS — R1011 Right upper quadrant pain: Secondary | ICD-10-CM

## 2014-04-24 DIAGNOSIS — Y95 Nosocomial condition: Secondary | ICD-10-CM | POA: Diagnosis present

## 2014-04-24 DIAGNOSIS — J189 Pneumonia, unspecified organism: Secondary | ICD-10-CM | POA: Diagnosis present

## 2014-04-24 DIAGNOSIS — R197 Diarrhea, unspecified: Secondary | ICD-10-CM

## 2014-04-24 DIAGNOSIS — IMO0001 Reserved for inherently not codable concepts without codable children: Secondary | ICD-10-CM | POA: Insufficient documentation

## 2014-04-24 DIAGNOSIS — D696 Thrombocytopenia, unspecified: Secondary | ICD-10-CM

## 2014-04-24 DIAGNOSIS — D649 Anemia, unspecified: Secondary | ICD-10-CM

## 2014-04-24 DIAGNOSIS — M549 Dorsalgia, unspecified: Secondary | ICD-10-CM | POA: Insufficient documentation

## 2014-04-24 LAB — COMPREHENSIVE METABOLIC PANEL
ALK PHOS: 101 U/L (ref 39–117)
ALT: 8 U/L (ref 0–35)
AST: 49 U/L — AB (ref 0–37)
Albumin: 2.1 g/dL — ABNORMAL LOW (ref 3.5–5.2)
Anion gap: 17 — ABNORMAL HIGH (ref 5–15)
BILIRUBIN TOTAL: 0.4 mg/dL (ref 0.3–1.2)
BUN: 14 mg/dL (ref 6–23)
CHLORIDE: 99 meq/L (ref 96–112)
CO2: 22 meq/L (ref 19–32)
CREATININE: 0.69 mg/dL (ref 0.50–1.10)
Calcium: 8.7 mg/dL (ref 8.4–10.5)
GFR calc Af Amer: >90 mL/min (ref >90)
Glucose, Bld: 68 mg/dL — ABNORMAL LOW (ref 70–99)
POTASSIUM: 3.4 meq/L — AB (ref 3.7–5.3)
Sodium: 138 mEq/L (ref 137–147)
Total Protein: 5.7 g/dL — ABNORMAL LOW (ref 6.0–8.3)

## 2014-04-24 LAB — CBC WITH DIFFERENTIAL/PLATELET
BASOS PCT: 1 % (ref 0–1)
Basophils Absolute: 0.2 10*3/uL — ABNORMAL HIGH (ref 0.0–0.1)
EOS PCT: 0 % (ref 0–5)
Eosinophils Absolute: 0 10*3/uL (ref 0.0–0.7)
HCT: 26 % — ABNORMAL LOW (ref 36.0–46.0)
HEMOGLOBIN: 8.5 g/dL — AB (ref 12.0–15.0)
Lymphocytes Relative: 14 % (ref 12–46)
Lymphs Abs: 2.1 10*3/uL (ref 0.7–4.0)
MCH: 29.8 pg (ref 26.0–34.0)
MCHC: 32.7 g/dL (ref 30.0–36.0)
MCV: 91.2 fL (ref 78.0–100.0)
MONO ABS: 0.6 10*3/uL (ref 0.1–1.0)
MONOS PCT: 4 % (ref 3–12)
NEUTROS PCT: 81 % — AB (ref 43–77)
Neutro Abs: 12.1 10*3/uL — ABNORMAL HIGH (ref 1.7–7.7)
Platelets: 77 10*3/uL — ABNORMAL LOW (ref 150–400)
RBC: 2.85 MIL/uL — AB (ref 3.87–5.11)
RDW: 20.1 % — ABNORMAL HIGH (ref 11.5–15.5)
WBC: 15 10*3/uL — ABNORMAL HIGH (ref 4.0–10.5)

## 2014-04-24 LAB — I-STAT CG4 LACTIC ACID, ED
LACTIC ACID, VENOUS: 1.46 mmol/L (ref 0.5–2.2)
Lactic Acid, Venous: 1.01 mmol/L (ref 0.5–2.2)

## 2014-04-24 LAB — PROTIME-INR
INR: 1.21 (ref 0.00–1.49)
Prothrombin Time: 15.4 seconds — ABNORMAL HIGH (ref 11.6–15.2)

## 2014-04-24 LAB — APTT: aPTT: 39 seconds — ABNORMAL HIGH (ref 24–37)

## 2014-04-24 LAB — URINALYSIS, ROUTINE W REFLEX MICROSCOPIC
BILIRUBIN URINE: NEGATIVE
GLUCOSE, UA: NEGATIVE mg/dL
HGB URINE DIPSTICK: NEGATIVE
KETONES UR: NEGATIVE mg/dL
Leukocytes, UA: NEGATIVE
Nitrite: NEGATIVE
PH: 6 (ref 5.0–8.0)
Protein, ur: NEGATIVE mg/dL
Specific Gravity, Urine: 1.013 (ref 1.005–1.030)
Urobilinogen, UA: 1 mg/dL (ref 0.0–1.0)

## 2014-04-24 LAB — TROPONIN I

## 2014-04-24 LAB — AMMONIA: Ammonia: 19 umol/L (ref 11–60)

## 2014-04-24 MED ORDER — PANTOPRAZOLE SODIUM 40 MG PO TBEC
40.0000 mg | DELAYED_RELEASE_TABLET | Freq: Every day | ORAL | Status: DC
  Administered 2014-04-24 – 2014-05-02 (×9): 40 mg via ORAL
  Filled 2014-04-24 (×10): qty 1

## 2014-04-24 MED ORDER — LEVOFLOXACIN IN D5W 750 MG/150ML IV SOLN: 750.0000 mg | INTRAVENOUS | Status: DC

## 2014-04-24 MED ORDER — LEVALBUTEROL HCL 0.63 MG/3ML IN NEBU
0.6300 mg | INHALATION_SOLUTION | RESPIRATORY_TRACT | Status: DC | PRN
  Administered 2014-04-27 – 2014-04-29 (×4): 0.63 mg via RESPIRATORY_TRACT
  Filled 2014-04-24 (×5): qty 3

## 2014-04-24 MED ORDER — POTASSIUM CHLORIDE CRYS ER 20 MEQ PO TBCR
40.0000 meq | EXTENDED_RELEASE_TABLET | Freq: Once | ORAL | Status: AC
  Administered 2014-04-24: 40 meq via ORAL
  Filled 2014-04-24: qty 2

## 2014-04-24 MED ORDER — SODIUM CHLORIDE 0.9 % IV SOLN
INTRAVENOUS | Status: DC
Start: 2014-04-24 — End: 2014-05-03
  Administered 2014-04-24 – 2014-04-25 (×2): via INTRAVENOUS
  Administered 2014-04-26: 1000 mL via INTRAVENOUS
  Administered 2014-04-26 – 2014-05-03 (×7): via INTRAVENOUS

## 2014-04-24 MED ORDER — VANCOMYCIN HCL IN DEXTROSE 1-5 GM/200ML-% IV SOLN
1000.0000 mg | Freq: Once | INTRAVENOUS | Status: AC
  Administered 2014-04-24: 1000 mg via INTRAVENOUS
  Filled 2014-04-24: qty 200

## 2014-04-24 MED ORDER — SODIUM CHLORIDE 0.9 % IJ SOLN
3.0000 mL | Freq: Two times a day (BID) | INTRAMUSCULAR | Status: DC
  Administered 2014-04-24 – 2014-04-28 (×7): 3 mL via INTRAVENOUS

## 2014-04-24 MED ORDER — AMLODIPINE BESYLATE 2.5 MG PO TABS
2.5000 mg | ORAL_TABLET | Freq: Every day | ORAL | Status: DC
  Administered 2014-04-25 – 2014-05-02 (×8): 2.5 mg via ORAL
  Filled 2014-04-24 (×8): qty 1

## 2014-04-24 MED ORDER — METHYLPREDNISOLONE SODIUM SUCC 125 MG IJ SOLR
125.0000 mg | Freq: Once | INTRAMUSCULAR | Status: AC
  Administered 2014-04-24: 125 mg via INTRAVENOUS
  Filled 2014-04-24: qty 2

## 2014-04-24 MED ORDER — LEVOFLOXACIN IN D5W 750 MG/150ML IV SOLN
750.0000 mg | Freq: Once | INTRAVENOUS | Status: AC
  Administered 2014-04-24: 750 mg via INTRAVENOUS
  Filled 2014-04-24: qty 150

## 2014-04-24 MED ORDER — VANCOMYCIN HCL 500 MG IV SOLR
500.0000 mg | Freq: Two times a day (BID) | INTRAVENOUS | Status: DC
  Administered 2014-04-25 – 2014-04-26 (×4): 500 mg via INTRAVENOUS
  Filled 2014-04-24 (×4): qty 500

## 2014-04-24 MED ORDER — SODIUM CHLORIDE 0.9 % IV BOLUS (SEPSIS)
1000.0000 mL | INTRAVENOUS | Status: AC
  Administered 2014-04-24 (×2): 1000 mL via INTRAVENOUS

## 2014-04-24 MED ORDER — METRONIDAZOLE IN NACL 5-0.79 MG/ML-% IV SOLN
500.0000 mg | Freq: Once | INTRAVENOUS | Status: AC
  Administered 2014-04-24: 500 mg via INTRAVENOUS
  Filled 2014-04-24: qty 100

## 2014-04-24 MED ORDER — DEXTROSE 5 % IV SOLN
2.0000 g | Freq: Once | INTRAVENOUS | Status: AC
  Administered 2014-04-24: 2 g via INTRAVENOUS
  Filled 2014-04-24: qty 2

## 2014-04-24 MED ORDER — LORAZEPAM 0.5 MG PO TABS
0.5000 mg | ORAL_TABLET | Freq: Three times a day (TID) | ORAL | Status: DC
  Administered 2014-04-24 – 2014-05-02 (×24): 0.5 mg via ORAL
  Filled 2014-04-24 (×25): qty 1

## 2014-04-24 MED ORDER — ONDANSETRON HCL 4 MG PO TABS
8.0000 mg | ORAL_TABLET | Freq: Two times a day (BID) | ORAL | Status: DC
Start: 2014-04-24 — End: 2014-04-24

## 2014-04-24 MED ORDER — VITAMINS A & D EX OINT
TOPICAL_OINTMENT | CUTANEOUS | Status: AC
  Administered 2014-04-24: 22:00:00
  Filled 2014-04-24: qty 5

## 2014-04-24 MED ORDER — OXYCODONE-ACETAMINOPHEN 5-325 MG PO TABS
1.0000 | ORAL_TABLET | ORAL | Status: DC | PRN
  Administered 2014-04-24: 1 via ORAL
  Administered 2014-04-26: 2 via ORAL
  Administered 2014-04-26: 1 via ORAL
  Filled 2014-04-24: qty 2
  Filled 2014-04-24 (×2): qty 1

## 2014-04-24 MED ORDER — CYCLOBENZAPRINE HCL 10 MG PO TABS
5.0000 mg | ORAL_TABLET | Freq: Three times a day (TID) | ORAL | Status: DC | PRN
  Administered 2014-04-24 – 2014-05-01 (×6): 5 mg via ORAL
  Filled 2014-04-24 (×6): qty 1

## 2014-04-24 MED ORDER — DEXTROSE 5 % IV SOLN
2.0000 g | Freq: Three times a day (TID) | INTRAVENOUS | Status: DC
  Administered 2014-04-25 – 2014-04-27 (×8): 2 g via INTRAVENOUS
  Filled 2014-04-24 (×9): qty 2

## 2014-04-24 MED ORDER — METRONIDAZOLE IN NACL 5-0.79 MG/ML-% IV SOLN
500.0000 mg | Freq: Three times a day (TID) | INTRAVENOUS | Status: DC
  Administered 2014-04-25 – 2014-04-27 (×8): 500 mg via INTRAVENOUS
  Filled 2014-04-24 (×8): qty 100

## 2014-04-24 MED ORDER — BUTALBITAL-APAP-CAFFEINE 50-325-40 MG PO TABS: 1.0000 | ORAL_TABLET | Freq: Four times a day (QID) | ORAL | Status: DC | PRN

## 2014-04-24 MED ORDER — BUDESONIDE 0.5 MG/2ML IN SUSP
0.5000 mg | Freq: Two times a day (BID) | RESPIRATORY_TRACT | Status: DC
  Administered 2014-04-24 – 2014-05-03 (×18): 0.5 mg via RESPIRATORY_TRACT
  Filled 2014-04-24 (×17): qty 2

## 2014-04-24 MED ORDER — ENOXAPARIN SODIUM 80 MG/0.8ML ~~LOC~~ SOLN
80.0000 mg | SUBCUTANEOUS | Status: DC
  Administered 2014-04-24 – 2014-04-27 (×4): 80 mg via SUBCUTANEOUS
  Filled 2014-04-24 (×6): qty 0.8

## 2014-04-24 NOTE — Progress Notes (Signed)
ANTIBIOTIC CONSULT NOTE - INITIAL  Pharmacy Consult for aztreonam, vancomycin, metronidazole Indication: sepsis  Allergies  Allergen Reactions  . Penicillins Other (See Comments)    Reaction a long time ago    Patient Measurements: Height: 4\' 11"  (149.9 cm) Weight: 120 lb 2.4 oz (54.5 kg) IBW/kg (Calculated) : 43.2  Vital Signs: Temp: 97.7 F (36.5 C) (11/09 2053) Temp Source: Oral (11/09 2053) BP: 135/70 mmHg (11/09 2053) Pulse Rate: 100 (11/09 2053) Intake/Output from previous day:   Intake/Output from this shift:    Labs:  Recent Labs  04/24/14 1653  WBC 15.0*  HGB 8.5*  PLT 77*  CREATININE 0.69   Estimated Creatinine Clearance: 57 mL/min (by C-G formula based on Cr of 0.69). No results for input(s): VANCOTROUGH, VANCOPEAK, VANCORANDOM, GENTTROUGH, GENTPEAK, GENTRANDOM, TOBRATROUGH, TOBRAPEAK, TOBRARND, AMIKACINPEAK, AMIKACINTROU, AMIKACIN in the last 72 hours.   Microbiology: Recent Results (from the past 720 hour(s))  TECHNOLOGIST REVIEW     Status: None   Collection Time: 03/27/14 10:20 AM  Result Value Ref Range Status   Technologist Review Oc meta and myelocyte  Final  Urine culture     Status: None   Collection Time: 04/03/14  9:44 PM  Result Value Ref Range Status   Specimen Description URINE, CLEAN CATCH  Final   Special Requests NONE  Final   Culture  Setup Time   Final    04/04/2014 01:52 Performed at [place] Performed at [facility]  Final   Culture NO GROWTH Performed at [facility]  Final   Report Status 04/04/2014 FINAL  Final  TECHNOLOGIST REVIEW     Status: None   Collection Time: 04/10/14  2:27 PM  Result Value Ref Range Status   Technologist Review 2% Nrbcs, 3% metamyelocytes  Final  Blood Culture (routine x 2)     Status: None   Collection Time: 04/10/14  3:20 PM  Result Value Ref Range Status   Specimen Description BLOOD LEFT CHEST  Final   Special Requests BOTTLES DRAWN  AEROBIC AND ANAEROBIC 5 CC EA  Final   Culture  Setup Time   Final    04/10/2014 21:17 Performed at [facility]    Culture   Final    NO GROWTH 5 DAYS Performed at [facility]    Report Status 04/16/2014 FINAL  Final  Blood Culture (routine x 2)     Status: None   Collection Time: 04/10/14  3:25 PM  Result Value Ref Range Status   Specimen Description BLOOD LEFT ARM  Final   Special Requests BOTTLES DRAWN AEROBIC AND ANAEROBIC 8CC  Final   Culture  Setup Time   Final    04/11/2014 03:55 Performed at [place]   Final    NO GROWTH 5 DAYS Performed at [facility]    Report Status 04/17/2014 FINAL  Final  Urine culture     Status: None   Collection Time: 04/10/14  3:37 PM  Result Value Ref Range Status   Specimen Description URINE, CATHETERIZED  Final   Special Requests NONE  Final   Culture  Setup Time   Final    04/10/2014 21:31 Performed at [place] Performed at [facility]  Final   Culture NO GROWTH Performed at [facility]  Final   Report Status 04/11/2014 FINAL  Final  MRSA PCR Screening  Status: None   Collection Time: 04/10/14 10:20 PM  Result Value Ref Range Status   MRSA by PCR NEGATIVE NEGATIVE Final    Comment:        The GeneXpert MRSA Assay (FDA approved for NASAL specimens only), is one component of a comprehensive MRSA colonization surveillance program. It is not intended to diagnose MRSA infection nor to guide or monitor treatment for MRSA infections.  Culture, blood (routine x 2)     Status: None   Collection Time: 04/11/14  5:50 PM  Result Value Ref Range Status   Specimen Description BLOOD LEFT ARM  Final   Special Requests BOTTLES DRAWN AEROBIC AND ANAEROBIC  10 CC  Final   Culture  Setup Time   Final    04/11/2014 22:33 Performed at [facility]    Culture   Final    NO GROWTH 5 DAYS Performed at [facility]     Report Status 04/17/2014 FINAL  Final  Culture, blood (routine x 2)     Status: None   Collection Time: 04/11/14  5:59 PM  Result Value Ref Range Status   Specimen Description BLOOD LEFT ARM  Final   Special Requests BOTTLES DRAWN AEROBIC AND ANAEROBIC  10 CC  Final   Culture  Setup Time   Final    04/11/2014 22:33 Performed at [facility]    Culture   Final    NO GROWTH 5 DAYS Note: Culture results may be compromised due to an excessive volume of blood received in culture bottles. Performed at [facility]    Report Status 04/17/2014 FINAL  Final    Medical History: Past Medical History  Diagnosis Date  . Acid reflux   . Asthma   . COPD (chronic obstructive pulmonary disease)   . Cancer     lumpectomy, radiation 2009  . Seasonal allergies   . Alcohol abuse   . Elevated d-dimer     negative chest CT  . Allergy   . Emphysema of lung   . Hot flashes   . Reading disorder   . Impaired writing skills   . Wears glasses   . Wears dentures     top  . HOH (hard of hearing)     left ear  . S/P radiation therapy 01/13/14    SRS left parietal 20Gy    Medications:  Anti-infectives    Start     Dose/Rate Route Frequency Ordered Stop   04/25/14 1800  levofloxacin (LEVAQUIN) IVPB 750 mg     750 mg100 mL/hr over 90 Minutes Intravenous Every 24 hours 04/24/14 1859     04/25/14 0600  vancomycin (VANCOCIN) 500 mg in sodium chloride 0.9 % 100 mL IVPB     500 mg100 mL/hr over 60 Minutes Intravenous Every 12 hours 04/24/14 1859     04/25/14 0000  aztreonam (AZACTAM) 2 g in dextrose 5 % 50 mL IVPB     2 g100 mL/hr over 30 Minutes Intravenous Every 8 hours 04/24/14 1859     04/24/14 2130  metroNIDAZOLE (FLAGYL) IVPB 500 mg     500 mg100 mL/hr over 60 Minutes Intravenous  Once 04/24/14 2116     04/24/14 1645  levofloxacin (LEVAQUIN) IVPB 750 mg     750 mg100 mL/hr over 90 Minutes Intravenous  Once 04/24/14 1644 04/24/14 1953   04/24/14 1645  aztreonam (AZACTAM) 2 g in  dextrose 5 % 50 mL IVPB     2 g100 mL/hr over  30 Minutes Intravenous  Once 04/24/14 1644 04/24/14 1816   04/24/14 1645  vancomycin (VANCOCIN) IVPB 1000 mg/200 mL premix     1,000 mg200 mL/hr over 60 Minutes Intravenous  Once 04/24/14 1644 04/24/14 1853     Assessment: 45 yoF presented to [facility] ED on 11/9 with fever and tachycardia. PMH includes metastatic breast cancer (last chemo given 10/5), COPD and HTN. She was recently admitted (10/26-11/4) with sepsis and pneumonia; she completed 7 days of vanc/cefepime then transitioned to oral Levaquin. Pharmacy was initially consulted to dose vancomycin, levaquin, and aztreonam for suspected sepsis.  Levaquin was d/c and pharmacy is consulted to dose metronidazole for possible intra-abdominal/Cdiff infection.  11/9 >> Vanc >> 11/9 >> aztreonam >>  11/9 >> levaquin >> 11/9 11/9 >> metronidazole >>  Tmax: 104.2 WBCs: 15 (prednisone PTA) Renal: SCr 0.69, CrCl ~ 56 ml/min Lactic acid: 1.46   Goal of Therapy:  Vancomycin trough level 15-20 mcg/ml  Appropriate abx dosing, eradication of infection.   Plan:   Metronidazole 500mg  IV q8h  Aztreonam 2g IV q8h  Vancomycin 1g IV once, then 500mg  IV q12h.  Measure Vanc trough at steady state.  Follow up renal fxn and culture results.  Gretta Arab PharmD, BCPS Pager [phone] 04/24/2014 9:35 PM

## 2014-04-24 NOTE — Progress Notes (Signed)
ANTIBIOTIC CONSULT NOTE - INITIAL  Pharmacy Consult for Levaquin, aztreonam, vancomycin Indication: sepsis  Allergies  Allergen Reactions  . Penicillins Other (See Comments)    Reaction a long time ago    Patient Measurements: Weight: 115 lb (52.164 kg)  Vital Signs: Temp: 104.2 F (40.1 C) (11/09 1639) Temp Source: Rectal (11/09 1639) BP: 151/73 mmHg (11/09 1637) Pulse Rate: 116 (11/09 1637) Intake/Output from previous day:   Intake/Output from this shift:    Labs:  Recent Labs  04/24/14 1653  WBC 15.0*  HGB 8.5*  PLT PENDING  CREATININE 0.69   Estimated Creatinine Clearance: 55.9 mL/min (by C-G formula based on Cr of 0.69). No results for input(s): VANCOTROUGH, VANCOPEAK, VANCORANDOM, GENTTROUGH, GENTPEAK, GENTRANDOM, TOBRATROUGH, TOBRAPEAK, TOBRARND, AMIKACINPEAK, AMIKACINTROU, AMIKACIN in the last 72 hours.   Microbiology: Recent Results (from the past 720 hour(s))  TECHNOLOGIST REVIEW     Status: None   Collection Time: 03/27/14 10:20 AM  Result Value Ref Range Status   Technologist Review Oc meta and myelocyte  Final  Urine culture     Status: None   Collection Time: 04/03/14  9:44 PM  Result Value Ref Range Status   Specimen Description URINE, CLEAN CATCH  Final   Special Requests NONE  Final   Culture  Setup Time   Final    04/04/2014 01:52 Performed at [place] Performed at [facility]  Final   Culture NO GROWTH Performed at [facility]  Final   Report Status 04/04/2014 FINAL  Final  TECHNOLOGIST REVIEW     Status: None   Collection Time: 04/10/14  2:27 PM  Result Value Ref Range Status   Technologist Review 2% Nrbcs, 3% metamyelocytes  Final  Blood Culture (routine x 2)     Status: None   Collection Time: 04/10/14  3:20 PM  Result Value Ref Range Status   Specimen Description BLOOD LEFT CHEST  Final   Special Requests BOTTLES DRAWN AEROBIC AND ANAEROBIC 5 CC EA  Final   Culture  Setup  Time   Final    04/10/2014 21:17 Performed at [facility]    Culture   Final    NO GROWTH 5 DAYS Performed at [facility]    Report Status 04/16/2014 FINAL  Final  Blood Culture (routine x 2)     Status: None   Collection Time: 04/10/14  3:25 PM  Result Value Ref Range Status   Specimen Description BLOOD LEFT ARM  Final   Special Requests BOTTLES DRAWN AEROBIC AND ANAEROBIC 8CC  Final   Culture  Setup Time   Final    04/11/2014 03:55 Performed at [place]   Final    NO GROWTH 5 DAYS Performed at [facility]    Report Status 04/17/2014 FINAL  Final  Urine culture     Status: None   Collection Time: 04/10/14  3:37 PM  Result Value Ref Range Status   Specimen Description URINE, CATHETERIZED  Final   Special Requests NONE  Final   Culture  Setup Time   Final    04/10/2014 21:31 Performed at [place] Performed at [facility]  Final   Culture NO GROWTH Performed at [facility]  Final   Report Status 04/11/2014 FINAL  Final  MRSA PCR Screening     Status: None   Collection Time: 04/10/14 10:20 PM  Result Value Ref Range Status   MRSA by PCR NEGATIVE NEGATIVE Final    Comment:        The GeneXpert MRSA Assay (FDA approved for NASAL specimens only), is one component of a comprehensive MRSA colonization surveillance program. It is not intended to diagnose MRSA infection nor to guide or monitor treatment for MRSA infections.  Culture, blood (routine x 2)     Status: None   Collection Time: 04/11/14  5:50 PM  Result Value Ref Range Status   Specimen Description BLOOD LEFT ARM  Final   Special Requests BOTTLES DRAWN AEROBIC AND ANAEROBIC  10 CC  Final   Culture  Setup Time   Final    04/11/2014 22:33 Performed at [facility]    Culture   Final    NO GROWTH 5 DAYS Performed at [facility]    Report Status 04/17/2014 FINAL  Final  Culture, blood  (routine x 2)     Status: None   Collection Time: 04/11/14  5:59 PM  Result Value Ref Range Status   Specimen Description BLOOD LEFT ARM  Final   Special Requests BOTTLES DRAWN AEROBIC AND ANAEROBIC  10 CC  Final   Culture  Setup Time   Final    04/11/2014 22:33 Performed at [facility]    Culture   Final    NO GROWTH 5 DAYS Note: Culture results may be compromised due to an excessive volume of blood received in culture bottles. Performed at [facility]    Report Status 04/17/2014 FINAL  Final    Medical History: Past Medical History  Diagnosis Date  . Acid reflux   . Asthma   . COPD (chronic obstructive pulmonary disease)   . Cancer     lumpectomy, radiation 2009  . Seasonal allergies   . Alcohol abuse   . Elevated d-dimer     negative chest CT  . Allergy   . Emphysema of lung   . Hot flashes   . Reading disorder   . Impaired writing skills   . Wears glasses   . Wears dentures     top  . HOH (hard of hearing)     left ear  . S/P radiation therapy 01/13/14    SRS left parietal 20Gy    Medications:  Anti-infectives    Start     Dose/Rate Route Frequency Ordered Stop   04/24/14 1645  levofloxacin (LEVAQUIN) IVPB 750 mg     750 mg100 mL/hr over 90 Minutes Intravenous  Once 04/24/14 1644     04/24/14 1645  aztreonam (AZACTAM) 2 g in dextrose 5 % 50 mL IVPB     2 g100 mL/hr over 30 Minutes Intravenous  Once 04/24/14 1644     04/24/14 1645  vancomycin (VANCOCIN) IVPB 1000 mg/200 mL premix     1,000 mg200 mL/hr over 60 Minutes Intravenous  Once 04/24/14 1644       Assessment: [name] presented to [facility] ED on 11/9 with fever and tachycardia. PMH includes metastatic breast cancer (last chemo given 10/5), COPD and HTN. She was recently admitted (10/26-11/4) with sepsis and pneumonia; she completed 7 days of vanc/cefepime then transitioned to oral Levaquin. Pharmacy is consulted to dose vancomycin, levaquin, and aztreonam for suspected sepsis.  11/9 >>  Vanc >> 11/9 >> aztreonam >>  11/9 >> levaquin >>  Tmax: 104.2 WBCs: 15 (prednisone PTA) Renal: SCr 0.69, CrCl ~ 56 ml/min Lactic acid: 1.46   Goal of  Therapy:  Vancomycin trough level 15-20 mcg/ml  Appropriate abx dosing, eradication of infection.   Plan:   Levaquin 750 mg IV q24h  Aztreonam 2g IV q8h  Vancomycin 1g IV once, then 500mg  IV q12h.  Measure Vanc trough at steady state.  Follow up renal fxn and culture results.  Gretta Arab PharmD, BCPS Pager [phone] 04/24/2014 5:16 PM

## 2014-04-24 NOTE — ED Provider Notes (Signed)
  Face-to-face evaluation   History: Fever started today. Ongoing right low back pain. Recent chemo, 03/27/14.  Physical exam: Frail. She is tachypneic and tachycardic.  Lungs have shallow air movement but no audible wheezes or rhonchi.  Abdomen is soft, with mild guarding, diffusely, but no palpable tenderness.  She is mentating normally.  She moves her neck easily.  Medical screening examination/treatment/procedure(s) were conducted as a shared visit with non-physician practitioner(s) and myself.  I personally evaluated the patient during the encounter   Richarda Blade, MD 04/25/14 1022

## 2014-04-24 NOTE — Progress Notes (Signed)
  CARE MANAGEMENT ED NOTE 04/24/2014  Patient:  [name], [name]   Account Number:  [id number]  Date Initiated:  04/24/2014  Documentation initiated by:  Livia Snellen  Subjective/Objective Assessment:   Patient presents to Ed with increased confusion, fever tachycardia.     Subjective/Objective Assessment Detail:   Temp 104.2 rectal.  WBC 15.0  heart rate 112.  Patient diagnosed with sepsis, admit to stepdown.  Patient with pmhx of COPD and breast cancer.     Action/Plan:   Action/Plan Detail:   Anticipated DC Date:       Status Recommendation to Physician:   Result of Recommendation:    Other ED Services  Consult [facility] [place]  CM consult  Other    Choice offered to / List presented to:       DME agency  [place].        Status of service:  Completed, signed off  ED Comments:   ED Comments Detail:  EDCM spoke to patient and her family at bedside, patient's daughter and brother at bedside.  EDCM spoke to them briefly as patient was being moved to unit.  Patient lives at home by herself.  Patient's pcp is Dr. Vernon Prey at [facility]. Patient wears oxygen 2 liters at home supplied by [facility]. Patient does not have home health services at htis time. Patient with Medicaid insurnace.  Patient's family interested in CAP program.  EDCM provided patient's family with phone number to DSS to inquire about CAP program. Patient's grand daughter would like to be patient's private aide at home.  EDCM inofrmed patient's family that it may be a long wait time before patient is initiated into the CAP program.  EDCM provided patient's family with list of private duty nursing agencies as well.  Patient's family thankful for resources.  No further EDCM needs at this time.

## 2014-04-24 NOTE — ED Notes (Addendum)
Per EMS- Breast cancer pt here with c/o of fever and tachycardia. Pain to lower back. Temp 103. Tylenol given 1000mg  at 3:50pm.

## 2014-04-24 NOTE — H&P (Signed)
[facility] History and Physical  [name] ZJI:[id number] DOB: 03/01/1956 DOA: 04/24/2014  Referring physician: Loma Sousa PA [place] PCP: Lorayne Marek, MD   Chief Complaint: fever and feeling poorly  HPI: [name] is a 58 y.o. female  Felt well since discharge from hospital on 04/19/14. Family member called pt around 15:00 who acted slightly confused. Fever at home to 101. 2L home O2 requirement. Last Chemo 03/27/14. Diarrhea started today as well. 3-4 nonbloody loose bouts today. intermitten RUQ pain, which has been present for months and was associated w/ her last bout of pneumonia.   Stopped prednisone dose pack on 04/23/14   Review of Systems:  Constitutional:  Fevers.  HEENT:  No headaches, Difficulty swallowing,Sore throat,  Cardio-vascular:  No chest pain, Orthopnea, PND, swelling in lower extremities, anasarca, dizziness, palpitations  GI:  No heartburn, indigestion, abdominal pain, nausea, vomiting, diarrhea, change in bowel habits, loss of appetite  Resp:  No shortness of breath with exertion or at rest. No excess mucus, no productive cough, No non-productive cough, No coughing up of blood.No change in color of mucus.No wheezing.No chest wall deformity  Skin:  no rash or lesions.  GU:  no dysuria, change in color of urine, no urgency or frequency. No flank pain.  Musculoskeletal:  No joint pain or swelling. No decreased range of motion. No back pain.  Psych:  No change in mood or affect. No depression or anxiety. No memory loss.   Past Medical History  Diagnosis Date  . Acid reflux   . Asthma   . COPD (chronic obstructive pulmonary disease)   . Cancer     lumpectomy, radiation 2009  . Seasonal allergies   . Alcohol abuse   . Elevated d-dimer     negative chest CT  . Allergy   . Emphysema of lung   . Hot flashes   . Reading disorder   . Impaired writing skills   . Wears glasses   . Wears dentures     top  . HOH (hard of hearing)     left  ear  . S/P radiation therapy 01/13/14    SRS left parietal 20Gy   Past Surgical History  Procedure Laterality Date  . Breast lumpectomy    . Abdominal hysterectomy    . Portacath placement Left 12/30/2013    Procedure: INSERTION PORT-A-CATH;  Surgeon: Merrie Roof, MD;  Location: [place];  Service: General;  Laterality: Left;  subclavian area   Social History:  reports that she quit smoking about 4 weeks ago. Her smoking use included Cigarettes. She has a 30 pack-year smoking history. She has never used smokeless tobacco. She reports that she drinks alcohol. She reports that she does not use illicit drugs.  Allergies  Allergen Reactions  . Penicillins Other (See Comments)    Reaction a long time ago    Family History  Problem Relation Age of Onset  . Hypertension Other      Prior to Admission medications   Medication Sig Start Date End Date Taking? Authorizing Provider  arformoterol (BROVANA) 15 MCG/2ML NEBU Take 2 mLs (15 mcg total) by nebulization 2 (two) times daily. 04/18/14  Yes Belkys A Regalado, MD  bisacodyl (DULCOLAX) 10 MG suppository Place 1 suppository (10 mg total) rectally daily as needed for moderate constipation. 04/18/14  Yes Belkys A Regalado, MD  butalbital-acetaminophen-caffeine (FIORICET) 50-325-40 MG per tablet Take 1-2 tablets by mouth every 6 (six) hours as needed for headache. 01/29/14 01/29/15 Yes  Kristen N Ward, DO  carbamide peroxide (DEBROX) 6.5 % otic solution Place 5 drops into both ears 2 (two) times daily as needed.   Yes Historical Provider, MD  cholecalciferol (VITAMIN D) 1000 UNITS tablet Take 2,000 Units by mouth daily.   Yes Historical Provider, MD  cyclobenzaprine (FLEXERIL) 5 MG tablet Take 1 tablet (5 mg total) by mouth 3 (three) times daily as needed for muscle spasms. 03/27/14  Yes Marcelino Duster, NP  enoxaparin (LOVENOX) 80 MG/0.8ML injection Inject 0.8 mLs (80 mg total) into the skin daily. 04/19/14  Yes Belkys A Regalado,  MD  fluticasone (FLOVENT HFA) 220 MCG/ACT inhaler Inhale 1 puff into the lungs 2 (two) times daily. Patient taking differently: Inhale 1 puff into the lungs 2 (two) times daily as needed (SOB Asthma).  02/27/14  Yes Lorayne Marek, MD  hydrocortisone (ANUSOL-HC) 25 MG suppository Place 25 mg rectally 2 (two) times daily as needed. 01/23/14  Yes Marcelino Duster, NP  lidocaine-prilocaine (EMLA) cream Apply 1 application topically as needed. Apply over port area 1-2 hours before chemo, cover with plastic wrap 01/14/14  Yes Chauncey Cruel, MD  ondansetron (ZOFRAN) 8 MG tablet Take 1 tablet (8 mg total) by mouth 2 (two) times daily. Start the day after chemo for 2 days. Then take as needed for nausea or vomiting. 01/16/14  Yes Chauncey Cruel, MD  oxyCODONE-acetaminophen (ROXICET) 5-325 MG per tablet Take 1-2 tablets by mouth every 4 (four) hours as needed. 04/18/14  Yes Belkys A Regalado, MD  pantoprazole (PROTONIX) 40 MG tablet Take 1 tablet (40 mg total) by mouth daily. 01/23/14  Yes Marcelino Duster, NP  polyethylene glycol (MIRALAX / GLYCOLAX) packet Take 17 g by mouth 2 (two) times daily. 04/18/14  Yes Belkys A Regalado, MD  prochlorperazine (COMPAZINE) 10 MG tablet Take 1 tablet (10 mg total) by mouth every 6 (six) hours as needed (Nausea or vomiting). 01/16/14  Yes Chauncey Cruel, MD  amLODipine (NORVASC) 2.5 MG tablet Take 1 tablet (2.5 mg total) by mouth daily. 02/27/14   Lorayne Marek, MD  budesonide (PULMICORT) 0.5 MG/2ML nebulizer solution Take 2 mLs (0.5 mg total) by nebulization every 12 (twelve) hours. 04/18/14   Belkys A Regalado, MD  levalbuterol (XOPENEX) 0.63 MG/3ML nebulizer solution Take 3 mLs (0.63 mg total) by nebulization every 3 (three) hours as needed for wheezing or shortness of breath. 04/18/14   Belkys A Regalado, MD  levofloxacin (LEVAQUIN) 750 MG tablet Take 1 tablet (750 mg total) by mouth daily. 04/18/14   Belkys A Regalado, MD  lidocaine (LIDODERM) 5 % Place 1 patch onto the  skin daily. Remove & Discard patch within 12 hours or as directed by MD 04/19/14   Elmarie Shiley, MD  LORazepam (ATIVAN) 0.5 MG tablet Take 1 tablet (0.5 mg total) by mouth every 8 (eight) hours. 03/27/14   Marcelino Duster, NP  predniSONE (DELTASONE) 20 MG tablet Take 2 tablet for 3 days then take 1 tablet for 3 days then half tablet for one day then stop. 04/18/14   Elmarie Shiley, MD   Physical Exam: Filed Vitals:   04/24/14 1817 04/24/14 1900 04/24/14 1930 04/24/14 1943  BP: 138/93 163/67 125/83 132/72  Pulse: 105 113  105  Temp: 99.2 F (37.3 C)   99.5 F (37.5 C)  TempSrc: Oral   Oral  Resp: 19 26 32 18  Weight:      SpO2: 100% 92%  99%    Wt Readings  from Last 3 Encounters:  04/24/14 52.164 kg (115 lb)  04/15/14 52.2 kg (115 lb 1.3 oz)  04/05/14 52.39 kg (115 lb 8 oz)    General: mild distress, elderly and frail Eyes: PERRL, normal lids, irises & conjunctiva ENT: grossly normal hearing, lips & tongue Neck: no LAD, masses or thyromegaly Cardiovascular: RRR, no m/r/g. No LE edema. Telemetry: SR, no arrhythmias  Respiratory:  Mild scattered wheezes, minimal crackles in bases. nml effort Abdomen: mild diffuse ttp, nondistended Skin:  no rash or induration seen on limited exam Musculoskeletal:  grossly normal tone BUE/BLE Psychiatric:  grossly normal mood and affect, speech fluent and appropriate Neurologic:  grossly non-focal.          Labs on Admission:  Basic Metabolic Panel:  Recent Labs Lab 04/24/14 1653  NA 138  K 3.4*  CL 99  CO2 22  GLUCOSE 68*  BUN 14  CREATININE 0.69  CALCIUM 8.7   Liver Function Tests:  Recent Labs Lab 04/24/14 1653  AST 49*  ALT 8  ALKPHOS 101  BILITOT 0.4  PROT 5.7*  ALBUMIN 2.1*   No results for input(s): LIPASE, AMYLASE in the last 168 hours. No results for input(s): AMMONIA in the last 168 hours. CBC:  Recent Labs Lab 04/18/14 0534 04/19/14 0355 04/24/14 1653  WBC 22.7* 19.0* 15.0*  NEUTROABS  --   --   12.1*  HGB 9.5* 9.3* 8.5*  HCT 29.4* 28.9* 26.0*  MCV 91.6 92.9 91.2  PLT 135* 114* 77*   Cardiac Enzymes: No results for input(s): CKTOTAL, CKMB, CKMBINDEX, TROPONINI in the last 168 hours.  BNP (last 3 results)  Recent Labs  04/10/14 1537  PROBNP 298.3*   CBG: No results for input(s): GLUCAP in the last 168 hours.  Radiological Exams on Admission: Dg Chest Port 1 View  04/24/2014   CLINICAL DATA:  Fever and tachycardia.  EXAM: PORTABLE CHEST - 1 VIEW  COMPARISON:  04/15/2014 as well as chest CT 04/11/2014  FINDINGS: Left subclavian Port-A-Cath is unchanged with tip in the region of the cavoatrial junction. Low lung volumes with mild prominence of the perihilar markings. Stable known right suprahilar mass measuring approximately 3 cm. Possible small amount of bilateral pleural fluid. Cardiomediastinal silhouette and remainder of the exam is unchanged.  IMPRESSION: Low lung volumes with findings suggesting mild vascular congestion. Continued small amount bilateral pleural fluid.  Stable known right suprahilar mass.   Electronically Signed   By: Marin Olp M.D.   On: 04/24/2014 17:33    EKG: Independently reviewed. No EKG performed. - ordered  Assessment/Plan Active Problems:   COPD (chronic obstructive pulmonary disease)   Breast cancer of upper-outer quadrant of right female breast   Sepsis   Hypertension   Leukocytosis   DVT (deep venous thrombosis)  Sepsis: source not clear. Recently recent admission from 10/26-11/4 for Sepsis, encephalopathy, and pneumonia/respiratory failure. On Levaquin. WBC trending down. Just finished prednisone for COPD exacerbation. Last Chemo 03/27/14. Concern for possible HCAP vs intraabdominal process - Cdiff?? Lactic acid nml.  - Admit to step down - CT abd/pelvis - Stool culture, Cdiff PCR - Vanc, Aztreonam, [place] - BCX x2 UCX - procalcitonin - NS 134ml/hr  Mild encephalopathy: Mild intermittent confusion today. Likely secondary to  infection/sepsis. This is a recurring issue for the pt w/ acute illnesses - treatment as above - Ammonia  Breast Cancer: Last Chemo 03/27/14.  - Oncology consult in am  DVT: L DVT in gastrocnemius vein. Compliant on lovenox at home -  continue lovenox - discuss further other anticoagulation options.   COPD: mild exacerbation . On 2L [place] at home - Continue home pulmicort and xopenex - one time solumedrol 125mg  - O2 [place]   Chronic RUQ pain and MSK pain: at baseline per pt -  continue percocet and flexeril  HTN: normotensive - continue Norvasc   Code Status: FULL DVT Prophylaxis: on Lovenox for DVT treatment Family Communication: brother and daughter present Disposition Plan: pending improvement  Time spent: >?70 min in direct pt care adn coordination  [place] [email or web address] Password [email or web address]

## 2014-04-24 NOTE — ED Notes (Signed)
Bed: XB41 Expected date:  Expected time:  Means of arrival:  Comments: EMS-fever, cancer patient

## 2014-04-24 NOTE — Progress Notes (Signed)
Pt does not wish to wear CPAP at this time. She states that she wears O2 at home at night but not a CPAP. RT explained the importance of the CPAP and she said that she is in a lot of pain and does not feel like she could tolerate. Pt understands to call if she changes her mind. RT will continue to monitor patient.

## 2014-04-24 NOTE — ED Provider Notes (Signed)
CSN: [id number]     Arrival date & time 04/24/14  1604 History   First MD Initiated Contact with Patient 04/24/14 1617     Chief Complaint  Patient presents with  . Fever  . Tachycardia   HPI  Patient is a 58 y.o. Female with breast cancer, COPD, and seasonal allergies presents to the ED with fever.  Patient states that she was doing well this morning and went to lay down and then tried to get to the bathroom and became short of breath and had an episode of diarrhea.  Patient states that she took miralax.  Per the patient's daughter she has 2L O2 prn for shortness of breath.    Past Medical History  Diagnosis Date  . Acid reflux   . Asthma   . COPD (chronic obstructive pulmonary disease)   . Cancer     lumpectomy, radiation 2009  . Seasonal allergies   . Alcohol abuse   . Elevated d-dimer     negative chest CT  . Allergy   . Emphysema of lung   . Hot flashes   . Reading disorder   . Impaired writing skills   . Wears glasses   . Wears dentures     top  . HOH (hard of hearing)     left ear  . S/P radiation therapy 01/13/14    SRS left parietal 20Gy   Past Surgical History  Procedure Laterality Date  . Breast lumpectomy    . Abdominal hysterectomy    . Portacath placement Left 12/30/2013    Procedure: INSERTION PORT-A-CATH;  Surgeon: Merrie Roof, MD;  Location: [facility];  Service: General;  Laterality: Left;  subclavian area   Family History  Problem Relation Age of Onset  . Hypertension Other    History  Substance Use Topics  . Smoking status: Former Smoker -- 1.00 packs/day for 30 years    Types: Cigarettes    Quit date: 03/24/2014  . Smokeless tobacco: Never Used     Comment: down to 1-2 cigarettes daily6  . Alcohol Use: Yes     Comment: occ beer   OB History    Gravida Para Term Preterm AB TAB SAB Ectopic Multiple Living   1 1 1       1      Review of Systems  Constitutional: Positive for fever and fatigue. Negative for chills,  diaphoresis, activity change and appetite change.  Respiratory: Positive for shortness of breath. Negative for cough, chest tightness and wheezing.   Cardiovascular: Positive for leg swelling. Negative for chest pain and palpitations.  Gastrointestinal: Positive for abdominal pain and diarrhea. Negative for nausea, vomiting, constipation, blood in stool and anal bleeding.  Genitourinary: Negative for dysuria, urgency, frequency, hematuria and difficulty urinating.  All other systems reviewed and are negative.     Allergies  Penicillins  Home Medications   Prior to Admission medications   Medication Sig Start Date End Date Taking? Authorizing Provider  arformoterol (BROVANA) 15 MCG/2ML NEBU Take 2 mLs (15 mcg total) by nebulization 2 (two) times daily. 04/18/14  Yes Belkys A Regalado, MD  bisacodyl (DULCOLAX) 10 MG suppository Place 1 suppository (10 mg total) rectally daily as needed for moderate constipation. 04/18/14  Yes Belkys A Regalado, MD  butalbital-acetaminophen-caffeine (FIORICET) 50-325-40 MG per tablet Take 1-2 tablets by mouth every 6 (six) hours as needed for headache. 01/29/14 01/29/15 Yes Kristen N Ward, DO  carbamide peroxide (DEBROX) 6.5 % otic solution Place  5 drops into both ears 2 (two) times daily as needed.   Yes Historical Provider, MD  cholecalciferol (VITAMIN D) 1000 UNITS tablet Take 2,000 Units by mouth daily.   Yes Historical Provider, MD  cyclobenzaprine (FLEXERIL) 5 MG tablet Take 1 tablet (5 mg total) by mouth 3 (three) times daily as needed for muscle spasms. 03/27/14  Yes Marcelino Duster, NP  enoxaparin (LOVENOX) 80 MG/0.8ML injection Inject 0.8 mLs (80 mg total) into the skin daily. 04/19/14  Yes Belkys A Regalado, MD  fluticasone (FLOVENT HFA) 220 MCG/ACT inhaler Inhale 1 puff into the lungs 2 (two) times daily. Patient taking differently: Inhale 1 puff into the lungs 2 (two) times daily as needed (SOB Asthma).  02/27/14  Yes Lorayne Marek, MD  hydrocortisone  (ANUSOL-HC) 25 MG suppository Place 25 mg rectally 2 (two) times daily as needed. 01/23/14  Yes Marcelino Duster, NP  lidocaine-prilocaine (EMLA) cream Apply 1 application topically as needed. Apply over port area 1-2 hours before chemo, cover with plastic wrap 01/14/14  Yes Chauncey Cruel, MD  ondansetron (ZOFRAN) 8 MG tablet Take 1 tablet (8 mg total) by mouth 2 (two) times daily. Start the day after chemo for 2 days. Then take as needed for nausea or vomiting. 01/16/14  Yes Chauncey Cruel, MD  oxyCODONE-acetaminophen (ROXICET) 5-325 MG per tablet Take 1-2 tablets by mouth every 4 (four) hours as needed. 04/18/14  Yes Belkys A Regalado, MD  pantoprazole (PROTONIX) 40 MG tablet Take 1 tablet (40 mg total) by mouth daily. 01/23/14  Yes Marcelino Duster, NP  polyethylene glycol (MIRALAX / GLYCOLAX) packet Take 17 g by mouth 2 (two) times daily. 04/18/14  Yes Belkys A Regalado, MD  prochlorperazine (COMPAZINE) 10 MG tablet Take 1 tablet (10 mg total) by mouth every 6 (six) hours as needed (Nausea or vomiting). 01/16/14  Yes Chauncey Cruel, MD  amLODipine (NORVASC) 2.5 MG tablet Take 1 tablet (2.5 mg total) by mouth daily. 02/27/14   Lorayne Marek, MD  budesonide (PULMICORT) 0.5 MG/2ML nebulizer solution Take 2 mLs (0.5 mg total) by nebulization every 12 (twelve) hours. 04/18/14   Belkys A Regalado, MD  levalbuterol (XOPENEX) 0.63 MG/3ML nebulizer solution Take 3 mLs (0.63 mg total) by nebulization every 3 (three) hours as needed for wheezing or shortness of breath. 04/18/14   Belkys A Regalado, MD  levofloxacin (LEVAQUIN) 750 MG tablet Take 1 tablet (750 mg total) by mouth daily. 04/18/14   Belkys A Regalado, MD  lidocaine (LIDODERM) 5 % Place 1 patch onto the skin daily. Remove & Discard patch within 12 hours or as directed by MD 04/19/14   Elmarie Shiley, MD  LORazepam (ATIVAN) 0.5 MG tablet Take 1 tablet (0.5 mg total) by mouth every 8 (eight) hours. 03/27/14   Marcelino Duster, NP  predniSONE  (DELTASONE) 20 MG tablet Take 2 tablet for 3 days then take 1 tablet for 3 days then half tablet for one day then stop. 04/18/14   Belkys A Regalado, MD   BP 132/72 mmHg  Pulse 105  Temp(Src) 99.5 F (37.5 C) (Oral)  Resp 18  Wt 115 lb (52.164 kg)  SpO2 99% Physical Exam  Constitutional: She is oriented to person, place, and time. She appears well-developed and well-nourished. No distress.  HENT:  Head: Normocephalic and atraumatic.  Mouth/Throat: Oropharynx is clear and moist. No oropharyngeal exudate.  Eyes: Conjunctivae and EOM are normal. Pupils are equal, round, and reactive to light. No scleral icterus.  Neck: Normal range of motion. Neck supple. No JVD present. No thyromegaly present.  Cardiovascular: Regular rhythm, normal heart sounds and intact distal pulses.  Tachycardia present.  Exam reveals no gallop and no friction rub.   No murmur heard. Pulmonary/Chest: Tachypnea noted. No respiratory distress. She has wheezes. She has no rales. She exhibits no tenderness.  Abdominal: Soft. Bowel sounds are normal. She exhibits no distension and no mass. There is no tenderness. There is no rebound and no guarding.  Lymphadenopathy:    She has no cervical adenopathy.  Neurological: She is alert and oriented to person, place, and time. No cranial nerve deficit. Coordination normal.  Skin: Skin is warm and dry. She is not diaphoretic.  Psychiatric: She has a normal mood and affect. Her behavior is normal. Judgment and thought content normal.  Nursing note and vitals reviewed.   ED Course  Procedures (including critical care time) Labs Review Labs Reviewed  CBC WITH DIFFERENTIAL - Abnormal; Notable for the following:    WBC 15.0 (*)    RBC 2.85 (*)    Hemoglobin 8.5 (*)    HCT 26.0 (*)    RDW 20.1 (*)    Platelets 77 (*)    Neutrophils Relative % 81 (*)    Neutro Abs 12.1 (*)    Basophils Absolute 0.2 (*)    All other components within normal limits  COMPREHENSIVE METABOLIC  PANEL - Abnormal; Notable for the following:    Potassium 3.4 (*)    Glucose, Bld 68 (*)    Total Protein 5.7 (*)    Albumin 2.1 (*)    AST 49 (*)    Anion gap 17 (*)    All other components within normal limits  CULTURE, BLOOD (ROUTINE X 2)  CULTURE, BLOOD (ROUTINE X 2)  URINE CULTURE  URINALYSIS, ROUTINE W REFLEX MICROSCOPIC  I-STAT CG4 LACTIC ACID, ED  I-STAT CG4 LACTIC ACID, ED    Imaging Review Dg Chest Port 1 View  04/24/2014   CLINICAL DATA:  Fever and tachycardia.  EXAM: PORTABLE CHEST - 1 VIEW  COMPARISON:  04/15/2014 as well as chest CT 04/11/2014  FINDINGS: Left subclavian Port-A-Cath is unchanged with tip in the region of the cavoatrial junction. Low lung volumes with mild prominence of the perihilar markings. Stable known right suprahilar mass measuring approximately 3 cm. Possible small amount of bilateral pleural fluid. Cardiomediastinal silhouette and remainder of the exam is unchanged.  IMPRESSION: Low lung volumes with findings suggesting mild vascular congestion. Continued small amount bilateral pleural fluid.  Stable known right suprahilar mass.   Electronically Signed   By: Marin Olp M.D.   On: 04/24/2014 17:33     EKG Interpretation None      MDM   Final diagnoses:  Sepsis, due to unspecified organism   Patient is a 58 y.o. Female who presents to the ED with fever.  Patient is alert and is toxic appearing.  CBC shows leukocytosis. UA is negative.  CXR is negative.  Lactic acid is normal.  CMP unremarkable.  Urine culture, blood culture pending.  CT abdomen and pelvis performed during last admission which was negative at that time.  Patient appears to have a fever of an unknown source at this time.  I spoke with Dr. Marily Memos from [facility] who will admit the patient to stepdown at this time.  Patient seen by and discussed with Dr. Eulis Foster who agrees with the above plan and workup.      Jamie Kato Forcucci, PA-C  04/24/14 2004  Richarda Blade, MD 04/24/14  [place], MD 04/25/14 [id number]

## 2014-04-24 NOTE — Progress Notes (Signed)
04/24/2014 A.Aradhya Shellenbarger RNCM 2200pm EDCM noted patient IS receiving home health services with [facility] for visiting RN only.

## 2014-04-25 ENCOUNTER — Inpatient Hospital Stay (HOSPITAL_COMMUNITY): Payer: Medicaid Other

## 2014-04-25 DIAGNOSIS — D696 Thrombocytopenia, unspecified: Secondary | ICD-10-CM

## 2014-04-25 DIAGNOSIS — D649 Anemia, unspecified: Secondary | ICD-10-CM

## 2014-04-25 DIAGNOSIS — J449 Chronic obstructive pulmonary disease, unspecified: Secondary | ICD-10-CM

## 2014-04-25 DIAGNOSIS — R197 Diarrhea, unspecified: Secondary | ICD-10-CM

## 2014-04-25 LAB — CBC
HCT: 25.2 % — ABNORMAL LOW (ref 36.0–46.0)
HCT: 25.3 % — ABNORMAL LOW (ref 36.0–46.0)
Hemoglobin: 8.4 g/dL — ABNORMAL LOW (ref 12.0–15.0)
Hemoglobin: 8.4 g/dL — ABNORMAL LOW (ref 12.0–15.0)
MCH: 30.1 pg (ref 26.0–34.0)
MCH: 30.1 pg (ref 26.0–34.0)
MCHC: 33.3 g/dL (ref 30.0–36.0)
MCHC: 33.2 g/dL (ref 30.0–36.0)
MCV: 90.3 fL (ref 78.0–100.0)
MCV: 90.7 fL (ref 78.0–100.0)
PLATELETS: 63 10*3/uL — AB (ref 150–400)
PLATELETS: 59 10*3/uL — AB (ref 150–400)
RBC: 2.79 MIL/uL — AB (ref 3.87–5.11)
RBC: 2.79 MIL/uL — AB (ref 3.87–5.11)
RDW: 20 % — ABNORMAL HIGH (ref 11.5–15.5)
RDW: 20 % — ABNORMAL HIGH (ref 11.5–15.5)
WBC: 15.9 10*3/uL — ABNORMAL HIGH (ref 4.0–10.5)
WBC: 15.7 10*3/uL — ABNORMAL HIGH (ref 4.0–10.5)

## 2014-04-25 LAB — CLOSTRIDIUM DIFFICILE BY PCR: CDIFFPCR: NEGATIVE

## 2014-04-25 LAB — COMPREHENSIVE METABOLIC PANEL
ALT: 8 U/L (ref 0–35)
ANION GAP: 15 (ref 5–15)
AST: 43 U/L — AB (ref 0–37)
Albumin: 2 g/dL — ABNORMAL LOW (ref 3.5–5.2)
Alkaline Phosphatase: 108 U/L (ref 39–117)
BUN: 11 mg/dL (ref 6–23)
CALCIUM: 9 mg/dL (ref 8.4–10.5)
CO2: 24 meq/L (ref 19–32)
Chloride: 103 mEq/L (ref 96–112)
Creatinine, Ser: 0.55 mg/dL (ref 0.50–1.10)
Glucose, Bld: 132 mg/dL — ABNORMAL HIGH (ref 70–99)
Potassium: 3.7 mEq/L (ref 3.7–5.3)
Sodium: 142 mEq/L (ref 137–147)
Total Bilirubin: 0.3 mg/dL (ref 0.3–1.2)
Total Protein: 5.9 g/dL — ABNORMAL LOW (ref 6.0–8.3)

## 2014-04-25 LAB — DIFFERENTIAL
BASOS ABS: 0 10*3/uL (ref 0.0–0.1)
Basophils Relative: 0 % (ref 0–1)
Eosinophils Absolute: 0 10*3/uL (ref 0.0–0.7)
Eosinophils Relative: 0 % (ref 0–5)
LYMPHS PCT: 10 % — AB (ref 12–46)
Lymphs Abs: 1.6 10*3/uL (ref 0.7–4.0)
MONO ABS: 0.9 10*3/uL (ref 0.1–1.0)
MONOS PCT: 6 % (ref 3–12)
NEUTROS ABS: 13.2 10*3/uL — AB (ref 1.7–7.7)
NRBC: 13 /100{WBCs} — AB
Neutrophils Relative %: 84 % — ABNORMAL HIGH (ref 43–77)

## 2014-04-25 LAB — FIBRINOGEN: FIBRINOGEN: 615 mg/dL — AB (ref 204–475)

## 2014-04-25 LAB — PROTIME-INR
INR: 1.26 (ref 0.00–1.49)
PROTHROMBIN TIME: 15.9 s — AB (ref 11.6–15.2)

## 2014-04-25 MED ORDER — LIDOCAINE 5 % EX PTCH
1.0000 | MEDICATED_PATCH | CUTANEOUS | Status: DC
  Administered 2014-04-25 – 2014-05-03 (×9): 1 via TRANSDERMAL
  Filled 2014-04-25 (×10): qty 1

## 2014-04-25 MED ORDER — IOHEXOL 300 MG/ML  SOLN
25.0000 mL | INTRAMUSCULAR | Status: AC
  Administered 2014-04-25 (×2): 25 mL via ORAL

## 2014-04-25 MED ORDER — IOHEXOL 300 MG/ML  SOLN
80.0000 mL | Freq: Once | INTRAMUSCULAR | Status: AC | PRN
  Administered 2014-04-25: 80 mL via INTRAVENOUS

## 2014-04-25 NOTE — Care Management Note (Addendum)
  Page 2 of 2   04/27/2014     2:10:02 PM CARE MANAGEMENT NOTE 04/27/2014  Patient:  [name], [name]   Account Number:  [id number]  Date Initiated:  04/25/2014  Documentation initiated by:  DAVIS,RHONDA  Subjective/Objective Assessment:   pt readmitted due to hcap and failed out patient treatment     Action/Plan:   SNF   Anticipated DC Date:  04/28/2014   Anticipated DC Plan:  [place]  In-house referral  NA      DC Planning Services  CM consult  [facility]      [facility] Choice  NA   Choice offered to / List presented to:  NA   DME arranged  NA      DME agency  NA     [place] arranged  NA      [place] agency  NA   Status of service:  Completed, signed off Medicare Important Message given?   (If response is "NO", the following Medicare IM given date fields will be blank) Date Medicare IM given:   Medicare IM given by:   Date Additional Medicare IM given:   Additional Medicare IM given by:    Discharge Disposition:  [place]  Per UR Regulation:  Reviewed for med. necessity/level of care/duration of stay  If discussed at [place] of Stay Meetings, dates discussed:    Comments:  04/27/14 10:30 CM spoke with pt in room with CSW, [name]. Pt is  agreeable to going to SNF for rehab after hospitalization and  states she feels it would be a good idea to speak with her daughter, [name] for discharge needs. CM called [name] and [name] states she feels SNF is a good idea. [name] states application for disability has been apprived to begin January of 2106.   CM handed the phone over to CSW to begin arrangements.  CM notified [place] rep, [name] who states she will continue to follow.  No other CM needs were communicated.  Mariane Masters, BSN, [place].  [id number] Rosana Hoes, RN, BSN, CCM Chart reviewed. Discharge needs and patient's stay to be reviewed and followed by case manager. per previous admissions patient has been referred to the  wellness outpt clinic.

## 2014-04-25 NOTE — Plan of Care (Signed)
Problem: Phase I Progression Outcomes Goal: Voiding-avoid urinary catheter unless indicated Outcome: Completed/Met Date Met:  04/25/14     

## 2014-04-25 NOTE — Progress Notes (Addendum)
[facility] PROGRESS NOTE  Mckenzy Salazar DGL:[id number] DOB: 1955-08-27 DOA: 04/24/2014 PCP: Lorayne Marek, MD  Brief Summary  [name] is a 57 y.o. Female with metastatic breast cancer followed by Dr. Jana Hakim, last chemo on 03/27/14, COPD and hypertension who was admitted to [facility] from 10/26 through 11/4 for sepsis due to pneumonia and COPD exacerbation.  She was discharged on steroids and levofloxacin and remained on her 2L [place].  She reportedly felt well since discharge from hospital on 04/19/14.  She continued to have RUQ pain but developed recurrent high fevers with diarrhea (acute on chronic).  Her family member called her on the night of admission and discovered that she was confused so they had her brought back to the ER.  In the ER, she was tachycardic and tachypneic and was admitted to stepdown for recurrent sepsis on broad spectrum antibiotics.  UA and CXR did NOT show obvious signs of infection.  C. Diff was negative.  Her temperature has clearly trended down on IV antibiotics, however.    Assessment/Plan  Sepsis, source not clear.  Recently recent admission from 10/26-11/4 for Sepsis, encephalopathy, and pneumonia/respiratory failure.  On Levaquin. WBC trending down. Just finished prednisone for COPD exacerbation. Last Chemo 03/27/14. Concern for possible HCAP vs intraabdominal process such as Cdiff.  Port infection possible but less likely.  Temperature trending down on broad spectrum abx.  - Lactic acid nml.  - CT abd/pelvis pending - Stool culture, Cdiff PCR  - Enteric precautions - Continue Vanc, Aztreonam, [place] day 2 - BCX x2 pending - UCX pending - procalcitonin pending - BP stable, lactate wnl, making urine, no recent diarrhea >> reduce IVF  Mild encephalopathy: Mild intermittent confusion today. Likely secondary to infection/sepsis. This is a recurring issue for the pt w/ acute illnesses - treatment as above - Ammonia wnl  Breast Cancer: Last Chemo 03/27/14.   - Oncology, Dr. Jana Hakim   DVT: L DVT in gastrocnemius vein. Compliant on lovenox at home - cont lovenox unless plt < 50K  COPD. On 2L [place] at home, no current respiratory distress - Continue home pulmicort and xopenex - one time solumedrol 125mg  - O2 [place]   Chronic RUQ pain and MSK pain: at baseline per pt - continue percocet and flexeril -  Add lidocaine patch to left chest wall  HTN: normotensive - continue Norvasc   Leukocytosis, slightly worse today, however, received steroids yesterday -  Continue to trend WBC  Normocytic anemia, likely anemia of chronic disease from malignancy -  approxiamtely stable from yesterday  Thrombocytopenia, worsening, may be due to DIC from new infection.  HIT less likely given timing -  INR, fibrinogen, smear for schistocytes -  Trend plts -  Anti-plt ab -  Oncology to consult  Diet:  regular Access:  port IVF:  yes Proph:  lovenox  Code Status: full Family Communication: patient alone Disposition Plan:  Pending further evaluation for source of sepsis   Consultants:  Oncology  Procedures:  CXR  Antibiotics:  Levofloxacin 11/9 x 1  Flagyl 11/9 >>  vanc 11/9 >>   Aztreonam 11/9 >>  HPI/Subjective:  Patient states that she continues to have some abdominal pain, but no diarrhea since admission.  She has some left chest wall pain.  Breathing is normal and no increased cough. No vomiting or dysuria.    Objective: Filed Vitals:   04/25/14 0000 04/25/14 0200 04/25/14 0400 04/25/14 0600  BP:  115/72 119/61 118/64  Pulse:  104 111 99  Temp:  98.6 F (37 C)  98.3 F (36.8 C)   TempSrc: Oral  Oral   Resp:  17 21 15   Height:      Weight:      SpO2:  100% 100% 100%    Intake/Output Summary (Last 24 hours) at 04/25/14 0810 Last data filed at 04/25/14 3903  Gross per 24 hour  Intake 1112.5 ml  Output   2600 ml  Net -1487.5 ml   Filed Weights   04/24/14 1625 04/24/14 2053  Weight: 52.164 kg (115 lb) 54.5 kg (120 lb  2.4 oz)    Exam:   General:  Thin BF, No acute distress  HEENT:  NCAT, MMM  Cardiovascular:  RRR, nl S1, S2 no mrg, 2+ pulses, warm extremities.  Port appears c/d/i without surroudnign erythema or induration  Respiratory:  CTAB, no increased WOB  Abdomen:   NABS, soft, mildly distended, NT.    MSK:   Normal tone and bulk, no LEE.  Has TTP in the left lower ribs just below the left breast  Neuro:  Grossly moves all extremities  Psych:  Asleep but arouseable.  Was able to answer questions but was sometimes slow and questions had to be repeated.    Data Reviewed: Basic Metabolic Panel:  Recent Labs Lab 04/24/14 1653 04/25/14 0550  NA 138 142  K 3.4* 3.7  CL 99 103  CO2 22 24  GLUCOSE 68* 132*  BUN 14 11  CREATININE 0.69 0.55  CALCIUM 8.7 9.0   Liver Function Tests:  Recent Labs Lab 04/24/14 1653 04/25/14 0550  AST 49* 43*  ALT 8 8  ALKPHOS 101 108  BILITOT 0.4 0.3  PROT 5.7* 5.9*  ALBUMIN 2.1* 2.0*   No results for input(s): LIPASE, AMYLASE in the last 168 hours.  Recent Labs Lab 04/24/14 2200  AMMONIA 19   CBC:  Recent Labs Lab 04/19/14 0355 04/24/14 1653 04/25/14 0550  WBC 19.0* 15.0* 15.9*  NEUTROABS  --  12.1*  --   HGB 9.3* 8.5* 8.4*  HCT 28.9* 26.0* 25.2*  MCV 92.9 91.2 90.3  PLT 114* 77* 63*   Cardiac Enzymes:  Recent Labs Lab 04/24/14 2057  TROPONINI <0.30   BNP (last 3 results)  Recent Labs  04/10/14 1537  PROBNP 298.3*   CBG: No results for input(s): GLUCAP in the last 168 hours.  No results found for this or any previous visit (from the past 240 hour(s)).   Studies: Dg Chest Port 1 View  04/24/2014   CLINICAL DATA:  Fever and tachycardia.  EXAM: PORTABLE CHEST - 1 VIEW  COMPARISON:  04/15/2014 as well as chest CT 04/11/2014  FINDINGS: Left subclavian Port-A-Cath is unchanged with tip in the region of the cavoatrial junction. Low lung volumes with mild prominence of the perihilar markings. Stable known right  suprahilar mass measuring approximately 3 cm. Possible small amount of bilateral pleural fluid. Cardiomediastinal silhouette and remainder of the exam is unchanged.  IMPRESSION: Low lung volumes with findings suggesting mild vascular congestion. Continued small amount bilateral pleural fluid.  Stable known right suprahilar mass.   Electronically Signed   By: Marin Olp M.D.   On: 04/24/2014 17:33    Scheduled Meds: . amLODipine  2.5 mg Oral Daily  . aztreonam  2 g Intravenous Q8H  . budesonide  0.5 mg Nebulization Q12H  . enoxaparin  80 mg Subcutaneous Q24H  . iohexol  25 mL Oral Q1 Hr x 2  . lidocaine  1 patch Transdermal Q24H  .  LORazepam  0.5 mg Oral 3 times per day  . metronidazole  500 mg Intravenous Q8H  . pantoprazole  40 mg Oral Daily  . sodium chloride  3 mL Intravenous Q12H  . vancomycin  500 mg Intravenous Q12H   Continuous Infusions: . sodium chloride 150 mL/hr at 04/24/14 2135    Active Problems:   COPD (chronic obstructive pulmonary disease)   Breast cancer of upper-outer quadrant of right female breast   Sepsis   Hypertension   Leukocytosis   DVT (deep venous thrombosis)   Abdominal pain   Blood poisoning    Time spent: 30 min    Deforrest Bogle, [place] [facility] Pager [phone]. If 7PM-7AM, please contact night-coverage at [email or web address], password [email or web address] 04/25/2014, 8:10 AM  LOS: 1 day

## 2014-04-25 NOTE — Evaluation (Addendum)
Occupational Therapy Evaluation Patient Details Name: [name] MRN: [id number] DOB: [birth year] Today's Date: 04/25/2014    History of Present Illness Pt with recent admission from 10/26-11/4 for Sepsis, encephalopathy, and pneumonia/respiratory failure. Admitted 04/24/14 for diarrhea and fever.    Clinical Impression   Pt up to recliner to sit up. Did well with walker to pivot around to chair. Needs verbal cues for safety. As long as family can provide 24/7 as recommended last admission (less than a week ago) then feel she can d/c to home with [place]. Will follow on acute to progress independence with self care tasks.     Follow Up Recommendations  Home health OT;Supervision/Assistance - 24 hour (as long as family can resume 24/7 care.)    Equipment Recommendations  None recommended by OT    Recommendations for Other Services       Precautions / Restrictions Precautions Precautions: Fall Precaution Comments: requires O2/monitor sats Restrictions Weight Bearing Restrictions: No      Mobility Bed Mobility Overal bed mobility: Needs Assistance Bed Mobility: Supine to Sit     Supine to sit: HOB elevated;Min guard        Transfers Overall transfer level: Needs assistance Equipment used: Rolling walker (2 wheeled) Transfers: Sit to/from Stand Sit to Stand: Min guard         General transfer comment: min guard for safety. Pt trying to pull up on walker.     Balance                                            ADL   Eating/Feeding: Independent;Sitting   Grooming: Wash/dry hands;Set up;Sitting   Upper Body Bathing: Set up;Sitting   Lower Body Bathing: Minimal assistance;Sit to/from stand   Upper Body Dressing : Set up;Sitting   Lower Body Dressing: Minimal assistance;Sit to/from stand   Toilet Transfer: Minimal assistance;Stand-pivot;RW   Toileting- Clothing Manipulation and Hygiene: Minimal assistance;Sit to/from stand          General ADL Comments: Pt reports she was able to perform ADL since recent d/c up until she was admitted this time but reports it wasnt easy. She was using the walker and had the 3in1 for the toilet she was using. Pt used female urinal from bed and then assisted pt to transfer to chair. Informed nursing secretary of urine output to give pt's nursing tech/nurse. Pt with a catching pain in L rib/upper abdomen as she was repositioning in chair but ok after assisted her to be more comfortable.      Vision                     Perception     Praxis      Pertinent Vitals/Pain Pain Assessment: No/denies pain  HR 119 with activity HR once in chair and resting 102 O2 on 3L 100%     Hand Dominance     Extremity/Trunk Assessment Upper Extremity Assessment Upper Extremity Assessment: Generalized weakness           Communication Communication Communication: No difficulties   Cognition Arousal/Alertness: Awake/alert Behavior During Therapy: WFL for tasks assessed/performed Overall Cognitive Status: Within Functional Limits for tasks assessed                     General Comments       Exercises  Shoulder Instructions      Home Living Family/patient expects to be discharged to:: Private residence Living Arrangements: Alone Available Help at Discharge: Family Type of Home: Apartment Home Access: Stairs to enter [employer] of Steps: 4 Entrance Stairs-Rails: None Home Layout: One level;Full bath on main level     Bathroom Shower/Tub: Tub/shower unit   Bathroom Toilet: Handicapped height     Home Equipment: [place] - single point;Walker - 2 wheels;Bedside commode   Additional Comments: obtained walker and BSC this past admission      Prior Functioning/Environment          Comments: was using walker to get around. Reports she was able to perform ADL and toileting and family nearby.    OT Diagnosis: Generalized weakness   OT Problem  List: Decreased strength;Decreased knowledge of use of DME or AE   OT Treatment/Interventions: Self-care/ADL training;Therapeutic activities;DME and/or AE instruction;Patient/family education    OT Goals(Current goals can be found in the care plan section) Acute Rehab OT Goals Patient Stated Goal: none stated. OT Goal Formulation: With patient Time For Goal Achievement: 05/09/14 Potential to Achieve Goals: Good  OT Frequency: Min 2X/week   Barriers to D/C:            Co-evaluation              End of Session Equipment Utilized During Treatment: Rolling walker  Activity Tolerance: Patient tolerated treatment well Patient left: in chair;with call bell/phone within reach   Time: 1220-1240 OT Time Calculation (min): 20 min Charges:  OT General Charges $OT Visit: 1 Procedure OT Evaluation $Initial OT Evaluation Tier I: 1 Procedure OT Treatments $Therapeutic Activity: 8-22 mins G-Codes:    Jules Schick  [phone] 04/25/2014, 12:53 PM

## 2014-04-25 NOTE — Progress Notes (Signed)
PT Cancellation Note  Patient Details Name: [name] MRN: [id number] DOB: 1955/07/19   Cancelled Treatment:    Reason Eval/Treat Not Completed: Other (comment) (Pt declined to participate at this time-just back to bed. Pt requested PT check back on tomorrow. )   Weston Anna, MPT Pager: [phone]

## 2014-04-25 NOTE — Progress Notes (Signed)
[facility]  Patient Status: Active (receiving services up to time of hospitalization)  [facility] is providing the following services: RN and MSW  If patient discharges after hours, please call [phone].   [name] 04/25/2014, 10:59 AM

## 2014-04-25 NOTE — Progress Notes (Signed)
CRITICAL VALUE ALERT  Critical value received:  Blood Culture = Anaerobic (+) Gram (+) Cocci & Pairs  Date of notification:  04/25/2014  Time of notification:  22:55  Critical value read back:Yes.    Nurse who received alert:  Bethann Humble, RN  MD notified (1st page):  Tylene Fantasia, NP [facility]  Time of first page:  22:55  MD notified (2nd page):  Time of second page:  Responding MD:  Tylene Fantasia, NP [facility]  Time MD responded:  22:55

## 2014-04-25 NOTE — Progress Notes (Signed)
Pt refuses cpap tonight.  Pt remains on 2lnc, sats 100% at this time.  Pt was advised that RT is available all night and to call should she change her mind.

## 2014-04-25 NOTE — Progress Notes (Signed)
PT Cancellation Note  Patient Details Name: [name] MRN: [id number] DOB: 28-Dec-1955   Cancelled Treatment:    Reason Eval/Treat Not Completed: Other (comment) (on Bedrest oeder until 9:15 today. will  evaluate  accordingly.)   Claretha Cooper 04/25/2014, 7:58 AM Tresa Endo PT [phone]

## 2014-04-26 ENCOUNTER — Inpatient Hospital Stay (HOSPITAL_COMMUNITY): Payer: Medicaid Other

## 2014-04-26 DIAGNOSIS — D649 Anemia, unspecified: Secondary | ICD-10-CM

## 2014-04-26 DIAGNOSIS — E876 Hypokalemia: Secondary | ICD-10-CM

## 2014-04-26 DIAGNOSIS — D696 Thrombocytopenia, unspecified: Secondary | ICD-10-CM

## 2014-04-26 DIAGNOSIS — I82409 Acute embolism and thrombosis of unspecified deep veins of unspecified lower extremity: Secondary | ICD-10-CM

## 2014-04-26 DIAGNOSIS — R197 Diarrhea, unspecified: Secondary | ICD-10-CM

## 2014-04-26 LAB — BASIC METABOLIC PANEL
Anion gap: 14 (ref 5–15)
BUN: 12 mg/dL (ref 6–23)
CO2: 21 mEq/L (ref 19–32)
CREATININE: 0.67 mg/dL (ref 0.50–1.10)
Calcium: 8.9 mg/dL (ref 8.4–10.5)
Chloride: 101 mEq/L (ref 96–112)
Glucose, Bld: 135 mg/dL — ABNORMAL HIGH (ref 70–99)
POTASSIUM: 3 meq/L — AB (ref 3.7–5.3)
Sodium: 136 mEq/L — ABNORMAL LOW (ref 137–147)

## 2014-04-26 LAB — CBC
HCT: 23.4 % — ABNORMAL LOW (ref 36.0–46.0)
Hemoglobin: 7.6 g/dL — ABNORMAL LOW (ref 12.0–15.0)
MCH: 29.7 pg (ref 26.0–34.0)
MCHC: 32.5 g/dL (ref 30.0–36.0)
MCV: 91.4 fL (ref 78.0–100.0)
PLATELETS: 52 10*3/uL — AB (ref 150–400)
RBC: 2.56 MIL/uL — ABNORMAL LOW (ref 3.87–5.11)
RDW: 20.4 % — AB (ref 11.5–15.5)
WBC: 12.1 10*3/uL — ABNORMAL HIGH (ref 4.0–10.5)

## 2014-04-26 LAB — VANCOMYCIN, TROUGH: Vancomycin Tr: 8.1 ug/mL — ABNORMAL LOW (ref 10.0–20.0)

## 2014-04-26 MED ORDER — ACETAMINOPHEN 325 MG PO TABS
650.0000 mg | ORAL_TABLET | Freq: Once | ORAL | Status: AC
  Administered 2014-04-26: 650 mg via ORAL
  Filled 2014-04-26: qty 2

## 2014-04-26 MED ORDER — VANCOMYCIN HCL IN DEXTROSE 1-5 GM/200ML-% IV SOLN
1000.0000 mg | Freq: Two times a day (BID) | INTRAVENOUS | Status: DC
  Administered 2014-04-27 – 2014-04-28 (×4): 1000 mg via INTRAVENOUS
  Filled 2014-04-26 (×5): qty 200

## 2014-04-26 MED ORDER — OXYCODONE-ACETAMINOPHEN 5-325 MG PO TABS
1.0000 | ORAL_TABLET | ORAL | Status: DC | PRN
  Administered 2014-04-26 (×2): 2 via ORAL
  Filled 2014-04-26 (×2): qty 2

## 2014-04-26 MED ORDER — VANCOMYCIN HCL 500 MG IV SOLR
500.0000 mg | Freq: Once | INTRAVENOUS | Status: AC
  Administered 2014-04-26: 500 mg via INTRAVENOUS
  Filled 2014-04-26: qty 500

## 2014-04-26 MED ORDER — POTASSIUM CHLORIDE CRYS ER 20 MEQ PO TBCR
40.0000 meq | EXTENDED_RELEASE_TABLET | Freq: Once | ORAL | Status: AC
  Administered 2014-04-26: 40 meq via ORAL
  Filled 2014-04-26: qty 2

## 2014-04-26 MED ORDER — POTASSIUM CHLORIDE CRYS ER 20 MEQ PO TBCR
40.0000 meq | EXTENDED_RELEASE_TABLET | Freq: Two times a day (BID) | ORAL | Status: AC
Start: 2014-04-26 — End: 2014-04-26
  Administered 2014-04-26 (×2): 40 meq via ORAL
  Filled 2014-04-26 (×2): qty 2

## 2014-04-26 MED ORDER — MORPHINE SULFATE 4 MG/ML IJ SOLN
4.0000 mg | INTRAMUSCULAR | Status: DC | PRN
  Administered 2014-04-27 – 2014-05-02 (×8): 4 mg via INTRAVENOUS
  Filled 2014-04-26 (×8): qty 1

## 2014-04-26 NOTE — Progress Notes (Signed)
ANTIBIOTIC CONSULT NOTE - INITIAL  Pharmacy Consult for aztreonam, vancomycin, metronidazole Indication: sepsis  Allergies  Allergen Reactions  . Penicillins Other (See Comments)    Reaction a long time ago    Patient Measurements: Height: 4\' 11"  (149.9 cm) Weight: 119 lb 0.8 oz (54 kg) IBW/kg (Calculated) : 43.2  Vital Signs: Temp: 100.2 F (37.9 C) (11/11 0800) Temp Source: Core (Comment) (11/11 0700) BP: 135/63 mmHg (11/11 0800) Pulse Rate: 101 (11/11 0800) Intake/Output from previous day: 11/10 0701 - 11/11 0700 In: 3563.8 [P.O.:900; I.V.:1913.8; IV Piggyback:750] Out: 2500 [Urine:2500] Intake/Output from this shift: Total I/O In: 350 [I.V.:300; IV Piggyback:50] Out: 600 [Urine:600]  Labs:  Recent Labs  04/24/14 1653 04/25/14 0550 04/25/14 0900 04/26/14 0510  WBC 15.0* 15.9* 15.7* 12.1*  HGB 8.5* 8.4* 8.4* 7.6*  PLT 77* 63* 59* 52*  CREATININE 0.69 0.55  --  0.67   Estimated Creatinine Clearance: 56.8 mL/min (by C-G formula based on Cr of 0.67). No results for input(s): VANCOTROUGH, VANCOPEAK, VANCORANDOM, GENTTROUGH, GENTPEAK, GENTRANDOM, TOBRATROUGH, TOBRAPEAK, TOBRARND, AMIKACINPEAK, AMIKACINTROU, AMIKACIN in the last 72 hours.   Microbiology: Recent Results (from the past 720 hour(s))  Urine culture     Status: None   Collection Time: 04/03/14  9:44 PM  Result Value Ref Range Status   Specimen Description URINE, CLEAN CATCH  Final   Special Requests NONE  Final   Culture  Setup Time   Final    04/04/2014 01:52 Performed at [place] Performed at [facility]  Final   Culture NO GROWTH Performed at [facility]  Final   Report Status 04/04/2014 FINAL  Final  TECHNOLOGIST REVIEW     Status: None   Collection Time: 04/10/14  2:27 PM  Result Value Ref Range Status   Technologist Review 2% Nrbcs, 3% metamyelocytes  Final  Blood Culture (routine x 2)     Status: None   Collection Time: 04/10/14   3:20 PM  Result Value Ref Range Status   Specimen Description BLOOD LEFT CHEST  Final   Special Requests BOTTLES DRAWN AEROBIC AND ANAEROBIC 5 CC EA  Final   Culture  Setup Time   Final    04/10/2014 21:17 Performed at [facility]    Culture   Final    NO GROWTH 5 DAYS Performed at [facility]    Report Status 04/16/2014 FINAL  Final  Blood Culture (routine x 2)     Status: None   Collection Time: 04/10/14  3:25 PM  Result Value Ref Range Status   Specimen Description BLOOD LEFT ARM  Final   Special Requests BOTTLES DRAWN AEROBIC AND ANAEROBIC 8CC  Final   Culture  Setup Time   Final    04/11/2014 03:55 Performed at [place]   Final    NO GROWTH 5 DAYS Performed at [facility]    Report Status 04/17/2014 FINAL  Final  Urine culture     Status: None   Collection Time: 04/10/14  3:37 PM  Result Value Ref Range Status   Specimen Description URINE, CATHETERIZED  Final   Special Requests NONE  Final   Culture  Setup Time   Final    04/10/2014 21:31 Performed at [place] Performed at [facility]  Final   Culture NO GROWTH Performed at [facility]  Final   Report Status 04/11/2014  FINAL  Final  MRSA PCR Screening     Status: None   Collection Time: 04/10/14 10:20 PM  Result Value Ref Range Status   MRSA by PCR NEGATIVE NEGATIVE Final    Comment:        The GeneXpert MRSA Assay (FDA approved for NASAL specimens only), is one component of a comprehensive MRSA colonization surveillance program. It is not intended to diagnose MRSA infection nor to guide or monitor treatment for MRSA infections.  Culture, blood (routine x 2)     Status: None   Collection Time: 04/11/14  5:50 PM  Result Value Ref Range Status   Specimen Description BLOOD LEFT ARM  Final   Special Requests BOTTLES DRAWN AEROBIC AND ANAEROBIC  10 CC  Final   Culture  Setup Time   Final    04/11/2014  22:33 Performed at [facility]    Culture   Final    NO GROWTH 5 DAYS Performed at [facility]    Report Status 04/17/2014 FINAL  Final  Culture, blood (routine x 2)     Status: None   Collection Time: 04/11/14  5:59 PM  Result Value Ref Range Status   Specimen Description BLOOD LEFT ARM  Final   Special Requests BOTTLES DRAWN AEROBIC AND ANAEROBIC  10 CC  Final   Culture  Setup Time   Final    04/11/2014 22:33 Performed at [facility]    Culture   Final    NO GROWTH 5 DAYS Note: Culture results may be compromised due to an excessive volume of blood received in culture bottles. Performed at [facility]    Report Status 04/17/2014 FINAL  Final  Blood Culture (routine x 2)     Status: None (Preliminary result)   Collection Time: 04/24/14  4:53 PM  Result Value Ref Range Status   Specimen Description BLOOD LEFT HAND  Final   Special Requests BOTTLES DRAWN AEROBIC AND ANAEROBIC 10 ML  Final   Culture  Setup Time   Final    04/24/2014 22:39 Performed at [facility]    Culture   Final           BLOOD CULTURE RECEIVED NO GROWTH TO DATE CULTURE WILL BE HELD FOR 5 DAYS BEFORE ISSUING A FINAL NEGATIVE REPORT Performed at [facility]    Report Status PENDING  Incomplete  Blood Culture (routine x 2)     Status: None (Preliminary result)   Collection Time: 04/24/14  4:53 PM  Result Value Ref Range Status   Specimen Description BLOOD LEFT ANTECUBITAL  Final   Special Requests BOTTLES DRAWN AEROBIC AND ANAEROBIC  5 CC  Final   Culture  Setup Time   Final    04/24/2014 22:39 Performed at [facility]    Culture   Final    [place] IN PAIRS Note: Gram Stain Report Called to,Read Back By and Verified With: DELISA COBB ON 04/25/2014 AT 10:45P BY WILEJ Performed at [facility]    Report Status PENDING  Incomplete  Urine culture     Status: None (Preliminary result)   Collection Time: 04/24/14  5:02 PM   Result Value Ref Range Status   Specimen Description URINE, CLEAN CATCH  Final   Special Requests NONE  Final   Culture  Setup Time   Final    04/24/2014 23:08 Performed at [place] PENDING  Incomplete   Culture  Final    Culture reincubated for better growth Performed at [place] PENDING  Incomplete  Clostridium Difficile by PCR     Status: None   Collection Time: 04/25/14  9:42 AM  Result Value Ref Range Status   C difficile by pcr NEGATIVE NEGATIVE Final    Comment: Performed at [place] History: Past Medical History  Diagnosis Date  . Acid reflux   . Asthma   . COPD (chronic obstructive pulmonary disease)   . Cancer     lumpectomy, radiation 2009  . Seasonal allergies   . Alcohol abuse   . Elevated d-dimer     negative chest CT  . Allergy   . Emphysema of lung   . Hot flashes   . Reading disorder   . Impaired writing skills   . Wears glasses   . Wears dentures     top  . HOH (hard of hearing)     left ear  . S/P radiation therapy 01/13/14    SRS left parietal 20Gy    Medications:  Anti-infectives    Start     Dose/Rate Route Frequency Ordered Stop   04/25/14 1800  levofloxacin (LEVAQUIN) IVPB 750 mg  Status:  Discontinued     750 mg100 mL/hr over 90 Minutes Intravenous Every 24 hours 04/24/14 1859 04/24/14 2142   04/25/14 0600  vancomycin (VANCOCIN) 500 mg in sodium chloride 0.9 % 100 mL IVPB     500 mg100 mL/hr over 60 Minutes Intravenous Every 12 hours 04/24/14 1859     04/25/14 0600  metroNIDAZOLE (FLAGYL) IVPB 500 mg     500 mg100 mL/hr over 60 Minutes Intravenous Every 8 hours 04/24/14 2146     04/25/14 0000  aztreonam (AZACTAM) 2 g in dextrose 5 % 50 mL IVPB     2 g100 mL/hr over 30 Minutes Intravenous Every 8 hours 04/24/14 1859     04/24/14 2130  metroNIDAZOLE (FLAGYL) IVPB 500 mg     500 mg100 mL/hr over 60 Minutes Intravenous  Once 04/24/14 2116 04/24/14 2235    04/24/14 1645  levofloxacin (LEVAQUIN) IVPB 750 mg     750 mg100 mL/hr over 90 Minutes Intravenous  Once 04/24/14 1644 04/24/14 1953   04/24/14 1645  aztreonam (AZACTAM) 2 g in dextrose 5 % 50 mL IVPB     2 g100 mL/hr over 30 Minutes Intravenous  Once 04/24/14 1644 04/24/14 1816   04/24/14 1645  vancomycin (VANCOCIN) IVPB 1000 mg/200 mL premix     1,000 mg200 mL/hr over 60 Minutes Intravenous  Once 04/24/14 1644 04/24/14 1853     Assessment: 5 yoF presented to [facility] ED on 11/9 with fever and tachycardia. PMH includes metastatic breast cancer (last chemo given 10/5), COPD and HTN. She was recently admitted (10/26-11/4) with sepsis and pneumonia; she completed 7 days of vanc/cefepime then transitioned to oral Levaquin. Pharmacy was initially consulted to dose vancomycin, levaquin, and aztreonam for suspected sepsis.  Levaquin was d/c and pharmacy is consulted to dose metronidazole for possible intra-abdominal/Cdiff infection.  11/9 >> Vanc >> 11/9 >> aztreonam >>  11/9 >> levaquin >> 11/9 11/9 >> metronidazole >>  Tmax: 100.8 WBCs: 12.1 (prednisone PTA) Renal: SCr 0.67, CrCl ~ 90 ml/min Lactic acid: 1.46  11/9 blood x2: GPC in pairs pending 11/9 urine: reincubated pending 11/9 cdiff: neg  Goal of Therapy:  Vancomycin trough level 15-20 mcg/ml  Appropriate abx dosing, eradication of infection.  Plan:   Continue Metronidazole 500mg  IV q8h, Aztreonam 2g IV q8h  Continue Vancomycin 500mg  IV q12h.  Obtain Vancomycin trough tonight at 1700  Follow up renal fxn and culture results.  [name], PharmD Pager: [phone] 04/26/2014 11:24 AM

## 2014-04-26 NOTE — Plan of Care (Signed)
Problem: Phase II Progression Outcomes Goal: Vital signs remain stable Outcome: Not Applicable Date Met:  18/20/99

## 2014-04-26 NOTE — Consult Note (Signed)
UNASSIGNED PATIENT Reason for Consult: Needs ERCP. Referring Physician: THP  [name] is an 58 y.o. female.  HPI:  Patient is a 59 year old black female, with multiple problems listed below, followed by Dr. Jana Hakim for metastatic breast cancer. She received her last chemotherapy  on 03/27/14, was admitted to [facility] from 03/2614 through 04/19/14 for sepsis due to pneumonia complicated by COPD exacerbation. She was discharged on steroids and levofloxacin. She develop RUQ pain but developed recurrent high fevers with diarrhea. She was found to be confused and so the family brought her to the ER.She was found to be septic without a definite of infection.C. Difficile toxin assay  was negative. Her fevers have trended down on IV antibiotics since admission.CT of the abdomen and pelvis revealed extra and intrahepatic ductal dilatation with a possible stricture in near the ampulla but the MRCP did not reveal any such strictures and the findings were thought to be due to post-op changes. Patient denies having any abdominal pain, nausea or vomiting. She denies having any diarrhea or constipation, melena or hematochezia.   Past Medical History  Diagnosis Date  . Acid reflux   . Asthma   . COPD (chronic obstructive pulmonary disease)   . Cancer     lumpectomy, radiation 2009  . Seasonal allergies   . Alcohol abuse   . Elevated d-dimer     negative chest CT  . Allergy   . Emphysema of lung   . Hot flashes   . Reading disorder   . Impaired writing skills   . Wears glasses   . Wears dentures     top  . HOH (hard of hearing)     left ear  . S/P radiation therapy 01/13/14    SRS left parietal 20Gy   Past Surgical History  Procedure Laterality Date  . Breast lumpectomy    . Abdominal hysterectomy    . Portacath placement Left 12/30/2013    Procedure: INSERTION PORT-A-CATH;  Surgeon: Merrie Roof, MD;  Location: [facility];  Service: General;  Laterality: Left;  subclavian  area   Family History  Problem Relation Age of Onset  . Hypertension Other    Social History:  reports that she quit smoking about 4 weeks ago. Her smoking use included Cigarettes. She has a 30 pack-year smoking history. She has never used smokeless tobacco. She reports that she drinks alcohol. She reports that she does not use illicit drugs.  Allergies:  Allergies  Allergen Reactions  . Penicillins Other (See Comments)    Reaction a long time ago   Medications: I have reviewed the patient's current medications.  Results for orders placed or performed during the hospital encounter of 04/24/14 (from the past 48 hour(s))  Troponin I     Status: None   Collection Time: 04/24/14  8:57 PM  Result Value Ref Range   Troponin I <0.30 <0.30 ng/mL    Comment:        Due to the release kinetics of cTnI, a negative result within the first hours of the onset of symptoms does not rule out myocardial infarction with certainty. If myocardial infarction is still suspected, repeat the test at appropriate intervals.   Protime-INR     Status: Abnormal   Collection Time: 04/24/14  8:57 PM  Result Value Ref Range   Prothrombin Time 15.4 (H) 11.6 - 15.2 seconds   INR 1.21 0.00 - 1.49  APTT     Status: Abnormal  Collection Time: 04/24/14  8:57 PM  Result Value Ref Range   aPTT 39 (H) 24 - 37 seconds    Comment:        IF BASELINE aPTT IS ELEVATED, SUGGEST PATIENT RISK ASSESSMENT BE USED TO DETERMINE APPROPRIATE ANTICOAGULANT THERAPY.   Ammonia     Status: None   Collection Time: 04/24/14 10:00 PM  Result Value Ref Range   Ammonia 19 11 - 60 umol/L  CBC     Status: Abnormal   Collection Time: 04/25/14  5:50 AM  Result Value Ref Range   WBC 15.9 (H) 4.0 - 10.5 K/uL    Comment: ADJUSTED FOR NUCLEATED RBC'S   RBC 2.79 (L) 3.87 - 5.11 MIL/uL   Hemoglobin 8.4 (L) 12.0 - 15.0 g/dL   HCT 25.2 (L) 36.0 - 46.0 %   MCV 90.3 78.0 - 100.0 fL   MCH 30.1 26.0 - 34.0 pg   MCHC 33.3 30.0 - 36.0  g/dL   RDW 20.0 (H) 11.5 - 15.5 %   Platelets 63 (L) 150 - 400 K/uL    Comment: REPEATED TO VERIFY SPECIMEN CHECKED FOR CLOTS CONSISTENT WITH PREVIOUS RESULT   Comprehensive metabolic panel     Status: Abnormal   Collection Time: 04/25/14  5:50 AM  Result Value Ref Range   Sodium 142 137 - 147 mEq/L   Potassium 3.7 3.7 - 5.3 mEq/L   Chloride 103 96 - 112 mEq/L   CO2 24 19 - 32 mEq/L   Glucose, Bld 132 (H) 70 - 99 mg/dL   BUN 11 6 - 23 mg/dL   Creatinine, Ser 0.55 0.50 - 1.10 mg/dL   Calcium 9.0 8.4 - 10.5 mg/dL   Total Protein 5.9 (L) 6.0 - 8.3 g/dL   Albumin 2.0 (L) 3.5 - 5.2 g/dL   AST 43 (H) 0 - 37 U/L   ALT 8 0 - 35 U/L   Alkaline Phosphatase 108 39 - 117 U/L   Total Bilirubin 0.3 0.3 - 1.2 mg/dL   GFR calc non Af Amer >90 >90 mL/min   GFR calc Af Amer >90 >90 mL/min    Comment: (NOTE) The eGFR has been calculated using the CKD EPI equation. This calculation has not been validated in all clinical situations. eGFR's persistently <90 mL/min signify possible Chronic Kidney Disease.    Anion gap 15 5 - 15  Protime-INR     Status: Abnormal   Collection Time: 04/25/14  9:00 AM  Result Value Ref Range   Prothrombin Time 15.9 (H) 11.6 - 15.2 seconds   INR 1.26 0.00 - 1.49  Fibrinogen     Status: Abnormal   Collection Time: 04/25/14  9:00 AM  Result Value Ref Range   Fibrinogen 615 (H) 204 - 475 mg/dL  Differential     Status: Abnormal   Collection Time: 04/25/14  9:00 AM  Result Value Ref Range   Neutrophils Relative % 84 (H) 43 - 77 %   Lymphocytes Relative 10 (L) 12 - 46 %   Monocytes Relative 6 3 - 12 %   Eosinophils Relative 0 0 - 5 %   Basophils Relative 0 0 - 1 %   nRBC 13 (H) 0 /100 WBC   Neutro Abs 13.2 (H) 1.7 - 7.7 K/uL   Lymphs Abs 1.6 0.7 - 4.0 K/uL   Monocytes Absolute 0.9 0.1 - 1.0 K/uL   Eosinophils Absolute 0.0 0.0 - 0.7 K/uL   Basophils Absolute 0.0 0.0 - 0.1 K/uL   RBC  Morphology POLYCHROMASIA PRESENT   CBC     Status: Abnormal   Collection  Time: 04/25/14  9:00 AM  Result Value Ref Range   WBC 15.7 (H) 4.0 - 10.5 K/uL    Comment: ADJUSTED FOR NUCLEATED RBC'S   RBC 2.79 (L) 3.87 - 5.11 MIL/uL   Hemoglobin 8.4 (L) 12.0 - 15.0 g/dL   HCT 25.3 (L) 36.0 - 46.0 %   MCV 90.7 78.0 - 100.0 fL   MCH 30.1 26.0 - 34.0 pg   MCHC 33.2 30.0 - 36.0 g/dL   RDW 20.0 (H) 11.5 - 15.5 %   Platelets 59 (L) 150 - 400 K/uL    Comment: PLATELET COUNT CONFIRMED BY SMEAR  Clostridium Difficile by PCR     Status: None   Collection Time: 04/25/14  9:42 AM  Result Value Ref Range   C difficile by pcr NEGATIVE NEGATIVE    Comment: Performed at [facility]  Stool culture     Status: None (Preliminary result)   Collection Time: 04/25/14  9:42 AM  Result Value Ref Range   Specimen Description STOOL    Special Requests NONE    Culture      Culture reincubated for better growth Performed at [facility]    Report Status PENDING   Basic metabolic panel     Status: Abnormal   Collection Time: 04/26/14  5:10 AM  Result Value Ref Range   Sodium 136 (L) 137 - 147 mEq/L   Potassium 3.0 (L) 3.7 - 5.3 mEq/L    Comment: DELTA CHECK NOTED REPEATED TO VERIFY    Chloride 101 96 - 112 mEq/L   CO2 21 19 - 32 mEq/L   Glucose, Bld 135 (H) 70 - 99 mg/dL   BUN 12 6 - 23 mg/dL   Creatinine, Ser 0.67 0.50 - 1.10 mg/dL   Calcium 8.9 8.4 - 10.5 mg/dL   GFR calc non Af Amer >90 >90 mL/min   GFR calc Af Amer >90 >90 mL/min    Comment: (NOTE) The eGFR has been calculated using the CKD EPI equation. This calculation has not been validated in all clinical situations. eGFR's persistently <90 mL/min signify possible Chronic Kidney Disease.    Anion gap 14 5 - 15  CBC     Status: Abnormal   Collection Time: 04/26/14  5:10 AM  Result Value Ref Range   WBC 12.1 (H) 4.0 - 10.5 K/uL   RBC 2.56 (L) 3.87 - 5.11 MIL/uL   Hemoglobin 7.6 (L) 12.0 - 15.0 g/dL   HCT 23.4 (L) 36.0 - 46.0 %   MCV 91.4 78.0 - 100.0 fL   MCH 29.7 26.0 - 34.0 pg   MCHC 32.5  30.0 - 36.0 g/dL   RDW 20.4 (H) 11.5 - 15.5 %   Platelets 52 (L) 150 - 400 K/uL    Comment: REPEATED TO VERIFY CONSISTENT WITH PREVIOUS RESULT   Vancomycin, trough     Status: Abnormal   Collection Time: 04/26/14  5:20 PM  Result Value Ref Range   Vancomycin Tr 8.1 (L) 10.0 - 20.0 ug/mL    Ct Abdomen Pelvis W Contrast  04/25/2014   CLINICAL DATA:  Chronic right upper quadrant abdominal pain. History of metastatic breast cancer with chemotherapy on-going.  EXAM: CT ABDOMEN AND PELVIS WITH CONTRAST  TECHNIQUE: Multidetector CT imaging of the abdomen and pelvis was performed using the standard protocol following bolus administration of intravenous contrast.  CONTRAST:  59m OMNIPAQUE IOHEXOL 300 MG/ML  SOLN  COMPARISON:  CT abdomen pelvis 04/10/2014  FINDINGS: Lung bases: Mild atelectasis of both lung bases. Negative for pleural effusion. Stable prominent 10 mm paraesophageal lymph node on image 7. On image number 1 there is an incompletely imaged probable prominent subcarinal lymph node measuring up to 1.2 cm short axis on this image. Left hilar nodal tissue is incompletely imaged, but measures at least 9 mm short axis. Heart size within normal limits.  The liver is normal in size and enhancement. No suspicious hepatic lesions. Very mild intrahepatic biliary ductal dilatation is stable. Patient is status post cholecystectomy and there is stable chronic prominence of the extrahepatic bile duct measuring up to 15 mm in diameter. There appears to be an abrupt transition from dilated duct to a decompressed distal common bile duct, for which a distal stricture or ampullary lesion cannot be excluded.  The spleen is normal in size and enhancement. Normal adrenal glands. Stable appearance of both kidneys. Negative for renal mass or hydronephrosis. Probable focal scarring in the posterior cortex of the upper pole the left kidney. No visible urinary tract stones. Ureters are decompressed. Urinary bladder has a  normal appearance.  Stomach is well distended with oral contrast and air and appears normal. Small bowel loops are normal in caliber and wall thickness. Normal appearance of the terminal ileum. The colon is normal in caliber and contains multiple scattered diverticula, most prominent in the sigmoid region. Appendix not definitely visualized, but no evidence of appendicitis. Rectum unremarkable. Hysterectomy. No adnexal mass.  Negative for pathologically enlarged mesenteric or retroperitoneal lymph nodes. Atherosclerotic calcification is seen scattered and the normal caliber abdominal aorta and iliac vasculature. Retro aortic left renal vein noted. Negative for ascites.  Small subcutaneous locules of gas in the anterior abdominal wall of the left lower quadrant, likely injection sites, were not previously present.  Diffusely mottled appearance of the visualized bones consistent with bony metastatic disease. The imaged vertebral body heights are preserved. No acute fracture is identified in the visualized thoracolumbar spine, imaged lower ribs, or bony pelvis.  IMPRESSION: 1. No acute findings in the abdomen or pelvis compared to recent CT of 04/10/2014. 2. Persist extrahepatic biliary ductal dilatation to 1.5 cm, with fairly abrupt transition to completely decompressed extrahepatic bile duct. Stable very mild intrahepatic biliary ductal dilatation. The ductal dilatation could be related to the postcholecystectomy state; however, the possibility of a distal ductal stricture or small ampullary of obstructing lesion is difficult to exclude. Correlation with liver function tests is suggested. 3. Diffuse bony metastatic disease M, unchanged. 4. Stable prominent paraesophageal lymph node, and partially imaged probable prominent subcarinal lymph node. 5. Colonic diverticulosis without evidence of acute diverticulitis.   Electronically Signed   By: Curlene Dolphin M.D.   On: 04/25/2014 12:35   Mr Abdomen Mrcp Wo  Cm  04/26/2014   CLINICAL DATA:  Severe right upper quadrant pain, biliary ductal dilatation on CT, history of metastatic breast cancer, chemotherapy ongoing  EXAM: MRI ABDOMEN WITHOUT CONTRAST  (INCLUDING MRCP)  TECHNIQUE: Multiplanar multisequence MR imaging of the abdomen was performed. Heavily T2-weighted images of the biliary and pancreatic ducts were obtained, and three-dimensional MRCP images were rendered by post processing.  COMPARISON:  CT abdomen pelvis dated 04/25/2014  FINDINGS: The examination had to be discontinued prior to completion due to patient discomfort.  Motion degraded images.  Trace bilateral pleural effusions with associated compressive atelectasis at the lung bases.  Liver is unremarkable.  No focal hepatic lesions.  Status post  cholecystectomy. No intrahepatic ductal dilatation. Dilated common duct, measuring up to 13 mm, and abruptly tapering at the ampulla. No choledocholithiasis is seen.  Pancreas is grossly unremarkable. No dilatation of the main pancreatic duct.  Spleen and adrenal glands are grossly unremarkable.  Kidneys are within normal limits.  No hydronephrosis.  Small left para-aortic nodes measuring up to 8 mm short axis.  No abdominal ascites.  Body wall edema.  Soft tissue mass/inflammatory changes involving the right breast (series 3/image 7), incompletely visualized, likely corresponding to known right breast cancer.  IMPRESSION: Study had to be discontinued prior to completion due to patient discomfort. Motion degraded images.  Status post cholecystectomy. No intrahepatic ductal dilatation. Dilated common duct, measuring up to 13 mm, an abruptly tapering at the ampulla. No choledocholithiasis is seen.  In the setting of essentially normal LFTs, this appearance is favored to be postsurgical. Less likely, this could reflect a distal CBD stricture. If there is continued clinical concern, consider ERCP.   Electronically Signed   By: Julian Hy M.D.   On: 04/26/2014  14:15   Mr 3d Recon At Scanner  04/26/2014   CLINICAL DATA:  Severe right upper quadrant pain, biliary ductal dilatation on CT, history of metastatic breast cancer, chemotherapy ongoing  EXAM: MRI ABDOMEN WITHOUT CONTRAST  (INCLUDING MRCP)  TECHNIQUE: Multiplanar multisequence MR imaging of the abdomen was performed. Heavily T2-weighted images of the biliary and pancreatic ducts were obtained, and three-dimensional MRCP images were rendered by post processing.  COMPARISON:  CT abdomen pelvis dated 04/25/2014  FINDINGS: The examination had to be discontinued prior to completion due to patient discomfort.  Motion degraded images.  Trace bilateral pleural effusions with associated compressive atelectasis at the lung bases.  Liver is unremarkable.  No focal hepatic lesions.  Status post cholecystectomy. No intrahepatic ductal dilatation. Dilated common duct, measuring up to 13 mm, and abruptly tapering at the ampulla. No choledocholithiasis is seen.  Pancreas is grossly unremarkable. No dilatation of the main pancreatic duct.  Spleen and adrenal glands are grossly unremarkable.  Kidneys are within normal limits.  No hydronephrosis.  Small left para-aortic nodes measuring up to 8 mm short axis.  No abdominal ascites.  Body wall edema.  Soft tissue mass/inflammatory changes involving the right breast (series 3/image 7), incompletely visualized, likely corresponding to known right breast cancer.  IMPRESSION: Study had to be discontinued prior to completion due to patient discomfort. Motion degraded images.  Status post cholecystectomy. No intrahepatic ductal dilatation. Dilated common duct, measuring up to 13 mm, an abruptly tapering at the ampulla. No choledocholithiasis is seen.  In the setting of essentially normal LFTs, this appearance is favored to be postsurgical. Less likely, this could reflect a distal CBD stricture. If there is continued clinical concern, consider ERCP.   Electronically Signed   By: Julian Hy M.D.   On: 04/26/2014 14:15   Review of Systems  Constitutional: Positive for fever, chills, malaise/fatigue and diaphoresis.  HENT: Negative.   Eyes: Negative.   Respiratory: Positive for shortness of breath.   Cardiovascular: Negative.   Gastrointestinal: Positive for abdominal pain.  Genitourinary: Negative.   Musculoskeletal: Positive for joint pain.  Skin: Negative.   Neurological: Positive for weakness.  Psychiatric/Behavioral: Negative.    Blood pressure 110/59, pulse 83, temperature 98.2 F (36.8 C), temperature source Oral, resp. rate 13, height 4' 11"  (1.499 m), weight 54 kg (119 lb 0.8 oz), SpO2 96 %. Physical Exam  Constitutional: She is oriented to person, place, and  time. She appears well-developed and well-nourished.  HENT:  Head: Normocephalic and atraumatic.  Eyes: Conjunctivae and EOM are normal. Pupils are equal, round, and reactive to light.  Neck: Normal range of motion. Neck supple.  Cardiovascular: Normal rate and regular rhythm.   Respiratory: Effort normal and breath sounds normal.  GI: Soft. Bowel sounds are normal.  Musculoskeletal: Normal range of motion.  Neurological: She is alert and oriented to person, place, and time.  Skin: Skin is warm and dry.  Psychiatric: She has a normal mood and affect. Her behavior is normal. Judgment and thought content normal.   Assessment/Plan: 1) Abnormal CT scan ?bilary stricture: In view of the findings on the MRCP and the fact that she has normal LFT's, I donot feel she needs an ERCP at this time. I feel these findings are due to post-op changes in the biliary tree.  2) Metastatic breast cancer. 3) HTN.  4) COPD.  [name] 04/26/2014, 7:53 PM

## 2014-04-26 NOTE — Progress Notes (Signed)
Patient transferred to room 1343. Report given to Reyne Dumas, RN.  Patient's daughter, [name], notified of patient's new room assignment.  Patient stable at time of transfer.  Zandra Abts [facility]  04/26/2014  6:26 PM

## 2014-04-26 NOTE — Progress Notes (Signed)
[facility] PROGRESS NOTE Interim History: [name] is a 58 y.o. Female with metastatic breast cancer followed by Dr. Jana Hakim, last chemo on 03/27/14, COPD and hypertension who was admitted to [facility] from 10/26 through 11/4 for sepsis due to pneumonia and COPD exacerbation. Discharged on steroids and levofloxacin. She develop RUQ pain but developed recurrent high fevers with diarrhea (acute on chronic). On the night of admission and discovered that she was confused so they had her brought back to the ER. Was found to be septic without a source of infection.C. Diff was negative. Her temperature has clearly trended down on IV antibiotics, however.   Assessment/Plan: Sepsis of unclear Source: - CT abd/pelvis as below, Stool culture negative, Cdiff PCR negative. lactic acid with in normal. - ? If mild cholangitis with RUQ pain, fevers and chills and acute encephalopathy, check MRCP. - she cont to have fevers despite antibiotics, WBC improving. - Continue Vanc, Aztreonam, Metro day 2 - BCX x2 negative till date,  UCXnegative - procalcitonin pending - Bp stable tachycardic, no diarrhea.  Acute encephalopathy:  - resolved. - Mild intermittent confusion today. 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 Compliant on lovenox at home  COPD. On 2L [place] at home, - no current respiratory distress - Continue home pulmicort and xopenex - one time solumedrol 125mg  - O2 [place]   Chronic RUQ pain and MSK pain:  - at baseline per pt -continue percocet and flexeril -Add lidocaine patch to left chest wall  HTN:  - normotensive - continue Norvasc   Leukocytosis: - slightly worse today, however, received steroids yesterday - Continue to trend WBC  Normocytic anemia: - due to chemo therapy. - approxiamtely stable from yesterday.  Thrombocytopenia: - worsening may be due to DIC from new infection. - INR <1.5, fibrinogen > 600, smear for  schistocytes - Trend plts - Anti-plt ab  Code Status: full Family Communication: patient alone Disposition Plan: Pending further evaluation for source of sepsis   Consultants:  GI  Procedures: Ct abd and pelvis 11.11.2015: Persist extrahepatic biliary ductal dilatation to 1.5 cm, with fairly abrupt transition to completely decompressed extrahepatic bile duct. Stable very mild intrahepatic biliary ductal dilatation. The ductal dilatation could be related to the postcholecystectomy state; however, the possibility of a distal ductal stricture or small ampullary of obstructing lesion is difficult to exclude. Correlation with liver function tests is suggested. . Diffuse bony metastatic disease M, unchanged. Stable prominent paraesophageal lymph node, and partially imaged probable prominent subcarinal lymph node.  Antibiotics:  Flagyl, Vanc and aztronam 11.12.2015  HPI/Subjective: Poor appetitie, complaining of RUQ pain.  Objective: Filed Vitals:   04/26/14 0400 04/26/14 0500 04/26/14 0600 04/26/14 0700  BP: 149/71     Pulse: 119 119 117 108  Temp: 101.7 F (38.7 C) 101.8 F (38.8 C) 101.7 F (38.7 C) 100.8 F (38.2 C)  TempSrc: Core (Comment)   Core (Comment)  Resp: 24 23 21 22   Height:      Weight: 54 kg (119 lb 0.8 oz)     SpO2: 100% 100% 100% 100%    Intake/Output Summary (Last 24 hours) at 04/26/14 0724 Last data filed at 04/26/14 0700  Gross per 24 hour  Intake 3563.75 ml  Output   2500 ml  Net 1063.75 ml   Filed Weights   04/24/14 1625 04/24/14 2053 04/26/14 0400  Weight: 52.164 kg (115 lb) 54.5 kg (120 lb 2.4 oz) 54 kg (119 lb 0.8 oz)  Exam:  General: Alert, awake, oriented x3, in no acute distress.  HEENT: No bruits, no goiter.  Heart: Regular rate and rhythm. Lungs: Good air movement, bilateral air movement.  Abdomen: Soft, RUQ tender, positive bowel sounds.  Neuro: Grossly intact, nonfocal.   Data Reviewed: Basic Metabolic Panel:  Recent  Labs Lab 04/24/14 1653 04/25/14 0550 04/26/14 0510  NA 138 142 136*  K 3.4* 3.7 3.0*  CL 99 103 101  CO2 22 24 21   GLUCOSE 68* 132* 135*  BUN 14 11 12   CREATININE 0.69 0.55 0.67  CALCIUM 8.7 9.0 8.9   Liver Function Tests:  Recent Labs Lab 04/24/14 1653 04/25/14 0550  AST 49* 43*  ALT 8 8  ALKPHOS 101 108  BILITOT 0.4 0.3  PROT 5.7* 5.9*  ALBUMIN 2.1* 2.0*   No results for input(s): LIPASE, AMYLASE in the last 168 hours.  Recent Labs Lab 04/24/14 2200  AMMONIA 19   CBC:  Recent Labs Lab 04/24/14 1653 04/25/14 0550 04/25/14 0900 04/26/14 0510  WBC 15.0* 15.9* 15.7* 12.1*  NEUTROABS 12.1*  --  13.2*  --   HGB 8.5* 8.4* 8.4* 7.6*  HCT 26.0* 25.2* 25.3* 23.4*  MCV 91.2 90.3 90.7 91.4  PLT 77* 63* 59* 52*   Cardiac Enzymes:  Recent Labs Lab 04/24/14 2057  TROPONINI <0.30   BNP (last 3 results)  Recent Labs  04/10/14 1537  PROBNP 298.3*   CBG: No results for input(s): GLUCAP in the last 168 hours.  Recent Results (from the past 240 hour(s))  Blood Culture (routine x 2)     Status: None (Preliminary result)   Collection Time: 04/24/14  4:53 PM  Result Value Ref Range Status   Specimen Description BLOOD LEFT HAND  Final   Special Requests BOTTLES DRAWN AEROBIC AND ANAEROBIC 10 ML  Final   Culture  Setup Time   Final    04/24/2014 22:39 Performed at [facility]    Culture   Final           BLOOD CULTURE RECEIVED NO GROWTH TO DATE CULTURE WILL BE HELD FOR 5 DAYS BEFORE ISSUING A FINAL NEGATIVE REPORT Performed at [facility]    Report Status PENDING  Incomplete  Blood Culture (routine x 2)     Status: None (Preliminary result)   Collection Time: 04/24/14  4:53 PM  Result Value Ref Range Status   Specimen Description BLOOD LEFT ANTECUBITAL  Final   Special Requests BOTTLES DRAWN AEROBIC AND ANAEROBIC  5 CC  Final   Culture  Setup Time   Final    04/24/2014 22:39 Performed at [facility]    Culture   Final     [place] IN PAIRS Note: Gram Stain Report Called to,Read Back By and Verified With: DELISA COBB ON 04/25/2014 AT 10:45P BY WILEJ Performed at [facility]    Report Status PENDING  Incomplete  Urine culture     Status: None (Preliminary result)   Collection Time: 04/24/14  5:02 PM  Result Value Ref Range Status   Specimen Description URINE, CLEAN CATCH  Final   Special Requests NONE  Final   Culture  Setup Time   Final    04/24/2014 23:08 Performed at [place] PENDING  Incomplete   Culture   Final    Culture reincubated for better growth Performed at [facility]    Report Status PENDING  Incomplete  Clostridium Difficile by  PCR     Status: None   Collection Time: 04/25/14  9:42 AM  Result Value Ref Range Status   C difficile by pcr NEGATIVE NEGATIVE Final    Comment: Performed at [facility]     Studies: Ct Abdomen Pelvis W Contrast  04/25/2014   CLINICAL DATA:  Chronic right upper quadrant abdominal pain. History of metastatic breast cancer with chemotherapy on-going.  EXAM: CT ABDOMEN AND PELVIS WITH CONTRAST  TECHNIQUE: Multidetector CT imaging of the abdomen and pelvis was performed using the standard protocol following bolus administration of intravenous contrast.  CONTRAST:  29mL OMNIPAQUE IOHEXOL 300 MG/ML  SOLN  COMPARISON:  CT abdomen pelvis 04/10/2014  FINDINGS: Lung bases: Mild atelectasis of both lung bases. Negative for pleural effusion. Stable prominent 10 mm paraesophageal lymph node on image 7. On image number 1 there is an incompletely imaged probable prominent subcarinal lymph node measuring up to 1.2 cm short axis on this image. Left hilar nodal tissue is incompletely imaged, but measures at least 9 mm short axis. Heart size within normal limits.  The liver is normal in size and enhancement. No suspicious hepatic lesions. Very mild intrahepatic biliary ductal dilatation is stable. Patient is status post  cholecystectomy and there is stable chronic prominence of the extrahepatic bile duct measuring up to 15 mm in diameter. There appears to be an abrupt transition from dilated duct to a decompressed distal common bile duct, for which a distal stricture or ampullary lesion cannot be excluded.  The spleen is normal in size and enhancement. Normal adrenal glands. Stable appearance of both kidneys. Negative for renal mass or hydronephrosis. Probable focal scarring in the posterior cortex of the upper pole the left kidney. No visible urinary tract stones. Ureters are decompressed. Urinary bladder has a normal appearance.  Stomach is well distended with oral contrast and air and appears normal. Small bowel loops are normal in caliber and wall thickness. Normal appearance of the terminal ileum. The colon is normal in caliber and contains multiple scattered diverticula, most prominent in the sigmoid region. Appendix not definitely visualized, but no evidence of appendicitis. Rectum unremarkable. Hysterectomy. No adnexal mass.  Negative for pathologically enlarged mesenteric or retroperitoneal lymph nodes. Atherosclerotic calcification is seen scattered and the normal caliber abdominal aorta and iliac vasculature. Retro aortic left renal vein noted. Negative for ascites.  Small subcutaneous locules of gas in the anterior abdominal wall of the left lower quadrant, likely injection sites, were not previously present.  Diffusely mottled appearance of the visualized bones consistent with bony metastatic disease. The imaged vertebral body heights are preserved. No acute fracture is identified in the visualized thoracolumbar spine, imaged lower ribs, or bony pelvis.  IMPRESSION: 1. No acute findings in the abdomen or pelvis compared to recent CT of 04/10/2014. 2. Persist extrahepatic biliary ductal dilatation to 1.5 cm, with fairly abrupt transition to completely decompressed extrahepatic bile duct. Stable very mild intrahepatic  biliary ductal dilatation. The ductal dilatation could be related to the postcholecystectomy state; however, the possibility of a distal ductal stricture or small ampullary of obstructing lesion is difficult to exclude. Correlation with liver function tests is suggested. 3. Diffuse bony metastatic disease M, unchanged. 4. Stable prominent paraesophageal lymph node, and partially imaged probable prominent subcarinal lymph node. 5. Colonic diverticulosis without evidence of acute diverticulitis.   Electronically Signed   By: Curlene Dolphin M.D.   On: 04/25/2014 12:35   Dg Chest Port 1 View  04/24/2014   CLINICAL DATA:  Fever and tachycardia.  EXAM: PORTABLE CHEST - 1 VIEW  COMPARISON:  04/15/2014 as well as chest CT 04/11/2014  FINDINGS: Left subclavian Port-A-Cath is unchanged with tip in the region of the cavoatrial junction. Low lung volumes with mild prominence of the perihilar markings. Stable known right suprahilar mass measuring approximately 3 cm. Possible small amount of bilateral pleural fluid. Cardiomediastinal silhouette and remainder of the exam is unchanged.  IMPRESSION: Low lung volumes with findings suggesting mild vascular congestion. Continued small amount bilateral pleural fluid.  Stable known right suprahilar mass.   Electronically Signed   By: Marin Olp M.D.   On: 04/24/2014 17:33    Scheduled Meds: . amLODipine  2.5 mg Oral Daily  . aztreonam  2 g Intravenous Q8H  . budesonide  0.5 mg Nebulization Q12H  . enoxaparin  80 mg Subcutaneous Q24H  . lidocaine  1 patch Transdermal Q24H  . LORazepam  0.5 mg Oral 3 times per day  . metronidazole  500 mg Intravenous Q8H  . pantoprazole  40 mg Oral Daily  . sodium chloride  3 mL Intravenous Q12H  . vancomycin  500 mg Intravenous Q12H   Continuous Infusions: . sodium chloride 1,000 mL (04/26/14 0314)     Charlynne Cousins  [facility] Pager [phone]. If 8PM-8AM, please contact night-coverage at [email or web address], password  [facility] 04/26/2014, 7:24 AM  LOS: 2 days

## 2014-04-26 NOTE — Progress Notes (Signed)
CSW received referral for unsure of disposition planning and for CSW to follow.  CSW reviewed chart and PT/OT evaluation pending.   CSW to await PT/OT evaluation and assess as appropriate.  Alison Murray, MSW, [place] [employer] [phone]

## 2014-04-26 NOTE — Progress Notes (Signed)
PT Cancellation Note  Patient Details Name: Brynne Doane MRN: [id number] DOB: 1955-11-24   Cancelled Treatment:    Reason Eval/Treat Not Completed: Pain limiting ability to participate (had been to MRI and in a lot of pain, now very drowsy. will check back  tomorrow.)   Claretha Cooper 04/26/2014, 3:20 PM

## 2014-04-26 NOTE — Plan of Care (Signed)
Problem: Phase I Progression Outcomes Goal: Pain controlled with appropriate interventions Outcome: Completed/Met Date Met:  04/26/14 Goal: Hemodynamically stable Outcome: Completed/Met Date Met:  04/26/14

## 2014-04-26 NOTE — Clinical Documentation Improvement (Signed)
Potassium 3.4 on admission 04/24/14; potassium chloride SA 40 mEq once ordered.  Potassium 3.0 on 04/26/14; potassium chloride SA 40 mEq once ordered.  Please identify any clinical conditions associated with the abnormal potassium level and document in your progress note and discharge summary.  Component      Potassium  Latest Ref Rng      3.7 - 5.3 mEq/L  04/24/2014     4:53 PM 3.4 (L)  04/25/2014     5:50 AM 3.7  04/26/2014      3.0 (L)   Possible Clinical Conditions: -Hypokalemia -Other Condition -Unable to determine at present  Thank you, Mateo Flow, RN [phone] Clinical Documentation Specialist

## 2014-04-26 NOTE — Progress Notes (Signed)
PHARMACY BRIEF NOTE - Drug Level Result  Consult for:  Vancomycin Indication:  Sepsis  The current dose of Vancomycin is 500 mg IV every 12 hours.  A trough level drawn prior to the 6 pm dose tonight is reported as 8.1 mcg/ml.  This level is below the therapeutic range, 15-20 mcg/ml.  Plan: Increase the Vancomycin dose to 1000 mg IV every 12 hours.  GardinerPh. 04/26/2014 6:34 PM

## 2014-04-27 LAB — CBC
HCT: 29.1 % — ABNORMAL LOW (ref 36.0–46.0)
HEMOGLOBIN: 9.2 g/dL — AB (ref 12.0–15.0)
MCH: 29.6 pg (ref 26.0–34.0)
MCHC: 31.6 g/dL (ref 30.0–36.0)
MCV: 93.6 fL (ref 78.0–100.0)
PLATELETS: 52 10*3/uL — AB (ref 150–400)
RBC: 3.11 MIL/uL — ABNORMAL LOW (ref 3.87–5.11)
RDW: 20.2 % — ABNORMAL HIGH (ref 11.5–15.5)
WBC: 12.8 10*3/uL — ABNORMAL HIGH (ref 4.0–10.5)

## 2014-04-27 LAB — PROTEIN, TOTAL: Total Protein: 6.4 g/dL (ref 6.0–8.3)

## 2014-04-27 LAB — LACTATE DEHYDROGENASE: LDH: 328 U/L — AB (ref 94–250)

## 2014-04-27 LAB — BASIC METABOLIC PANEL
ANION GAP: 15 (ref 5–15)
BUN: 10 mg/dL (ref 6–23)
CHLORIDE: 101 meq/L (ref 96–112)
CO2: 22 mEq/L (ref 19–32)
Calcium: 9.9 mg/dL (ref 8.4–10.5)
Creatinine, Ser: 0.58 mg/dL (ref 0.50–1.10)
Glucose, Bld: 98 mg/dL (ref 70–99)
POTASSIUM: 4.4 meq/L (ref 3.7–5.3)
Sodium: 138 mEq/L (ref 137–147)

## 2014-04-27 LAB — CULTURE, BLOOD (ROUTINE X 2)

## 2014-04-27 LAB — ALBUMIN: ALBUMIN: 2.1 g/dL — AB (ref 3.5–5.2)

## 2014-04-27 MED ORDER — PROCHLORPERAZINE MALEATE 10 MG PO TABS: 10.0000 mg | ORAL_TABLET | Freq: Four times a day (QID) | ORAL | Status: DC | PRN

## 2014-04-27 MED ORDER — VITAMIN D3 25 MCG (1000 UNIT) PO TABS
2000.0000 [IU] | ORAL_TABLET | Freq: Every day | ORAL | Status: DC
  Administered 2014-04-27 – 2014-05-03 (×7): 2000 [IU] via ORAL
  Filled 2014-04-27 (×7): qty 2

## 2014-04-27 MED ORDER — PIPERACILLIN-TAZOBACTAM 3.375 G IVPB
3.3750 g | Freq: Three times a day (TID) | INTRAVENOUS | Status: DC
  Administered 2014-04-27 – 2014-04-28 (×5): 3.375 g via INTRAVENOUS
  Filled 2014-04-27 (×6): qty 50

## 2014-04-27 MED ORDER — POLYETHYLENE GLYCOL 3350 17 G PO PACK
17.0000 g | PACK | Freq: Two times a day (BID) | ORAL | Status: DC
  Administered 2014-04-28 – 2014-05-02 (×6): 17 g via ORAL
  Filled 2014-04-27 (×14): qty 1

## 2014-04-27 NOTE — Progress Notes (Signed)
Subjective: The patient is feeling better.  Objective: Vital signs in last 24 hours: Temp:  [98.6 F (37 C)-99.7 F (37.6 C)] 98.8 F (37.1 C) (11/12 1500) Pulse Rate:  [83-135] 114 (11/12 1500) Resp:  [13-20] 20 (11/12 1500) BP: (129-150)/(72-77) 139/77 mmHg (11/12 1500) SpO2:  [77 %-98 %] 92 % (11/12 1500) Last BM Date: 04/24/14  Intake/Output from previous day: 11/11 0701 - 11/12 0700 In: 4373.1 [P.O.:150; I.V.:3873.1; IV Piggyback:350] Out: 3350 [Urine:3350] Intake/Output this shift: Total I/O In: -  Out: 1300 [Urine:1300]  General appearance: alert and no distress GI: soft, non-tender; bowel sounds normal; no masses,  no organomegaly  Lab Results:  Recent Labs  04/25/14 0900 04/26/14 0510 04/27/14 0520  WBC 15.7* 12.1* 12.8*  HGB 8.4* 7.6* 9.2*  HCT 25.3* 23.4* 29.1*  PLT 59* 52* 52*   BMET  Recent Labs  04/25/14 0550 04/26/14 0510 04/27/14 0520  NA 142 136* 138  K 3.7 3.0* 4.4  CL 103 101 101  CO2 24 21 22   GLUCOSE 132* 135* 98  BUN 11 12 10   CREATININE 0.55 0.67 0.58  CALCIUM 9.0 8.9 9.9   LFT  Recent Labs  04/25/14 0550  PROT 5.9*  ALBUMIN 2.0*  AST 43*  ALT 8  ALKPHOS 108  BILITOT 0.3   PT/INR  Recent Labs  04/24/14 2057 04/25/14 0900  LABPROT 15.4* 15.9*  INR 1.21 1.26   Hepatitis Panel No results for input(s): HEPBSAG, HCVAB, HEPAIGM, HEPBIGM in the last 72 hours. C-Diff No results for input(s): CDIFFTOX in the last 72 hours. Fecal Lactopherrin No results for input(s): FECLLACTOFRN in the last 72 hours.  Studies/Results: Mr Abdomen Mrcp Wo Cm  04/26/2014   CLINICAL DATA:  Severe right upper quadrant pain, biliary ductal dilatation on CT, history of metastatic breast cancer, chemotherapy ongoing  EXAM: MRI ABDOMEN WITHOUT CONTRAST  (INCLUDING MRCP)  TECHNIQUE: Multiplanar multisequence MR imaging of the abdomen was performed. Heavily T2-weighted images of the biliary and pancreatic ducts were obtained, and  three-dimensional MRCP images were rendered by post processing.  COMPARISON:  CT abdomen pelvis dated 04/25/2014  FINDINGS: The examination had to be discontinued prior to completion due to patient discomfort.  Motion degraded images.  Trace bilateral pleural effusions with associated compressive atelectasis at the lung bases.  Liver is unremarkable.  No focal hepatic lesions.  Status post cholecystectomy. No intrahepatic ductal dilatation. Dilated common duct, measuring up to 13 mm, and abruptly tapering at the ampulla. No choledocholithiasis is seen.  Pancreas is grossly unremarkable. No dilatation of the main pancreatic duct.  Spleen and adrenal glands are grossly unremarkable.  Kidneys are within normal limits.  No hydronephrosis.  Small left para-aortic nodes measuring up to 8 mm short axis.  No abdominal ascites.  Body wall edema.  Soft tissue mass/inflammatory changes involving the right breast (series 3/image 7), incompletely visualized, likely corresponding to known right breast cancer.  IMPRESSION: Study had to be discontinued prior to completion due to patient discomfort. Motion degraded images.  Status post cholecystectomy. No intrahepatic ductal dilatation. Dilated common duct, measuring up to 13 mm, an abruptly tapering at the ampulla. No choledocholithiasis is seen.  In the setting of essentially normal LFTs, this appearance is favored to be postsurgical. Less likely, this could reflect a distal CBD stricture. If there is continued clinical concern, consider ERCP.   Electronically Signed   By: Julian Hy M.D.   On: 04/26/2014 14:15   Mr 3d Recon At Scanner  04/26/2014  CLINICAL DATA:  Severe right upper quadrant pain, biliary ductal dilatation on CT, history of metastatic breast cancer, chemotherapy ongoing  EXAM: MRI ABDOMEN WITHOUT CONTRAST  (INCLUDING MRCP)  TECHNIQUE: Multiplanar multisequence MR imaging of the abdomen was performed. Heavily T2-weighted images of the biliary and  pancreatic ducts were obtained, and three-dimensional MRCP images were rendered by post processing.  COMPARISON:  CT abdomen pelvis dated 04/25/2014  FINDINGS: The examination had to be discontinued prior to completion due to patient discomfort.  Motion degraded images.  Trace bilateral pleural effusions with associated compressive atelectasis at the lung bases.  Liver is unremarkable.  No focal hepatic lesions.  Status post cholecystectomy. No intrahepatic ductal dilatation. Dilated common duct, measuring up to 13 mm, and abruptly tapering at the ampulla. No choledocholithiasis is seen.  Pancreas is grossly unremarkable. No dilatation of the main pancreatic duct.  Spleen and adrenal glands are grossly unremarkable.  Kidneys are within normal limits.  No hydronephrosis.  Small left para-aortic nodes measuring up to 8 mm short axis.  No abdominal ascites.  Body wall edema.  Soft tissue mass/inflammatory changes involving the right breast (series 3/image 7), incompletely visualized, likely corresponding to known right breast cancer.  IMPRESSION: Study had to be discontinued prior to completion due to patient discomfort. Motion degraded images.  Status post cholecystectomy. No intrahepatic ductal dilatation. Dilated common duct, measuring up to 13 mm, an abruptly tapering at the ampulla. No choledocholithiasis is seen.  In the setting of essentially normal LFTs, this appearance is favored to be postsurgical. Less likely, this could reflect a distal CBD stricture. If there is continued clinical concern, consider ERCP.   Electronically Signed   By: Julian Hy M.D.   On: 04/26/2014 14:15    Medications:  Scheduled: . amLODipine  2.5 mg Oral Daily  . budesonide  0.5 mg Nebulization Q12H  . cholecalciferol  2,000 Units Oral Daily  . enoxaparin  80 mg Subcutaneous Q24H  . lidocaine  1 patch Transdermal Q24H  . LORazepam  0.5 mg Oral 3 times per day  . pantoprazole  40 mg Oral Daily  .  piperacillin-tazobactam (ZOSYN)  IV  3.375 g Intravenous Q8H  . polyethylene glycol  17 g Oral BID  . sodium chloride  3 mL Intravenous Q12H  . vancomycin  1,000 mg Intravenous Q12H   Continuous: . sodium chloride 75 mL/hr at 04/27/14 0516    Assessment/Plan: 1) Dilated CBD.   I reviewed the CT scan, MRCP, and her liver enzymes.  I concur that the CBD dilation is post-surgical.  Her liver enzymes are normal and she feels well at this time.  No ERCP.  Plan: 1) Continue with supportive care. 2) Signing off.   LOS: 3 days   Curtistine Pettitt D 04/27/2014, 4:39 PM

## 2014-04-27 NOTE — Progress Notes (Signed)
Occupational Therapy Treatment Patient Details Name: [name] MRN: [id number] DOB: December 11, 1955 Today's Date: 04/27/2014    History of present illness Pt with recent admission from 10/26-11/4 for Sepsis, encephalopathy, and pneumonia/respiratory failure. Admitted 04/24/14 for diarrhea and fever.    OT comments  Pt requiring min assist and increased time to transfer to toilet with walker and back to bed. Pt's HR sitting on commode was in 130s and O2 on 2L down to 77% but increased to upper 90s with rest and PLB. HR down to 116 with rest. Pt having much more difficulty with transfers and safely lowering herself onto the commode. Discussed SNF option with pt and spoke to social worker who states family would like to pursue SNF. Feel pt will benefit from SNF based on this session.   Follow Up Recommendations  SNF;Supervision/Assistance - 24 hour    Equipment Recommendations  None recommended by OT    Recommendations for Other Services      Precautions / Restrictions Precautions Precautions: Fall Precaution Comments: requires O2/monitor sats       Mobility Bed Mobility   Bed Mobility: Supine to Sit     Supine to sit: HOB elevated;Min guard     General bed mobility comments: increased time.  Transfers Overall transfer level: Needs assistance Equipment used: Rolling walker (2 wheeled) Transfers: Sit to/from Stand Sit to Stand: Min assist         General transfer comment: pt required min assist to ascend from commode and EOB and stabilize. min assist to safely descend to toilet. Pt with difficulty lowering herself to commode. verbal cues hand placement.     Balance                                   ADL                           Toilet Transfer: Minimal assistance;Ambulation;Comfort height toilet;Grab bars   Toileting- Clothing Manipulation and Hygiene: Minimal assistance;Sit to/from stand         General ADL Comments: Pt requires  increased time today for OOB and noted pt SOB with activity 3/4 dyspnea. Encouraged PLB during activity. Pt struggled to sit on commode due to weakness and needing min assist to guide her to siitting. On commode HR 130s and O2 on 2L down to 77% but up to 98% with rest and PLB. HR decreased to 116 with rest. Nursing made aware. Pt reports being fatigued after toilet transfer and requested to return to bed. Discussed pt would benefit from SNF at d/c and spoke to social worker who states family would like SNF.       Vision                     Perception     Praxis      Cognition   Behavior During Therapy: WFL for tasks assessed/performed Overall Cognitive Status: Within Functional Limits for tasks assessed                       Extremity/Trunk Assessment               Exercises     Shoulder Instructions       General Comments      Pertinent Vitals/ Pain       Pain Assessment: No/denies pain  Home  Living                                          Prior Functioning/Environment              Frequency Min 2X/week     Progress Toward Goals  OT Goals(current goals can now be found in the care plan section)  Progress towards OT goals: Not progressing toward goals - comment (pt with SOB today with activity and weaker)     Plan Discharge plan needs to be updated    Co-evaluation                 End of Session Equipment Utilized During Treatment: Rolling walker;Oxygen   Activity Tolerance Patient limited by fatigue;Other (comment) (decreased O2 and increased HR with activity)   Patient Left in bed;with call bell/phone within reach;with bed alarm set   Nurse Communication          Time: 1105-1130 OT Time Calculation (min): 25 min  Charges: OT General Charges $OT Visit: 1 Procedure OT Treatments $Self Care/Home Management : 8-22 mins $Therapeutic Activity: 8-22 mins  Jules Schick   [phone] 04/27/2014, 11:41 AM

## 2014-04-27 NOTE — Progress Notes (Addendum)
[facility] PROGRESS NOTE Interim History: [name] is a 58 y.o. Female with metastatic breast cancer followed by Dr. Jana Hakim, last chemo on 03/27/14, COPD and hypertension who was admitted to [facility] from 10/26 through 11/4 for sepsis due to pneumonia and COPD exacerbation. Discharged on steroids and levofloxacin. She develop RUQ pain but developed recurrent high fevers with diarrhea (acute on chronic). On the night of admission and discovered that she was confused so they had her brought back to the ER. Was found to be septic without a source of infection.C. Diff was negative. Her temperature has clearly trended down on IV antibiotics, however.   Assessment/Plan: Sepsis of unclear Source: - CT abd/pelvis as below, Stool culture negative, Cdiff PCR negative. lactic acid with in normal. - MRCP  abruptly tapering at the ampulla. No choledocholithiasis is seen. Gi consulted they do feels this is unlikely she will benefit from ERCP. - She has defervesce. Pt cont to complains of spasm on her back. Ct psine - Continue Vanc, Aztreonam, change antibiotics to zosyn. Will con treatment for HCAP. Will ask IR to perform thoracocentesis. - [place] x2 negative till date,  UC negative - Bp stable tachycardic, no diarrhea.  B/L pleural effusion: - Consult IR to drain and send for cultures. - she cont to have pleuritic type pain. - send fluid for cell count, protein and LDH. Also PH.  Acute encephalopathy: - resolved. - Mild intermittent confusion today. 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 Compliant on lovenox at home  COPD On [address] at home, - No current respiratory distress - Continue home pulmicort and xopenex - One time solumedrol 125mg  - O2 [place]   Chronic RUQ pain and MSK pain:  - At baseline per pt -continue percocet and flexeril -Add lidocaine patch to left chest wall  HTN:  - Normotensive - Continue Norvasc    Leukocytosis: - Slightly worse today, however, received steroids yesterday -Continue to trend WBC  Normocytic anemia: - Due to chemo therapy. -Approxiamtely stable from yesterday.  Thrombocytopenia: - Now stable ? Due to chemo. - INR <1.5, fibrinogen > 600, smear for schistocytes - Trend plts  Code Status: full Family Communication: patient alone Disposition Plan: Pending further evaluation for source of sepsis   Consultants:  GI  Procedures: Ct abd and pelvis 11.11.2015: Persist extrahepatic biliary ductal dilatation to 1.5 cm, with fairly abrupt transition to completely decompressed extrahepatic bile duct. Stable very mild intrahepatic biliary ductal dilatation. The ductal dilatation could be related to the postcholecystectomy state; however, the possibility of a distal ductal stricture or small ampullary of obstructing lesion is difficult to exclude. Correlation with liver function tests is suggested. . Diffuse bony metastatic disease M, unchanged. Stable prominent paraesophageal lymph node, and partially imaged probable prominent subcarinal lymph node.  Antibiotics:  Flagyl, Vanc and aztronam 11.12.2015  HPI/Subjective: Diffuse pain.  Objective: Filed Vitals:   04/26/14 2046 04/27/14 0510 04/27/14 1029 04/27/14 1125  BP: 129/72 150/74    Pulse: 86 100  135  Temp: 98.6 F (37 C) 99.7 F (37.6 C)    TempSrc: Oral Oral    Resp: 16 18    Height:      Weight:      SpO2: 98% 95% 98% 77%    Intake/Output Summary (Last 24 hours) at 04/27/14 1342 Last data filed at 04/27/14 0936  Gross per 24 hour  Intake 3948.1 ml  Output   2650 ml  Net 1298.1 ml  Filed Weights   04/24/14 1625 04/24/14 2053 04/26/14 0400  Weight: 52.164 kg (115 lb) 54.5 kg (120 lb 2.4 oz) 54 kg (119 lb 0.8 oz)    Exam:  General: Alert, awake, oriented x3, in no acute distress.  HEENT: No bruits, no goiter.  Heart: Regular rate and rhythm. Lungs: Good air movement, bilateral air  movement.  Abdomen: Soft, RUQ tender, positive bowel sounds.  Neuro: Grossly intact, nonfocal.   Data Reviewed: Basic Metabolic Panel:  Recent Labs Lab 04/24/14 1653 04/25/14 0550 04/26/14 0510 04/27/14 0520  NA 138 142 136* 138  K 3.4* 3.7 3.0* 4.4  CL 99 103 101 101  CO2 22 24 21 22   GLUCOSE 68* 132* 135* 98  BUN 14 11 12 10   CREATININE 0.69 0.55 0.67 0.58  CALCIUM 8.7 9.0 8.9 9.9   Liver Function Tests:  Recent Labs Lab 04/24/14 1653 04/25/14 0550  AST 49* 43*  ALT 8 8  ALKPHOS 101 108  BILITOT 0.4 0.3  PROT 5.7* 5.9*  ALBUMIN 2.1* 2.0*   No results for input(s): LIPASE, AMYLASE in the last 168 hours.  Recent Labs Lab 04/24/14 2200  AMMONIA 19   CBC:  Recent Labs Lab 04/24/14 1653 04/25/14 0550 04/25/14 0900 04/26/14 0510 04/27/14 0520  WBC 15.0* 15.9* 15.7* 12.1* 12.8*  NEUTROABS 12.1*  --  13.2*  --   --   HGB 8.5* 8.4* 8.4* 7.6* 9.2*  HCT 26.0* 25.2* 25.3* 23.4* 29.1*  MCV 91.2 90.3 90.7 91.4 93.6  PLT 77* 63* 59* 52* 52*   Cardiac Enzymes:  Recent Labs Lab 04/24/14 2057  TROPONINI <0.30   BNP (last 3 results)  Recent Labs  04/10/14 1537  PROBNP 298.3*   CBG: No results for input(s): GLUCAP in the last 168 hours.  Recent Results (from the past 240 hour(s))  Blood Culture (routine x 2)     Status: None (Preliminary result)   Collection Time: 04/24/14  4:53 PM  Result Value Ref Range Status   Specimen Description BLOOD LEFT HAND  Final   Special Requests BOTTLES DRAWN AEROBIC AND ANAEROBIC 10 ML  Final   Culture  Setup Time   Final    04/24/2014 22:39 Performed at [facility]    Culture   Final           BLOOD CULTURE RECEIVED NO GROWTH TO DATE CULTURE WILL BE HELD FOR 5 DAYS BEFORE ISSUING A FINAL NEGATIVE REPORT Performed at [facility]    Report Status PENDING  Incomplete  Blood Culture (routine x 2)     Status: None   Collection Time: 04/24/14  4:53 PM  Result Value Ref Range Status   Specimen  Description BLOOD LEFT ANTECUBITAL  Final   Special Requests BOTTLES DRAWN AEROBIC AND ANAEROBIC  5 CC  Final   Culture  Setup Time   Final    04/24/2014 22:39 Performed at [facility]    Culture   Final    STAPHYLOCOCCUS SPECIES (COAGULASE NEGATIVE) Note: THE SIGNIFICANCE OF ISOLATING THIS ORGANISM FROM A SINGLE SET OF BLOOD CULTURES WHEN MULTIPLE SETS ARE DRAWN IS UNCERTAIN. PLEASE NOTIFY THE MICROBIOLOGY DEPARTMENT WITHIN ONE WEEK IF SPECIATION AND SENSITIVITIES ARE REQUIRED. Note: Gram Stain Report Called to,Read Back By and Verified With: DELISA COBB ON 04/25/2014 AT 10:45P BY Dennard Nip Performed at [facility]    Report Status 04/27/2014 FINAL  Final  Urine culture     Status: None (Preliminary result)   Collection Time:  04/24/14  5:02 PM  Result Value Ref Range Status   Specimen Description URINE, CLEAN CATCH  Final   Special Requests NONE  Final   Culture  Setup Time   Final    04/24/2014 23:08 Performed at [place]   Final    30,000 COLONIES/ML Performed at [facility]    Culture   Final    [place] Performed at [facility]    Report Status PENDING  Incomplete  Clostridium Difficile by PCR     Status: None   Collection Time: 04/25/14  9:42 AM  Result Value Ref Range Status   C difficile by pcr NEGATIVE NEGATIVE Final    Comment: Performed at [facility]  Stool culture     Status: None (Preliminary result)   Collection Time: 04/25/14  9:42 AM  Result Value Ref Range Status   Specimen Description STOOL  Final   Special Requests NONE  Final   Culture   Final    NO SUSPICIOUS COLONIES, CONTINUING TO HOLD Performed at [facility]    Report Status PENDING  Incomplete     Studies: Mr Abdomen Mrcp Wo Cm  04/26/2014   CLINICAL DATA:  Severe right upper quadrant pain, biliary ductal dilatation on CT, history of metastatic breast cancer, chemotherapy ongoing  EXAM: MRI ABDOMEN  WITHOUT CONTRAST  (INCLUDING MRCP)  TECHNIQUE: Multiplanar multisequence MR imaging of the abdomen was performed. Heavily T2-weighted images of the biliary and pancreatic ducts were obtained, and three-dimensional MRCP images were rendered by post processing.  COMPARISON:  CT abdomen pelvis dated 04/25/2014  FINDINGS: The examination had to be discontinued prior to completion due to patient discomfort.  Motion degraded images.  Trace bilateral pleural effusions with associated compressive atelectasis at the lung bases.  Liver is unremarkable.  No focal hepatic lesions.  Status post cholecystectomy. No intrahepatic ductal dilatation. Dilated common duct, measuring up to 13 mm, and abruptly tapering at the ampulla. No choledocholithiasis is seen.  Pancreas is grossly unremarkable. No dilatation of the main pancreatic duct.  Spleen and adrenal glands are grossly unremarkable.  Kidneys are within normal limits.  No hydronephrosis.  Small left para-aortic nodes measuring up to 8 mm short axis.  No abdominal ascites.  Body wall edema.  Soft tissue mass/inflammatory changes involving the right breast (series 3/image 7), incompletely visualized, likely corresponding to known right breast cancer.  IMPRESSION: Study had to be discontinued prior to completion due to patient discomfort. Motion degraded images.  Status post cholecystectomy. No intrahepatic ductal dilatation. Dilated common duct, measuring up to 13 mm, an abruptly tapering at the ampulla. No choledocholithiasis is seen.  In the setting of essentially normal LFTs, this appearance is favored to be postsurgical. Less likely, this could reflect a distal CBD stricture. If there is continued clinical concern, consider ERCP.   Electronically Signed   By: Julian Hy M.D.   On: 04/26/2014 14:15   Mr 3d Recon At Scanner  04/26/2014   CLINICAL DATA:  Severe right upper quadrant pain, biliary ductal dilatation on CT, history of metastatic breast cancer,  chemotherapy ongoing  EXAM: MRI ABDOMEN WITHOUT CONTRAST  (INCLUDING MRCP)  TECHNIQUE: Multiplanar multisequence MR imaging of the abdomen was performed. Heavily T2-weighted images of the biliary and pancreatic ducts were obtained, and three-dimensional MRCP images were rendered by post processing.  COMPARISON:  CT abdomen pelvis dated 04/25/2014  FINDINGS: The examination had to be discontinued prior to  completion due to patient discomfort.  Motion degraded images.  Trace bilateral pleural effusions with associated compressive atelectasis at the lung bases.  Liver is unremarkable.  No focal hepatic lesions.  Status post cholecystectomy. No intrahepatic ductal dilatation. Dilated common duct, measuring up to 13 mm, and abruptly tapering at the ampulla. No choledocholithiasis is seen.  Pancreas is grossly unremarkable. No dilatation of the main pancreatic duct.  Spleen and adrenal glands are grossly unremarkable.  Kidneys are within normal limits.  No hydronephrosis.  Small left para-aortic nodes measuring up to 8 mm short axis.  No abdominal ascites.  Body wall edema.  Soft tissue mass/inflammatory changes involving the right breast (series 3/image 7), incompletely visualized, likely corresponding to known right breast cancer.  IMPRESSION: Study had to be discontinued prior to completion due to patient discomfort. Motion degraded images.  Status post cholecystectomy. No intrahepatic ductal dilatation. Dilated common duct, measuring up to 13 mm, an abruptly tapering at the ampulla. No choledocholithiasis is seen.  In the setting of essentially normal LFTs, this appearance is favored to be postsurgical. Less likely, this could reflect a distal CBD stricture. If there is continued clinical concern, consider ERCP.   Electronically Signed   By: Julian Hy M.D.   On: 04/26/2014 14:15    Scheduled Meds: . amLODipine  2.5 mg Oral Daily  . aztreonam  2 g Intravenous Q8H  . budesonide  0.5 mg Nebulization Q12H   . cholecalciferol  2,000 Units Oral Daily  . enoxaparin  80 mg Subcutaneous Q24H  . lidocaine  1 patch Transdermal Q24H  . LORazepam  0.5 mg Oral 3 times per day  . metronidazole  500 mg Intravenous Q8H  . pantoprazole  40 mg Oral Daily  . polyethylene glycol  17 g Oral BID  . sodium chloride  3 mL Intravenous Q12H  . vancomycin  1,000 mg Intravenous Q12H   Continuous Infusions: . sodium chloride 75 mL/hr at 04/27/14 0516     Charlynne Cousins  [facility] Pager [phone]. If 8PM-8AM, please contact night-coverage at [email or web address], password [facility] 04/27/2014, 1:42 PM  LOS: 3 days

## 2014-04-27 NOTE — Evaluation (Signed)
Physical Therapy Evaluation Patient Details Name: [name] MRN: [id number] DOB: 1955-12-15 Today's Date: 04/27/2014   History of Present Illness  Pt with recent admission from 10/26-11/4 for Sepsis, encephalopathy, and pneumonia/respiratory failure. Admitted 04/24/14 for diarrhea and fever.   Clinical Impression  On eval, pt required Min assist for mobility-only able to tolerate short distance this session ~15 feet with RW. Pt is deconditioned. All mobility appears effortful. O2 sats dropped to 89% on 2L with activity. At this time, recommend SNF for continued rehab.     Follow Up Recommendations SNF;Supervision/Assistance - 24 hour    Equipment Recommendations  None recommended by PT    Recommendations for Other Services OT consult     Precautions / Restrictions Precautions Precautions: Fall Precaution Comments: requires O2/monitor sats Restrictions Weight Bearing Restrictions: No      Mobility  Bed Mobility Overal bed mobility: Needs Assistance Bed Mobility: Supine to Sit;Sit to Supine     Supine to sit: Min guard;HOB elevated Sit to supine: Min guard;HOB elevated   General bed mobility comments: Increased time.   Transfers Overall transfer level: Needs assistance Equipment used: Rolling walker (2 wheeled) Transfers: Sit to/from Stand Sit to Stand: Min assist         General transfer comment: Assist to rise, stabiilze, control descent.   Ambulation/Gait Ambulation/Gait assistance: Min assist Ambulation Distance (Feet): 15 Feet Assistive device: Rolling walker (2 wheeled) Gait Pattern/deviations: Step-through pattern;Decreased stride length     General Gait Details: very slow gait speed. ambulation is effortful for pt. fatigues very easily. O2 sats 89% on 2L with activity.   Stairs            Wheelchair Mobility    Modified Rankin (Stroke Patients Only)       Balance                                              Pertinent Vitals/Pain Pain Assessment: Faces Faces Pain Scale: Hurts little more Pain Location: R side trunk with activity Pain Intervention(s): Monitored during session    Home Living Family/patient expects to be discharged to:: Skilled nursing facility Living Arrangements: Alone Available Help at Discharge: Family Type of Home: Apartment Home Access: Stairs to enter Entrance Stairs-Rails: None Entrance Stairs-Number of Steps: 4 Home Layout: One level;Full bath on main level Home Equipment: Cane - single point;Walker - 2 wheels;Bedside commode Additional Comments: obtained walker and BSC this past admission    Prior Function           Comments: was using walker to get around. Reports she was able to perform ADL and toileting and family nearby.     Hand Dominance        Extremity/Trunk Assessment   Upper Extremity Assessment: Defer to OT evaluation           Lower Extremity Assessment: Generalized weakness      Cervical / Trunk Assessment: Normal  Communication   Communication: No difficulties  Cognition Arousal/Alertness: Awake/alert Behavior During Therapy: WFL for tasks assessed/performed Overall Cognitive Status: Within Functional Limits for tasks assessed                      General Comments      Exercises        Assessment/Plan    PT Assessment Patient needs continued PT services  PT Diagnosis Difficulty walking;Generalized  weakness   PT Problem List Decreased strength;Decreased range of motion;Decreased activity tolerance;Decreased balance;Decreased mobility;Decreased knowledge of use of DME;Pain;Cardiopulmonary status limiting activity  PT Treatment Interventions DME instruction;Gait training;Functional mobility training;Therapeutic activities;Therapeutic exercise;Patient/family education;Balance training   PT Goals (Current goals can be found in the Care Plan section) Acute Rehab PT Goals Patient Stated Goal: to get  better PT Goal Formulation: With patient Time For Goal Achievement: 05/11/14 Potential to Achieve Goals: Fair    Frequency Min 3X/week   Barriers to discharge        Co-evaluation               End of Session Equipment Utilized During Treatment: Gait belt;Oxygen Activity Tolerance: Patient limited by fatigue Patient left: in bed;with call bell/phone within reach;with bed alarm set           Time: 1624-4695 PT Time Calculation (min) (ACUTE ONLY): 25 min   Charges:   PT Evaluation $Initial PT Evaluation Tier I: 1 Procedure PT Treatments $Gait Training: 8-22 mins $Therapeutic Activity: 8-22 mins   PT G Codes:          Weston Anna, MPT Pager: [phone]

## 2014-04-27 NOTE — Progress Notes (Signed)
Patient says that she does not wear CPAP.

## 2014-04-27 NOTE — Progress Notes (Signed)
Clinical Social Work Department BRIEF PSYCHOSOCIAL ASSESSMENT 04/27/2014  Patient:  [name], [name]     Account Number:  [id number]     Admit date:  04/24/2014  Clinical Social Worker:  Maryln Manuel  Date/Time:  04/27/2014 02:29 PM  Referred by:  Physician  Date Referred:  04/27/2014 Referred for  SNF Placement   Other Referral:   Interview type:  Patient Other interview type:   and patient daughter via telephone    PSYCHOSOCIAL DATA Living Status:  ALONE Admitted from facility:   Level of care:   Primary support name:  [name]/[phone] Primary support relationship to patient:  CHILD, ADULT Degree of support available:   adequate    CURRENT CONCERNS Current Concerns  Post-Acute Placement   Other Concerns:    SOCIAL WORK ASSESSMENT / PLAN CSW received referral stating unsure of disposition planning. PT has not yet evaluated, but OT recommending SNF.    CSW and RNCM met with pt at bedside. Introduced selves and explained roles. Pt discussed that she lived at home and had home health prior to admission, but cannot recall name of agency. CSW discussed that if recommendation for SNF at discharge if pt would be agreeable and pt states that she would. Pt shared that she feels it would be best to discuss in detail with pt daughter, Diana Mackie. RNCM and CSW spoke with pt daughter, Diana Mackie via telephone. Pt daughter discussed that pt has Medicaid and has been approved disability which pt will not begin receiving disability checks until January 2016. Pt daughter discussed that pt lives alone and pt daughter feels that SNF will be best option for pt. CSW discussed that CSW can explore options, but CSW cannot guarantee a SNF local due to pt Medicaid only status and not yet receiving disability check. Pt daughter agreeable to exploring options.    CSW completed FL2 and initiated SNF search to [place], [place], [place], and [facility].    CSW to follow up with pt  and pt daughter regarding disposition planning    CSW to continue to follow to provide support and assist with pt discharge planning needs.   Assessment/plan status:  Psychosocial Support/Ongoing Assessment of Needs Other assessment/ plan:   discharge planning   Information/referral to community resources:   [place], [place], and [place] SNF search    PATIENT'S/FAMILY'S RESPONSE TO PLAN OF CARE: Pt alert and oriented x 4. Pt agreeable to SNF, but deferrring further detailed decisions to pt daughter. Pt daughter hopeful for SNF option, but recogonizes limitations given pt insurance.    Alison Murray, MSW, [place] Work [phone]

## 2014-04-27 NOTE — Progress Notes (Signed)
Clinical Social Work Department CLINICAL SOCIAL WORK PLACEMENT NOTE 04/27/2014  Patient:  [name], [name]  Account Number:  [id number] Admit date:  04/24/2014  Clinical Social Worker:  Maryln Manuel  Date/time:  04/27/2014 02:37 PM  Clinical Social Work is seeking post-discharge placement for this patient at the following level of care:   SKILLED NURSING   (*CSW will update this form in Epic as items are completed)   04/27/2014  Patient/family provided with [place] Department of Clinical Social Work's list of facilities offering this level of care within the geographic area requested by the patient (or if unable, by the patient's family).  04/27/2014  Patient/family informed of their freedom to choose among providers that offer the needed level of care, that participate in Medicare, Medicaid or managed care program needed by the patient, have an available bed and are willing to accept the patient.  04/27/2014  Patient/family informed of [facility]' ownership interest in [facility], as well as of the fact that they are under no obligation to receive care at this facility.  PASARR submitted to EDS on 04/27/2014 PASARR number received on 04/27/2014  FL2 transmitted to all facilities in geographic area requested by pt/family on  04/27/2014 FL2 transmitted to all facilities within larger geographic area on   Patient informed that his/her managed care company has contracts with or will negotiate with  certain facilities, including the following:     Patient/family informed of bed offers received:   Patient chooses bed at  Physician recommends and patient chooses bed at    Patient to be transferred to  on   Patient to be transferred to facility by  Patient and family notified of transfer on  Name of family member notified:    The following physician request were entered in Epic:   Additional Comments:   Alison Murray, MSW, [place]  Work [phone]

## 2014-04-27 NOTE — Progress Notes (Signed)
ANTIBIOTIC CONSULT NOTE - FOLLOW UP  Pharmacy Consult for Zosyn, vancomycin Indication: intra-abdominal infection, r/o HCAP  Allergies  Allergen Reactions  . Penicillins Other (See Comments)    Reaction a long time ago    Patient Measurements: Height: 4\' 11"  (149.9 cm) Weight: 119 lb 0.8 oz (54 kg) IBW/kg (Calculated) : 43.2  Assessment: 49 yoF [facility] ED on 11/9 with fever and tachycardia.  PMH includes metastatic breast cancer (last chemo given on 10/5), COPD and HTN.  She was recently admitted (10/26-11/4) with sepsis and pneumonia; she completed 7 days of vanc/cefepime then transitioned to oral Levaquin.  Pharmacy originally consulted to dose vancomycin, levaquin, and aztreonam. Now antibiotics being changed to Zosyn and vancomycin  Patient with a Hx penicillin allergy, MD wants to continue with Zosyn. Notified RN to watch for allergic reactions  Antiinfectives 11/9 >> Vanc >> 11/9 >> aztreonam >> 11/12 11/9 >> levaquin >> 11/9 11/9 >> metronidazole >> 11/12 11/12 >> Zosyn >>  Labs / vitals Tmax: 100.8 WBCs:  12.8- stable (PTA prednisone taper 11/4-11/10, no steroids currently) Renal: SCr 0.6, CrCl ~ 56 ml/min CG (low wt), 86 ml/min normalized Lactic acid: 1.46  Microbiology 11/9 blood x2: 1/2 CoNS, pending 11/9 urine: 30K GPC 11/9 cdiff: neg 11/10 stool: no suspicious colonies, pending  Drug level / dose changes info: 11/11 VT 1720 = 8.1 on vancomycin 500mg  Q12H - increase to 1000 mg q12h   Goal of Therapy:  Vancomycin trough level 15-20 mcg/ml Zosyn per indication and renal function  Plan:  - stop aztreonam  - start Zosyn 3.375G IV q8h, each dose to be infused over 4 hours - continue vancomycin 1g IV q12h - vancomycin trough at steady state - follow-up clinical course, culture results, renal function - follow-up antibiotic de-escalation and length of therapy - monitor for allergy to Zosyn  Thank you for the consult.  Currie Paris, PharmD,  BCPS Pager: [phone] Pharmacy: [phone] 04/27/2014 3:07 PM

## 2014-04-28 ENCOUNTER — Other Ambulatory Visit: Payer: Self-pay | Admitting: *Deleted

## 2014-04-28 ENCOUNTER — Ambulatory Visit (HOSPITAL_COMMUNITY): Payer: Medicaid Other

## 2014-04-28 LAB — COMPREHENSIVE METABOLIC PANEL
ALBUMIN: 2 g/dL — AB (ref 3.5–5.2)
ALT: 7 U/L (ref 0–35)
AST: 37 U/L (ref 0–37)
Alkaline Phosphatase: 94 U/L (ref 39–117)
Anion gap: 16 — ABNORMAL HIGH (ref 5–15)
BUN: 7 mg/dL (ref 6–23)
CO2: 24 mEq/L (ref 19–32)
CREATININE: 0.57 mg/dL (ref 0.50–1.10)
Calcium: 9.7 mg/dL (ref 8.4–10.5)
Chloride: 95 mEq/L — ABNORMAL LOW (ref 96–112)
GFR calc Af Amer: >90 mL/min (ref >90)
GFR calc non Af Amer: >90 mL/min (ref >90)
Glucose, Bld: 105 mg/dL — ABNORMAL HIGH (ref 70–99)
Potassium: 3.7 mEq/L (ref 3.7–5.3)
SODIUM: 135 meq/L — AB (ref 137–147)
TOTAL PROTEIN: 6.1 g/dL (ref 6.0–8.3)
Total Bilirubin: 0.3 mg/dL (ref 0.3–1.2)

## 2014-04-28 LAB — CBC
HCT: 25.7 % — ABNORMAL LOW (ref 36.0–46.0)
Hemoglobin: 8.5 g/dL — ABNORMAL LOW (ref 12.0–15.0)
MCH: 30 pg (ref 26.0–34.0)
MCHC: 33.1 g/dL (ref 30.0–36.0)
MCV: 90.8 fL (ref 78.0–100.0)
PLATELETS: 41 10*3/uL — AB (ref 150–400)
RBC: 2.83 MIL/uL — ABNORMAL LOW (ref 3.87–5.11)
RDW: 19.9 % — AB (ref 11.5–15.5)
WBC: 12.2 10*3/uL — AB (ref 4.0–10.5)

## 2014-04-28 LAB — URINE CULTURE

## 2014-04-28 LAB — MISCELLANEOUS TEST

## 2014-04-28 MED ORDER — IOHEXOL 300 MG/ML  SOLN
80.0000 mL | Freq: Once | INTRAMUSCULAR | Status: AC | PRN
  Administered 2014-04-28: 80 mL via INTRAVENOUS

## 2014-04-28 MED ORDER — SODIUM CHLORIDE 0.9 % IV BOLUS (SEPSIS)
500.0000 mL | Freq: Once | INTRAVENOUS | Status: AC
  Administered 2014-04-28: 500 mL via INTRAVENOUS

## 2014-04-28 MED ORDER — OXYCODONE-ACETAMINOPHEN 5-325 MG PO TABS
2.0000 | ORAL_TABLET | ORAL | Status: DC | PRN
Start: 2014-04-28 — End: 2014-05-03
  Administered 2014-04-29 – 2014-05-02 (×8): 2 via ORAL
  Filled 2014-04-28 (×9): qty 2

## 2014-04-28 NOTE — Progress Notes (Signed)
OT Cancellation Note  Patient Details Name: [name] MRN: [id number] DOB: 1955-11-20   Cancelled Treatment:    Reason Eval/Treat Not Completed: Other (comment).  Pt is working with PT.  Will check back over weekend, if schedule permits.  Jeet Shough 04/28/2014, 2:52 PM  Lesle Chris, OTR/L [phone] 04/28/2014

## 2014-04-28 NOTE — Progress Notes (Signed)
[facility] PROGRESS NOTE Interim History: [name] is a 58 y.o. Female with metastatic breast cancer followed by Dr. Jana Hakim, last chemo on 03/27/14, COPD and hypertension who was admitted to [facility] from 10/26 through 11/4 for sepsis due to pneumonia and COPD exacerbation. Discharged on steroids and levofloxacin. She develop RUQ pain but developed recurrent high fevers with diarrhea (acute on chronic). On the night of admission and discovered that she was confused so they had her brought back to the ER. Was found to be septic without a source of infection.C. Diff was negative. Her temperature has clearly trended down on IV antibiotics, however.   Assessment/Plan: Sepsis of unclear Source: - CT abd/pelvis as below, Stool culture negative, Cdiff PCR negative. lactic acid with in normal. - MRCP  abruptly tapering at the ampulla. No choledocholithiasis is seen. Gi consulted they do feels this is unlikely she will benefit from ERCP. - She has defervesce. Pt cont to complains of spasm on her back. Ct spine pending. - Continue Vanc, Aztreonam, change antibiotics to zosyn. Will con treatment for HCAP. - BCX x2 negative till date,  UC negative - Bp stable tachycardic, no diarrhea.  B/L pleural effusion: - Consult IR relates there is not enough fluid.  Acute encephalopathy: - resolved. - Mild intermittent confusion today. 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 Compliant on lovenox at home  COPD On 2L [place] at home, - No current respiratory distress - Continue home pulmicort and xopenex - One time solumedrol 125mg  - O2 [place]   Chronic RUQ pain and MSK pain:  - At baseline per pt -continue percocet and flexeril -Add lidocaine patch to left chest wall  HTN:  - Normotensive - Continue Norvasc   Leukocytosis: - Slightly worse today, however, received steroids yesterday -Continue to trend WBC  Normocytic anemia: - Due to chemo  therapy. -Approxiamtely stable from yesterday.  Thrombocytopenia: - Now stable ? Due to chemo. - INR <1.5, fibrinogen > 600, smear for schistocytes - Trend plts  Code Status: full Family Communication: patient alone Disposition Plan: Pending further evaluation for source of sepsis   Consultants:  GI  Procedures: Ct abd and pelvis 11.11.2015: Persist extrahepatic biliary ductal dilatation to 1.5 cm, with fairly abrupt transition to completely decompressed extrahepatic bile duct. Stable very mild intrahepatic biliary ductal dilatation. The ductal dilatation could be related to the postcholecystectomy state; however, the possibility of a distal ductal stricture or small ampullary of obstructing lesion is difficult to exclude. Correlation with liver function tests is suggested. . Diffuse bony metastatic disease M, unchanged. Stable prominent paraesophageal lymph node, and partially imaged probable prominent subcarinal lymph node.  Antibiotics:  VAnc and zosyn   HPI/Subjective: Pain improved.  Objective: Filed Vitals:   04/28/14 0449 04/28/14 0508 04/28/14 0538 04/28/14 0718  BP: 133/69     Pulse: 123 109    Temp: 99.4 F (37.4 C)     TempSrc: Oral     Resp: 19     Height:      Weight:    51.9 kg (114 lb 6.7 oz)  SpO2: 89%  92%     Intake/Output Summary (Last 24 hours) at 04/28/14 1113 Last data filed at 04/28/14 0600  Gross per 24 hour  Intake   1519 ml  Output   2200 ml  Net   -681 ml   Filed Weights   04/24/14 2053 04/26/14 0400 04/28/14 0718  Weight: 54.5 kg (120 lb 2.4 oz)  54 kg (119 lb 0.8 oz) 51.9 kg (114 lb 6.7 oz)    Exam:  General: Alert, awake, oriented x3, in no acute distress.  HEENT: No bruits, no goiter.  Heart: Regular rate and rhythm. Lungs: Good air movement, bilateral air movement.  Abdomen: Soft, RUQ tender, positive bowel sounds.  Neuro: Grossly intact, nonfocal.   Data Reviewed: Basic Metabolic Panel:  Recent Labs Lab  04/24/14 1653 04/25/14 0550 04/26/14 0510 04/27/14 0520 04/28/14 0537  NA 138 142 136* 138 135*  K 3.4* 3.7 3.0* 4.4 3.7  CL 99 103 101 101 95*  CO2 22 24 21 22 24   GLUCOSE 68* 132* 135* 98 105*  BUN 14 11 12 10 7   CREATININE 0.69 0.55 0.67 0.58 0.57  CALCIUM 8.7 9.0 8.9 9.9 9.7   Liver Function Tests:  Recent Labs Lab 04/24/14 1653 04/25/14 0550 04/27/14 1602 04/28/14 0537  AST 49* 43*  --  37  ALT 8 8  --  7  ALKPHOS 101 108  --  94  BILITOT 0.4 0.3  --  0.3  PROT 5.7* 5.9* 6.4 6.1  ALBUMIN 2.1* 2.0* 2.1* 2.0*   No results for input(s): LIPASE, AMYLASE in the last 168 hours.  Recent Labs Lab 04/24/14 2200  AMMONIA 19   CBC:  Recent Labs Lab 04/24/14 1653 04/25/14 0550 04/25/14 0900 04/26/14 0510 04/27/14 0520 04/28/14 0537  WBC 15.0* 15.9* 15.7* 12.1* 12.8* 12.2*  NEUTROABS 12.1*  --  13.2*  --   --   --   HGB 8.5* 8.4* 8.4* 7.6* 9.2* 8.5*  HCT 26.0* 25.2* 25.3* 23.4* 29.1* 25.7*  MCV 91.2 90.3 90.7 91.4 93.6 90.8  PLT 77* 63* 59* 52* 52* 41*   Cardiac Enzymes:  Recent Labs Lab 04/24/14 2057  TROPONINI <0.30   BNP (last 3 results)  Recent Labs  04/10/14 1537  PROBNP 298.3*   CBG: No results for input(s): GLUCAP in the last 168 hours.  Recent Results (from the past 240 hour(s))  Blood Culture (routine x 2)     Status: None (Preliminary result)   Collection Time: 04/24/14  4:53 PM  Result Value Ref Range Status   Specimen Description BLOOD LEFT HAND  Final   Special Requests BOTTLES DRAWN AEROBIC AND ANAEROBIC 10 ML  Final   Culture  Setup Time   Final    04/24/2014 22:39 Performed at [facility]    Culture   Final           BLOOD CULTURE RECEIVED NO GROWTH TO DATE CULTURE WILL BE HELD FOR 5 DAYS BEFORE ISSUING A FINAL NEGATIVE REPORT Performed at [facility]    Report Status PENDING  Incomplete  Blood Culture (routine x 2)     Status: None   Collection Time: 04/24/14  4:53 PM  Result Value Ref Range Status    Specimen Description BLOOD LEFT ANTECUBITAL  Final   Special Requests BOTTLES DRAWN AEROBIC AND ANAEROBIC  5 CC  Final   Culture  Setup Time   Final    04/24/2014 22:39 Performed at [facility]    Culture   Final    STAPHYLOCOCCUS SPECIES (COAGULASE NEGATIVE) Note: THE SIGNIFICANCE OF ISOLATING THIS ORGANISM FROM A SINGLE SET OF BLOOD CULTURES WHEN MULTIPLE SETS ARE DRAWN IS UNCERTAIN. PLEASE NOTIFY THE MICROBIOLOGY DEPARTMENT WITHIN ONE WEEK IF SPECIATION AND SENSITIVITIES ARE REQUIRED. Note: Gram Stain Report Called to,Read Back By and Verified With: DELISA COBB ON 04/25/2014 AT 10:45P BY [facility] Performed  at [facility]    Report Status 04/27/2014 FINAL  Final  Urine culture     Status: None   Collection Time: 04/24/14  5:02 PM  Result Value Ref Range Status   Specimen Description URINE, CLEAN CATCH  Final   Special Requests NONE  Final   Culture  Setup Time   Final    04/24/2014 23:08 Performed at [place]   Final    30,000 COLONIES/ML Performed at [facility]    Culture   Final    ENTEROCOCCUS SPECIES Performed at [facility]    Report Status 04/28/2014 FINAL  Final   Organism ID, Bacteria ENTEROCOCCUS SPECIES  Final      Susceptibility   Enterococcus species - MIC*    AMPICILLIN <=2 SENSITIVE Sensitive     LEVOFLOXACIN 4 INTERMEDIATE Intermediate     NITROFURANTOIN 32 SENSITIVE Sensitive     VANCOMYCIN <=0.5 SENSITIVE Sensitive     TETRACYCLINE <=1 SENSITIVE Sensitive     * ENTEROCOCCUS SPECIES  Clostridium Difficile by PCR     Status: None   Collection Time: 04/25/14  9:42 AM  Result Value Ref Range Status   C difficile by pcr NEGATIVE NEGATIVE Final    Comment: Performed at [facility]  Stool culture     Status: None (Preliminary result)   Collection Time: 04/25/14  9:42 AM  Result Value Ref Range Status   Specimen Description STOOL  Final   Special Requests NONE  Final   Culture   Final     NO SUSPICIOUS COLONIES, CONTINUING TO HOLD Performed at [facility]    Report Status PENDING  Incomplete     Studies: Mr Abdomen Mrcp Wo Cm  04/26/2014   CLINICAL DATA:  Severe right upper quadrant pain, biliary ductal dilatation on CT, history of metastatic breast cancer, chemotherapy ongoing  EXAM: MRI ABDOMEN WITHOUT CONTRAST  (INCLUDING MRCP)  TECHNIQUE: Multiplanar multisequence MR imaging of the abdomen was performed. Heavily T2-weighted images of the biliary and pancreatic ducts were obtained, and three-dimensional MRCP images were rendered by post processing.  COMPARISON:  CT abdomen pelvis dated 04/25/2014  FINDINGS: The examination had to be discontinued prior to completion due to patient discomfort.  Motion degraded images.  Trace bilateral pleural effusions with associated compressive atelectasis at the lung bases.  Liver is unremarkable.  No focal hepatic lesions.  Status post cholecystectomy. No intrahepatic ductal dilatation. Dilated common duct, measuring up to 13 mm, and abruptly tapering at the ampulla. No choledocholithiasis is seen.  Pancreas is grossly unremarkable. No dilatation of the main pancreatic duct.  Spleen and adrenal glands are grossly unremarkable.  Kidneys are within normal limits.  No hydronephrosis.  Small left para-aortic nodes measuring up to 8 mm short axis.  No abdominal ascites.  Body wall edema.  Soft tissue mass/inflammatory changes involving the right breast (series 3/image 7), incompletely visualized, likely corresponding to known right breast cancer.  IMPRESSION: Study had to be discontinued prior to completion due to patient discomfort. Motion degraded images.  Status post cholecystectomy. No intrahepatic ductal dilatation. Dilated common duct, measuring up to 13 mm, an abruptly tapering at the ampulla. No choledocholithiasis is seen.  In the setting of essentially normal LFTs, this appearance is favored to be postsurgical. Less likely, this could  reflect a distal CBD stricture. If there is continued clinical concern, consider ERCP.   Electronically Signed   By: Henderson Newcomer.D.  On: 04/26/2014 14:15   Mr 3d Recon At Scanner  04/26/2014   CLINICAL DATA:  Severe right upper quadrant pain, biliary ductal dilatation on CT, history of metastatic breast cancer, chemotherapy ongoing  EXAM: MRI ABDOMEN WITHOUT CONTRAST  (INCLUDING MRCP)  TECHNIQUE: Multiplanar multisequence MR imaging of the abdomen was performed. Heavily T2-weighted images of the biliary and pancreatic ducts were obtained, and three-dimensional MRCP images were rendered by post processing.  COMPARISON:  CT abdomen pelvis dated 04/25/2014  FINDINGS: The examination had to be discontinued prior to completion due to patient discomfort.  Motion degraded images.  Trace bilateral pleural effusions with associated compressive atelectasis at the lung bases.  Liver is unremarkable.  No focal hepatic lesions.  Status post cholecystectomy. No intrahepatic ductal dilatation. Dilated common duct, measuring up to 13 mm, and abruptly tapering at the ampulla. No choledocholithiasis is seen.  Pancreas is grossly unremarkable. No dilatation of the main pancreatic duct.  Spleen and adrenal glands are grossly unremarkable.  Kidneys are within normal limits.  No hydronephrosis.  Small left para-aortic nodes measuring up to 8 mm short axis.  No abdominal ascites.  Body wall edema.  Soft tissue mass/inflammatory changes involving the right breast (series 3/image 7), incompletely visualized, likely corresponding to known right breast cancer.  IMPRESSION: Study had to be discontinued prior to completion due to patient discomfort. Motion degraded images.  Status post cholecystectomy. No intrahepatic ductal dilatation. Dilated common duct, measuring up to 13 mm, an abruptly tapering at the ampulla. No choledocholithiasis is seen.  In the setting of essentially normal LFTs, this appearance is favored to be  postsurgical. Less likely, this could reflect a distal CBD stricture. If there is continued clinical concern, consider ERCP.   Electronically Signed   By: Julian Hy M.D.   On: 04/26/2014 14:15    Scheduled Meds: . amLODipine  2.5 mg Oral Daily  . budesonide  0.5 mg Nebulization Q12H  . cholecalciferol  2,000 Units Oral Daily  . enoxaparin  80 mg Subcutaneous Q24H  . lidocaine  1 patch Transdermal Q24H  . LORazepam  0.5 mg Oral 3 times per day  . pantoprazole  40 mg Oral Daily  . piperacillin-tazobactam (ZOSYN)  IV  3.375 g Intravenous Q8H  . polyethylene glycol  17 g Oral BID  . sodium chloride  3 mL Intravenous Q12H  . vancomycin  1,000 mg Intravenous Q12H   Continuous Infusions: . sodium chloride 75 mL/hr at 04/27/14 2221     Charlynne Cousins  [facility] Pager [phone]. If 8PM-8AM, please contact night-coverage at [email or web address], password [facility] 04/28/2014, 11:13 AM  LOS: 4 days

## 2014-04-28 NOTE — Plan of Care (Signed)
Problem: Phase I Progression Outcomes Goal: OOB as tolerated unless otherwise ordered Outcome: Progressing Goal: Initial discharge plan identified Outcome: Progressing SW and CM assisting with options for d/c.

## 2014-04-28 NOTE — Progress Notes (Addendum)
Physical Therapy Treatment Patient Details Name: [name] MRN: [id number] DOB: June 10, 1956 Today's Date: 04/28/2014    History of Present Illness Pt with recent admission from 10/26-11/4 for Sepsis, encephalopathy, and pneumonia/respiratory failure. Admitted 04/24/14 for diarrhea and fever.     PT Comments    Pt was able to increase gait tolerance to 40 feet this tx.  Pt is min-min guard A with all mobility.  Following bed mobility pt was at 88% O2 on 3L; 2/4 dyspnea scale.  Pt was at 100% O2 following gait.  Follow Up Recommendations  SNF;Supervision/Assistance - 24 hour     Equipment Recommendations  None recommended by PT    Recommendations for Other Services OT consult     Precautions / Restrictions Precautions Precautions: Fall Precaution Comments: requires O2/monitor sats Restrictions Weight Bearing Restrictions: No    Mobility  Bed Mobility Overal bed mobility: Needs Assistance Bed Mobility: Supine to Sit;Sit to Supine     Supine to sit: Min assist;HOB elevated Sit to supine: Min assist;HOB elevated   General bed mobility comments: pt requires min handheld A for supine to sit; requires min A with LEs for sit to supine; increased time  Transfers Overall transfer level: Needs assistance Equipment used: Rolling walker (2 wheeled) Transfers: Sit to/from Stand Sit to Stand: Min assist         General transfer comment: Assist to rise, stabiilze, control descent; VCs for hand placement  Ambulation/Gait Ambulation/Gait assistance: Min guard Ambulation Distance (Feet): 40 Feet Assistive device: Rolling walker (2 wheeled) Gait Pattern/deviations: Step-through pattern;Decreased stride length     General Gait Details: pt fatigued during gait; required 2 short standing rest breaks   Stairs            Wheelchair Mobility    Modified Rankin (Stroke Patients Only)       Balance                                    Cognition  Arousal/Alertness: Awake/alert Behavior During Therapy: WFL for tasks assessed/performed Overall Cognitive Status: Within Functional Limits for tasks assessed                      Exercises      General Comments        Pertinent Vitals/Pain Pain Assessment: 0-10 Pain Score: 5  Pain Location: abdomen and back Pain Intervention(s): Monitored during session;Limited activity within patient's tolerance;Repositioned;Patient requesting pain meds-RN notified    Home Living                      Prior Function            PT Goals (current goals can now be found in the care plan section) Progress towards PT goals: Progressing toward goals    Frequency  Min 3X/week    PT Plan Current plan remains appropriate    Co-evaluation             End of Session Equipment Utilized During Treatment: Gait belt;Oxygen Activity Tolerance: Patient tolerated treatment well Patient left: in bed;with call bell/phone within reach     Time: 1419-1449 PT Time Calculation (min) (ACUTE ONLY): 30 min  Charges:                       G Codes:      [name],[name], SPTA 04/28/2014, 3:05 PM  Reviewed above  [name]  PTA [facility]  Acute  Rehab Pager      [phone]

## 2014-04-28 NOTE — Progress Notes (Signed)
CSW continuing to follow.  CSW initiated a Tourist information centre manager and pt does not have any bed offers at this time. Barrier is that pt has Medicaid only and has not yet been approved disability. CSW spoke to [facility] Re[facility] who is considering pt, but will not be able to provide definitive bed offer until discharge medications determined in order for facility to cost out medications.   CSW contacted pt daughter, [name] to discuss. CSW discussed with pt daughter that pt does not have any bed offers at this time and only considering facility is [facility]. Pt daughter discussed that she feels that [facility] would still be too far for pt and pt family for placement. CSW discussed potential home option for pt and pt daughter reports that pt family can provide care for pt at home with the assistance of home health services. Pt daughter feels that pt would be a lot happier at home and any facility outside of [facility] would be too far for family. CSW expressed understanding. Pt daughter is agreeable to CSW following up with [facility] once discharge medications are determined just to determine if facility is able to offer a bed and have potential option available in the instance that pt family change their mind. Pt daughter appears to be leaning toward home with home health and family support upon discharge given lack of options for SNF. Pt daughter expressed understanding regarding situation and appreciative of CSW support.  CSW contacted [facility] to discuss in order to possibly get pt seen by PT/OT more while in the hospital.   CSW updated RNCM.  CSW to continue to follow to provide support and assist with disposition needs as appropriate.  Alison Murray, MSW, [place] [employer] [phone]

## 2014-04-28 NOTE — Progress Notes (Signed)
Patients does not wear CPAP

## 2014-04-29 LAB — STOOL CULTURE

## 2014-04-29 LAB — CBC
HEMATOCRIT: 24.6 % — AB (ref 36.0–46.0)
Hemoglobin: 8 g/dL — ABNORMAL LOW (ref 12.0–15.0)
MCH: 29.6 pg (ref 26.0–34.0)
MCHC: 32.5 g/dL (ref 30.0–36.0)
MCV: 91.1 fL (ref 78.0–100.0)
PLATELETS: 34 10*3/uL — AB (ref 150–400)
RBC: 2.7 MIL/uL — ABNORMAL LOW (ref 3.87–5.11)
RDW: 19.8 % — AB (ref 11.5–15.5)
WBC: 12.5 10*3/uL — AB (ref 4.0–10.5)

## 2014-04-29 LAB — BASIC METABOLIC PANEL
Anion gap: 14 (ref 5–15)
BUN: 5 mg/dL — ABNORMAL LOW (ref 6–23)
CALCIUM: 10.4 mg/dL (ref 8.4–10.5)
CO2: 25 mEq/L (ref 19–32)
Chloride: 101 mEq/L (ref 96–112)
Creatinine, Ser: 0.59 mg/dL (ref 0.50–1.10)
GFR calc Af Amer: >90 mL/min (ref >90)
Glucose, Bld: 114 mg/dL — ABNORMAL HIGH (ref 70–99)
Potassium: 3.9 mEq/L (ref 3.7–5.3)
SODIUM: 140 meq/L (ref 137–147)

## 2014-04-29 MED ORDER — ALTEPLASE 2 MG IJ SOLR
2.0000 mg | Freq: Once | INTRAMUSCULAR | Status: AC
Start: 2014-04-29 — End: 2014-04-29
  Administered 2014-04-29: 2 mg
  Filled 2014-04-29: qty 2

## 2014-04-29 MED ORDER — LEVOFLOXACIN 750 MG PO TABS
750.0000 mg | ORAL_TABLET | Freq: Every day | ORAL | Status: DC
  Administered 2014-04-29 – 2014-05-01 (×3): 750 mg via ORAL
  Filled 2014-04-29 (×4): qty 1

## 2014-04-29 MED ORDER — SODIUM CHLORIDE 0.9 % IJ SOLN
10.0000 mL | INTRAMUSCULAR | Status: DC | PRN
  Administered 2014-04-29 (×2): 20 mL
  Filled 2014-04-29: qty 40

## 2014-04-29 NOTE — Progress Notes (Signed)
Pt states that she does not wear/want cpap.  Pt prefers [place] at night.  Pt remains on 3lnc.

## 2014-04-29 NOTE — Progress Notes (Signed)
[facility] PROGRESS NOTE Interim History: [name] is a 58 y.o. Female with metastatic breast cancer followed by Dr. Jana Hakim, last chemo on 03/27/14, COPD and hypertension who was admitted to [facility] from 10/26 through 11/4 for sepsis due to pneumonia and COPD exacerbation. Discharged on steroids and levofloxacin. She develop RUQ pain but developed recurrent high fevers with diarrhea (acute on chronic). On the night of admission and discovered that she was confused so they had her brought back to the ER. Was found to be septic without a source of infection.C. Diff was negative. Her temperature has clearly trended down on IV antibiotics, however.   Assessment/Plan: SIRS: - With Fever, tachypneic, leukocytosis and encephalopathic. - CT abd/pelvis showed dilated bile ducts, Stool culture negative, Cdiff PCR negative. lactic acid with in normal. - Started on empiric antibiotics vanc and zosyn. MRCP  abruptly tapering at the ampulla.  No choledocholithiasis is seen. Gi consulted they do feels that she will benefit from ERCP. Unlikely  Cholangitis. - She has defervesce.  - Started on 11.9.2015 Vanc, Aztreonam, de-escalate to levaquin. - BCX x2 negative till date,  UC negative - Bp stable tachycardic resolved, no diarrhea.  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 Jana Hakim on Neoadjuvant chemo.  - last chemo on October. - Now with metastatic disease to her thoracici spine. - CT thoracic spine as below. Will D/w oncology for possible Radiation to spine. - Cont narcotics. Family want to be present when we tell her that her cancer continues to spread despite Chemo. They will be at bedside on 11.15.2015.  B/L pleural effusion: - Consult IR relates there is not enough fluid.  Acute encephalopathy: - resolved. - Mild intermittent confusion today. 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 Compliant on lovenox at home  COPD On 2L [place] at home, - No current respiratory distress - Continue home pulmicort and xopenex - One time solumedrol 125mg  - O2 [place]   HTN:  - Normotensive - Continue Norvasc   Leukocytosis: - stable at 12.  Normocytic anemia: - Due to chemo therapy. -Approxiamtely stable from yesterday.  Thrombocytopenia: - due to chemo - PLT's trending down. Stop heparin due SCD's. - no signs os bleeding.  Code Status: full Family Communication: patient alone Disposition Plan: Pending further evaluation for source of sepsis   Consultants:  GI  Procedures: Ct abd and pelvis 11.11.2015: Persist extrahepatic biliary ductal dilatation to 1.5 cm, with fairly abrupt transition to completely decompressed extrahepatic bile duct. Stable very mild intrahepatic biliary ductal dilatation. The ductal dilatation could be related to the postcholecystectomy state; however, the possibility of a distal ductal stricture or small ampullary of obstructing lesion is difficult to exclude. Correlation with liver function tests is suggested. . Diffuse bony metastatic disease M, unchanged. Stable prominent paraesophageal lymph node, and partially imaged probable prominent subcarinal lymph node. CT thoracic spine 11.13.2015: Widespread osseous metastatic disease. No dominant lytic lesions or evidence of extraosseous tumor. No evidence of canal compromise  Antibiotics:  VAnc and zosyn   HPI/Subjective: Pain controlled  Objective: Filed Vitals:   04/28/14 1926 04/28/14 2236 04/29/14 0557 04/29/14 0757  BP:  111/56 131/61   Pulse:  115 117   Temp:  99.1 F (37.3 C) 98.8 F (37.1 C)   TempSrc:  Oral Oral   Resp:  20 20   Height:      Weight:   51.6 kg (  113 lb 12.1 oz)   SpO2: 93% 97% 95% 95%    Intake/Output Summary (Last 24 hours) at 04/29/14 0932 Last data filed at 04/29/14 0600  Gross per 24 hour  Intake   1800 ml  Output   3525 ml  Net  -1725 ml   Filed  Weights   04/26/14 0400 04/28/14 0718 04/29/14 0557  Weight: 54 kg (119 lb 0.8 oz) 51.9 kg (114 lb 6.7 oz) 51.6 kg (113 lb 12.1 oz)    Exam:  General: Alert, awake, oriented x3, in no acute distress.  HEENT: No bruits, no goiter.  Heart: Regular rate and rhythm. Lungs: Good air movement, bilateral air movement.  Abdomen: Soft, RUQ tender, positive bowel sounds.  Neuro: Grossly intact, nonfocal.   Data Reviewed: Basic Metabolic Panel:  Recent Labs Lab 04/25/14 0550 04/26/14 0510 04/27/14 0520 04/28/14 0537 04/29/14 0741  NA 142 136* 138 135* 140  K 3.7 3.0* 4.4 3.7 3.9  CL 103 101 101 95* 101  CO2 24 21 22 24 25   GLUCOSE 132* 135* 98 105* 114*  BUN 11 12 10 7  5*  CREATININE 0.55 0.67 0.58 0.57 0.59  CALCIUM 9.0 8.9 9.9 9.7 10.4   Liver Function Tests:  Recent Labs Lab 04/24/14 1653 04/25/14 0550 04/27/14 1602 04/28/14 0537  AST 49* 43*  --  37  ALT 8 8  --  7  ALKPHOS 101 108  --  94  BILITOT 0.4 0.3  --  0.3  PROT 5.7* 5.9* 6.4 6.1  ALBUMIN 2.1* 2.0* 2.1* 2.0*   No results for input(s): LIPASE, AMYLASE in the last 168 hours.  Recent Labs Lab 04/24/14 2200  AMMONIA 19   CBC:  Recent Labs Lab 04/24/14 1653  04/25/14 0900 04/26/14 0510 04/27/14 0520 04/28/14 0537 04/29/14 0741  WBC 15.0*  < > 15.7* 12.1* 12.8* 12.2* 12.5*  NEUTROABS 12.1*  --  13.2*  --   --   --   --   HGB 8.5*  < > 8.4* 7.6* 9.2* 8.5* 8.0*  HCT 26.0*  < > 25.3* 23.4* 29.1* 25.7* 24.6*  MCV 91.2  < > 90.7 91.4 93.6 90.8 91.1  PLT 77*  < > 59* 52* 52* 41* 34*  < > = values in this interval not displayed. Cardiac Enzymes:  Recent Labs Lab 04/24/14 2057  TROPONINI <0.30   BNP (last 3 results)  Recent Labs  04/10/14 1537  PROBNP 298.3*   CBG: No results for input(s): GLUCAP in the last 168 hours.  Recent Results (from the past 240 hour(s))  Blood Culture (routine x 2)     Status: None (Preliminary result)   Collection Time: 04/24/14  4:53 PM  Result Value Ref  Range Status   Specimen Description BLOOD LEFT HAND  Final   Special Requests BOTTLES DRAWN AEROBIC AND ANAEROBIC 10 ML  Final   Culture  Setup Time   Final    04/24/2014 22:39 Performed at [facility]    Culture   Final           BLOOD CULTURE RECEIVED NO GROWTH TO DATE CULTURE WILL BE HELD FOR 5 DAYS BEFORE ISSUING A FINAL NEGATIVE REPORT Performed at [facility]    Report Status PENDING  Incomplete  Blood Culture (routine x 2)     Status: None   Collection Time: 04/24/14  4:53 PM  Result Value Ref Range Status   Specimen Description BLOOD LEFT ANTECUBITAL  Final   Special Requests BOTTLES  DRAWN AEROBIC AND ANAEROBIC  5 CC  Final   Culture  Setup Time   Final    04/24/2014 22:39 Performed at [facility]    Culture   Final    STAPHYLOCOCCUS SPECIES (COAGULASE NEGATIVE) Note: THE SIGNIFICANCE OF ISOLATING THIS ORGANISM FROM A SINGLE SET OF BLOOD CULTURES WHEN MULTIPLE SETS ARE DRAWN IS UNCERTAIN. PLEASE NOTIFY THE MICROBIOLOGY DEPARTMENT WITHIN ONE WEEK IF SPECIATION AND SENSITIVITIES ARE REQUIRED. Note: Gram Stain Report Called to,Read Back By and Verified With: DELISA COBB ON 04/25/2014 AT 10:45P BY WILEJ Performed at [facility]    Report Status 04/27/2014 FINAL  Final  Urine culture     Status: None   Collection Time: 04/24/14  5:02 PM  Result Value Ref Range Status   Specimen Description URINE, CLEAN CATCH  Final   Special Requests NONE  Final   Culture  Setup Time   Final    04/24/2014 23:08 Performed at [facility]   Final    30,000 COLONIES/ML Performed at [facility]    Culture   Final    ENTEROCOCCUS SPECIES Performed at [facility]    Report Status 04/28/2014 FINAL  Final   Organism ID, Bacteria ENTEROCOCCUS SPECIES  Final      Susceptibility   Enterococcus species - MIC*    AMPICILLIN <=2 SENSITIVE Sensitive     LEVOFLOXACIN 4 INTERMEDIATE Intermediate     NITROFURANTOIN 32  SENSITIVE Sensitive     VANCOMYCIN <=0.5 SENSITIVE Sensitive     TETRACYCLINE <=1 SENSITIVE Sensitive     * ENTEROCOCCUS SPECIES  Clostridium Difficile by PCR     Status: None   Collection Time: 04/25/14  9:42 AM  Result Value Ref Range Status   C difficile by pcr NEGATIVE NEGATIVE Final    Comment: Performed at [facility]  Stool culture     Status: None (Preliminary result)   Collection Time: 04/25/14  9:42 AM  Result Value Ref Range Status   Specimen Description STOOL  Final   Special Requests NONE  Final   Culture   Final    NO SUSPICIOUS COLONIES, CONTINUING TO HOLD Performed at [facility]    Report Status PENDING  Incomplete     Studies: Ct Thoracic Spine W Contrast  04/28/2014   CLINICAL DATA:  Upper back pain, 6 months duration. Recent episode of pneumonia. Personal history of left breast cancer with ongoing chemotherapy treatment.  EXAM: CT THORACIC SPINE WITH CONTRAST  TECHNIQUE: Multidetector CT imaging of the thoracic spine was performed using the standard protocol following bolus administration of intravenous contrast. Multiplanar CT image reconstructions were also generated.  CONTRAST:  44mL OMNIPAQUE IOHEXOL 300 MG/ML  SOLN  COMPARISON:  CT chest 04/11/2014, chest CT 04/03/2014  FINDINGS: The bones have a diffusely heterogeneous appearance consistent with widespread osseous metastatic disease. Newly seen since the previous studies is a fracture of the spinous process of T1, presumably a pathologic fracture. This does not affect the lamina. Additionally, there is a newly seen partial compression fracture of T9 with loss of height of about 20%. No retropulsed bone. There appears to be a minimal superior endplate fracture at L1, not completely image to. No other regional fracture is seen. Posterior medial ribs do not show any discernible fracture. There is no dominant lytic lesion. No extraosseous tumor or canal compromise is discernible. No significant change in  the chest disease with right lung mass, lymphadenopathy and  small effusions with dependent atelectasis.  IMPRESSION: Widespread osseous metastatic disease. No dominant lytic lesions or evidence of extraosseous tumor. No evidence of canal compromise.  Newly seen spinous process fracture at T1.  Newly seen compression fracture at T9 with loss of height of 20% without retropulsion.  Newly seen minimal superior endplate fracture at L1.   Electronically Signed   By: Nelson Chimes M.D.   On: 04/28/2014 15:29    Scheduled Meds: . amLODipine  2.5 mg Oral Daily  . budesonide  0.5 mg Nebulization Q12H  . cholecalciferol  2,000 Units Oral Daily  . lidocaine  1 patch Transdermal Q24H  . LORazepam  0.5 mg Oral 3 times per day  . pantoprazole  40 mg Oral Daily  . piperacillin-tazobactam (ZOSYN)  IV  3.375 g Intravenous Q8H  . polyethylene glycol  17 g Oral BID  . sodium chloride  3 mL Intravenous Q12H  . vancomycin  1,000 mg Intravenous Q12H   Continuous Infusions: . sodium chloride 75 mL/hr at 04/27/14 2221     Charlynne Cousins  [facility] Pager [phone]. If 8PM-8AM, please contact night-coverage at [email or web address], password [facility] 04/29/2014, 9:32 AM  LOS: 5 days

## 2014-04-29 NOTE — Progress Notes (Signed)
Occupational Therapy Treatment Patient Details Name: [name] MRN: [id number] DOB: [birth year] Today's Date: 04/29/2014    History of present illness Pt with recent admission from 10/26-11/4 for Sepsis, encephalopathy, and pneumonia/respiratory failure. Admitted 04/24/14 for diarrhea and fever.    OT comments  Pt informed this OT upon arrival that she had a BM in bed. Assisted pt to standing to wash periareas. Pt able to wash own periareas with only min assist for thoroughness but does fatigue with longer standing activity. Assisted to pivot around to recliner and HR taken once in chair and was 135 max. Encouraged PLB and able to rest and HR decreased to 110s. Pt noted to have 2-3/4 dyspnea. Bed changed and nursing made aware of soft BM from bed. Note SW note that family may decide to have pt d/c to home with [facility] therapy rather than SNF. Feel pt can benefit from SNF but if pt does d/c home, recommend [facility].     Follow Up Recommendations  SNF;Supervision/Assistance - 24 hour    Equipment Recommendations  None recommended by OT    Recommendations for Other Services      Precautions / Restrictions Precautions Precautions: Fall Precaution Comments: requires O2/monitor sats and HR       Mobility Bed Mobility   Bed Mobility: Supine to Sit     Supine to sit: Min guard     General bed mobility comments: increased time.  Transfers Overall transfer level: Needs assistance Equipment used: Rolling walker (2 wheeled) Transfers: Sit to/from Stand Sit to Stand: Min assist         General transfer comment: some assist to rise and steady. verbal cues for hand placement. increased time. Able to stand from recliner to have bed pad placed in chair with min guard assist and walker. Pt stood for approximately 1.5 minutes for washing task and does fatigue and become SOB.     Balance                                   ADL                           Toilet  Transfer: Minimal assistance;Stand-pivot;RW   Toileting- Clothing Manipulation and Hygiene: Minimal assistance;Sit to/from stand         General ADL Comments: for thoroughness after BM in bed. Stood to wash periareas.      Vision                     Perception     Praxis      Cognition   Behavior During Therapy: [facility] for tasks assessed/performed Overall Cognitive Status: Within Functional Limits for tasks assessed                       Extremity/Trunk Assessment               Exercises     Shoulder Instructions       General Comments      Pertinent Vitals/ Pain       Pain Assessment: No/denies pain. HR max after bathing task 135. Down to 110s with rest. O2 on 3L 93% once in chair.   Home Living  Prior Functioning/Environment              Frequency Min 2X/week     Progress Toward Goals  OT Goals(current goals can now be found in the care plan section)  Progress towards OT goals: Progressing toward goals     Plan Discharge plan remains appropriate    Co-evaluation                 End of Session Equipment Utilized During Treatment: Rolling walker;Oxygen   Activity Tolerance Patient limited by fatigue;Other (comment) (HR increases)   Patient Left in chair;with call bell/phone within reach   Nurse Communication          Time: 3888-7579 OT Time Calculation (min): 33 min  Charges: OT General Charges $OT Visit: 1 Procedure OT Treatments $Self Care/Home Management : 8-22 mins $Therapeutic Activity: 8-22 mins  Jules Schick  [phone] 04/29/2014, 2:10 PM

## 2014-04-29 NOTE — Plan of Care (Signed)
Problem: Phase III Progression Outcomes Goal: Pain controlled on oral analgesia Outcome: Progressing  Problem: Discharge Progression Outcomes Goal: Pain controlled with appropriate interventions Outcome: Progressing Goal: Tolerating diet Outcome: Progressing

## 2014-04-30 ENCOUNTER — Telehealth: Payer: Self-pay | Admitting: Nurse Practitioner

## 2014-04-30 DIAGNOSIS — J438 Other emphysema: Secondary | ICD-10-CM

## 2014-04-30 LAB — BASIC METABOLIC PANEL
ANION GAP: 13 (ref 5–15)
BUN: 5 mg/dL — ABNORMAL LOW (ref 6–23)
CHLORIDE: 104 meq/L (ref 96–112)
CO2: 26 mEq/L (ref 19–32)
Calcium: 11.2 mg/dL — ABNORMAL HIGH (ref 8.4–10.5)
Creatinine, Ser: 0.69 mg/dL (ref 0.50–1.10)
GFR calc non Af Amer: >90 mL/min (ref >90)
Glucose, Bld: 87 mg/dL (ref 70–99)
Potassium: 3.5 mEq/L — ABNORMAL LOW (ref 3.7–5.3)
Sodium: 143 mEq/L (ref 137–147)

## 2014-04-30 LAB — CBC
HCT: 23.6 % — ABNORMAL LOW (ref 36.0–46.0)
HEMOGLOBIN: 7.5 g/dL — AB (ref 12.0–15.0)
MCH: 29.4 pg (ref 26.0–34.0)
MCHC: 31.8 g/dL (ref 30.0–36.0)
MCV: 92.5 fL (ref 78.0–100.0)
Platelets: 28 10*3/uL — CL (ref 150–400)
RBC: 2.55 MIL/uL — AB (ref 3.87–5.11)
RDW: 19.7 % — ABNORMAL HIGH (ref 11.5–15.5)
WBC: 10.9 10*3/uL — ABNORMAL HIGH (ref 4.0–10.5)

## 2014-04-30 LAB — CULTURE, BLOOD (ROUTINE X 2): CULTURE: NO GROWTH

## 2014-04-30 MED ORDER — ENSURE COMPLETE PO LIQD
237.0000 mL | Freq: Two times a day (BID) | ORAL | Status: DC
  Administered 2014-05-01 – 2014-05-03 (×4): 237 mL via ORAL

## 2014-04-30 NOTE — Progress Notes (Addendum)
Pt does not wear cpap at night.  Pt prefers [place], currently at 3Lpm.

## 2014-04-30 NOTE — Progress Notes (Signed)
Foley catheter removed at this time.  Patient tolerated without difficulty.    [name] M

## 2014-04-30 NOTE — Progress Notes (Signed)
CRITICAL VALUE ALERT  Critical value received:  PLT 28  Date of notification:  04/30/2014   Time of notification:  7:12 AM   Critical value read back:Yes.    Nurse who received alert:  Joaquin Courts   MD notified (1st page):  Dr Aileen Fass  Time of first page:  7:12 AM   MD notified (2nd page):  Time of second page:  Responding MD:  Dyann Kief  Time MD responded:  [phone]

## 2014-04-30 NOTE — Progress Notes (Signed)
Occupational Therapy Treatment Patient Details Name: [name] MRN: [id number] DOB: Oct 09, 1955 Today's Date: 04/30/2014    History of present illness Pt with recent admission from 10/26-11/4 for Sepsis, encephalopathy, and pneumonia/respiratory failure. Admitted 04/24/14 for diarrhea and fever.    OT comments  Pt tolerated session well.  Sat in chair for 10 minutes while sheets were changed, but wanted to return to bed as she felt she would sleep.  Follow Up Recommendations  SNF;Supervision/Assistance - 24 hour    Equipment Recommendations  None recommended by OT    Recommendations for Other Services      Precautions / Restrictions Precautions Precautions: Fall Precaution Comments: requires O2/monitor sats and HR Restrictions Weight Bearing Restrictions: No       Mobility Bed Mobility   Bed Mobility: Supine to Sit     Supine to sit: Supervision     General bed mobility comments: extra time  Transfers     Transfers: Sit to/from [facility] Sit to Stand: Min guard Stand pivot transfers: Min guard (to recliner)       General transfer comment: close guard for safety    Balance                                   ADL       Grooming: Oral care;Set up (lotion)                   Toilet Transfer: Minimal assistance;Stand-pivot (to recliner and back to bed)             General ADL Comments: sat EOB for grooming and transferred to chair then back to bed as drink was spilled on sheets.  Pt had just used bathroom and had washed up prior to OT's arrival.  Sat eob for grooming due to fatique      Vision                     Perception     Praxis      Cognition   Behavior During Therapy: [facility] for tasks assessed/performed Overall Cognitive Status: Within Functional Limits for tasks assessed                       Extremity/Trunk Assessment               Exercises     Shoulder Instructions        General Comments      Pertinent Vitals/ Pain       Pain Assessment: 0-10 Pain Score: 4  (once up in chair) Faces Pain Scale: Hurts little more Pain Location: abdomen Pain Descriptors / Indicators: Sore Pain Intervention(s): Limited activity within patient's tolerance;Monitored during session;Premedicated before session;Repositioned  Home Living                                          Prior Functioning/Environment              Frequency Min 2X/week     Progress Toward Goals  OT Goals(current goals can now be found in the care plan section)  Progress towards OT goals: Progressing toward goals     Plan      Co-evaluation  End of Session     Activity Tolerance Patient limited by fatigue   Patient Left in bed;with call bell/phone within reach   Nurse Communication          Time: 1020-1050 OT Time Calculation (min): 30 min  Charges: OT General Charges $OT Visit: 1 Procedure OT Treatments $Self Care/Home Management : 8-22 mins $Therapeutic Activity: 8-22 mins  [name] 04/30/2014, 10:59 AM Lesle Chris, OTR/L [phone] 04/30/2014  .

## 2014-04-30 NOTE — Telephone Encounter (Signed)
, °

## 2014-04-30 NOTE — Progress Notes (Signed)
[facility] PROGRESS NOTE Interim History: [name] is a 58 y.o. Female with metastatic breast cancer followed by Dr. Jana Hakim, last chemo on 03/27/14, COPD and hypertension who was admitted to [facility] from 10/26 through 11/4 for sepsis due to pneumonia and COPD exacerbation. Discharged on steroids and levofloxacin. She develop RUQ pain but developed recurrent high fevers with diarrhea (acute on chronic). On the night of admission and discovered that she was confused so they had her brought back to the ER. Was found to be septic without a source of infection.C. Diff was negative. Her temperature has clearly trended down on IV antibiotics, however.   Assessment/Plan: SIRS: - With Fever, tachypneic, leukocytosis and encephalopathic. - CT abd/pelvis showed dilated bile ducts, Stool culture negative, Cdiff PCR negative. lactic acid with in normal. - Started on empiric antibiotics vanc and zosyn. MRCP  abruptly tapering at the ampulla.  No choledocholithiasis is seen. Gi consulted they do feels that she will benefit from ERCP. Unlikely  Cholangitis. - remains afebrile  - Started on 11.9.2015 Vanc, Aztreonam, de-escalated to levaquin on 11/14. - BCX x2 negative till date,  UC negative - Bp stable, tachycardia and tachypnea resolved, no diarrhea.  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 Jana Hakim on Neoadjuvant chemo.  - last chemo on October. - Now with metastatic disease to her thoracici spine. - CT thoracic spine as below. Will D/w oncology for possible Radiation to spine. - Cont narcotics as needed; will adjust for better pain controlled. Patient and family advise possible mental status changes while increasing narcotics dose and increase risk for aspiration  B/L pleural effusion: - Consult IR relates there is not enough fluid. -will monitor  Acute encephalopathy: - resolved. 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 Compliant on lovenox at home -heparin products on hold due to thrombocytopenia  COPD On 2L [place] at home, -No current respiratory distress or wheezing -Continue home pulmicort and xopenex -continue O2 [place] supplementation   HTN:  -well controlled -Continue Norvasc   Leukocytosis: -trending dow -no fever -continue current antibiotics  Normocytic anemia: -Due to chemotherapy. -Follow Hgb trend -no need for transfusion   Thrombocytopenia: - due to chemo - PLT's trending down. Stop heparin, will use SCD's. - no signs os bleeding.  GERD -continue PPI  protein calorie malnutrition severe -continue feeding supplements Patient encourage to increase PO intake  Code Status: full Family Communication: patient alone Disposition Plan: to be determine   Consultants:  GI  Procedures: Ct abd and pelvis 11.11.2015: Persist extrahepatic biliary ductal dilatation to 1.5 cm, with fairly abrupt transition to completely decompressed extrahepatic bile duct. Stable very mild intrahepatic biliary ductal dilatation. The ductal dilatation could be related to the postcholecystectomy state; however, the possibility of a distal ductal stricture or small ampullary of obstructing lesion is difficult to exclude. Correlation with liver function tests is suggested. . Diffuse bony metastatic disease M, unchanged. Stable prominent paraesophageal lymph node, and partially imaged probable prominent subcarinal lymph node. CT thoracic spine 11.13.2015: Widespread osseous metastatic disease. No dominant lytic lesions or evidence of extraosseous tumor. No evidence of canal compromise  Antibiotics:  Vanc and zosyn until 11/14  levaquin 11/14  HPI/Subjective: Pain slightly bothering her; no fever. No nausea or vomiting. Poor appetite   Objective: Filed Vitals:   04/30/14 0749 04/30/14 1350 04/30/14 1931 04/30/14 2035  BP:  102/56  106/55  Pulse: 97 90  104  Temp:  98.3 F (36.8 C)  98.2 F (36.8 C)  TempSrc:  Oral  Oral  Resp: 18 18  18   Height:      Weight:      SpO2: 93% 99% 93% 99%    Intake/Output Summary (Last 24 hours) at 04/30/14 2137 Last data filed at 04/30/14 1905  Gross per 24 hour  Intake   1275 ml  Output   1200 ml  Net     75 ml   Filed Weights   04/28/14 0718 04/29/14 0557 04/29/14 2047  Weight: 51.9 kg (114 lb 6.7 oz) 51.6 kg (113 lb 12.1 oz) 52.9 kg (116 lb 10 oz)    Exam:  General: Alert, awake, oriented x3, in mild distress due to pain in her back; no fever. HEENT: No bruits, no goiter.  Heart: Regular rate and rhythm. Lungs: no wheezing; normal effort; scattered rhonchi and fair air movement  Abdomen: Soft, RUQ tender, positive bowel sounds.  Neuro: Grossly intact, nonfocal.   Data Reviewed: Basic Metabolic Panel:  Recent Labs Lab 04/26/14 0510 04/27/14 0520 04/28/14 0537 04/29/14 0741 04/30/14 0610  NA 136* 138 135* 140 143  K 3.0* 4.4 3.7 3.9 3.5*  CL 101 101 95* 101 104  CO2 21 22 24 25 26   GLUCOSE 135* 98 105* 114* 87  BUN 12 10 7  5* 5*  CREATININE 0.67 0.58 0.57 0.59 0.69  CALCIUM 8.9 9.9 9.7 10.4 11.2*   Liver Function Tests:  Recent Labs Lab 04/24/14 1653 04/25/14 0550 04/27/14 1602 04/28/14 0537  AST 49* 43*  --  37  ALT 8 8  --  7  ALKPHOS 101 108  --  94  BILITOT 0.4 0.3  --  0.3  PROT 5.7* 5.9* 6.4 6.1  ALBUMIN 2.1* 2.0* 2.1* 2.0*    Recent Labs Lab 04/24/14 2200  AMMONIA 19   CBC:  Recent Labs Lab 04/24/14 1653  04/25/14 0900 04/26/14 0510 04/27/14 0520 04/28/14 0537 04/29/14 0741 04/30/14 0610  WBC 15.0*  < > 15.7* 12.1* 12.8* 12.2* 12.5* 10.9*  NEUTROABS 12.1*  --  13.2*  --   --   --   --   --   HGB 8.5*  < > 8.4* 7.6* 9.2* 8.5* 8.0* 7.5*  HCT 26.0*  < > 25.3* 23.4* 29.1* 25.7* 24.6* 23.6*  MCV 91.2  < > 90.7 91.4 93.6 90.8 91.1 92.5  PLT 77*  < > 59* 52* 52* 41* 34* 28*  < > = values in this interval not  displayed. Cardiac Enzymes:  Recent Labs Lab 04/24/14 2057  TROPONINI <0.30   BNP (last 3 results)  Recent Labs  04/10/14 1537  PROBNP 298.3*   CBG: No results for input(s): GLUCAP in the last 168 hours.  Recent Results (from the past 240 hour(s))  Blood Culture (routine x 2)     Status: None   Collection Time: 04/24/14  4:53 PM  Result Value Ref Range Status   Specimen Description BLOOD LEFT HAND  Final   Special Requests BOTTLES DRAWN AEROBIC AND ANAEROBIC 10 ML  Final   Culture  Setup Time   Final    04/24/2014 22:39 Performed at [place]   Final    NO GROWTH 5 DAYS Performed at [facility]    Report Status 04/30/2014 FINAL  Final  Blood Culture (routine x 2)     Status: None   Collection  Time: 04/24/14  4:53 PM  Result Value Ref Range Status   Specimen Description BLOOD LEFT ANTECUBITAL  Final   Special Requests BOTTLES DRAWN AEROBIC AND ANAEROBIC  5 CC  Final   Culture  Setup Time   Final    04/24/2014 22:39 Performed at [facility]    Culture   Final    STAPHYLOCOCCUS SPECIES (COAGULASE NEGATIVE) Note: THE SIGNIFICANCE OF ISOLATING THIS ORGANISM FROM A SINGLE SET OF BLOOD CULTURES WHEN MULTIPLE SETS ARE DRAWN IS UNCERTAIN. PLEASE NOTIFY THE MICROBIOLOGY DEPARTMENT WITHIN ONE WEEK IF SPECIATION AND SENSITIVITIES ARE REQUIRED. Note: Gram Stain Report Called to,Read Back By and Verified With: DELISA COBB ON 04/25/2014 AT 10:45P BY WILEJ Performed at [facility]    Report Status 04/27/2014 FINAL  Final  Urine culture     Status: None   Collection Time: 04/24/14  5:02 PM  Result Value Ref Range Status   Specimen Description URINE, CLEAN CATCH  Final   Special Requests NONE  Final   Culture  Setup Time   Final    04/24/2014 23:08 Performed at [place]   Final    30,000 COLONIES/ML Performed at [facility]    Culture   Final    ENTEROCOCCUS SPECIES Performed at [facility]    Report Status 04/28/2014 FINAL  Final   Organism ID, Bacteria ENTEROCOCCUS SPECIES  Final      Susceptibility   Enterococcus species - MIC*    AMPICILLIN <=2 SENSITIVE Sensitive     LEVOFLOXACIN 4 INTERMEDIATE Intermediate     NITROFURANTOIN 32 SENSITIVE Sensitive     VANCOMYCIN <=0.5 SENSITIVE Sensitive     TETRACYCLINE <=1 SENSITIVE Sensitive     * ENTEROCOCCUS SPECIES  Clostridium Difficile by PCR     Status: None   Collection Time: 04/25/14  9:42 AM  Result Value Ref Range Status   C difficile by pcr NEGATIVE NEGATIVE Final    Comment: Performed at [facility]  Stool culture     Status: None   Collection Time: 04/25/14  9:42 AM  Result Value Ref Range Status   Specimen Description STOOL  Final   Special Requests NONE  Final   Culture   Final    NO SALMONELLA, SHIGELLA, CAMPYLOBACTER, YERSINIA, OR E.COLI 0157:H7 ISOLATED Performed at [facility]    Report Status 04/29/2014 FINAL  Final     Studies: No results found.  Scheduled Meds: . amLODipine  2.5 mg Oral Daily  . budesonide  0.5 mg Nebulization Q12H  . cholecalciferol  2,000 Units Oral Daily  . levofloxacin  750 mg Oral Daily  . lidocaine  1 patch Transdermal Q24H  . LORazepam  0.5 mg Oral 3 times per day  . pantoprazole  40 mg Oral Daily  . polyethylene glycol  17 g Oral BID   Continuous Infusions: . sodium chloride 75 mL/hr at 04/30/14 1443   Time: 30 minutes  Barton Dubois  [facility] Pager [phone]. If 8PM-8AM, please contact night-coverage at [email or web address], password [facility] 04/30/2014, 9:37 PM  LOS: 6 days

## 2014-04-30 NOTE — Plan of Care (Signed)
Problem: Phase I Progression Outcomes Goal: OOB as tolerated unless otherwise ordered Outcome: Progressing  Problem: Phase II Progression Outcomes Goal: Obtain order to discontinue catheter if appropriate Outcome: Completed/Met Date Met:  04/30/14  Problem: Phase III Progression Outcomes Goal: Voiding independently Outcome: Progressing Goal: Foley discontinued Outcome: Completed/Met Date Met:  04/30/14

## 2014-05-01 ENCOUNTER — Ambulatory Visit (HOSPITAL_COMMUNITY): Admission: RE | Admit: 2014-05-01 | Payer: Medicaid Other | Source: Ambulatory Visit

## 2014-05-01 ENCOUNTER — Ambulatory Visit: Payer: Medicaid Other | Admitting: Oncology

## 2014-05-01 ENCOUNTER — Ambulatory Visit: Payer: Medicaid Other

## 2014-05-01 ENCOUNTER — Other Ambulatory Visit: Payer: Medicaid Other

## 2014-05-01 DIAGNOSIS — E876 Hypokalemia: Secondary | ICD-10-CM

## 2014-05-01 LAB — BASIC METABOLIC PANEL
Anion gap: 14 (ref 5–15)
BUN: 4 mg/dL — ABNORMAL LOW (ref 6–23)
CO2: 24 meq/L (ref 19–32)
Calcium: 11 mg/dL — ABNORMAL HIGH (ref 8.4–10.5)
Chloride: 102 mEq/L (ref 96–112)
Creatinine, Ser: 0.68 mg/dL (ref 0.50–1.10)
GFR calc Af Amer: >90 mL/min (ref >90)
GFR calc non Af Amer: >90 mL/min (ref >90)
GLUCOSE: 91 mg/dL (ref 70–99)
POTASSIUM: 3.2 meq/L — AB (ref 3.7–5.3)
SODIUM: 140 meq/L (ref 137–147)

## 2014-05-01 LAB — CBC
HEMATOCRIT: 23.4 % — AB (ref 36.0–46.0)
Hemoglobin: 7.5 g/dL — ABNORMAL LOW (ref 12.0–15.0)
MCH: 30.2 pg (ref 26.0–34.0)
MCHC: 32.1 g/dL (ref 30.0–36.0)
MCV: 94.4 fL (ref 78.0–100.0)
Platelets: 26 10*3/uL — CL (ref 150–400)
RBC: 2.48 MIL/uL — ABNORMAL LOW (ref 3.87–5.11)
RDW: 19.4 % — ABNORMAL HIGH (ref 11.5–15.5)
WBC: 10.8 10*3/uL — AB (ref 4.0–10.5)

## 2014-05-01 LAB — URINALYSIS, ROUTINE W REFLEX MICROSCOPIC
Bilirubin Urine: NEGATIVE
GLUCOSE, UA: NEGATIVE mg/dL
HGB URINE DIPSTICK: NEGATIVE
Ketones, ur: NEGATIVE mg/dL
Leukocytes, UA: NEGATIVE
Nitrite: NEGATIVE
PH: 5.5 (ref 5.0–8.0)
PROTEIN: NEGATIVE mg/dL
SPECIFIC GRAVITY, URINE: 1.007 (ref 1.005–1.030)
Urobilinogen, UA: 0.2 mg/dL (ref 0.0–1.0)

## 2014-05-01 NOTE — Progress Notes (Signed)
[facility] PROGRESS NOTE Interim History: [name] is a 58 y.o. Female with metastatic breast cancer followed by Dr. Jana Hakim, last chemo on 03/27/14, COPD and hypertension who was admitted to [facility] from 10/26 through 11/4 for sepsis due to pneumonia and COPD exacerbation. Discharged on steroids and levofloxacin. She develop RUQ pain but developed recurrent high fevers with diarrhea (acute on chronic). On the night of admission and discovered that she was confused so they had her brought back to the ER. Was found to be septic without a source of infection.C. Diff was negative. Her temperature has clearly trended down on IV antibiotics, however.   Assessment/Plan: SIRS: - With Fever, tachypneic, leukocytosis and encephalopathic. - CT abd/pelvis showed dilated bile ducts, Stool culture negative, Cdiff PCR negative. lactic acid with in normal. - Started on empiric antibiotics vanc and zosyn. MRCP  abruptly tapering at the ampulla.  No choledocholithiasis is seen. Gi consulted they do feels that she will benefit from ERCP. Unlikely  Cholangitis. - remains afebrile  - Started on 11.9.2015 Vanc, Aztreonam, de-escalated to levaquin on 11/14. - BCX x2 negative till date,  UC negative - Bp stable, tachycardia and tachypnea resolved, no diarrhea.  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 Jana Hakim on Neoadjuvant chemo.  - last chemo on October. - Now with metastatic disease to her thoracici spine. - CT thoracic spine as below. Will D/w oncology for possible Radiation to spine. - Cont narcotics as needed; will adjust for better pain controlled. Patient and family advise possible mental status changes while increasing narcotics dose and increase risk for aspiration.  B/L pleural effusion: - Consult IR relates there is not enough fluid. -will monitor  Acute encephalopathy: - resolved. 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 Compliant on lovenox at home -heparin products on hold due to thrombocytopenia  COPD On 2L [place] at home, -No current respiratory distress or wheezing -Continue home pulmicort and xopenex -continue O2 [place] supplementation   HTN:  -well controlled -Continue Norvasc   Leukocytosis: -trending dow -no fever -continue current antibiotics  Normocytic anemia: -Due to chemotherapy. -Follow Hgb trend -no need for transfusion   Thrombocytopenia: - due to chemo - PLT's trending down. Stop heparin, will use SCD's. - no signs of bleeding.  GERD -continue PPI  protein calorie malnutrition severe -continue feeding supplements Patient encourage to increase PO intake  Code Status: full Family Communication: patient alone Disposition Plan: to be determine; waiting on oncology recommendations. Per PT/OT will benefit of SNF   Consultants:  GI  Procedures: Ct abd and pelvis 11.11.2015: Persist extrahepatic biliary ductal dilatation to 1.5 cm, with fairly abrupt transition to completely decompressed extrahepatic bile duct. Stable very mild intrahepatic biliary ductal dilatation. The ductal dilatation could be related to the postcholecystectomy state; however, the possibility of a distal ductal stricture or small ampullary of obstructing lesion is difficult to exclude. Correlation with liver function tests is suggested. . Diffuse bony metastatic disease M, unchanged. Stable prominent paraesophageal lymph node, and partially imaged probable prominent subcarinal lymph node. CT thoracic spine 11.13.2015: Widespread osseous metastatic disease. No dominant lytic lesions or evidence of extraosseous tumor. No evidence of canal compromise  Antibiotics:  Vanc and zosyn until 11/14  levaquin 11/14  HPI/Subjective: Afebrile; slight wheezing; no CP. Intermittent episodes of back pain. PT/OT recommending SNF    Objective: Filed Vitals:   04/30/14 1931  04/30/14 2035 05/01/14 0604 05/01/14 0800  BP:  106/55 142/69   Pulse:  104 118 112  Temp:  98.2 F (36.8 C) 98.2 F (36.8 C)   TempSrc:  Oral Oral   Resp:  18 22 26   Height:      Weight:      SpO2: 93% 99% 97% 87%    Intake/Output Summary (Last 24 hours) at 05/01/14 1254 Last data filed at 04/30/14 1905  Gross per 24 hour  Intake    795 ml  Output    200 ml  Net    595 ml   Filed Weights   04/28/14 0718 04/29/14 0557 04/29/14 2047  Weight: 51.9 kg (114 lb 6.7 oz) 51.6 kg (113 lb 12.1 oz) 52.9 kg (116 lb 10 oz)    Exam:  General: Alert, awake, oriented x3, pain is better controlled (but still with intermittent pain); no fever. HEENT: No bruits, no goiter. No JVD Heart: Regular rate and rhythm. No rubs or gallops Lungs: slight exp wheezing; normal effort; scattered rhonchi and fair air movement  Abdomen: Soft, RUQ tender, positive bowel sounds.  Neuro: Grossly intact, nonfocal.   Data Reviewed: Basic Metabolic Panel:  Recent Labs Lab 04/27/14 0520 04/28/14 0537 04/29/14 0741 04/30/14 0610 05/01/14 0435  NA 138 135* 140 143 140  K 4.4 3.7 3.9 3.5* 3.2*  CL 101 95* 101 104 102  CO2 22 24 25 26 24   GLUCOSE 98 105* 114* 87 91  BUN 10 7 5* 5* 4*  CREATININE 0.58 0.57 0.59 0.69 0.68  CALCIUM 9.9 9.7 10.4 11.2* 11.0*   Liver Function Tests:  Recent Labs Lab 04/24/14 1653 04/25/14 0550 04/27/14 1602 04/28/14 0537  AST 49* 43*  --  37  ALT 8 8  --  7  ALKPHOS 101 108  --  94  BILITOT 0.4 0.3  --  0.3  PROT 5.7* 5.9* 6.4 6.1  ALBUMIN 2.1* 2.0* 2.1* 2.0*    Recent Labs Lab 04/24/14 2200  AMMONIA 19   CBC:  Recent Labs Lab 04/24/14 1653  04/25/14 0900  04/27/14 0520 04/28/14 0537 04/29/14 0741 04/30/14 0610 05/01/14 0435  WBC 15.0*  < > 15.7*  < > 12.8* 12.2* 12.5* 10.9* 10.8*  NEUTROABS 12.1*  --  13.2*  --   --   --   --   --   --   HGB 8.5*  < > 8.4*  < > 9.2* 8.5* 8.0* 7.5* 7.5*  HCT 26.0*   < > 25.3*  < > 29.1* 25.7* 24.6* 23.6* 23.4*  MCV 91.2  < > 90.7  < > 93.6 90.8 91.1 92.5 94.4  PLT 77*  < > 59*  < > 52* 41* 34* 28* 26*  < > = values in this interval not displayed. Cardiac Enzymes:  Recent Labs Lab 04/24/14 2057  TROPONINI <0.30   BNP (last 3 results)  Recent Labs  04/10/14 1537  PROBNP 298.3*   CBG: No results for input(s): GLUCAP in the last 168 hours.  Recent Results (from the past 240 hour(s))  Blood Culture (routine x 2)     Status: None   Collection Time: 04/24/14  4:53 PM  Result Value Ref Range Status   Specimen Description BLOOD LEFT HAND  Final   Special Requests BOTTLES DRAWN AEROBIC AND ANAEROBIC 10 ML  Final   Culture  Setup Time   Final    04/24/2014 22:39 Performed at [facility]  Final    NO GROWTH 5 DAYS Performed at [facility]    Report Status 04/30/2014 FINAL  Final  Blood Culture (routine x 2)     Status: None   Collection Time: 04/24/14  4:53 PM  Result Value Ref Range Status   Specimen Description BLOOD LEFT ANTECUBITAL  Final   Special Requests BOTTLES DRAWN AEROBIC AND ANAEROBIC  5 CC  Final   Culture  Setup Time   Final    04/24/2014 22:39 Performed at [facility]    Culture   Final    STAPHYLOCOCCUS SPECIES (COAGULASE NEGATIVE) Note: THE SIGNIFICANCE OF ISOLATING THIS ORGANISM FROM A SINGLE SET OF BLOOD CULTURES WHEN MULTIPLE SETS ARE DRAWN IS UNCERTAIN. PLEASE NOTIFY THE MICROBIOLOGY DEPARTMENT WITHIN ONE WEEK IF SPECIATION AND SENSITIVITIES ARE REQUIRED. Note: Gram Stain Report Called to,Read Back By and Verified With: DELISA COBB ON 04/25/2014 AT 10:45P BY WILEJ Performed at [facility]    Report Status 04/27/2014 FINAL  Final  Urine culture     Status: None   Collection Time: 04/24/14  5:02 PM  Result Value Ref Range Status   Specimen Description URINE, CLEAN CATCH  Final   Special Requests NONE  Final   Culture  Setup Time   Final    04/24/2014 23:08 Performed  at [place]   Final    30,000 COLONIES/ML Performed at [facility]    Culture   Final    ENTEROCOCCUS SPECIES Performed at [facility]    Report Status 04/28/2014 FINAL  Final   Organism ID, Bacteria ENTEROCOCCUS SPECIES  Final      Susceptibility   Enterococcus species - MIC*    AMPICILLIN <=2 SENSITIVE Sensitive     LEVOFLOXACIN 4 INTERMEDIATE Intermediate     NITROFURANTOIN 32 SENSITIVE Sensitive     VANCOMYCIN <=0.5 SENSITIVE Sensitive     TETRACYCLINE <=1 SENSITIVE Sensitive     * ENTEROCOCCUS SPECIES  Clostridium Difficile by PCR     Status: None   Collection Time: 04/25/14  9:42 AM  Result Value Ref Range Status   C difficile by pcr NEGATIVE NEGATIVE Final    Comment: Performed at [facility]  Stool culture     Status: None   Collection Time: 04/25/14  9:42 AM  Result Value Ref Range Status   Specimen Description STOOL  Final   Special Requests NONE  Final   Culture   Final    NO SALMONELLA, SHIGELLA, CAMPYLOBACTER, YERSINIA, OR E.COLI 0157:H7 ISOLATED Performed at [facility]    Report Status 04/29/2014 FINAL  Final     Studies: No results found.  Scheduled Meds: . amLODipine  2.5 mg Oral Daily  . budesonide  0.5 mg Nebulization Q12H  . cholecalciferol  2,000 Units Oral Daily  . feeding supplement (ENSURE COMPLETE)  237 mL Oral BID BM  . levofloxacin  750 mg Oral Daily  . lidocaine  1 patch Transdermal Q24H  . LORazepam  0.5 mg Oral 3 times per day  . pantoprazole  40 mg Oral Daily  . polyethylene glycol  17 g Oral BID   Continuous Infusions: . sodium chloride 50 mL/hr at 05/01/14 4818   Time: 25 minutes  Barton Dubois  [facility] Pager [phone]. If 8PM-8AM, please contact night-coverage at [email or web address], password [facility] 05/01/2014, 12:54 PM  LOS: 7 days

## 2014-05-01 NOTE — Care Management Note (Signed)
CARE MANAGEMENT NOTE 05/01/2014  Patient:  [name], [name]   Account Number:  [id number]  Date Initiated:  04/25/2014  Documentation initiated by:  DAVIS,RHONDA  Subjective/Objective Assessment:   pt readmitted due to hcap and failed out patient treatment     Action/Plan:   SNF   Anticipated DC Date:  05/03/2014   Anticipated DC Plan:  [place]  In-house referral  NA      [place]  CM consult  [facility]      [facility] Choice  Resumption Of Svcs/PTA Provider   Choice offered to / List presented to:  NA   DME arranged  [facility]      DME agency  [place] arranged  [place] RN      [place].   Status of service:  In process, will continue to follow Medicare Important Message given?   (If response is "NO", the following Medicare IM given date fields will be blank) Date Medicare IM given:   Medicare IM given by:   Date Additional Medicare IM given:   Additional Medicare IM given by:    Discharge Disposition:  [place]  Per UR Regulation:  Reviewed for med. necessity/level of care/duration of stay  If discussed at [place] of Stay Meetings, dates discussed:    Comments:  05/01/14 Marney Doctor RN,BSN,NCM Spoke with pt and daughter [name] about DC plans.  Daughter does not want pt to go to [facility] due to it being too far.  She states that she will bring her home and there will be family with her around the clock. Daughter states [facility] RN and SW were coming to her house and she was receiving 02 from [facility].  She would like to continue those services at DC.  Will need DC orders from MD.  Also left private home care list in room for [facility] in case needed.  NCM continuing to follow for DC needs.  04/27/14 10:30 CM spoke with pt in room with CSW, [name]. Pt is  agreeable to going to SNF for rehab after hospitalization and  states she feels it  would be a good idea to speak with her daughter, [name] for discharge needs. CM called [name] and [name] states she feels SNF is a good idea. [name] states application for disability has been apprived to begin January of 2106.   CM handed the phone over to CSW to begin arrangements.  No other CM needs were communicated.  [name], BSN, [place].  [id number] Diana Hoes, RN, BSN, CCM Chart reviewed. Discharge needs and patient's stay to be reviewed and followed by case manager. per previous admissions patient has been referred to the wellness outpt clinic.

## 2014-05-01 NOTE — Progress Notes (Signed)
Occupational Therapy Treatment Patient Details Name: [name] MRN: [id number] DOB: 08-08-1955 Today's Date: 05/01/2014    History of present illness Pt with recent admission from 10/26-11/4 for Sepsis, encephalopathy, and pneumonia/respiratory failure. Admitted 04/24/14 for diarrhea and fever.    OT comments  Pt participated in bathing/toileting task this am. No BSC in room so used bedpan underneath pt in sitting on EOB. Will look to obtain [facility] for pt room and informed nursing that pivot to [facility] will help keep her moving for short durations of activity to work on increasing her activity tolerance. Pt fatigues after bathing at EOB for limited time. Encouraged PLB. Appears pt is agreeable to SNF and feel pt will benefit from SNF rehab.   Follow Up Recommendations  SNF;Supervision/Assistance - 24 hour    Equipment Recommendations  None recommended by OT    Recommendations for Other Services      Precautions / Restrictions Precautions Precautions: Fall Precaution Comments: requires O2/monitor sats and HR Restrictions Weight Bearing Restrictions: No       Mobility Bed Mobility Overal bed mobility: Needs Assistance Bed Mobility: Supine to Sit     Supine to sit: Supervision     General bed mobility comments: increased time.  Transfers Overall transfer level: Needs assistance Equipment used: None Transfers: Sit to/from Stand Sit to Stand: Min assist         General transfer comment: pt with difficulty rising from bed this am using rail. Min assist to fully attain standing for washing periareas.    Balance                                   ADL           Upper Body Bathing: Set up;Sitting   Lower Body Bathing: Minimal assistance;Sit to/from stand                         General ADL Comments: Pt on bedpan when OT arrived. Assisted pt to wash at EOB and change gown. Pt with urgency to urinate so placed bedpan under pt sitting at side of  bed (no BSC in room). Pt stood to wash periareas but fatigues quickly and becomes SOB. Encouraged PLB. Pt requring small rest breaks during task. Pt fatigued after bathing and toileting task.       Vision                     Perception     Praxis      Cognition   Behavior During Therapy: WFL for tasks assessed/performed Overall Cognitive Status: Within Functional Limits for tasks assessed                       Extremity/Trunk Assessment               Exercises     Shoulder Instructions       General Comments      Pertinent Vitals/ Pain       Pain Assessment: 0-10 Pain Score: 9  Pain Location: R lower back. Pain Descriptors / Indicators: Aching Pain Intervention(s): Patient requesting pain meds-RN notified;Repositioned  Home Living  Prior Functioning/Environment              Frequency Min 2X/week     Progress Toward Goals  OT Goals(current goals can now be found in the care plan section)  Progress towards OT goals: Progressing toward goals (still fatigues)     Plan Discharge plan remains appropriate    Co-evaluation                 End of Session Equipment Utilized During Treatment: Rolling walker;Oxygen   Activity Tolerance Patient limited by fatigue   Patient Left in bed;with call bell/phone within reach;with bed alarm set   Nurse Communication          Time: 9937-1696 OT Time Calculation (min): 25 min  Charges: OT General Charges $OT Visit: 1 Procedure OT Treatments $Self Care/Home Management : 8-22 mins $Therapeutic Activity: 8-22 mins  Jules Schick  [phone] 05/01/2014, 9:46 AM

## 2014-05-01 NOTE — Clinical Documentation Improvement (Signed)
Urine culture results reported on 04/28/14 as below.  Please document any clinical conditions associated, if any, with the culture results in your progress note and carry over to the discharge summary.    Component     Latest Ref Rng 04/24/2014         5:02 PM  Specimen Description      URINE, CLEAN CATCH  Special Requests      NONE  Culture  Setup Time      04/24/2014 23:08 . . .  Colony Count      30,000 COLONIES/ML . . .  Culture      ENTEROCOCCUS SPECIES . . .  Report Status      04/28/2014 FINAL  Organism ID, Bacteria      ENTEROCOCCUS SPECIES   Possible Clinical Conditions: -Enterococcus UTI -Other UTI -Other condition (please specify) -Unable to determine at present  Thank you, Mateo Flow, RN [phone] Clinical Documentation Specialist

## 2014-05-01 NOTE — Progress Notes (Signed)
CSW continuing to follow for disposition needs.  Pt continues to not have any SNF bed offers and only [facility] and Rehab considering. Per discussion with pt daughter, [name] on Friday 11/13, [place] would be too far of a distance for placement and pt family preferred for pt to return home and pt daughter planned to discuss with pt family to arrange shifts for pt care at home.  CSW reviewed SNF bed offers this morning and same remains that [facility] is only facility considering.   CSW and RNCM spoke with pt at bedside. Pt confirmed that pt family is going to assist with pt care at home. Pt confirmed that she did not want to go as far as [place] for placement. Pt agreeable to home health services in the home. Pt provided permission for CSW and RNCM to contact pt daughter, [name] to discuss.  CSW contacted pt daughter, [name] via telephone. CSW discussed that no further options available as far as SNF locally at this time. Pt daughter stated that she has discussed with pt siblings and they are able to assist pt daughter and pt granddaughter with pt care at home. CSW discussed with pt daughter that pt daughter has done everything appropriately as far as applying for Medicaid and disability, but facility options limited as pt disability checks do not begin until January 2016. Pt daughter expressed understanding and feels that pt will be in better spirits at home with assistance from family and home health services.  RNCM to communicate with pt daughter regarding home health services as pt deferred decision to pt daughter.  CSW to continue to follow to provide support, but pt plans to discharge home with home health services and 24 hour family assist.   Alison Murray, MSW, [place] Work [phone]

## 2014-05-01 NOTE — Progress Notes (Signed)
INITIAL NUTRITION ASSESSMENT  DOCUMENTATION CODES Per approved criteria  -Not Applicable   INTERVENTION: Ensure Complete po BID, each supplement provides 350 kcal and 13 grams of protein Regular diet Encouraged intake Encouraged use of supplements after d/c RD to follow  NUTRITION DIAGNOSIS: Inadequate oral intake related to poor appeitte as evidenced by documented poor po..   Goal: Intake of meals and supplements to meet >90% estimated needs.  Monitor:  Intake, labs, weight trend.  Reason for Assessment: Length of stay  58 y.o. female  Admitting Dx: <principal problem not specified>  ASSESSMENT: Patient with metastatic breast cancer admitted with pneumonia.  Patient with recent hospital admit over the last 3 weeks.    Intake very good until this admit. Drinks Ensure and likes this Appetite decreased.   Height: Ht Readings from Last 1 Encounters:  04/24/14 4\' 11"  (1.499 m)    Weight: Wt Readings from Last 1 Encounters:  04/29/14 116 lb 10 oz (52.9 kg)    Ideal Body Weight: 98 lbs  % Ideal Body Weight: 118  Wt Readings from Last 10 Encounters:  04/29/14 116 lb 10 oz (52.9 kg)  04/15/14 115 lb 1.3 oz (52.2 kg)  04/05/14 115 lb 8 oz (52.39 kg)  03/27/14 116 lb 12.8 oz (52.98 kg)  03/20/14 118 lb 9.6 oz (53.797 kg)  03/06/14 123 lb 6.4 oz (55.974 kg)  02/27/14 124 lb 9.6 oz (56.518 kg)  02/27/14 122 lb (55.339 kg)  02/27/14 123 lb 4.8 oz (55.929 kg)  02/13/14 115 lb 3.2 oz (52.254 kg)    Usual Body Weight: 115-124 lbs  % Usual Body Weight: 100  BMI:  Body mass index is 23.54 kg/(m^2).  Estimated Nutritional Needs: Kcal: 1600-1800 Protein: 70-80 gm Fluid: >/=1.6L daily  Skin: WDL  Diet Order: Diet regular  EDUCATION NEEDS: -Education needs addressed   Intake/Output Summary (Last 24 hours) at 05/01/14 1601 Last data filed at 05/01/14 1413  Gross per 24 hour  Intake 603.33 ml  Output    200 ml  Net 403.33 ml     Labs:   Recent  Labs Lab 04/29/14 0741 04/30/14 0610 05/01/14 0435  NA 140 143 140  K 3.9 3.5* 3.2*  CL 101 104 102  CO2 25 26 24   BUN 5* 5* 4*  CREATININE 0.59 0.69 0.68  CALCIUM 10.4 11.2* 11.0*  GLUCOSE 114* 87 91    CBG (last 3)  No results for input(s): GLUCAP in the last 72 hours.  Scheduled Meds: . amLODipine  2.5 mg Oral Daily  . budesonide  0.5 mg Nebulization Q12H  . cholecalciferol  2,000 Units Oral Daily  . feeding supplement (ENSURE COMPLETE)  237 mL Oral BID BM  . levofloxacin  750 mg Oral Daily  . lidocaine  1 patch Transdermal Q24H  . LORazepam  0.5 mg Oral 3 times per day  . pantoprazole  40 mg Oral Daily  . polyethylene glycol  17 g Oral BID    Continuous Infusions: . sodium chloride 50 mL/hr at 05/01/14 0433    Past Medical History  Diagnosis Date  . Acid reflux   . Asthma   . COPD (chronic obstructive pulmonary disease)   . Cancer     lumpectomy, radiation 2009  . Seasonal allergies   . Alcohol abuse   . Elevated d-dimer     negative chest CT  . Allergy   . Emphysema of lung   . Hot flashes   . Reading disorder   . Impaired writing  skills   . Wears glasses   . Wears dentures     top  . HOH (hard of hearing)     left ear  . S/P radiation therapy 01/13/14    SRS left parietal 20Gy    Past Surgical History  Procedure Laterality Date  . Breast lumpectomy    . Abdominal hysterectomy    . Portacath placement Left 12/30/2013    Procedure: INSERTION PORT-A-CATH;  Surgeon: Merrie Roof, MD;  Location: [place];  Service: General;  Laterality: Left;  subclavian area    Antonieta Iba, Ray, LDN Clinical Inpatient Dietitian Pager:  [phone] Weekend and after hours pager:  [phone]

## 2014-05-01 NOTE — Progress Notes (Signed)
Physical Therapy Treatment Patient Details Name: [name] MRN: [id number] DOB: [birth year] Today's Date: 05/01/2014    History of Present Illness Pt with recent admission from 10/26-11/4 for Sepsis, encephalopathy, and pneumonia/respiratory failure. Admitted 04/24/14 for diarrhea and fever.     PT Comments    Assisted pt OOB to amb to BR with increased time on 3 lts O2 sats decreased to 74% and HR increased to 147.  RN notified.  Assisted with hygiene as pt was very fatigued then back to bed.    Follow Up Recommendations  SNF;Supervision/Assistance - 24 hour (pending INS)     Equipment Recommendations       Recommendations for Other Services       Precautions / Restrictions      Mobility  Bed Mobility Overal bed mobility: Needs Assistance Bed Mobility: Supine to Sit;Sit to Supine     Supine to sit: Min guard;Min assist Sit to supine: Min guard;Min assist   General bed mobility comments: increased time.  Transfers Overall transfer level: Needs assistance Equipment used: Rolling walker (2 wheeled) Transfers: Sit to/from Stand Sit to Stand: Min guard;Min assist         General transfer comment: increased time and required elevated bed/commode.    Ambulation/Gait Ambulation/Gait assistance: Min guard Ambulation Distance (Feet): 36 Feet (18 feet x 2 to and from BR) Assistive device: Rolling walker (2 wheeled) Gait Pattern/deviations: Step-through pattern;Decreased stride length;Trunk flexed Gait velocity: decreased   General Gait Details: limited activity tolerance and fatigues quickly.  On 3 lts nasal and demon 4/4 DOE.   Stairs            Wheelchair Mobility    Modified Rankin (Stroke Patients Only)       Balance                                    Cognition                            Exercises      General Comments        Pertinent Vitals/Pain Pain Assessment: 0-10    Home Living                       Prior Function            PT Goals (current goals can now be found in the care plan section) Progress towards PT goals: Progressing toward goals    Frequency  Min 3X/week    PT Plan      Co-evaluation             End of Session Equipment Utilized During Treatment: Gait belt;Oxygen Activity Tolerance: Patient limited by fatigue Patient left: in bed;with call bell/phone within reach     Time: 1520-1540 PT Time Calculation (min) (ACUTE ONLY): 20 min  Charges:  $Gait Training: 8-22 mins                    G Codes:      Rica Koyanagi  PTA [facility]  Acute  Rehab Pager      [phone]

## 2014-05-02 DIAGNOSIS — C7931 Secondary malignant neoplasm of brain: Secondary | ICD-10-CM

## 2014-05-02 DIAGNOSIS — M549 Dorsalgia, unspecified: Secondary | ICD-10-CM | POA: Insufficient documentation

## 2014-05-02 DIAGNOSIS — Z5111 Encounter for antineoplastic chemotherapy: Secondary | ICD-10-CM

## 2014-05-02 DIAGNOSIS — C773 Secondary and unspecified malignant neoplasm of axilla and upper limb lymph nodes: Secondary | ICD-10-CM

## 2014-05-02 DIAGNOSIS — C7951 Secondary malignant neoplasm of bone: Secondary | ICD-10-CM

## 2014-05-02 DIAGNOSIS — M546 Pain in thoracic spine: Secondary | ICD-10-CM

## 2014-05-02 DIAGNOSIS — C50811 Malignant neoplasm of overlapping sites of right female breast: Secondary | ICD-10-CM

## 2014-05-02 LAB — BASIC METABOLIC PANEL
ANION GAP: 21 — AB (ref 5–15)
BUN: 3 mg/dL — ABNORMAL LOW (ref 6–23)
CHLORIDE: 101 meq/L (ref 96–112)
CO2: 22 mEq/L (ref 19–32)
CREATININE: 0.65 mg/dL (ref 0.50–1.10)
Calcium: 10.9 mg/dL — ABNORMAL HIGH (ref 8.4–10.5)
GFR calc Af Amer: >90 mL/min (ref >90)
GFR calc non Af Amer: >90 mL/min (ref >90)
Glucose, Bld: 89 mg/dL (ref 70–99)
Potassium: 3.2 mEq/L — ABNORMAL LOW (ref 3.7–5.3)
Sodium: 144 mEq/L (ref 137–147)

## 2014-05-02 LAB — URINE CULTURE
COLONY COUNT: NO GROWTH
Culture: NO GROWTH
Special Requests: NORMAL

## 2014-05-02 MED ORDER — DOXYCYCLINE HYCLATE 100 MG PO TABS
100.0000 mg | ORAL_TABLET | Freq: Two times a day (BID) | ORAL | Status: DC
  Administered 2014-05-02 – 2014-05-03 (×3): 100 mg via ORAL
  Filled 2014-05-02 (×4): qty 1

## 2014-05-02 MED ORDER — VITAMINS A & D EX OINT
TOPICAL_OINTMENT | CUTANEOUS | Status: AC
  Filled 2014-05-02: qty 5

## 2014-05-02 MED ORDER — DILTIAZEM HCL ER COATED BEADS 120 MG PO CP24
120.0000 mg | ORAL_CAPSULE | Freq: Every day | ORAL | Status: DC
  Administered 2014-05-03: 120 mg via ORAL
  Filled 2014-05-02: qty 1

## 2014-05-02 NOTE — Progress Notes (Signed)
CSW continuing to follow.  CSW received phone call from pt daughter, [name]. Pt daughter very emotional at this time. Pt daughter discussed with CSW that she wants to potentially consider the rehab facility. CSW provided emotional support as pt daughter discussed that she is concerned about pt as pt is "not herself" and pt daughter expressed that she just wants her mother to have the best care possible. CSW expressed understanding and discussed with pt daughter that CSW can speak with [facility] and Rehab and see if facility will be able to offer pt a bed as they were considering, but needed to review pt discharge medications before making determination. Pt daughter agreeable and stated that she planned to research the facility.  CSW spoke to [facility] and Rehab and facility willing to review pt medications to make decision. CSW contacted MD and requested MD to reconcile discharge meds for CSW to send to [place] to determine if facility can offer a bed.   CSW then received phone call from pt daughter, [name] stating that she researched [facility] and was not pleased with the reviews of the facility and has determined that pt coming home with assistance from family and home health will be best option for pt. Pt daughter expressed great appreciation for CSW and the assistance and support throughout hospitalization.  CSW updated MD that pt daughter final decision is for pt to return home with family care and home health services.   CSW updated RNCM.   CSW to continue to follow to provide support.   Alison Murray, MSW, [place] Work [phone]

## 2014-05-02 NOTE — Progress Notes (Signed)
COURTESY NOTE:  [name] remains severely anemic but is no longer febrile.  We will resume chemotherapy with eribulin as outpatient: next appointment with [place] is 05/08/2014.   BREAST CANCER TREATMENT HISTORY: 58 y.o. [name] with stage IV breast cancer involving brain, lung, and bones, readmitted  04/24/2014 with diarrhea, confusion and fever; her breast cancer history is as follows:  (1) status post right breast biopsy of 2 separate masses in the right axillary lymph node 12/21/2013 showing a clinical T3 N3, stage IIIC invasive ductal carcinoma, triple negative, with an MIB-1 of 85%; staging studies 01/04/2014 show metastatic disease to the brain, lung, and bones.   (2) status post stereotactic radiosurgery to a 1.7 cm left parietal lesion: 20 Gy given 01/13/2014   (a) repeat brain MRI11/04/2014 shows continuing response  (3) systemic therapy consists of neoadjuvant eribulin, given days 1 and 8 of each 21 day cycle, started 01/17/2014   (a) stopped 03/27/2014 (after 4 cycles) because of intercurrent admissions  (b) CT scans this and previous admission show evidence of response   (4) zolendronate to be started once the patient's dental condition has been optimized. I am arranging for her to see a dentist.   (5) right mastectomy for local control to be considered once systemic disease comes under good control   (6) also status post left lumpectomy and sentinel lymph node sampling 11/23/2007 for a pTis pN0, stage 0 ductal carcinoma in situ, high-grade, estrogen and progesterone receptor negative, status post adjuvant radiation  Appreciate hospitalist's care to this patient. Please let me know if I can be of further help

## 2014-05-02 NOTE — Plan of Care (Signed)
Problem: Phase I Progression Outcomes Goal: Initial discharge plan identified Outcome: Completed/Met Date Met:  05/02/14     

## 2014-05-02 NOTE — Progress Notes (Signed)
[facility] PROGRESS NOTE Interim History: Pat Elicker is a 58 y.o. Female with metastatic breast cancer followed by Dr. Jana Hakim, last chemo on 03/27/14, COPD and hypertension who was admitted to [facility] from 10/26 through 11/4 for sepsis due to pneumonia and COPD exacerbation. Discharged on steroids and levofloxacin. She develop RUQ pain but developed recurrent high fevers with diarrhea (acute on chronic). On the night of admission and discovered that she was confused so they had her brought back to the ER. Was found to be septic without a source of infection.C. Diff was negative. Her temperature has clearly trended down on IV antibiotics, however. Urine cx demonstrating enterococcus infection. Will treat with 5 more days of doxycycline. Patient still with very deconditioned status; tachycardic and tachypenic. Will try to improve these symptoms before discharging home with [facility] services.   Assessment/Plan: SIRS: appears to be secondary to enterococcus infection  - With Fever, tachypneic, leukocytosis and encephalopathic. - CT abd/pelvis showed dilated bile ducts, Stool culture negative, Cdiff PCR negative. lactic acid with in normal. - Started on empiric antibiotics vanc and zosyn. MRCP  abruptly tapering at the ampulla.  No choledocholithiasis is seen. Gi consulted they do feels that she will benefit from ERCP. Unlikely  Cholangitis. - remains afebrile and WBC's just slightly elevated - Started on 11.9.2015 Vanc, Aztreonam, de-escalated to levaquin on 11/14; enterococcus cx showing intermediate resistance to levaquin; will complete 5 days of doxycycline following cx's/sensitivity  - BCX x2 negative till date - Bp stable to high now -tachycardia and tachypnea better but still present -no further diarrhea. 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 Jana Hakim on  Neoadjuvant chemo.  - last chemo on October. - Now with metastatic disease to her thoracici spine. - CT thoracic spine as below. Will D/w oncology for possible Radiation to spine. - Cont narcotics as needed; will adjust for better pain controlled. Patient and family advise possible mental status changes while increasing narcotics dose and increase risk for aspiration.  B/L pleural effusion: - Consult IR relates there is not enough fluid for thoracentesis at this moment. -will monitor  Acute encephalopathy: - resolved. 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 Compliant on lovenox at home -heparin products on hold due to thrombocytopenia at this moment -level is 19 (oncology to guide on need of transfusion) -no signs of bleeding  COPD On 2L [place] at home, -No current respiratory distress or wheezing -Continue home pulmicort and xopenex -continue O2 [place] supplementation   HTN:  -stable to rising -norvasc change to cardizem for better controlled  Leukocytosis: -no fever -continue current antibiotics as mentioned above -will follow trend with CBC  Normocytic anemia: -Due to chemotherapy most likely. -Follow Hgb trend -no need for transfusion at this moemnt (has remained stable)   Thrombocytopenia: - due to chemo - PLT's trending down. Stop heparin, will use SCD's. - no signs of bleeding.  GERD -continue PPI  protein calorie malnutrition severe -continue feeding supplements -Patient encourage to increase PO intake  Code Status: full Family Communication: patient and daughter at bedside (on 11/16); on 11/17 just patient Disposition Plan: to be determine; waiting on oncology recommendations on thrombocytopenia. Per PT/OT will benefit of SNF; family wants home with [facility] services. Ok to d/c in 24-48 hours   Consultants:  GI  Procedures: Ct abd  and pelvis 11.11.2015: Persist extrahepatic biliary ductal dilatation to 1.5  cm, with fairly abrupt transition to completely decompressed extrahepatic bile duct. Stable very mild intrahepatic biliary ductal dilatation. The ductal dilatation could be related to the postcholecystectomy state; however, the possibility of a distal ductal stricture or small ampullary of obstructing lesion is difficult to exclude. Correlation with liver function tests is suggested. . Diffuse bony metastatic disease M, unchanged. Stable prominent paraesophageal lymph node, and partially imaged probable prominent subcarinal lymph node. CT thoracic spine 11.13.2015: Widespread osseous metastatic disease. No dominant lytic lesions or evidence of extraosseous tumor. No evidence of canal compromise  Antibiotics:  Vanc and zosyn until 11/14  levaquin 11/14  HPI/Subjective: Afebrile; no CP, patient slightly tachypneic and tachycardic; good O2 sat on chronic supplementation.  Objective: Filed Vitals:   05/01/14 2048 05/02/14 0537 05/02/14 0921 05/02/14 1310  BP: 133/68 143/71  146/71  Pulse: 125 127 124 104  Temp: 98.7 F (37.1 C) 98.7 F (37.1 C)  98.3 F (36.8 C)  TempSrc: Oral Oral  Oral  Resp: 18 16 18 16   Height:      Weight:      SpO2: 91% 94%  96%    Intake/Output Summary (Last 24 hours) at 05/02/14 1743 Last data filed at 05/02/14 0852  Gross per 24 hour  Intake 549.17 ml  Output    665 ml  Net -115.83 ml   Filed Weights   04/28/14 0718 04/29/14 0557 04/29/14 2047  Weight: 51.9 kg (114 lb 6.7 oz) 51.6 kg (113 lb 12.1 oz) 52.9 kg (116 lb 10 oz)    Exam:  General: Alert, awake, oriented x3, pain is better controlled (but still with intermittent pain on back and sides); no fever. HEENT: No bruits, no goiter. No JVD Heart: tachycardia. No rubs or gallops Lungs: scattered rhonchi, no wheezing fair air movement; positive tachypnea Abdomen: Soft, RUQ tender, positive bowel sounds.  Neuro: Grossly intact, nonfocal.   Data Reviewed: Basic Metabolic Panel:  Recent  Labs Lab 04/28/14 0537 04/29/14 0741 04/30/14 0610 05/01/14 0435 05/02/14 0545  NA 135* 140 143 140 144  K 3.7 3.9 3.5* 3.2* 3.2*  CL 95* 101 104 102 101  CO2 24 25 26 24 22   GLUCOSE 105* 114* 87 91 89  BUN 7 5* 5* 4* 3*  CREATININE 0.57 0.59 0.69 0.68 0.65  CALCIUM 9.7 10.4 11.2* 11.0* 10.9*   Liver Function Tests:  Recent Labs Lab 04/27/14 1602 04/28/14 0537  AST  --  37  ALT  --  7  ALKPHOS  --  94  BILITOT  --  0.3  PROT 6.4 6.1  ALBUMIN 2.1* 2.0*   CBC:  Recent Labs Lab 04/28/14 0537 04/29/14 0741 04/30/14 0610 05/01/14 0435 05/02/14 0545  WBC 12.2* 12.5* 10.9* 10.8* 12.3*  HGB 8.5* 8.0* 7.5* 7.5* 7.5*  HCT 25.7* 24.6* 23.6* 23.4* 23.4*  MCV 90.8 91.1 92.5 94.4 91.8  PLT 41* 34* 28* 26* 19*   BNP (last 3 results)  Recent Labs  04/10/14 1537  PROBNP 298.3*   CBG: No results for input(s): GLUCAP in the last 168 hours.  Recent Results (from the past 240 hour(s))  Blood Culture (routine x 2)     Status: None   Collection Time: 04/24/14  4:53 PM  Result Value Ref Range Status   Specimen Description BLOOD LEFT HAND  Final   Special Requests BOTTLES DRAWN AEROBIC AND ANAEROBIC 10 ML  Final   Culture  Setup Time  Final    04/24/2014 22:39 Performed at [place]   Final    NO GROWTH 5 DAYS Performed at [facility]    Report Status 04/30/2014 FINAL  Final  Blood Culture (routine x 2)     Status: None   Collection Time: 04/24/14  4:53 PM  Result Value Ref Range Status   Specimen Description BLOOD LEFT ANTECUBITAL  Final   Special Requests BOTTLES DRAWN AEROBIC AND ANAEROBIC  5 CC  Final   Culture  Setup Time   Final    04/24/2014 22:39 Performed at [facility]    Culture   Final    STAPHYLOCOCCUS SPECIES (COAGULASE NEGATIVE) Note: THE SIGNIFICANCE OF ISOLATING THIS ORGANISM FROM A SINGLE SET OF BLOOD CULTURES WHEN MULTIPLE SETS ARE DRAWN IS UNCERTAIN. PLEASE NOTIFY THE MICROBIOLOGY DEPARTMENT WITHIN ONE  WEEK IF SPECIATION AND SENSITIVITIES ARE REQUIRED. Note: Gram Stain Report Called to,Read Back By and Verified With: DELISA COBB ON 04/25/2014 AT 10:45P BY WILEJ Performed at [facility]    Report Status 04/27/2014 FINAL  Final  Urine culture     Status: None   Collection Time: 04/24/14  5:02 PM  Result Value Ref Range Status   Specimen Description URINE, CLEAN CATCH  Final   Special Requests NONE  Final   Culture  Setup Time   Final    04/24/2014 23:08 Performed at [place]   Final    30,000 COLONIES/ML Performed at [facility]    Culture   Final    ENTEROCOCCUS SPECIES Performed at [facility]    Report Status 04/28/2014 FINAL  Final   Organism ID, Bacteria ENTEROCOCCUS SPECIES  Final      Susceptibility   Enterococcus species - MIC*    AMPICILLIN <=2 SENSITIVE Sensitive     LEVOFLOXACIN 4 INTERMEDIATE Intermediate     NITROFURANTOIN 32 SENSITIVE Sensitive     VANCOMYCIN <=0.5 SENSITIVE Sensitive     TETRACYCLINE <=1 SENSITIVE Sensitive     * ENTEROCOCCUS SPECIES  Clostridium Difficile by PCR     Status: None   Collection Time: 04/25/14  9:42 AM  Result Value Ref Range Status   C difficile by pcr NEGATIVE NEGATIVE Final    Comment: Performed at [facility]  Stool culture     Status: None   Collection Time: 04/25/14  9:42 AM  Result Value Ref Range Status   Specimen Description STOOL  Final   Special Requests NONE  Final   Culture   Final    NO SALMONELLA, SHIGELLA, CAMPYLOBACTER, YERSINIA, OR E.COLI 0157:[id number] ISOLATED Performed at [facility]    Report Status 04/29/2014 FINAL  Final     Studies: No results found.  Scheduled Meds: . budesonide  0.5 mg Nebulization Q12H  . cholecalciferol  2,000 Units Oral Daily  . [START ON 05/03/2014] diltiazem  120 mg Oral Daily  . doxycycline  100 mg Oral Q12H  . feeding supplement (ENSURE COMPLETE)  237 mL Oral BID BM  . lidocaine  1 patch Transdermal  Q24H  . LORazepam  0.5 mg Oral 3 times per day  . pantoprazole  40 mg Oral Daily  . polyethylene glycol  17 g Oral BID  . vitamin A & D       Continuous Infusions: . sodium chloride 50 mL/hr at 05/01/14 2345   Time: 25 minutes  Barton Dubois  [facility] Pager [phone].  If 8PM-8AM, please contact night-coverage at [email or web address], password [facility] 05/02/2014, 5:43 PM  LOS: 8 days

## 2014-05-03 LAB — BASIC METABOLIC PANEL
Anion gap: 15 (ref 5–15)
BUN: 6 mg/dL (ref 6–23)
CALCIUM: 11.5 mg/dL — AB (ref 8.4–10.5)
CO2: 27 mEq/L (ref 19–32)
CREATININE: 0.64 mg/dL (ref 0.50–1.10)
Chloride: 101 mEq/L (ref 96–112)
GLUCOSE: 120 mg/dL — AB (ref 70–99)
Potassium: 2.9 mEq/L — CL (ref 3.7–5.3)
Sodium: 143 mEq/L (ref 137–147)

## 2014-05-03 LAB — MAGNESIUM: Magnesium: 1.3 mg/dL — ABNORMAL LOW (ref 1.5–2.5)

## 2014-05-03 LAB — CBC
HCT: 22.6 % — ABNORMAL LOW (ref 36.0–46.0)
HEMATOCRIT: 23.4 % — AB (ref 36.0–46.0)
Hemoglobin: 7.5 g/dL — ABNORMAL LOW (ref 12.0–15.0)
Hemoglobin: 7.5 g/dL — ABNORMAL LOW (ref 12.0–15.0)
MCH: 29.4 pg (ref 26.0–34.0)
MCH: 30.4 pg (ref 26.0–34.0)
MCHC: 32.1 g/dL (ref 30.0–36.0)
MCHC: 33.2 g/dL (ref 30.0–36.0)
MCV: 91.8 fL (ref 78.0–100.0)
MCV: 91.5 fL (ref 78.0–100.0)
PLATELETS: 18 10*3/uL — AB (ref 150–400)
Platelets: 19 10*3/uL — CL (ref 150–400)
RBC: 2.55 MIL/uL — AB (ref 3.87–5.11)
RBC: 2.47 MIL/uL — ABNORMAL LOW (ref 3.87–5.11)
RDW: 19.8 % — ABNORMAL HIGH (ref 11.5–15.5)
RDW: 20 % — ABNORMAL HIGH (ref 11.5–15.5)
WBC: 12.3 10*3/uL — ABNORMAL HIGH (ref 4.0–10.5)
WBC: 10.2 10*3/uL (ref 4.0–10.5)

## 2014-05-03 MED ORDER — LIP MEDEX EX OINT
TOPICAL_OINTMENT | CUTANEOUS | Status: AC
  Administered 2014-05-03: 10:00:00
  Filled 2014-05-03: qty 7

## 2014-05-03 MED ORDER — MAGNESIUM OXIDE 400 (241.3 MG) MG PO TABS
400.0000 mg | ORAL_TABLET | Freq: Two times a day (BID) | ORAL | Status: DC
  Filled 2014-05-03: qty 1

## 2014-05-03 MED ORDER — SODIUM CHLORIDE 0.9 % IV SOLN
60.0000 mg | Freq: Once | INTRAVENOUS | Status: AC
  Administered 2014-05-03: 60 mg via INTRAVENOUS
  Filled 2014-05-03: qty 20
  Filled 2014-05-03: qty 6.67

## 2014-05-03 MED ORDER — MAGNESIUM OXIDE 400 (241.3 MG) MG PO TABS: 400.0000 mg | ORAL_TABLET | Freq: Two times a day (BID) | ORAL | Status: AC

## 2014-05-03 MED ORDER — MAGNESIUM SULFATE 2 GM/50ML IV SOLN
2.0000 g | Freq: Once | INTRAVENOUS | Status: AC
  Administered 2014-05-03: 2 g via INTRAVENOUS
  Filled 2014-05-03: qty 50

## 2014-05-03 MED ORDER — POTASSIUM CHLORIDE 10 MEQ/100ML IV SOLN
10.0000 meq | INTRAVENOUS | Status: AC
  Administered 2014-05-03 (×4): 10 meq via INTRAVENOUS
  Filled 2014-05-03 (×4): qty 100

## 2014-05-03 MED ORDER — POTASSIUM CHLORIDE CRYS ER 20 MEQ PO TBCR: EXTENDED_RELEASE_TABLET | ORAL | Status: AC

## 2014-05-03 MED ORDER — POTASSIUM CHLORIDE CRYS ER 20 MEQ PO TBCR
40.0000 meq | EXTENDED_RELEASE_TABLET | Freq: Once | ORAL | Status: AC
  Administered 2014-05-03: 40 meq via ORAL
  Filled 2014-05-03: qty 2

## 2014-05-03 MED ORDER — DILTIAZEM HCL ER COATED BEADS 120 MG PO CP24: 120.0000 mg | ORAL_CAPSULE | Freq: Every day | ORAL | Status: AC

## 2014-05-03 MED ORDER — POTASSIUM CHLORIDE CRYS ER 20 MEQ PO TBCR
60.0000 meq | EXTENDED_RELEASE_TABLET | Freq: Once | ORAL | Status: DC
  Filled 2014-05-03: qty 3

## 2014-05-03 MED ORDER — PANTOPRAZOLE SODIUM 40 MG PO PACK
40.0000 mg | PACK | Freq: Every day | ORAL | Status: DC
  Administered 2014-05-03: 40 mg via ORAL
  Filled 2014-05-03: qty 20

## 2014-05-03 MED ORDER — DOXYCYCLINE HYCLATE 100 MG PO TABS: 100.0000 mg | ORAL_TABLET | Freq: Two times a day (BID) | ORAL | Status: DC

## 2014-05-03 NOTE — Clinical Documentation Improvement (Signed)
Potassium results as below.  Potassium IV and po has been given.  Please identify any clinical conditions associated with the abnormal potassium level and document in your progress note and carryover to the discharge summary.    Component      Potassium  Latest Ref Rng      3.7 - 5.3 mEq/L  04/24/2014     4:53 PM 3.4 (L)  04/25/2014     5:50 AM 3.7  04/26/2014      3.0 (L)  04/27/2014      4.4  04/28/2014      3.7  04/29/2014      3.9  04/30/2014      3.5 (L)  05/01/2014      3.2 (L)  05/02/2014      3.2 (L)  05/03/2014      2.9 (LL)    Possible Clinical Conditions: -Hypokalemia -Other Condition (please specify) -Unable to Determine at present  Thank you, Mateo Flow, RN [phone] Clinical Documentation Specialist

## 2014-05-03 NOTE — Discharge Instructions (Signed)
Thrombocytopenia °Thrombocytopenia is a condition in which there is an abnormally small number of platelets in your blood. Platelets are also called thrombocytes. Platelets are needed for blood clotting. °CAUSES °Thrombocytopenia is caused by:  °· Decreased production of platelets. This can be caused by: °¨ Aplastic anemia in which your bone marrow quits making blood cells. °¨ Cancer in the bone marrow. °¨ Use of certain medicines, including chemotherapy. °¨ Infection in the bone marrow. °¨ Heavy alcohol consumption. °· Increased destruction of platelets. This can be caused by: °¨ Certain immune diseases. °¨ Use of certain drugs. °¨ Certain blood clotting disorders. °¨ Certain inherited disorders. °¨ Certain bleeding disorders. °¨ Pregnancy. °· Having an enlarged spleen (hypersplenism). In hypersplenism, the spleen gathers up platelets from circulation. This means the platelets are not available to help with blood clotting. The spleen can enlarge due to cirrhosis or other conditions. °SYMPTOMS  °The symptoms of thrombocytopenia are side effects of poor blood clotting. Some of these are: °· Abnormal bleeding. °· Nosebleeds. °· Heavy menstrual periods. °· Blood in the urine or stools. °· Purpura. This is a purplish discoloration in the skin produced by small bleeding vessels near the surface of the skin. °· Bruising. °· A rash that may be petechial. This looks like pinpoint, purplish-red spots on the skin and mucous membranes. It is caused by bleeding from small blood vessels (capillaries). °DIAGNOSIS  °Your caregiver will make this diagnosis based on your exam and blood tests. Sometimes, a bone marrow study is done to look for the original cells (megakaryocytes) that make platelets. °TREATMENT  °Treatment depends on the cause of the condition. °· Medicines may be given to help protect your platelets from being destroyed. °· In some cases, a replacement (transfusion) of platelets may be required to stop or prevent  bleeding. °· Sometimes, the spleen must be surgically removed. °HOME CARE INSTRUCTIONS  °· Check the skin and linings inside your mouth for bruising or bleeding as directed by your caregiver. °· Check your sputum, urine, and stool for blood as directed by your caregiver. °· Do not return to any activities that could cause bumps or bruises until your caregiver says it is okay. °· Take extra care not to cut yourself when shaving or when using scissors, needles, knives, and other tools. °· Take extra care not to burn yourself when ironing or cooking. °· Ask your caregiver if it is okay for you to drink alcohol. °· Only take over-the-counter or prescription medicines as directed by your caregiver. °· Notify all your caregivers, including dentists and eye doctors, about your condition. °SEEK IMMEDIATE MEDICAL CARE IF:  °· You develop active bleeding from anywhere in your body. °· You develop unexplained bruising or bleeding. °· You have blood in your sputum, urine, or stool. °MAKE SURE YOU: °· Understand these instructions. °· Will watch your condition. °· Will get help right away if you are not doing well or get worse. °Document Released: 06/02/2005 Document Revised: 08/25/2011 Document Reviewed: 04/04/2011 °ExitCare® Patient Information ©2015 [employer]. This information is not intended to replace advice given to you by your health care provider. Make sure you discuss any questions you have with your health care provider. ° °

## 2014-05-03 NOTE — Care Management Note (Signed)
CARE MANAGEMENT NOTE 05/03/2014  Patient:  [name], [name]   Account Number:  [id number]  Date Initiated:  04/25/2014  Documentation initiated by:  DAVIS,RHONDA  Subjective/Objective Assessment:   pt readmitted due to hcap and failed out patient treatment     Action/Plan:   SNF   Anticipated DC Date:  05/03/2014   Anticipated DC Plan:  [place]  In-house referral  NA      [name]  CM consult  [facility]      [facility] Choice  Resumption Of Svcs/PTA Provider   Choice offered to / List presented to:  NA   DME arranged  [facility]      DME agency  [place] arranged  [place].   Status of service:  In process, will continue to follow Medicare Important Message given?   (If response is "NO", the following Medicare IM given date fields will be blank) Date Medicare IM given:   Medicare IM given by:   Date Additional Medicare IM given:   Additional Medicare IM given by:    Discharge Disposition:  [place]  Per UR Regulation:  Reviewed for med. necessity/level of care/duration of stay  If discussed at [place] of Stay Meetings, dates discussed:    Comments:  05/03/14 Marney Doctor RN,BSN,NCM [phone] Pt to [place] home today with daughter.  Pt is set up with [facility] and home 02.  05/01/14 Marney Doctor RN,BSN,NCM Spoke with pt and daughter [name] about DC plans.  Daughter does not want pt to go to [facility] due to it being too far.  She states that she will bring her home and there will be family with her around the clock. Daughter states [facility] RN and SW were coming to her house and she was receiving 02 from [facility].  She would like to continue those services at DC.  Will need DC orders from MD.  Also left private home care list in room for [facility] in case needed.  NCM continuing to follow for DC  needs.  04/27/14 10:30 CM spoke with pt in room with CSW, [name]. Pt is  agreeable to going to SNF for rehab after hospitalization and  states she feels it would be a good idea to speak with her daughter, [name] for discharge needs. CM called [name] and [name] states she feels SNF is a good idea. [name] states application for disability has been apprived to begin January of 2106.   CM handed the phone over to CSW to begin arrangements.  No other CM needs were communicated.  Mariane Masters, BSN, [place].  [id number]/[id number] Rosana Hoes, RN, BSN, CCM Chart reviewed. Discharge needs and patient's stay to be reviewed and followed by case manager. per previous admissions patient has been referred to the wellness outpt clinic.

## 2014-05-03 NOTE — Discharge Summary (Signed)
[facility]  Physician Discharge Summary   Patient ID: [name] MRN: [id number] DOB/AGE: 59-Nov-1957 59 y.o.  Admit date: 04/24/2014 Discharge date: 05/03/2014  PCP: Lorayne Marek, MD  DISCHARGE DIAGNOSES:  Active Problems:   COPD (chronic obstructive pulmonary disease)   Breast cancer of upper-outer quadrant of right female breast   SIRS (systemic inflammatory response syndrome)   Hypertension   Leukocytosis   DVT (deep venous thrombosis)   Abdominal pain   Blood poisoning   Normocytic anemia   Thrombocytopenia   Diarrhea   Hypokalemia   Back pain   RECOMMENDATIONS FOR OUTPATIENT FOLLOW UP: 1. Close follow up with Dr. Jana Hakim 2. HIT panel is pending. To be followed by her oncologist.  DISCHARGE CONDITION: fair  Diet recommendation: Regular  Filed Weights   04/28/14 0718 04/29/14 0557 04/29/14 2047  Weight: 51.9 kg (114 lb 6.7 oz) 51.6 kg (113 lb 12.1 oz) 52.9 kg (116 lb 10 oz)    INITIAL HISTORY: [name] is a 58 y.o. Female with metastatic breast cancer followed by Dr. Jana Hakim, last chemo on 03/27/14, COPD and hypertension who was admitted to [facility] from 10/26 through 11/4 for sepsis due to pneumonia and COPD exacerbation. Discharged on steroids and levofloxacin. She develop RUQ pain but developed recurrent high fevers with diarrhea (acute on chronic). On the night of admission and discovered that she was confused so they had her brought back to the ER. Was found to be septic without a source of infection.C. Diff was negative. Her temperature has clearly trended down on IV antibiotics, however. Urine cx demonstrating enterococcus infection.   Consultations:  Oncology  GI  HOSPITAL COURSE:   SIRS: Appears to have been secondary to enterococcus infection  This has resolved. She presented with Fever, tachypneic, leukocytosis and encephalopathic. CT abd/pelvis showed dilated bile ducts, Stool culture negative, Cdiff PCR negative. lactic  acid with in normal. She was started on empiric antibiotics vanc and zosyn. MRCPshowed abruptly tapering at the ampulla. No choledocholithiasis is seen. Gi was consulted they do not feel that she will benefit from ERCP. Unlikely to be cholangitis. She was de-escalated to levaquin on 11/14. Enterococcus cx showing intermediate resistance to levaquin. She was changed over to Doxycycline.   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 Jana Hakim on Neoadjuvant chemo. Last chemo in October. Now with metastatic disease to her thoracici spine. Cont narcotics as needed. Oncology to further address as outpatient.   B/L pleural effusion: IR was consulted. However, there was not enough fluid for thoracentesis at this moment.   Acute encephalopathy: - Resolved. 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 This was diagnosed in October. She was on lovenox at home. Lovenox has been held due to low platelet counts.  COPD On 2L [place] at home, This issue has been stable. Continue with home medications.  HTN:  She was changed over to Cardizem for better control. She was also noted to be tachycardic at times. It is sinus tachycardia based on EKG. Thyroid function tests were unremarkable recently.  Normocytic anemia: -Due to chemotherapy most likely.  Thrombocytopenia: Platelet counts noted to be decreasing in the hospital. HIT panel was ordered today per Dr. Virgie Dad recommendation. He recommends also stopping the Lovenox for now. He will pursue this issue in his office. At this time there are no signs of bleeding. No need for transfusion. Daughter has been  notified of this finding and asked to look out for signs of bleeding. Low platelets could be due to marrow involvement by the cancer.  Hypercalcemia. She was given pamidronate today prior to discharge. Her oncologist once again to follow this  up.  GERD -continue PPI  protein calorie malnutrition severe -continue feeding supplements  Hypokalemia This will be repleted aggressively. Magnesium level was checked and was found to be low. She was given magnesium sulfate intravenously. She'll be prescribed oral magnesium oxide. She'll also be given prescription for potassium at home.  Overall, patient is stable, though frail. After discussions with her oncologist and her daughter she is considered okay for discharge at this time. Daughter had multiple questions regarding patient's prognosis, however, I have asked her to discuss this with the patient's oncologist. Prognosis appears to be poor at this time.  Physical therapy had recommended skilled nursing facility placement. Social worker discussed this in detail with the patient and her family. They at this time decided to take her home with home health.  PERTINENT LABS:  The results of significant diagnostics from this hospitalization (including imaging, microbiology, ancillary and laboratory) are listed below for reference.    Microbiology: Recent Results (from the past 240 hour(s))  Blood Culture (routine x 2)     Status: None   Collection Time: 04/24/14  4:53 PM  Result Value Ref Range Status   Specimen Description BLOOD LEFT HAND  Final   Special Requests BOTTLES DRAWN AEROBIC AND ANAEROBIC 10 ML  Final   Culture  Setup Time   Final    04/24/2014 22:39 Performed at [facility]    Culture   Final    NO GROWTH 5 DAYS Performed at [facility]    Report Status 04/30/2014 FINAL  Final  Blood Culture (routine x 2)     Status: None   Collection Time: 04/24/14  4:53 PM  Result Value Ref Range Status   Specimen Description BLOOD LEFT ANTECUBITAL  Final   Special Requests BOTTLES DRAWN AEROBIC AND ANAEROBIC  5 CC  Final   Culture  Setup Time   Final    04/24/2014 22:39 Performed at [facility]    Culture   Final    STAPHYLOCOCCUS SPECIES  (COAGULASE NEGATIVE) Note: THE SIGNIFICANCE OF ISOLATING THIS ORGANISM FROM A SINGLE SET OF BLOOD CULTURES WHEN MULTIPLE SETS ARE DRAWN IS UNCERTAIN. PLEASE NOTIFY THE MICROBIOLOGY DEPARTMENT WITHIN ONE WEEK IF SPECIATION AND SENSITIVITIES ARE REQUIRED. Note: Gram Stain Report Called to,Read Back By and Verified With: DELISA COBB ON 04/25/2014 AT 10:45P BY WILEJ Performed at [facility]    Report Status 04/27/2014 FINAL  Final  Urine culture     Status: None   Collection Time: 04/24/14  5:02 PM  Result Value Ref Range Status   Specimen Description URINE, CLEAN CATCH  Final   Special Requests NONE  Final   Culture  Setup Time   Final    04/24/2014 23:08 Performed at [place]   Final    30,000 COLONIES/ML Performed at [facility]    Culture   Final    ENTEROCOCCUS SPECIES Performed at [facility]    Report Status 04/28/2014 FINAL  Final   Organism ID, Bacteria ENTEROCOCCUS SPECIES  Final      Susceptibility   Enterococcus species - MIC*    AMPICILLIN <=2 SENSITIVE Sensitive     LEVOFLOXACIN 4 INTERMEDIATE Intermediate     NITROFURANTOIN  [place] <=0.5 SENSITIVE Sensitive     TETRACYCLINE <=1 SENSITIVE Sensitive     * ENTEROCOCCUS SPECIES  Clostridium Difficile by PCR     Status: None   Collection Time: 04/25/14  9:42 AM  Result Value Ref Range Status   C difficile by pcr NEGATIVE NEGATIVE Final    Comment: Performed at [facility]  Stool culture     Status: None   Collection Time: 04/25/14  9:42 AM  Result Value Ref Range Status   Specimen Description STOOL  Final   Special Requests NONE  Final   Culture   Final    NO SALMONELLA, SHIGELLA, CAMPYLOBACTER, YERSINIA, OR E.COLI [id number] ISOLATED Performed at [facility]    Report Status 04/29/2014 FINAL  Final  Culture, Urine     Status: None   Collection Time: 05/01/14  9:11 PM  Result Value Ref Range Status   Specimen  Description URINE, CLEAN CATCH  Final   Special Requests Normal  Final   Culture  Setup Time   Final    05/02/2014 01:46 Performed at [place] Performed at [facility]   Final   Culture NO GROWTH Performed at [facility]   Final   Report Status 05/02/2014 FINAL  Final     Labs: Basic Metabolic Panel:  Recent Labs Lab 04/29/14 0741 04/30/14 0610 05/01/14 0435 05/02/14 0545 05/03/14 0450 05/03/14 1130  NA 140 143 140 144 143  --   K 3.9 3.5* 3.2* 3.2* 2.9*  --   CL 101 104 102 101 101  --   CO2 25 26 24 22 27   --   GLUCOSE 114* 87 91 89 120*  --   BUN 5* 5* 4* 3* 6  --   CREATININE 0.59 0.69 0.68 0.65 0.64  --   CALCIUM 10.4 11.2* 11.0* 10.9* 11.5*  --   MG  --   --   --   --   --  1.3*   Liver Function Tests:  Recent Labs Lab 04/27/14 1602 04/28/14 0537  AST  --  37  ALT  --  7  ALKPHOS  --  94  BILITOT  --  0.3  PROT 6.4 6.1  ALBUMIN 2.1* 2.0*   CBC:  Recent Labs Lab 04/29/14 0741 04/30/14 0610 05/01/14 0435 05/02/14 0545 05/03/14 0450  WBC 12.5* 10.9* 10.8* 12.3* 10.2  HGB 8.0* 7.5* 7.5* 7.5* 7.5*  HCT 24.6* 23.6* 23.4* 23.4* 22.6*  MCV 91.1 92.5 94.4 91.8 91.5  PLT 34* 28* 26* 19* 18*    IMAGING STUDIES  Ct Thoracic Spine W Contrast  04/28/2014   CLINICAL DATA:  Upper back pain, 6 months duration. Recent episode of pneumonia. Personal history of left breast cancer with ongoing chemotherapy treatment.  EXAM: CT THORACIC SPINE WITH CONTRAST  TECHNIQUE: Multidetector CT imaging of the thoracic spine was performed using the standard protocol following bolus administration of intravenous contrast. Multiplanar CT image reconstructions were also generated.  CONTRAST:  4mL OMNIPAQUE IOHEXOL 300 MG/ML  SOLN  COMPARISON:  CT chest 04/11/2014, chest CT 04/03/2014  FINDINGS: The bones have a diffusely heterogeneous appearance consistent with widespread osseous metastatic disease. Newly seen since the  previous studies is a fracture of the spinous process of T1, presumably a pathologic fracture. This does not affect the lamina. Additionally, there is a newly seen partial compression fracture of T9 with loss of height  of about 20%. No retropulsed bone. There appears to be a minimal superior endplate fracture at L1, not completely image to. No other regional fracture is seen. Posterior medial ribs do not show any discernible fracture. There is no dominant lytic lesion. No extraosseous tumor or canal compromise is discernible. No significant change in the chest disease with right lung mass, lymphadenopathy and small effusions with dependent atelectasis.  IMPRESSION: Widespread osseous metastatic disease. No dominant lytic lesions or evidence of extraosseous tumor. No evidence of canal compromise.  Newly seen spinous process fracture at T1.  Newly seen compression fracture at T9 with loss of height of 20% without retropulsion.  Newly seen minimal superior endplate fracture at L1.   Electronically Signed   By: Nelson Chimes M.D.   On: 04/28/2014 15:29   Ct Abdomen Pelvis W Contrast  04/25/2014   CLINICAL DATA:  Chronic right upper quadrant abdominal pain. History of metastatic breast cancer with chemotherapy on-going.  EXAM: CT ABDOMEN AND PELVIS WITH CONTRAST  TECHNIQUE: Multidetector CT imaging of the abdomen and pelvis was performed using the standard protocol following bolus administration of intravenous contrast.  CONTRAST:  78mL OMNIPAQUE IOHEXOL 300 MG/ML  SOLN  COMPARISON:  CT abdomen pelvis 04/10/2014  FINDINGS: Lung bases: Mild atelectasis of both lung bases. Negative for pleural effusion. Stable prominent 10 mm paraesophageal lymph node on image 7. On image number 1 there is an incompletely imaged probable prominent subcarinal lymph node measuring up to 1.2 cm short axis on this image. Left hilar nodal tissue is incompletely imaged, but measures at least 9 mm short axis. Heart size within normal limits.   The liver is normal in size and enhancement. No suspicious hepatic lesions. Very mild intrahepatic biliary ductal dilatation is stable. Patient is status post cholecystectomy and there is stable chronic prominence of the extrahepatic bile duct measuring up to 15 mm in diameter. There appears to be an abrupt transition from dilated duct to a decompressed distal common bile duct, for which a distal stricture or ampullary lesion cannot be excluded.  The spleen is normal in size and enhancement. Normal adrenal glands. Stable appearance of both kidneys. Negative for renal mass or hydronephrosis. Probable focal scarring in the posterior cortex of the upper pole the left kidney. No visible urinary tract stones. Ureters are decompressed. Urinary bladder has a normal appearance.  Stomach is well distended with oral contrast and air and appears normal. Small bowel loops are normal in caliber and wall thickness. Normal appearance of the terminal ileum. The colon is normal in caliber and contains multiple scattered diverticula, most prominent in the sigmoid region. Appendix not definitely visualized, but no evidence of appendicitis. Rectum unremarkable. Hysterectomy. No adnexal mass.  Negative for pathologically enlarged mesenteric or retroperitoneal lymph nodes. Atherosclerotic calcification is seen scattered and the normal caliber abdominal aorta and iliac vasculature. Retro aortic left renal vein noted. Negative for ascites.  Small subcutaneous locules of gas in the anterior abdominal wall of the left lower quadrant, likely injection sites, were not previously present.  Diffusely mottled appearance of the visualized bones consistent with bony metastatic disease. The imaged vertebral body heights are preserved. No acute fracture is identified in the visualized thoracolumbar spine, imaged lower ribs, or bony pelvis.  IMPRESSION: 1. No acute findings in the abdomen or pelvis compared to recent CT of 04/10/2014. 2. Persist  extrahepatic biliary ductal dilatation to 1.5 cm, with fairly abrupt transition to completely decompressed extrahepatic bile duct. Stable very mild intrahepatic biliary ductal dilatation.  The ductal dilatation could be related to the postcholecystectomy state; however, the possibility of a distal ductal stricture or small ampullary of obstructing lesion is difficult to exclude. Correlation with liver function tests is suggested. 3. Diffuse bony metastatic disease M, unchanged. 4. Stable prominent paraesophageal lymph node, and partially imaged probable prominent subcarinal lymph node. 5. Colonic diverticulosis without evidence of acute diverticulitis.   Electronically Signed   By: Curlene Dolphin M.D.   On: 04/25/2014 12:35   Mr Abdomen Mrcp Wo Cm  04/26/2014   CLINICAL DATA:  Severe right upper quadrant pain, biliary ductal dilatation on CT, history of metastatic breast cancer, chemotherapy ongoing  EXAM: MRI ABDOMEN WITHOUT CONTRAST  (INCLUDING MRCP)  TECHNIQUE: Multiplanar multisequence MR imaging of the abdomen was performed. Heavily T2-weighted images of the biliary and pancreatic ducts were obtained, and three-dimensional MRCP images were rendered by post processing.  COMPARISON:  CT abdomen pelvis dated 04/25/2014  FINDINGS: The examination had to be discontinued prior to completion due to patient discomfort.  Motion degraded images.  Trace bilateral pleural effusions with associated compressive atelectasis at the lung bases.  Liver is unremarkable.  No focal hepatic lesions.  Status post cholecystectomy. No intrahepatic ductal dilatation. Dilated common duct, measuring up to 13 mm, and abruptly tapering at the ampulla. No choledocholithiasis is seen.  Pancreas is grossly unremarkable. No dilatation of the main pancreatic duct.  Spleen and adrenal glands are grossly unremarkable.  Kidneys are within normal limits.  No hydronephrosis.  Small left para-aortic nodes measuring up to 8 mm short axis.  No  abdominal ascites.  Body wall edema.  Soft tissue mass/inflammatory changes involving the right breast (series 3/image 7), incompletely visualized, likely corresponding to known right breast cancer.  IMPRESSION: Study had to be discontinued prior to completion due to patient discomfort. Motion degraded images.  Status post cholecystectomy. No intrahepatic ductal dilatation. Dilated common duct, measuring up to 13 mm, an abruptly tapering at the ampulla. No choledocholithiasis is seen.  In the setting of essentially normal LFTs, this appearance is favored to be postsurgical. Less likely, this could reflect a distal CBD stricture. If there is continued clinical concern, consider ERCP.   Electronically Signed   By: Julian Hy M.D.   On: 04/26/2014 14:15   Mr 3d Recon At Scanner  04/26/2014   CLINICAL DATA:  Severe right upper quadrant pain, biliary ductal dilatation on CT, history of metastatic breast cancer, chemotherapy ongoing  EXAM: MRI ABDOMEN WITHOUT CONTRAST  (INCLUDING MRCP)  TECHNIQUE: Multiplanar multisequence MR imaging of the abdomen was performed. Heavily T2-weighted images of the biliary and pancreatic ducts were obtained, and three-dimensional MRCP images were rendered by post processing.  COMPARISON:  CT abdomen pelvis dated 04/25/2014  FINDINGS: The examination had to be discontinued prior to completion due to patient discomfort.  Motion degraded images.  Trace bilateral pleural effusions with associated compressive atelectasis at the lung bases.  Liver is unremarkable.  No focal hepatic lesions.  Status post cholecystectomy. No intrahepatic ductal dilatation. Dilated common duct, measuring up to 13 mm, and abruptly tapering at the ampulla. No choledocholithiasis is seen.  Pancreas is grossly unremarkable. No dilatation of the main pancreatic duct.  Spleen and adrenal glands are grossly unremarkable.  Kidneys are within normal limits.  No hydronephrosis.  Small left para-aortic nodes  measuring up to 8 mm short axis.  No abdominal ascites.  Body wall edema.  Soft tissue mass/inflammatory changes involving the right breast (series 3/image 7), incompletely visualized, likely corresponding  to known right breast cancer.  IMPRESSION: Study had to be discontinued prior to completion due to patient discomfort. Motion degraded images.  Status post cholecystectomy. No intrahepatic ductal dilatation. Dilated common duct, measuring up to 13 mm, an abruptly tapering at the ampulla. No choledocholithiasis is seen.  In the setting of essentially normal LFTs, this appearance is favored to be postsurgical. Less likely, this could reflect a distal CBD stricture. If there is continued clinical concern, consider ERCP.   Electronically Signed   By: Julian Hy M.D.   On: 04/26/2014 14:15   Dg Chest Port 1 View  04/24/2014   CLINICAL DATA:  Fever and tachycardia.  EXAM: PORTABLE CHEST - 1 VIEW  COMPARISON:  04/15/2014 as well as chest CT 04/11/2014  FINDINGS: Left subclavian Port-A-Cath is unchanged with tip in the region of the cavoatrial junction. Low lung volumes with mild prominence of the perihilar markings. Stable known right suprahilar mass measuring approximately 3 cm. Possible small amount of bilateral pleural fluid. Cardiomediastinal silhouette and remainder of the exam is unchanged.  IMPRESSION: Low lung volumes with findings suggesting mild vascular congestion. Continued small amount bilateral pleural fluid.  Stable known right suprahilar mass.   Electronically Signed   By: Marin Olp M.D.   On: 04/24/2014 17:33     DISCHARGE EXAMINATION: Filed Vitals:   05/02/14 2122 05/03/14 0359 05/03/14 0933 05/03/14 1400  BP: 148/67 148/83  140/80  Pulse: 130 119  110  Temp: 100.2 F (37.9 C) 98.6 F (37 C)  98 F (36.7 C)  TempSrc: Oral Oral  Oral  Resp: 18 19  18   Height:      Weight:      SpO2: 92% 95% 99% 95%   General appearance: alert, cooperative, appears stated age and no  distress Resp: clear to auscultation bilaterally Cardio: S1, S2 is tachycardic. Regular. No S3, S4. No rubs, murmurs, or bruit.  No pedal edema. GI: soft, non-tender; bowel sounds normal; no masses,  no organomegaly  DISPOSITION: home with daughter. Home health will be arranged.  Discharge Instructions    Call MD for:  difficulty breathing, headache or visual disturbances    Complete by:  As directed      Call MD for:  extreme fatigue    Complete by:  As directed      Call MD for:  persistant nausea and vomiting    Complete by:  As directed      Call MD for:  redness, tenderness, or signs of infection (pain, swelling, redness, odor or green/yellow discharge around incision site)    Complete by:  As directed      Call MD for:  severe uncontrolled pain    Complete by:  As directed      Call MD for:  temperature >100.4    Complete by:  As directed      Diet - low sodium heart healthy    Complete by:  As directed      Increase activity slowly    Complete by:  As directed            ALLERGIES:  Allergies  Allergen Reactions  . Penicillins Other (See Comments)    Reaction a long time ago    Current Discharge Medication List    START taking these medications   Details  diltiazem (CARDIZEM CD) 120 MG 24 hr capsule Take 1 capsule (120 mg total) by mouth daily. Qty: 30 capsule, Refills: 2    doxycycline (VIBRA-TABS) 100  MG tablet Take 1 tablet (100 mg total) by mouth every 12 (twelve) hours. For 4 more days Qty: 8 tablet, Refills: 0    magnesium oxide (MAG-OX) 400 (241.3 MG) MG tablet Take 1 tablet (400 mg total) by mouth 2 (two) times daily. Qty: 60 tablet, Refills: 0    potassium chloride SA (K-DUR,KLOR-CON) 20 MEQ tablet Take 2 tablets twice daily for 3 days and then take 2 tablets once daily. Qty: 60 tablet, Refills: 0      CONTINUE these medications which have NOT CHANGED   Details  arformoterol (BROVANA) 15 MCG/2ML NEBU Take 2 mLs (15 mcg total) by nebulization 2  (two) times daily. Qty: 120 mL, Refills: 0    bisacodyl (DULCOLAX) 10 MG suppository Place 1 suppository (10 mg total) rectally daily as needed for moderate constipation. Qty: 12 suppository, Refills: 0    butalbital-acetaminophen-caffeine (FIORICET) 50-325-40 MG per tablet Take 1-2 tablets by mouth every 6 (six) hours as needed for headache. Qty: 20 tablet, Refills: 0    carbamide peroxide (DEBROX) 6.5 % otic solution Place 5 drops into both ears 2 (two) times daily as needed.    cholecalciferol (VITAMIN D) 1000 UNITS tablet Take 2,000 Units by mouth daily.    cyclobenzaprine (FLEXERIL) 5 MG tablet Take 1 tablet (5 mg total) by mouth 3 (three) times daily as needed for muscle spasms. Qty: 30 tablet, Refills: 0    hydrocortisone (ANUSOL-HC) 25 MG suppository Place 25 mg rectally 2 (two) times daily as needed.    lidocaine-prilocaine (EMLA) cream Apply 1 application topically as needed. Apply over port area 1-2 hours before chemo, cover with plastic wrap Qty: 30 g, Refills: 0    ondansetron (ZOFRAN) 8 MG tablet Take 1 tablet (8 mg total) by mouth 2 (two) times daily. Start the day after chemo for 2 days. Then take as needed for nausea or vomiting. Qty: 30 tablet, Refills: 1   Associated Diagnoses: Breast cancer metastasized to brain, right; Breast cancer metastasized to bone, right; Breast cancer of upper-outer quadrant of right female breast    oxyCODONE-acetaminophen (ROXICET) 5-325 MG per tablet Take 1-2 tablets by mouth every 4 (four) hours as needed. Qty: 50 tablet, Refills: 0    pantoprazole (PROTONIX) 40 MG tablet Take 1 tablet (40 mg total) by mouth daily. Qty: 30 tablet, Refills: 2   Associated Diagnoses: Breast cancer of upper-outer quadrant of right female breast    polyethylene glycol (MIRALAX / GLYCOLAX) packet Take 17 g by mouth 2 (two) times daily. Qty: 14 each, Refills: 0    prochlorperazine (COMPAZINE) 10 MG tablet Take 1 tablet (10 mg total) by mouth every 6 (six)  hours as needed (Nausea or vomiting). Qty: 30 tablet, Refills: 1   Associated Diagnoses: Breast cancer metastasized to brain, right; Breast cancer metastasized to bone, right; Breast cancer of upper-outer quadrant of right female breast    budesonide (PULMICORT) 0.5 MG/2ML nebulizer solution Take 2 mLs (0.5 mg total) by nebulization every 12 (twelve) hours. Qty: 100 mL, Refills: 3    levalbuterol (XOPENEX) 0.63 MG/3ML nebulizer solution Take 3 mLs (0.63 mg total) by nebulization every 3 (three) hours as needed for wheezing or shortness of breath. Qty: 3 mL, Refills: 12    lidocaine (LIDODERM) 5 % Place 1 patch onto the skin daily. Remove & Discard patch within 12 hours or as directed by MD Qty: 30 patch, Refills: 0    LORazepam (ATIVAN) 0.5 MG tablet Take 1 tablet (0.5 mg total) by mouth every  8 (eight) hours. Qty: 30 tablet, Refills: 0      STOP taking these medications     enoxaparin (LOVENOX) 80 MG/0.8ML injection      fluticasone (FLOVENT HFA) 220 MCG/ACT inhaler      amLODipine (NORVASC) 2.5 MG tablet      levofloxacin (LEVAQUIN) 750 MG tablet      predniSONE (DELTASONE) 20 MG tablet        Follow-up Information    Follow up with Chauncey Cruel, MD.   Specialty:  Oncology   Why:  please call his office if you dont hear anything by Friday   Contact information:   [place] [address] [phone]       TOTAL DISCHARGE TIME: 35 mins  [facility] Pager [phone]  05/03/2014, 5:03 PM

## 2014-05-03 NOTE — Progress Notes (Signed)
CRITICAL VALUE ALERT  Critical value received: Potassium 2.9   Date of notification:  05/03/14  Time of notification:  0558  Critical value read back:Yes.    Nurse who received alert:  Noreene Larsson RN, BSN  MD notified (1st page):  Chaney Malling NP  Time of first page: [phone]  Responding MD:  Chaney Malling NP  Time MD responded:  [phone]

## 2014-05-05 ENCOUNTER — Encounter: Payer: Medicaid Other | Admitting: Nurse Practitioner

## 2014-05-05 ENCOUNTER — Telehealth: Payer: Self-pay | Admitting: *Deleted

## 2014-05-05 LAB — HEPARIN INDUCED THROMBOCYTOPENIA PNL
HEPARIN INDUCED PLT AB: NEGATIVE
Patient O.D.: 0.039
UFH High Dose UFH H: 0 % Release
UFH LOW DOSE 0.1 IU/ML: 0 %
UFH Low Dose 0.5 IU/mL: 0 % Release
UFH SRA Result: NEGATIVE

## 2014-05-05 NOTE — Telephone Encounter (Signed)
Message left by [facility], Donita, with [facility] stating per visit " I am calling you per daughter"  This RN returned call to [facility] at [phone] with update on pt's condition as -  No appetite Ongoing sharp- shooting pains in abdomen O2 sats 46% " but pt was not wearing any oxygen- O2 applied and pt's sats recovered to 98% " Noting itching on arms, legs and buttocks.   This RN reviewed above with MD - recommended need to give pain medication on a scheduled basis. Pt to take  magnesium citrate now to facilitate BM.  This RN called to [name] and discussed above- she states pt has known history of bowel issues including constipation. She will follow up with care when she leaves work.  Per discussion [name] understands if symptoms do not improve she is to call on call MD and or proceed to the ER.

## 2014-05-05 NOTE — Telephone Encounter (Signed)
This RN returned call per message from pt dtr stating concern due to "mom had severe pain last " .  Per discussion, [name] states her aunt was with the her mother- per her aunt's call to her- mom was reluctant to taking her pain medication- but did take it this am.  [name] contacted the aunt while on the phone with this RN and overall pt is having better pain control- but still is having pain in her abdomen.  This RN discussed pain issue stating need for active use of pain medication for control- as well as concern for management of pain and oncoming weekend.  This RN offered an appointment for pt - which [name] was unsure about - [name] states RN from home health is scheduled for visit today between 12-1pm.  Plan per this discussion is visiting RN can assess pain issues and medications available and call this RN- then if needed pt will come in for visit by symptom management evaluation.  [name] verbalized understanding of above plan.

## 2014-05-08 ENCOUNTER — Ambulatory Visit (HOSPITAL_BASED_OUTPATIENT_CLINIC_OR_DEPARTMENT_OTHER): Payer: Medicaid Other | Admitting: Adult Health

## 2014-05-08 ENCOUNTER — Other Ambulatory Visit: Payer: Medicaid Other

## 2014-05-08 ENCOUNTER — Other Ambulatory Visit: Payer: Self-pay | Admitting: *Deleted

## 2014-05-08 ENCOUNTER — Telehealth: Payer: Self-pay | Admitting: Adult Health

## 2014-05-08 ENCOUNTER — Encounter: Payer: Self-pay | Admitting: Adult Health

## 2014-05-08 ENCOUNTER — Ambulatory Visit: Payer: Medicaid Other

## 2014-05-08 ENCOUNTER — Ambulatory Visit (HOSPITAL_COMMUNITY)
Admission: RE | Admit: 2014-05-08 | Discharge: 2014-05-08 | Disposition: A | Discharge status: Home / Self Care | Payer: Medicaid Other | Source: Ambulatory Visit | Attending: Oncology | Admitting: Oncology

## 2014-05-08 ENCOUNTER — Telehealth: Payer: Self-pay | Admitting: *Deleted

## 2014-05-08 ENCOUNTER — Other Ambulatory Visit: Payer: Self-pay | Admitting: Nurse Practitioner

## 2014-05-08 ENCOUNTER — Other Ambulatory Visit (HOSPITAL_BASED_OUTPATIENT_CLINIC_OR_DEPARTMENT_OTHER): Payer: Medicaid Other

## 2014-05-08 VITALS — BP 106/69 | HR 108 | Temp 98.3°F | Resp 18

## 2014-05-08 DIAGNOSIS — D6189 Other specified aplastic anemias and other bone marrow failure syndromes: Secondary | ICD-10-CM

## 2014-05-08 DIAGNOSIS — C7951 Secondary malignant neoplasm of bone: Secondary | ICD-10-CM

## 2014-05-08 DIAGNOSIS — C7800 Secondary malignant neoplasm of unspecified lung: Secondary | ICD-10-CM

## 2014-05-08 DIAGNOSIS — D6489 Other specified anemias: Secondary | ICD-10-CM | POA: Diagnosis present

## 2014-05-08 DIAGNOSIS — D649 Anemia, unspecified: Secondary | ICD-10-CM

## 2014-05-08 DIAGNOSIS — C50911 Malignant neoplasm of unspecified site of right female breast: Secondary | ICD-10-CM

## 2014-05-08 DIAGNOSIS — C50919 Malignant neoplasm of unspecified site of unspecified female breast: Secondary | ICD-10-CM

## 2014-05-08 DIAGNOSIS — C7931 Secondary malignant neoplasm of brain: Secondary | ICD-10-CM

## 2014-05-08 DIAGNOSIS — C773 Secondary and unspecified malignant neoplasm of axilla and upper limb lymph nodes: Secondary | ICD-10-CM

## 2014-05-08 DIAGNOSIS — Z853 Personal history of malignant neoplasm of breast: Secondary | ICD-10-CM

## 2014-05-08 DIAGNOSIS — C50411 Malignant neoplasm of upper-outer quadrant of right female breast: Secondary | ICD-10-CM

## 2014-05-08 LAB — CBC WITH DIFFERENTIAL/PLATELET
BASO%: 1.1 % (ref 0.0–2.0)
Basophils Absolute: 0.1 10*3/uL (ref 0.0–0.1)
EOS ABS: 0.1 10*3/uL (ref 0.0–0.5)
EOS%: 1.2 % (ref 0.0–7.0)
HEMATOCRIT: 25.2 % — AB (ref 34.8–46.6)
HEMOGLOBIN: 7.6 g/dL — AB (ref 11.6–15.9)
LYMPH%: 26.4 % (ref 14.0–49.7)
MCH: 28.7 pg (ref 25.1–34.0)
MCHC: 30.2 g/dL — AB (ref 31.5–36.0)
MCV: 95.1 fL (ref 79.5–101.0)
MONO#: 0.9 10*3/uL (ref 0.1–0.9)
MONO%: 10.5 % (ref 0.0–14.0)
NEUT%: 60.8 % (ref 38.4–76.8)
NEUTROS ABS: 5.4 10*3/uL (ref 1.5–6.5)
Platelets: 23 10*3/uL — ABNORMAL LOW (ref 145–400)
RBC: 2.65 10*6/uL — ABNORMAL LOW (ref 3.70–5.45)
RDW: 20.7 % — ABNORMAL HIGH (ref 11.2–14.5)
WBC: 8.8 10*3/uL (ref 3.9–10.3)
lymph#: 2.3 10*3/uL (ref 0.9–3.3)
nRBC: 29 % — ABNORMAL HIGH (ref 0–0)

## 2014-05-08 LAB — COMPREHENSIVE METABOLIC PANEL (CC13)
ALBUMIN: 2.3 g/dL — AB (ref 3.5–5.0)
ALT: 7 U/L (ref 0–55)
AST: 76 U/L — ABNORMAL HIGH (ref 5–34)
Alkaline Phosphatase: 128 U/L (ref 40–150)
Anion Gap: 14 mEq/L — ABNORMAL HIGH (ref 3–11)
BUN: 27.2 mg/dL — AB (ref 7.0–26.0)
CALCIUM: 10.3 mg/dL (ref 8.4–10.4)
CHLORIDE: 111 meq/L — AB (ref 98–109)
CO2: 20 mEq/L — ABNORMAL LOW (ref 22–29)
CREATININE: 1.3 mg/dL — AB (ref 0.6–1.1)
Glucose: 117 mg/dl (ref 70–140)
POTASSIUM: 4.9 meq/L (ref 3.5–5.1)
Sodium: 145 mEq/L (ref 136–145)
Total Bilirubin: 0.21 mg/dL (ref 0.20–1.20)
Total Protein: 7.6 g/dL (ref 6.4–8.3)

## 2014-05-08 LAB — TECHNOLOGIST REVIEW

## 2014-05-08 LAB — HOLD TUBE, BLOOD BANK

## 2014-05-08 MED ORDER — LIDOCAINE 5 % EX PTCH: 1.0000 | MEDICATED_PATCH | CUTANEOUS | Status: AC

## 2014-05-08 MED ORDER — OXYCODONE-ACETAMINOPHEN 5-325 MG PO TABS
1.0000 | ORAL_TABLET | ORAL | Status: AC | PRN
Start: 2014-05-08 — End: ?

## 2014-05-08 NOTE — Telephone Encounter (Signed)
, °

## 2014-05-08 NOTE — Progress Notes (Addendum)
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 The patient then had routine yearly mammography until 10/24/2009.  Sometime in 2014 the patient noted a mass in her right breast. She states she brought this to medical attention several times, although I do not see that reflected in the outside notes we have. In any case on 12/06/2013 the patient presented to her primary clinic with a complaint of left-sided breast pain. Exam showed the right breast however to be larger than the left. There was a thickened area versus lump at the 12:00 position in the right breast above the areola. There was also a mass in the right upper quadrant of the right breast. The patient was referred to the breast Center and on 6:30 at 2015 bilateral diagnostic mammography and right breast ultrasonography at the breast Center showed a 6 cm area in the right breast upper outer quadrant with increased density associated with branching calcifications. There was a separate area more medially that also appeared abnormal. There was diffuse skin thickening and interval enlarged right axillary lymph nodes. On exam the mammographer was able to palpate a 5 cm firm fixed palpable mass  in the upper-outer quadrant of the right breast, but mild skin redness. There was also a palpable right axillary lymph node. Ultrasound confirmed a large irregular hypoechoic mass at the 11:00 position of the right breast measuring 4.1 cm maximally. More laterally a separate mass in the 9:00 position measure 4.4 cm maximally. In the right axilla there were multiple abnormal appearing axillary lymph nodes.  Biopsy of both these suspicious masses in the right breast as well as one of the suspicious lymph nodes 12/21/2013 showed ([id number]) all 3 biopsies do show identical invasive ductal carcinoma, grade 3, triple negative, with an MIB-1 of 85%. On 12/26/2013 the patient underwent bilateral breast MRI. This showed an abnormal area measuring 7.2 cm in the central right breast involving or 4 quadrants and centered superiorly, with diffuse skin thickening as well as level I and 2 metastatic right axillary adenopathy. There was also an enlarged right internal mammary lymph node. The left side was unremarkable.   INTERVAL HISTORY: [name] returns today for follow up of her metastatic breast cancer, accompanied by her daughter, [name]. She was admitted tot he hospital on 04/10/2014 for sepsis, pneumonia, and was very sick requiring ICU care.  She was discharged on 04/19/2014 but readmitted on 04/24/14 for secondary enterococcus infection.   She has continued to get weaker, and has become more confused recently.  She is going to live with her daughter due to the increase in her level of care.  She is not really eating any more, but drinks a lot of water, too much to count.  She continues to have pain and is taking Percocet.  She also has a prescription for a lidocaine patch.  She is not doing well.    REVIEW OF SYSTEMS: A 10 point review of systems was conducted and is otherwise negative except for what is noted above.    PAST MEDICAL HISTORY: Past Medical History  Diagnosis Date  . Acid reflux   . Asthma   .  COPD (chronic obstructive pulmonary disease)   . Cancer     lumpectomy, radiation 2009  . Seasonal allergies   . Alcohol abuse   . Elevated d-dimer     negative chest CT  . Allergy   . Emphysema of lung   . Hot flashes   . Reading disorder   . Impaired writing skills   . Wears glasses   . Wears dentures     top  . HOH (hard of hearing)     left ear  . S/P radiation therapy 01/13/14    SRS left parietal 20Gy    PAST SURGICAL HISTORY: Past Surgical History  Procedure Laterality Date  . Breast lumpectomy    . Abdominal hysterectomy    . Portacath placement Left 12/30/2013    Procedure: INSERTION PORT-A-CATH;  Surgeon: Merrie Roof, MD;  Location: [facility];  Service: General;  Laterality: Left;  subclavian area    FAMILY HISTORY Family History  Problem Relation Age of Onset  . Hypertension Other    the patient has little information about her father or his side of the family. The patient's mother died at the age of 26. The patient had 2 brothers and 4 sisters. There is no history of breast or ovarian cancer in the family to her knowledge.  GYNECOLOGIC HISTORY:  No LMP recorded. Patient has had a hysterectomy. The patient does not remember when she started menstruating. First live birth age 18. She is GX P1. She underwent hysterectomy approximately 12 years ago, but does not know whether she had bilateral salpingo-oophorectomy. 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 She is married but separated from her husband [name], who lives in [place]. The patient lives by herself, with no pets. Her daughter [name] works as a Stage manager for [employer] care in [place] the patient has 3 grandchildren and 2 great-grandchildren. She is a Psychologist, forensic.    ADVANCED DIRECTIVES: Not in place; at her 12/28/2013 visit the patient was given the appropriate documents to complete  and notarize so she may declare a healthcare power of attorney at her discretion   HEALTH MAINTENANCE: History  Substance Use Topics  . Smoking status: Former Smoker -- 1.00 packs/day for 30 years    Types: Cigarettes    Quit date: 03/24/2014  . Smokeless tobacco: Never Used     Comment: down to 1-2 cigarettes daily6  . Alcohol Use: Yes     Comment: occ beer     Colonoscopy: Never  PAP: Status post hysterectomy  Bone density: Never  Lipid panel:  Allergies  Allergen Reactions  . Penicillins Other (See Comments)    Reaction a long time ago    Current Outpatient Prescriptions  Medication Sig Dispense Refill  . arformoterol (BROVANA) 15 MCG/2ML NEBU Take 2 mLs (15 mcg total) by nebulization 2 (two) times daily. 120 mL 0  . bisacodyl (DULCOLAX) 10 MG suppository Place 1 suppository (10 mg total) rectally daily as needed for moderate constipation. 12 suppository 0  . budesonide (PULMICORT) 0.5 MG/2ML nebulizer solution Take 2 mLs (0.5 mg total) by nebulization every  12 (twelve) hours. 100 mL 3  . butalbital-acetaminophen-caffeine (FIORICET) 50-325-40 MG per tablet Take 1-2 tablets by mouth every 6 (six) hours as needed for headache. 20 tablet 0  . carbamide peroxide (DEBROX) 6.5 % otic solution Place 5 drops into both ears 2 (two) times daily as needed.    . cholecalciferol (VITAMIN D) 1000 UNITS tablet Take 2,000 Units by mouth daily.    . cyclobenzaprine (FLEXERIL) 5 MG tablet Take 1 tablet (5 mg total) by mouth 3 (three) times daily as needed for muscle spasms. 30 tablet 0  . diltiazem (CARDIZEM CD) 120 MG 24 hr capsule Take 1 capsule (120 mg total) by mouth daily. 30 capsule 2  . doxycycline (VIBRA-TABS) 100 MG tablet Take 1 tablet (100 mg total) by mouth every 12 (twelve) hours. For 4 more days 8 tablet 0  . hydrocortisone (ANUSOL-HC) 25 MG suppository Place 25 mg rectally 2 (two) times daily as needed.    . levalbuterol (XOPENEX) 0.63 MG/3ML nebulizer solution Take 3 mLs (0.63  mg total) by nebulization every 3 (three) hours as needed for wheezing or shortness of breath. 3 mL 12  . lidocaine (LIDODERM) 5 % Place 1 patch onto the skin daily. Remove & Discard patch within 12 hours or as directed by MD 30 patch 0  . lidocaine-prilocaine (EMLA) cream Apply 1 application topically as needed. Apply over port area 1-2 hours before chemo, cover with plastic wrap 30 g 0  . LORazepam (ATIVAN) 0.5 MG tablet Take 1 tablet (0.5 mg total) by mouth every 8 (eight) hours. 30 tablet 0  . magnesium oxide (MAG-OX) 400 (241.3 MG) MG tablet Take 1 tablet (400 mg total) by mouth 2 (two) times daily. 60 tablet 0  . ondansetron (ZOFRAN) 8 MG tablet Take 1 tablet (8 mg total) by mouth 2 (two) times daily. Start the day after chemo for 2 days. Then take as needed for nausea or vomiting. 30 tablet 1  . oxyCODONE-acetaminophen (ROXICET) 5-325 MG per tablet Take 1-2 tablets by mouth every 4 (four) hours as needed. 50 tablet 0  . pantoprazole (PROTONIX) 40 MG tablet Take 1 tablet (40 mg total) by mouth daily. 30 tablet 2  . polyethylene glycol (MIRALAX / GLYCOLAX) packet Take 17 g by mouth 2 (two) times daily. 14 each 0  . potassium chloride SA (K-DUR,KLOR-CON) 20 MEQ tablet Take 2 tablets twice daily for 3 days and then take 2 tablets once daily. 60 tablet 0  . prochlorperazine (COMPAZINE) 10 MG tablet Take 1 tablet (10 mg total) by mouth every 6 (six) hours as needed (Nausea or vomiting). 30 tablet 1   No current facility-administered medications for this visit.    OBJECTIVE: Middle-aged [place] American woman who appears stated age 56 Vitals:   05/08/14 1346  BP: 106/69  Pulse: 108  Temp: 98.3 F (36.8 C)  Resp: 18     Body mass index is 0.00 kg/(m^2).    ECOG FS:3  GENERAL: Patient is an ill appearing female in wheelchari HEENT:  Sclerae anicteric.  Oropharynx clear and slightly dry. No ulcerations or evidence of oropharyngeal candidiasis. Neck is supple.  NODES:  No cervical,  supraclavicular, or axillary lymphadenopathy palpated.  BREAST EXAM:  Deferred. LUNGS:  Diminished in right lower lobe. No wheezes or rhonchi. HEART:  Regular rate and rhythm. No murmur appreciated. ABDOMEN:  Soft, nontender.  Positive, normoactive bowel sounds. No organomegaly palpated. MSK:  No focal spinal tenderness to palpation. Full range of motion bilaterally in the upper  extremities. EXTREMITIES:  No peripheral edema.   SKIN:  Clear with no obvious rashes or skin changes. No nail dyscrasia. NEURO:  Disoriented to conversation, difficulty following commands     LAB RESULTS:  Lab Results  Component Value Date   WBC 10.2 05/03/2014   HGB 7.5* 05/03/2014   HCT 22.6* 05/03/2014   MCV 91.5 05/03/2014   PLT 18* 05/03/2014     Chemistry      Component Value Date/Time   NA 143 05/03/2014 0450   NA 135* 04/10/2014 1428   K 2.9* 05/03/2014 0450   K 4.5 04/10/2014 1428   CL 101 05/03/2014 0450   CO2 27 05/03/2014 0450   CO2 18* 04/10/2014 1428   BUN 6 05/03/2014 0450   BUN 22.6 04/10/2014 1428   CREATININE 0.64 05/03/2014 0450   CREATININE 1.2* 04/10/2014 1428   CREATININE 0.76 10/18/2013 1438      Component Value Date/Time   CALCIUM 11.5* 05/03/2014 0450   CALCIUM 12.8* 04/10/2014 1428   ALKPHOS 94 04/28/2014 0537   ALKPHOS 107 04/10/2014 1428   AST 37 04/28/2014 0537   AST 90* 04/10/2014 1428   ALT 7 04/28/2014 0537   ALT 19 04/10/2014 1428   BILITOT 0.3 04/28/2014 0537   BILITOT 0.49 04/10/2014 1428      STUDIES: Dg Chest 2 View (if Patient Has Fever And/or Copd)  04/03/2014   CLINICAL DATA:  58 year old female with current history of breast cancer diagnosed in July, metastatic disease to brain and bone. Ongoing chemotherapy. Generalized pain including mid chest pain. Shortness of breath and cough. Initial encounter.  EXAM: CHEST  2 VIEW  COMPARISON:  CT chest and CT Abdomen and Pelvis from 0841 hr today, and earlier.  FINDINGS: Stable left chest porta cath.  Lower lung volumes with linear bibasilar opacity which most resembles atelectasis. Right suprahilar mass shows radiographic regression since 12/30/2013. No pneumothorax or pulmonary edema. Suggestion of small pleural effusions on these films, but there was non by CT earlier today. Stable cardiac size and mediastinal contours. Visualized tracheal air column is within normal limits.  Stable visualized osseous structures. Retained oral contrast in the colon.  IMPRESSION: Lower lung volumes with bibasilar atelectasis, and possibly new small pleural effusions since the chest abdomen and pelvis CTs done at 0841 hr today. See also that CT report.   Electronically Signed   By: Lars Pinks M.D.   On: 04/03/2014 20:13   Ct Chest W Contrast  04/03/2014   CLINICAL DATA:  Subsequent treatment strategy for breast cancer. Right breast cancer diagnosed July, 2015 with metastatic disease to brain and bone. Ongoing chemotherapy. Prior left breast cancer in 2009.  EXAM: CT CHEST, ABDOMEN, AND PELVIS WITH CONTRAST  TECHNIQUE: Multidetector CT imaging of the chest, abdomen and pelvis was performed following the standard protocol during bolus administration of intravenous contrast.  CONTRAST:  14m OMNIPAQUE IOHEXOL 300 MG/ML  SOLN  COMPARISON:  Multiple exams, including 01/05/2014  FINDINGS: CT CHEST FINDINGS  5 mm hypodense right thyroid nodule.  Thoracic adenopathy observed with conglomerate right lower paratracheal nodes have a short axis diameter of 1.2 cm (formerly 1.3 cm on the PET-CT of 01/05/2014) on image 18 of series 2. The right upper lobe pulmonary nodule measures 2.2 by 1.9 cm on image 19 of series 4 (formerly 2.3 by 1.7 cm). A right upper paratracheal lymph node measures 0.6 cm in short axis on image 13 of series 2.  A right axillary lymph node which previously had a  short axis parenchymal thickness of 1.2 cm currently has a short axis parenchymal thickness of 0.6 cm on image 12 of series 2 (this excludes the fatty  hilum).  The hypermetabolic right breast mass is less bulky in size and indistinctly marginated on today's exam. The degree of skin thickening and subcutaneous edema in the vicinity of the mass is significantly reduced.  Rim calcified of 1.4 cm lesion with fatty center noted at the 12 o'clock position in the left breast just above the areola.  There is a subcarinal node measuring 1 cm in short axis, image 25 series 2, previously not readily measurable. A right infrahilar node just below the bronchus intermedius measures 1.2 cm in short axis (formerly 1.0 cm) but was not formerly hypermetabolic  A lower paraesophageal lymph node measures 1.0 cm in short axis on image 37 of series 2 (formerly 0.9 cm and not previously discernibly hypermetabolic).  Centrilobular emphysema. The known scattered osseous metastatic lesions throughout the thoracic skeleton rib fairly inconspicuous on CT, and despite being widespread are fairly difficult to compare to prior exams given the lack of easily identified individual lesions. Mild atelectasis or scarring in both lower lobes along the hemidiaphragms.  CT ABDOMEN AND PELVIS FINDINGS  Hepatobiliary: Dilated CBD at 1.7 cm with blunt termination near the ampulla. Dorsal pancreatic duct is not dilated. Equivocal intrahepatic biliary dilatation. Gallbladder surgically absent. Diffuse hepatic steatosis.  Pancreas: Unremarkable  Spleen: Unremarkable  Adrenals/Urinary Tract: Unremarkable  Stomach/Bowel: Sigmoid diverticulosis without active diverticulitis. Prominent stool throughout the colon favors constipation.  Vascular/Lymphatic: Aortoiliac atherosclerotic vascular disease. 0.7 cm left external iliac lymph node, not previously hypermetabolic, similar in size to prior.  Reproductive: Uterus absent.  Ovaries not well seen.  Other: No supplemental non-categorized findings.  Musculoskeletal: As in the chest, there is vague heterogeneity in the osseous structures indicative of osseous  metastatic disease, but difficult to quantify. Bone scan would probably be a better way to followup.  IMPRESSION: 1. Primarily improved appearance, with significant reduction in size of the right breast mass and in the surrounding edema, and considerable reduction in the right hilar and subpectoral adenopathy. 2. Essentially stable appearance of the right upper lobe pulmonary nodule the and similar appearance of the right lower paratracheal adenopathy. This may be a lung primary. 3. Slight enlargement of a right infrahilar lymph node currently 1.2 cm in short axis, but not formerly hypermetabolic. Equivocal enlargement of a lower periesophageal lymph node in the thorax. 4. Aside from the indistinct diffuse osseous metastatic disease, no specific findings of malignancy in the abdomen/pelvis. 5. Centrilobular emphysema. 6. Stably dilated common bile duct with blunt termination near the ampulla. Cannot completely exclude a stricture although a component of the biliary dilatation may be due to prior cholecystectomy. 7. Sigmoid diverticulosis. 8.  Prominent stool throughout the colon favors constipation.   Electronically Signed   By: Sherryl Barters M.D.   On: 04/03/2014 09:12   Mr Jeri Cos CB Contrast  03/31/2014   CLINICAL DATA:  Right breast cancer. Metastatic right axillary lymph node. Metastatic disease the brain. SRS.  EXAM: MRI HEAD WITHOUT AND WITH CONTRAST  TECHNIQUE: Multiplanar, multiecho pulse sequences of the brain and surrounding structures were obtained without and with intravenous contrast.  CONTRAST:  27m MULTIHANCE GADOBENATE DIMEGLUMINE 529 MG/ML IV SOLN  COMPARISON:  CT head without contrast 8/6 teen/ 15 and 01/06/2014.  FINDINGS: The previously noted left frontal lobe mass is decreased in size, now measuring 7 x 11 x 8 mm. Adjacent edema  has decreased as well. Periventricular white matter changes bilaterally are otherwise stable. No additional enhancing lesions are present. No acute infarct or  hemorrhage is evident.  Flow is present in the major intracranial arteries. The globes and orbits are intact. Mild mucosal thickening is present in the anterior ethmoid air cells and frontal sinuses. The mastoid air cells are clear.  IMPRESSION: 1. Decreased size of left frontal lobe mass lesion, now measuring 7 x 11 x 8 mm. 2. No new lesions. 3. Stable periventricular white matter changes.   Electronically Signed   By: Lawrence Santiago M.D.   On: 03/31/2014 14:23   Ct Abdomen Pelvis W Contrast  04/03/2014   CLINICAL DATA:  Subsequent treatment strategy for breast cancer. Right breast cancer diagnosed July, 2015 with metastatic disease to brain and bone. Ongoing chemotherapy. Prior left breast cancer in 2009.  EXAM: CT CHEST, ABDOMEN, AND PELVIS WITH CONTRAST  TECHNIQUE: Multidetector CT imaging of the chest, abdomen and pelvis was performed following the standard protocol during bolus administration of intravenous contrast.  CONTRAST:  5m OMNIPAQUE IOHEXOL 300 MG/ML  SOLN  COMPARISON:  Multiple exams, including 01/05/2014  FINDINGS: CT CHEST FINDINGS  5 mm hypodense right thyroid nodule.  Thoracic adenopathy observed with conglomerate right lower paratracheal nodes have a short axis diameter of 1.2 cm (formerly 1.3 cm on the PET-CT of 01/05/2014) on image 18 of series 2. The right upper lobe pulmonary nodule measures 2.2 by 1.9 cm on image 19 of series 4 (formerly 2.3 by 1.7 cm). A right upper paratracheal lymph node measures 0.6 cm in short axis on image 13 of series 2.  A right axillary lymph node which previously had a short axis parenchymal thickness of 1.2 cm currently has a short axis parenchymal thickness of 0.6 cm on image 12 of series 2 (this excludes the fatty hilum).  The hypermetabolic right breast mass is less bulky in size and indistinctly marginated on today's exam. The degree of skin thickening and subcutaneous edema in the vicinity of the mass is significantly reduced.  Rim calcified of 1.4 cm  lesion with fatty center noted at the 12 o'clock position in the left breast just above the areola.  There is a subcarinal node measuring 1 cm in short axis, image 25 series 2, previously not readily measurable. A right infrahilar node just below the bronchus intermedius measures 1.2 cm in short axis (formerly 1.0 cm) but was not formerly hypermetabolic  A lower paraesophageal lymph node measures 1.0 cm in short axis on image 37 of series 2 (formerly 0.9 cm and not previously discernibly hypermetabolic).  Centrilobular emphysema. The known scattered osseous metastatic lesions throughout the thoracic skeleton rib fairly inconspicuous on CT, and despite being widespread are fairly difficult to compare to prior exams given the lack of easily identified individual lesions. Mild atelectasis or scarring in both lower lobes along the hemidiaphragms.  CT ABDOMEN AND PELVIS FINDINGS  Hepatobiliary: Dilated CBD at 1.7 cm with blunt termination near the ampulla. Dorsal pancreatic duct is not dilated. Equivocal intrahepatic biliary dilatation. Gallbladder surgically absent. Diffuse hepatic steatosis.  Pancreas: Unremarkable  Spleen: Unremarkable  Adrenals/Urinary Tract: Unremarkable  Stomach/Bowel: Sigmoid diverticulosis without active diverticulitis. Prominent stool throughout the colon favors constipation.  Vascular/Lymphatic: Aortoiliac atherosclerotic vascular disease. 0.7 cm left external iliac lymph node, not previously hypermetabolic, similar in size to prior.  Reproductive: Uterus absent.  Ovaries not well seen.  Other: No supplemental non-categorized findings.  Musculoskeletal: As in the chest, there is vague heterogeneity  in the osseous structures indicative of osseous metastatic disease, but difficult to quantify. Bone scan would probably be a better way to followup.  IMPRESSION: 1. Primarily improved appearance, with significant reduction in size of the right breast mass and in the surrounding edema, and  considerable reduction in the right hilar and subpectoral adenopathy. 2. Essentially stable appearance of the right upper lobe pulmonary nodule the and similar appearance of the right lower paratracheal adenopathy. This may be a lung primary. 3. Slight enlargement of a right infrahilar lymph node currently 1.2 cm in short axis, but not formerly hypermetabolic. Equivocal enlargement of a lower periesophageal lymph node in the thorax. 4. Aside from the indistinct diffuse osseous metastatic disease, no specific findings of malignancy in the abdomen/pelvis. 5. Centrilobular emphysema. 6. Stably dilated common bile duct with blunt termination near the ampulla. Cannot completely exclude a stricture although a component of the biliary dilatation may be due to prior cholecystectomy. 7. Sigmoid diverticulosis. 8.  Prominent stool throughout the colon favors constipation.   Electronically Signed   By: Sherryl Barters M.D.   On: 04/03/2014 09:12    ASSESSMENT: 58 y.o. [place] woman with stage IV breast cancer involving brain, lung, and bones   (1) status post right breast biopsy of 2 separate masses in the right axillary lymph node 12/21/2013 showing a clinical T3 N3, stage IIIC invasive ductal carcinoma, triple negative, with an MIB-1 of 85%; staging studies 01/04/2014 show metastatic disease to the brain, lung, and bones.  (2) status post stereotactic radiosurgery to a 1.7 cm left parietal lesion: 20 Gy given 01/13/2014  (3) systemic therapy consists of neoadjuvant eribulin, given days 1 and 8 of each 21 day cycle, started 01/17/2014  (4) zolendronate to be started once the patient's dental condition has been optimized. I am arranging for her to see a dentist.  (5) right mastectomy for local control to be considered once systemic disease comes under good control  (6) also status post left lumpectomy and sentinel lymph node sampling 11/23/2007 for a pTis pN0, stage 0 ductal carcinoma in situ, high-grade,  estrogen and progesterone receptor negative, status post adjuvant radiation  PLAN:  Aairah has unfortunately continued to deteriorate since her discharge from the hospital.  Her labs continue to be low and this is likely the breast cancer's involvement.  She was evaluated by Dr. Jana Hakim who has recommended a referral to hospice.  Her hemoglobin is 7.5 and she will need a blood transfusion.  She will also need prolia because her corrected calcium is 11.7.  I refilled the Percocet and lidocaine patch for her pain.  I instructed her to stop the Magnesium and Potassium supplements as her levels were increased.  I went into great detail about what hospice is and means.  I requested the nurse refer them today.     Total time spent in the appointment was approximately 90 minutes.  More than 50% was spent counseling and reviewing results.    Minette Headland, [place] [phone] 05/08/2014 1:53 PM    ADDENDUM: I met with [name] and her daughter today to discuss her situation. We know that Sonia has disease in the bones and brain. She is currently very confused. Her counts will not allow [place] to treat her systemically. She very likely has diffuse bony infiltration by tumor. At this point I think a palliative strategy is best, and the goals are to make sure she is safe and comfortable. Ideally this can be  accomplished at home with the help of hospice. If not she may need to be cared for in [facility].  The patient's daughter had many appropriate questions, which we discussed. I hope we can have the patient enrolled under hospice before the end of the week. I would like to see her again within 2 weeks to make sure all the supports are in place.  I personally saw this patient and performed a substantive portion of this encounter with the listed APP documented above.   Diana Cruel, MD

## 2014-05-08 NOTE — Telephone Encounter (Signed)
Per staff message and POF I have scheduled appts. Advised scheduler of appts. JMW  

## 2014-05-09 ENCOUNTER — Other Ambulatory Visit: Payer: Self-pay | Admitting: Oncology

## 2014-05-09 ENCOUNTER — Encounter: Payer: Self-pay | Admitting: *Deleted

## 2014-05-09 ENCOUNTER — Encounter: Payer: Self-pay | Admitting: Emergency Medicine

## 2014-05-09 ENCOUNTER — Encounter: Payer: Self-pay | Admitting: Adult Health

## 2014-05-09 NOTE — Progress Notes (Unsigned)
Referral called into [facility] per request.

## 2014-05-09 NOTE — Progress Notes (Signed)
Faxed lidocaine pa form to [facility]

## 2014-05-09 NOTE — Addendum Note (Signed)
Addended by: Minette Headland on: 05/09/2014 10:12 AM   Modules accepted: Orders, SmartSet

## 2014-05-09 NOTE — Progress Notes (Signed)
RECEIVED A FAX FROM [facility] CONCERNING A PRIOR AUTHORIZATION FOR LIDOCAINE 5% PATCH. THIS REQUEST WAS PLACED IN THE MANAGED CARE BIN. 

## 2014-05-10 ENCOUNTER — Ambulatory Visit (HOSPITAL_BASED_OUTPATIENT_CLINIC_OR_DEPARTMENT_OTHER): Payer: Medicaid Other

## 2014-05-10 ENCOUNTER — Other Ambulatory Visit: Payer: Self-pay | Admitting: *Deleted

## 2014-05-10 VITALS — BP 125/57 | HR 98 | Temp 96.9°F | Resp 16

## 2014-05-10 DIAGNOSIS — D6489 Other specified anemias: Secondary | ICD-10-CM

## 2014-05-10 DIAGNOSIS — D619 Aplastic anemia, unspecified: Secondary | ICD-10-CM

## 2014-05-10 DIAGNOSIS — D61818 Other pancytopenia: Secondary | ICD-10-CM

## 2014-05-10 LAB — PREPARE RBC (CROSSMATCH)

## 2014-05-10 MED ORDER — SODIUM CHLORIDE 0.9 % IV SOLN
250.0000 mL | Freq: Once | INTRAVENOUS | Status: AC
  Administered 2014-05-10: 250 mL via INTRAVENOUS

## 2014-05-10 MED ORDER — DIPHENHYDRAMINE HCL 50 MG/ML IJ SOLN
25.0000 mg | Freq: Once | INTRAMUSCULAR | Status: AC
  Administered 2014-05-10: 25 mg via INTRAVENOUS

## 2014-05-10 MED ORDER — ACETAMINOPHEN 325 MG PO TABS: 650.0000 mg | ORAL_TABLET | Freq: Once | ORAL | Status: DC

## 2014-05-10 MED ORDER — DIPHENHYDRAMINE HCL 25 MG PO CAPS
ORAL_CAPSULE | ORAL | Status: AC
  Filled 2014-05-10: qty 1

## 2014-05-10 MED ORDER — ACETAMINOPHEN 160 MG/5ML PO SOLN
650.0000 mg | Freq: Once | ORAL | Status: AC
  Administered 2014-05-10: 650 mg via ORAL
  Filled 2014-05-10: qty 20.3

## 2014-05-10 MED ORDER — DIPHENHYDRAMINE HCL 25 MG PO CAPS: 25.0000 mg | ORAL_CAPSULE | Freq: Once | ORAL | Status: DC

## 2014-05-10 MED ORDER — DIPHENHYDRAMINE HCL 50 MG/ML IJ SOLN
INTRAMUSCULAR | Status: AC
  Filled 2014-05-10: qty 1

## 2014-05-10 MED ORDER — HEPARIN SOD (PORK) LOCK FLUSH 100 UNIT/ML IV SOLN
500.0000 [IU] | Freq: Every day | INTRAVENOUS | Status: AC | PRN
  Administered 2014-05-10: 500 [IU]
  Filled 2014-05-10: qty 5

## 2014-05-10 MED ORDER — ACETAMINOPHEN 325 MG PO TABS
ORAL_TABLET | ORAL | Status: AC
  Filled 2014-05-10: qty 2

## 2014-05-10 MED ORDER — SODIUM CHLORIDE 0.9 % IJ SOLN
10.0000 mL | INTRAMUSCULAR | Status: AC | PRN
  Administered 2014-05-10: 10 mL
  Filled 2014-05-10: qty 10

## 2014-05-10 NOTE — Patient Instructions (Signed)

## 2014-05-11 LAB — TYPE AND SCREEN
ABO/RH(D): O POS
Antibody Screen: NEGATIVE
UNIT DIVISION: 0
Unit division: 0

## 2014-05-13 ENCOUNTER — Other Ambulatory Visit: Payer: Self-pay

## 2014-05-13 ENCOUNTER — Encounter (HOSPITAL_COMMUNITY): Payer: Self-pay | Admitting: Oncology

## 2014-05-13 ENCOUNTER — Inpatient Hospital Stay (HOSPITAL_COMMUNITY)
Admission: EM | Admit: 2014-05-13 | Discharge: 2014-05-16 | DRG: 871 | Disposition: E | Attending: Internal Medicine | Admitting: Internal Medicine

## 2014-05-13 DIAGNOSIS — Z515 Encounter for palliative care: Secondary | ICD-10-CM

## 2014-05-13 DIAGNOSIS — F419 Anxiety disorder, unspecified: Secondary | ICD-10-CM | POA: Diagnosis present

## 2014-05-13 DIAGNOSIS — Z66 Do not resuscitate: Secondary | ICD-10-CM | POA: Diagnosis present

## 2014-05-13 DIAGNOSIS — F1010 Alcohol abuse, uncomplicated: Secondary | ICD-10-CM | POA: Diagnosis present

## 2014-05-13 DIAGNOSIS — J45909 Unspecified asthma, uncomplicated: Secondary | ICD-10-CM | POA: Diagnosis present

## 2014-05-13 DIAGNOSIS — J302 Other seasonal allergic rhinitis: Secondary | ICD-10-CM | POA: Diagnosis present

## 2014-05-13 DIAGNOSIS — R4182 Altered mental status, unspecified: Secondary | ICD-10-CM | POA: Insufficient documentation

## 2014-05-13 DIAGNOSIS — A419 Sepsis, unspecified organism: Principal | ICD-10-CM | POA: Diagnosis present

## 2014-05-13 DIAGNOSIS — C7931 Secondary malignant neoplasm of brain: Secondary | ICD-10-CM | POA: Diagnosis present

## 2014-05-13 DIAGNOSIS — Z880 Allergy status to penicillin: Secondary | ICD-10-CM

## 2014-05-13 DIAGNOSIS — J449 Chronic obstructive pulmonary disease, unspecified: Secondary | ICD-10-CM | POA: Diagnosis present

## 2014-05-13 DIAGNOSIS — C50919 Malignant neoplasm of unspecified site of unspecified female breast: Secondary | ICD-10-CM | POA: Diagnosis present

## 2014-05-13 DIAGNOSIS — K920 Hematemesis: Secondary | ICD-10-CM | POA: Diagnosis present

## 2014-05-13 DIAGNOSIS — R000 Tachycardia, unspecified: Secondary | ICD-10-CM

## 2014-05-13 DIAGNOSIS — K219 Gastro-esophageal reflux disease without esophagitis: Secondary | ICD-10-CM | POA: Diagnosis present

## 2014-05-13 DIAGNOSIS — C7800 Secondary malignant neoplasm of unspecified lung: Secondary | ICD-10-CM | POA: Diagnosis present

## 2014-05-13 DIAGNOSIS — R509 Fever, unspecified: Secondary | ICD-10-CM | POA: Insufficient documentation

## 2014-05-13 DIAGNOSIS — G9340 Encephalopathy, unspecified: Secondary | ICD-10-CM | POA: Diagnosis present

## 2014-05-13 DIAGNOSIS — Z87891 Personal history of nicotine dependence: Secondary | ICD-10-CM

## 2014-05-13 MED ORDER — LORAZEPAM 2 MG/ML IJ SOLN
1.0000 mg | INTRAMUSCULAR | Status: DC | PRN
  Administered 2014-05-14: 1 mg via INTRAVENOUS
  Filled 2014-05-13: qty 1

## 2014-05-13 MED ORDER — MORPHINE SULFATE 4 MG/ML IJ SOLN
4.0000 mg | Freq: Once | INTRAMUSCULAR | Status: AC
  Administered 2014-05-13: 4 mg via INTRAVENOUS
  Filled 2014-05-13: qty 1

## 2014-05-13 MED ORDER — MORPHINE SULFATE 2 MG/ML IJ SOLN
2.0000 mg | Freq: Once | INTRAMUSCULAR | Status: DC
  Filled 2014-05-13: qty 1

## 2014-05-13 MED ORDER — MORPHINE SULFATE 4 MG/ML IJ SOLN
4.0000 mg | INTRAMUSCULAR | Status: DC | PRN
  Administered 2014-05-14: 4 mg via INTRAVENOUS
  Filled 2014-05-13 (×2): qty 1

## 2014-05-13 NOTE — ED Notes (Signed)
EKG given to [place] for review

## 2014-05-13 NOTE — ED Notes (Signed)
Bed: RESB Expected date:  Expected time:  Means of arrival:  Comments: EMS Cancer pt, vomiting blood, dec LOC

## 2014-05-13 NOTE — ED Notes (Signed)
Per EDP Ward, hold off on rectal temp until further notice. RN notified

## 2014-05-13 NOTE — ED Provider Notes (Signed)
TIME SEEN: 8:15 PM  CHIEF COMPLAINT: Fever, altered mental status, vomiting blood  HPI: Pt is a 58 y.o. F with history of stage IV breast cancer, COPD, recent admission for sepsis who is currently receiving hospice at home who presents the emergency department with altered mental status, fever, tachycardia and tachypnea, possible hematemesis versus hemoptysis that started today. Family reports her last chemotherapy was in October 2015. There are report that her oncologist as discussed with them that she has a poor prognosis and they are no longer proceeding with chemotherapy or radiation as it is not helping. Hospice has recently started coming to the patient's home. She is being cared for by her daughter. They report today she had an episode of "gagging up" a large blood clot. They're not sure if she coughed this blood up or if she vomited it. They deny that she's had any known hematochezia or melena. Patient is normally oriented and able to answer questions.  ROS: Level V caveat for altered mental status    PAST MEDICAL HISTORY/PAST SURGICAL HISTORY:  Past Medical History  Diagnosis Date  . Acid reflux   . Asthma   . COPD (chronic obstructive pulmonary disease)   . Cancer     lumpectomy, radiation 2009  . Seasonal allergies   . Alcohol abuse   . Elevated d-dimer     negative chest CT  . Allergy   . Emphysema of lung   . Hot flashes   . Reading disorder   . Impaired writing skills   . Wears glasses   . Wears dentures     top  . HOH (hard of hearing)     left ear  . S/P radiation therapy 01/13/14    SRS left parietal 20Gy    MEDICATIONS:  Prior to Admission medications   Medication Sig Start Date End Date Taking? Authorizing Provider  arformoterol (BROVANA) 15 MCG/2ML NEBU Take 2 mLs (15 mcg total) by nebulization 2 (two) times daily. 04/18/14   Belkys A Regalado, MD  bisacodyl (DULCOLAX) 10 MG suppository Place 1 suppository (10 mg total) rectally daily as needed for moderate  constipation. 04/18/14   Belkys A Regalado, MD  budesonide (PULMICORT) 0.5 MG/2ML nebulizer solution Take 2 mLs (0.5 mg total) by nebulization every 12 (twelve) hours. Patient not taking: Reported on 05/08/2014 04/18/14   Belkys A Regalado, MD  butalbital-acetaminophen-caffeine (FIORICET) 50-325-40 MG per tablet Take 1-2 tablets by mouth every 6 (six) hours as needed for headache. Patient not taking: Reported on 05/08/2014 01/29/14 01/29/15  Minka Knight N Dandrea Widdowson, DO  carbamide peroxide (DEBROX) 6.5 % otic solution Place 5 drops into both ears 2 (two) times daily as needed.    Historical Provider, MD  cholecalciferol (VITAMIN D) 1000 UNITS tablet Take 2,000 Units by mouth daily.    Historical Provider, MD  cyclobenzaprine (FLEXERIL) 5 MG tablet Take 1 tablet (5 mg total) by mouth 3 (three) times daily as needed for muscle spasms. Patient not taking: Reported on 05/08/2014 03/27/14   Marcelino Duster, NP  diltiazem (CARDIZEM CD) 120 MG 24 hr capsule Take 1 capsule (120 mg total) by mouth daily. 05/03/14   Bonnielee Haff, MD  hydrocortisone (ANUSOL-HC) 25 MG suppository Place 25 mg rectally 2 (two) times daily as needed. 01/23/14   Marcelino Duster, NP  levalbuterol Penne Lash) 0.63 MG/3ML nebulizer solution Take 3 mLs (0.63 mg total) by nebulization every 3 (three) hours as needed for wheezing or shortness of breath. Patient not taking: Reported on 05/08/2014 04/18/14  Belkys A Regalado, MD  lidocaine (LIDODERM) 5 % Place 1 patch onto the skin daily. Remove & Discard patch within 12 hours or as directed by MD 05/08/14   Minette Headland, NP  lidocaine-prilocaine (EMLA) cream Apply 1 application topically as needed. Apply over port area 1-2 hours before chemo, cover with plastic wrap Patient not taking: Reported on 05/08/2014 01/14/14   Chauncey Cruel, MD  LORazepam (ATIVAN) 0.5 MG tablet Take 1 tablet (0.5 mg total) by mouth every 8 (eight) hours. Patient not taking: Reported on 05/08/2014 03/27/14   Marcelino Duster, NP  magnesium oxide (MAG-OX) 400 (241.3 MG) MG tablet Take 1 tablet (400 mg total) by mouth 2 (two) times daily. 05/03/14   Bonnielee Haff, MD  ondansetron (ZOFRAN) 8 MG tablet Take 1 tablet (8 mg total) by mouth 2 (two) times daily. Start the day after chemo for 2 days. Then take as needed for nausea or vomiting. Patient not taking: Reported on 05/08/2014 01/16/14   Chauncey Cruel, MD  oxyCODONE-acetaminophen (ROXICET) 5-325 MG per tablet Take 1-2 tablets by mouth every 4 (four) hours as needed. 05/08/14   Minette Headland, NP  pantoprazole (PROTONIX) 40 MG tablet Take 1 tablet (40 mg total) by mouth daily. 01/23/14   Marcelino Duster, NP  polyethylene glycol (MIRALAX / GLYCOLAX) packet Take 17 g by mouth 2 (two) times daily. Patient not taking: Reported on 05/08/2014 04/18/14   Belkys A Regalado, MD  potassium chloride SA (K-DUR,KLOR-CON) 20 MEQ tablet Take 2 tablets twice daily for 3 days and then take 2 tablets once daily. 05/03/14   Bonnielee Haff, MD  prochlorperazine (COMPAZINE) 10 MG tablet Take 1 tablet (10 mg total) by mouth every 6 (six) hours as needed (Nausea or vomiting). Patient not taking: Reported on 05/08/2014 01/16/14   Chauncey Cruel, MD    ALLERGIES:  Allergies  Allergen Reactions  . Penicillins Other (See Comments)    Reaction a long time ago    SOCIAL HISTORY:  History  Substance Use Topics  . Smoking status: Former Smoker -- 1.00 packs/day for 30 years    Types: Cigarettes    Quit date: 03/24/2014  . Smokeless tobacco: Never Used     Comment: down to 1-2 cigarettes daily6  . Alcohol Use: Yes     Comment: occ beer    FAMILY HISTORY: Family History  Problem Relation Age of Onset  . Hypertension Other     EXAM: BP 130/75 mmHg  Pulse 148  Temp(Src) 99 F (37.2 C) (Oral)  Resp 28  SpO2 96% CONSTITUTIONAL: Alert but unable to answer questions or follow commands, cachectic, chronically ill-appearing HEAD: Normocephalic EYES: Conjunctivae  clear, PERRL ENT: normal nose; no rhinorrhea; moist mucous membranes; pharynx without lesions noted NECK: Supple, no meningismus, no LAD  CARD: Regular and tachycardic; S1 and S2 appreciated; no murmurs, no clicks, no rubs, no gallops RESP: Normal chest excursion without splintig; patient is tachypneic, mildly hypoxic, diminished aeration at her bases bilaterally, no rhonchi or wheezing or rails ABD/GI: Normal bowel sounds; non-distended; soft, non-tender, no rebound, no guarding BACK:  The back appears normal and is non-tender to palpation, there is no CVA tenderness EXT: Normal ROM in all joints; non-tender to palpation; no edema; normal capillary refill; no cyanosis    SKIN: Normal color for age and race; warm NEURO: Patient will open her eyes spontaneously but very rarely, mild movements of all 4 extremities, does not follow commands or answer questions  MEDICAL DECISION MAKING: Pt here with possible sepsis, GI bleed versus hemoptysis. She is a cancer patient currently on home hospice. Discussed with patient's daughter at bedside who is her power of attorney who at this time would like to keep the patient Comfort Care only, DO NOT RESUSCITATE/DO NOT INTUBATE. She would however like to discuss this with her other family members.  ED PROGRESS: 9:48 PM  Pt's family is now all at bedside and they have definitely decided that they would like to keep the patient comfort care only. They feel that any further measures would be prolonging the inevitable. We'll give morphine. There does not feel she can continue to care for her at home and would like her to be admitted to the hospital with possible placement later at an inpatient hospice facility like [facility]. We'll discuss with hospitalist for admission for comfort care. Chaplain at bedside.  10:23 PM  D/w Dr. Jannifer Franklin for admission for comfort care to medical bed, inpt.    Date: 05/06/2014 19:44  Rate: 147  Rhythm: Sinus tachycardia, PVCs  QRS  Axis: normal  Intervals: normal  ST/T Wave abnormalities: normal  Conduction Disutrbances: none  Narrative Interpretation: Sinus tachycardia, baseline wander of inferior leads      [place], DO 04/29/2014 2224

## 2014-05-14 DIAGNOSIS — Z515 Encounter for palliative care: Secondary | ICD-10-CM

## 2014-05-14 DIAGNOSIS — R509 Fever, unspecified: Secondary | ICD-10-CM

## 2014-05-14 DIAGNOSIS — C7931 Secondary malignant neoplasm of brain: Secondary | ICD-10-CM | POA: Diagnosis not present

## 2014-05-14 DIAGNOSIS — C50919 Malignant neoplasm of unspecified site of unspecified female breast: Secondary | ICD-10-CM | POA: Diagnosis not present

## 2014-05-14 DIAGNOSIS — R4182 Altered mental status, unspecified: Secondary | ICD-10-CM | POA: Insufficient documentation

## 2014-05-14 MED ORDER — ACETAMINOPHEN 325 MG PO TABS: 650.0000 mg | ORAL_TABLET | Freq: Four times a day (QID) | ORAL | Status: DC | PRN

## 2014-05-14 MED ORDER — LORAZEPAM 2 MG/ML IJ SOLN
1.0000 mg | INTRAMUSCULAR | Status: DC | PRN
  Administered 2014-05-14 (×2): 1 mg via INTRAVENOUS
  Filled 2014-05-14 (×2): qty 1

## 2014-05-14 MED ORDER — ONDANSETRON 8 MG/NS 50 ML IVPB
8.0000 mg | Freq: Four times a day (QID) | INTRAVENOUS | Status: DC | PRN
  Filled 2014-05-14: qty 8

## 2014-05-14 MED ORDER — CETYLPYRIDINIUM CHLORIDE 0.05 % MT LIQD
7.0000 mL | Freq: Two times a day (BID) | OROMUCOSAL | Status: DC
  Administered 2014-05-14: 7 mL via OROMUCOSAL

## 2014-05-14 MED ORDER — ACETAMINOPHEN 650 MG RE SUPP
650.0000 mg | Freq: Four times a day (QID) | RECTAL | Status: DC | PRN
  Administered 2014-05-14: 650 mg via RECTAL
  Filled 2014-05-14: qty 1

## 2014-05-14 MED ORDER — SODIUM CHLORIDE 0.9 % IV SOLN
1.0000 mg/h | INTRAVENOUS | Status: DC
  Administered 2014-05-14: 3 mg/h via INTRAVENOUS
  Filled 2014-05-14: qty 10

## 2014-05-14 NOTE — Progress Notes (Signed)
[facility] PROGRESS NOTE  [name] LAG:[id number] DOB: 1956-05-14 DOA: 05/09/2014 PCP: Lorayne Marek, MD  Assessment/Plan: 1-Stage IV Breast cancer, metastasis to brain, lung, bones Readmitted with AMS, encephalopathy, Hematemesis. Per daughter patient has been decline for last couples of days. No eating, no very conversant. Patient recently receiving hospice care at home. They met with Dr Kathi Der, and it was decided not to proceed with more Chemotherapy.  ED physician and admitting physician discussed with family, and family decide full comfort measure. I spoke with daughter she confirm to full comfort care.  -Continue with morphine Gtt.  -IV ativan PRN for anxiety, agitation.  -Dr Kathi Der added to rounding list.  -SW consulted, for residential hospice.     Code Status: DNR.  Family Communication: Care discussed with Daughter.  Disposition Plan: Poor prognosis, expect hospital death.    Consultants:  Dr Kathi Der added to rounding team.   HPI/Subjective: Patient sedated, appears comfortable.  Daughter at bedside, and confirm goals for comfort care.   Objective: Filed Vitals:   04/20/2014 0553  BP: 82/50  Pulse: 149  Temp: 102.2 F (39 C)  Resp: 31    Intake/Output Summary (Last 24 hours) at 05/12/2014 0930 Last data filed at 05/01/2014 0602  Gross per 24 hour  Intake  14.48 ml  Output      0 ml  Net  14.48 ml   There were no vitals filed for this visit.  Exam:   General:  Cachetic, appears comfortable.   Cardiovascular: S 1, S 2 RRR  Respiratory: Decrease breath sounds.   Abdomen: Bs decrease, NT  Musculoskeletal: no edema.    Data Reviewed: Basic Metabolic Panel:  Recent Labs Lab 05/08/14 1315  NA 145  K 4.9  CO2 20*  GLUCOSE 117  BUN 27.2*  CREATININE 1.3*  CALCIUM 10.3   Liver Function Tests:  Recent Labs Lab 05/08/14 1315  AST 76*  ALT 7  ALKPHOS 128  BILITOT 0.21  PROT 7.6  ALBUMIN 2.3*   No results for input(s):  LIPASE, AMYLASE in the last 168 hours. No results for input(s): AMMONIA in the last 168 hours. CBC:  Recent Labs Lab 05/08/14 1314  WBC 8.8  NEUTROABS 5.4  HGB 7.6*  HCT 25.2*  MCV 95.1  PLT 23*   Cardiac Enzymes: No results for input(s): CKTOTAL, CKMB, CKMBINDEX, TROPONINI in the last 168 hours. BNP (last 3 results)  Recent Labs  04/10/14 1537  PROBNP 298.3*   CBG: No results for input(s): GLUCAP in the last 168 hours.  Recent Results (from the past 240 hour(s))  TECHNOLOGIST REVIEW     Status: None   Collection Time: 05/08/14  1:14 PM  Result Value Ref Range Status   Technologist Review   Final    Few Metas and Myelos, 30% NRBCs, Helmets and Fragments     Studies: No results found.  Scheduled Meds: . antiseptic oral rinse  7 mL Mouth Rinse BID   Continuous Infusions: . morphine 3 mg/hr (05/06/2014 0307)    Principal Problem:   Breast cancer metastasized to brain Active Problems:   Comfort measures only status    Time spent: 30 minutes.     Niel Hummer A  [facility] Pager [phone]. If 7PM-7AM, please contact night-coverage at [email or web address], password [facility] 04/22/2014, 9:30 AM  LOS: 1 day

## 2014-05-14 NOTE — Progress Notes (Signed)
Following abstracted as background from Dr. Adela Ports' note:  "[name] is a 58 y.o. female with stage IV end stage breast cancer who presents from home hospice after "vomiting up blood clot" per family. Family states she was generally uncomfortable at home on hospice. They brought her in to hospital today for above complaint, but on discussion want comfort measures and then possible inpatient hospice or admission to hospice facility. Pt does not participate in interview as she is minimally conversant and not oriented."  HCPOA is daughter [name].  When Chaplain arrived originally ion the ED, present were the daughter, patient's brother and grand daughter (Tracy's daughter), a family friend (female) and later the youngest sister.  In the conference room were several (about 8-10) other family/friends.  The family of the patient is 5 siblings, one already gone from heart disease.  Patient was somewhat "wriggly" and needed repositioning for comfort.  ED nurse was excellent in her care for patient and family.  Chaplain spoke in private to brother and daughter.  Daughter states that she had called the Hospice nurse to come to the house to visit with her mother (patient) for admission to [facility], and shortly after dialed 911 for physical issues that began to occur in the interim.    Prayed with family.  [place] daughter reports patient as stating to her, "I'll be OK."  Family aware that such a statement could have multiple meanings.  Loann Quill, Chaplain Pager: [phone]

## 2014-05-14 NOTE — Progress Notes (Signed)
DNR. Patient was admitted to hospice services on 05/09/14 with dx of breast ca with mets to brain, bone and lungs.  This is a related admission.  Daughter [name]  is at the bedside.  Very tearful in discussing patient's condition.  Family has elected to do comfort care only.  Patient is currently on a morphine drip and appears very comfortable.  Per chart review, [facility] was discussed.  I have contacted Darleene Cleaver, SW for [facility] regarding this desire.  Please contact [place] with any questions or concerns.  Vance Gather, RN [facility]

## 2014-05-14 NOTE — Progress Notes (Signed)
Patient arrived on the unit at approximately 0055 accompanied by relatives. Patient not verbally responsive but responds to pain stimulus. Respirations and pulse rate rapid. Family expressed concerns that patient was not on a Morphine drip as they were told in the ED. Page sent to NP Rogue Bussing and Morphine drip 1mg /ml ordered to be titrated up to 5 mg. Drip was started at 3mg /hr at approximately 0307. Will monitor response and titrate as needed.

## 2014-05-14 NOTE — Plan of Care (Signed)
Problem: Phase I Progression Outcomes Goal: Oral assessment and care per protocol Outcome: Progressing

## 2014-05-14 NOTE — Plan of Care (Signed)
Problem: Consults Goal: Diabetes Guidelines if Diabetic/Glucose > 140 If diabetic or lab glucose is > 140 mg/dl - Initiate Diabetes/Hyperglycemia Guidelines & Document Interventions  Outcome: Not Applicable Date Met:  16/94/50

## 2014-05-14 NOTE — Progress Notes (Addendum)
Called to room to evaluate if patient had passed, assessed pt, no heart beat or respirations noted, second RN Clearnce Sorrel, listened, same assessment.  Pt pronounced and Kathline Magic, NP notified for death certificate.  [facility] notified, eyes prepped in case family donates.  Fara Olden P

## 2014-05-14 NOTE — H&P (Signed)
[facility] History and Physical  [name] PHX:[id number] DOB: 08-Oct-1955 DOA: 05/01/2014  Referring physician: Pryor Curia, MD  PCP: Lorayne Marek, MD   Chief Complaint: End stage cancer  HPI: [name] is a 58 y.o. female with stage IV end stage breast cancer who presents from home hospice after "vomiting up blood clot" per family.  Family states she was generally uncomfortable at home on hospice.  They brought her in to hospital today for above complaint, but on discussion want comfort measures and then possible inpatient hospice or admission to hospice facility.  Pt does not participate in interview as she is minimally conversant and not oriented.     Review of Systems:  Unable to obtain due to patient's altered mental status associated with her current condition (terminal metastatic breast cancer)  Past Medical History  Diagnosis Date  . Acid reflux   . Asthma   . COPD (chronic obstructive pulmonary disease)   . Cancer     lumpectomy, radiation 2009  . Seasonal allergies   . Alcohol abuse   . Elevated d-dimer     negative chest CT  . Allergy   . Emphysema of lung   . Hot flashes   . Reading disorder   . Impaired writing skills   . Wears glasses   . Wears dentures     top  . HOH (hard of hearing)     left ear  . S/P radiation therapy 01/13/14    SRS left parietal 20Gy   Past Surgical History  Procedure Laterality Date  . Breast lumpectomy    . Abdominal hysterectomy    . Portacath placement Left 12/30/2013    Procedure: INSERTION PORT-A-CATH;  Surgeon: Merrie Roof, MD;  Location: [facility];  Service: General;  Laterality: Left;  subclavian area   Social History:  reports that she quit smoking about 7 weeks ago. Her smoking use included Cigarettes. She has a 30 pack-year smoking history. She has never used smokeless tobacco. She reports that she drinks alcohol. She reports that she does not use illicit drugs.  Allergies    Allergen Reactions  . Penicillins Other (See Comments)    Reaction a long time ago    Family History  Problem Relation Age of Onset  . Hypertension Other      Prior to Admission medications   Medication Sig Start Date End Date Taking? Authorizing Provider  LORazepam (ATIVAN) 0.5 MG tablet Take 1 tablet (0.5 mg total) by mouth every 8 (eight) hours. 03/27/14  Yes Marcelino Duster, NP  MORPHINE SULFATE PO Take by mouth. Patient is on liquid morphine dose unknown   Yes Historical Provider, MD  oxyCODONE-acetaminophen (ROXICET) 5-325 MG per tablet Take 1-2 tablets by mouth every 4 (four) hours as needed. 05/08/14  Yes Minette Headland, NP  arformoterol (BROVANA) 15 MCG/2ML NEBU Take 2 mLs (15 mcg total) by nebulization 2 (two) times daily. Patient not taking: Reported on 05/08/2014 04/18/14   Belkys A Regalado, MD  bisacodyl (DULCOLAX) 10 MG suppository Place 1 suppository (10 mg total) rectally daily as needed for moderate constipation. 04/18/14   Belkys A Regalado, MD  budesonide (PULMICORT) 0.5 MG/2ML nebulizer solution Take 2 mLs (0.5 mg total) by nebulization every 12 (twelve) hours. Patient not taking: Reported on 05/08/2014 04/18/14   Belkys A Regalado, MD  butalbital-acetaminophen-caffeine (FIORICET) 50-325-40 MG per tablet Take 1-2 tablets by mouth every 6 (six) hours as needed for headache. Patient not taking: Reported on  05/08/2014 01/29/14 01/29/15  Kristen N Ward, DO  cyclobenzaprine (FLEXERIL) 5 MG tablet Take 1 tablet (5 mg total) by mouth 3 (three) times daily as needed for muscle spasms. Patient not taking: Reported on 05/08/2014 03/27/14   Marcelino Duster, NP  diltiazem (CARDIZEM CD) 120 MG 24 hr capsule Take 1 capsule (120 mg total) by mouth daily. Patient not taking: Reported on 05/11/2014 05/03/14   Bonnielee Haff, MD  hydrocortisone (ANUSOL-HC) 25 MG suppository Place 25 mg rectally 2 (two) times daily as needed. 01/23/14   Marcelino Duster, NP  levalbuterol Penne Lash)  0.63 MG/3ML nebulizer solution Take 3 mLs (0.63 mg total) by nebulization every 3 (three) hours as needed for wheezing or shortness of breath. Patient not taking: Reported on 05/08/2014 04/18/14   Belkys A Regalado, MD  lidocaine (LIDODERM) 5 % Place 1 patch onto the skin daily. Remove & Discard patch within 12 hours or as directed by MD Patient not taking: Reported on 05/03/2014 05/08/14   Minette Headland, NP  lidocaine-prilocaine (EMLA) cream Apply 1 application topically as needed. Apply over port area 1-2 hours before chemo, cover with plastic wrap Patient not taking: Reported on 05/03/2014 01/14/14   Chauncey Cruel, MD  magnesium oxide (MAG-OX) 400 (241.3 MG) MG tablet Take 1 tablet (400 mg total) by mouth 2 (two) times daily. Patient not taking: Reported on 05/11/2014 05/03/14   Bonnielee Haff, MD  ondansetron (ZOFRAN) 8 MG tablet Take 1 tablet (8 mg total) by mouth 2 (two) times daily. Start the day after chemo for 2 days. Then take as needed for nausea or vomiting. Patient not taking: Reported on 05/08/2014 01/16/14   Chauncey Cruel, MD  pantoprazole (PROTONIX) 40 MG tablet Take 1 tablet (40 mg total) by mouth daily. Patient not taking: Reported on 05/11/2014 01/23/14   Marcelino Duster, NP  polyethylene glycol (MIRALAX / GLYCOLAX) packet Take 17 g by mouth 2 (two) times daily. Patient not taking: Reported on 05/08/2014 04/18/14   Belkys A Regalado, MD  potassium chloride SA (K-DUR,KLOR-CON) 20 MEQ tablet Take 2 tablets twice daily for 3 days and then take 2 tablets once daily. Patient not taking: Reported on 04/17/2014 05/03/14   Bonnielee Haff, MD  prochlorperazine (COMPAZINE) 10 MG tablet Take 1 tablet (10 mg total) by mouth every 6 (six) hours as needed (Nausea or vomiting). Patient not taking: Reported on 05/08/2014 01/16/14   Chauncey Cruel, MD   Physical Exam: Filed Vitals:   04/24/2014 2230 04/27/2014 2300 05/09/2014 2330 05/13/2014 0000  BP: 107/61 99/61 103/69 94/65  Pulse: 147  147 147 148  Temp:      TempSrc:      Resp: 29 27 31  34  SpO2: 96% 95% 96% 95%    Wt Readings from Last 3 Encounters:  04/29/14 52.9 kg (116 lb 10 oz)  04/15/14 52.2 kg (115 lb 1.3 oz)  04/05/14 52.39 kg (115 lb 8 oz)    General:  Supine in bed with eyes closed Eyes: PERRL, normal lids, irises & conjunctiva ENT: grossly normal hearing, lips & tongue Neck: no LAD, masses or thyromegaly Cardiovascular: regular rate, no m/r/g. No LE edema. Telemetry: SR, no arrhythmias  Respiratory: CTA bilaterally, no w/r/r. Normal respiratory effort. Abdomen: soft, ntnd Skin: no rash or induration seen on limited exam Musculoskeletal: grossly normal tone BUE/BLE Psychiatric: unable to fully assess due to patient's altered mental status Neurologic: unable to fully assess due to patient's altered mental status  Labs on Admission:  Basic Metabolic Panel:  Recent Labs Lab 05/08/14 1315  NA 145  K 4.9  CO2 20*  GLUCOSE 117  BUN 27.2*  CREATININE 1.3*  CALCIUM 10.3   Liver Function Tests:  Recent Labs Lab 05/08/14 1315  AST 76*  ALT 7  ALKPHOS 128  BILITOT 0.21  PROT 7.6  ALBUMIN 2.3*   No results for input(s): LIPASE, AMYLASE in the last 168 hours. No results for input(s): AMMONIA in the last 168 hours. CBC:  Recent Labs Lab 05/08/14 1314  WBC 8.8  NEUTROABS 5.4  HGB 7.6*  HCT 25.2*  MCV 95.1  PLT 23*   Cardiac Enzymes: No results for input(s): CKTOTAL, CKMB, CKMBINDEX, TROPONINI in the last 168 hours.  BNP (last 3 results)  Recent Labs  04/10/14 1537  PROBNP 298.3*   CBG: No results for input(s): GLUCAP in the last 168 hours.  Radiological Exams on Admission: No results found.  Assessment/Plan Principal Problem:   Breast cancer metastasized to brain Active Problems:   Comfort measures only status   1. Terminal metastatic breast cancer - no further chemo to be done.  Family desires comfort measures  - comfort orders placed.    - morphine 4  mg q2 hours, can escalate to PCA pump if needed  Code Status: DNR (must indicate code status--if unknown or must be presumed, indicate so) DVT Prophylaxis: None Family Communication: Daughter Disposition Plan: admit to [facility], then possibly Hospice center   Time spent: [place], [facility] Pager [phone]

## 2014-05-14 NOTE — Plan of Care (Signed)
Problem: Phase I Progression Outcomes Goal: Swallow eval if indicated Outcome: Not Applicable Date Met:  02/54/86

## 2014-05-15 ENCOUNTER — Other Ambulatory Visit: Payer: Self-pay | Admitting: Oncology

## 2014-05-16 NOTE — Discharge Summary (Signed)
Death Summary  [name] QAS:[id number] DOB: 1955-08-08 DOA: 05/28/2014  PCP: Lorayne Marek, MD PCP/Office notified: summary route to Dr Jana Hakim.    Admit date: 05-28-2014 Date of Death: May 31, 2014  Final Diagnoses:    Breast cancer metastasized to brain, lung and bones.   AMS, Encephalopathy   Sepsis   Comfort measures only status   Altered mental status   Pyrexia    History of present illness:    Hospital Course:  1-Stage IV Breast cancer, metastasis to brain, lung, bones Readmitted with AMS, encephalopathy, Hematemesis. Per daughter patient has being  Declining  for last couples of days. No eating, no very conversant. Patient recently receiving hospice care at home. They met with Dr Jana Hakim, and it was decided not to proceed with more Chemotherapy.  ED physician and admitting physician discussed with family, and family decide full comfort measure. I spoke with daughter she confirm  full comfort care.  -Continue with morphine Gtt.  -IV ativan PRN for anxiety, agitation.  -Patient died May 30, 2023 at 60 Pm.    Time: 30 minutes.    Signed:  Niel Hummer A  [facility] 05/31/14, 8:45 PM

## 2014-05-16 DEATH — deceased

## 2014-05-17 ENCOUNTER — Other Ambulatory Visit: Payer: Self-pay | Admitting: Oncology

## 2014-05-22 ENCOUNTER — Ambulatory Visit: Payer: Medicaid Other | Admitting: Oncology

## 2014-05-22 ENCOUNTER — Other Ambulatory Visit: Payer: Medicaid Other

## 2014-05-27 NOTE — Telephone Encounter (Signed)
No entry 

## 2014-05-29 ENCOUNTER — Other Ambulatory Visit: Payer: Medicaid Other

## 2014-07-10 ENCOUNTER — Other Ambulatory Visit: Payer: Medicaid Other

## 2014-07-12 ENCOUNTER — Ambulatory Visit: Payer: Medicaid Other | Admitting: Radiation Oncology

## 2015-02-12 IMAGING — CT CT CHEST W/ CM
2 of 5 series · 15 of 46 positions shown, 17 images · IV contrast (omnipaque)
Comparison: Multiple exams, including 01/05/2014

CLINICAL DATA: Subsequent treatment strategy for breast cancer.
Right breast cancer diagnosed December 2013 with metastatic disease to
brain and bone. Ongoing chemotherapy. Prior left breast cancer in
5881.

EXAM:
CT CHEST, ABDOMEN, AND PELVIS WITH CONTRAST
TECHNIQUE: Multidetector CT imaging of the chest, abdomen and pelvis was
performed following the standard protocol during bolus
administration of intravenous contrast.
CONTRAST:  80mL OMNIPAQUE IOHEXOL 300 MG/ML  SOLN

[Series 2: cap with st · axial · 0.62mm/px · z∈[+728,+1228]mm · 12 of 114 slices shown, 14 images]
[im 7/114  soft-tissue]
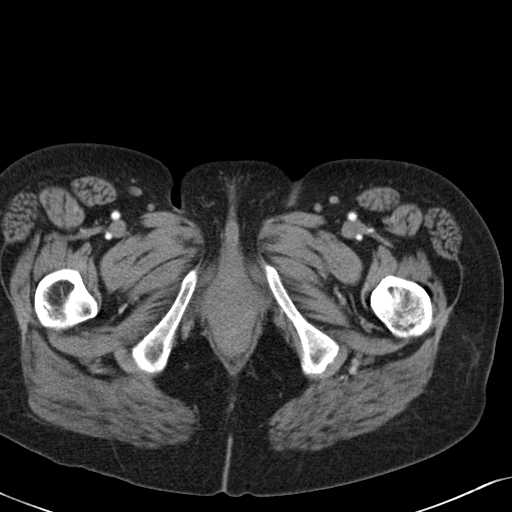
[im 7/114  bone]
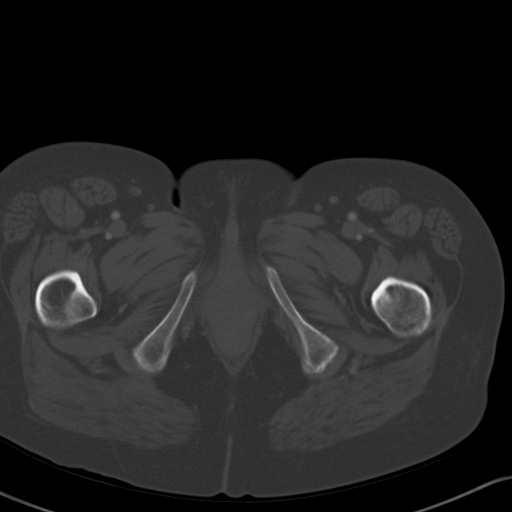
[im 19/114  soft-tissue]
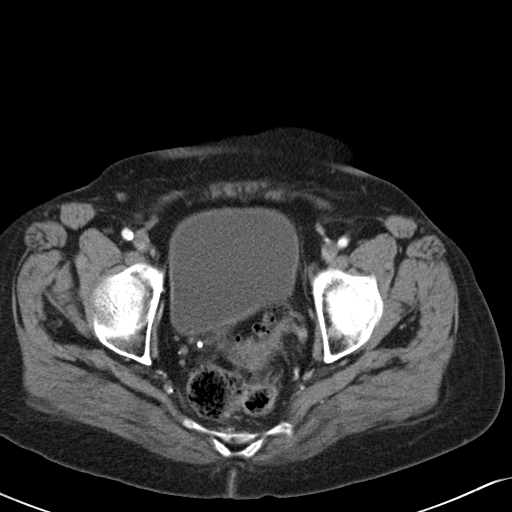
[im 26/114  soft-tissue]
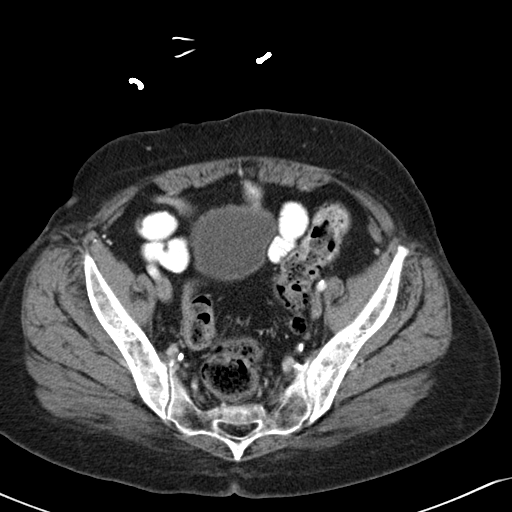
[im 32/114  soft-tissue]
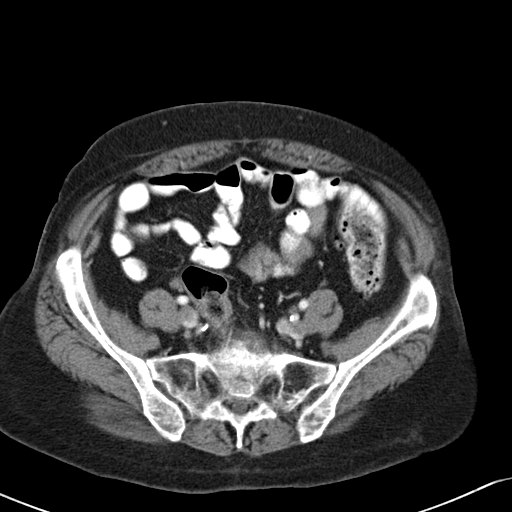
[im 44/114  soft-tissue]
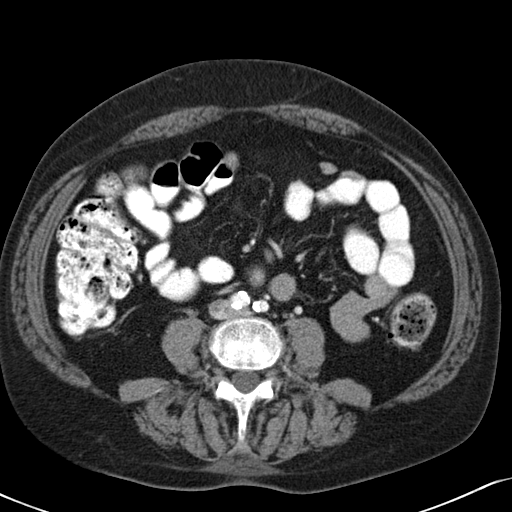
[im 51/114  soft-tissue]
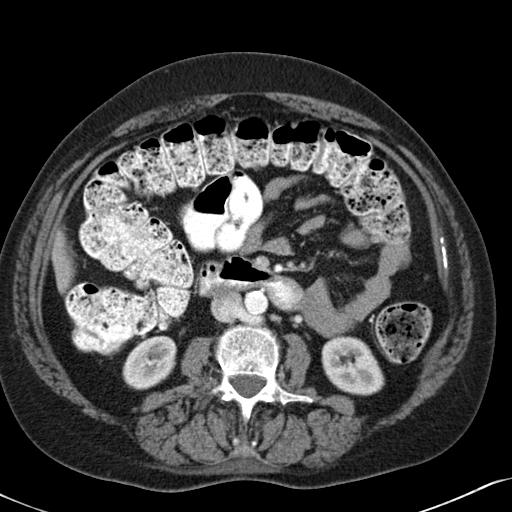
[im 63/114  soft-tissue]
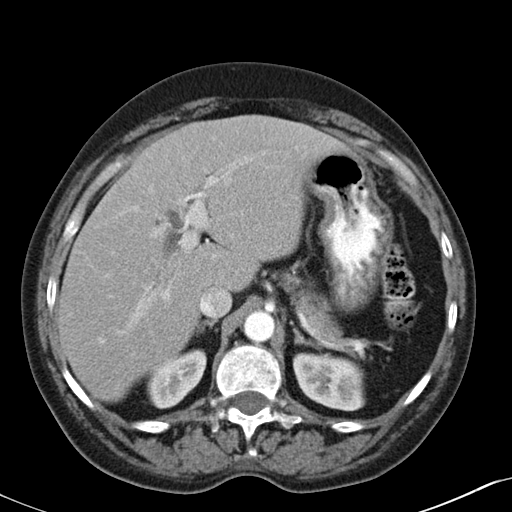
[im 70/114  soft-tissue]
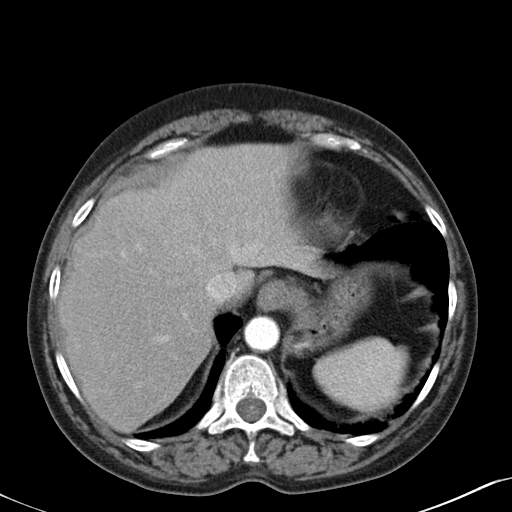
[im 82/114  soft-tissue]
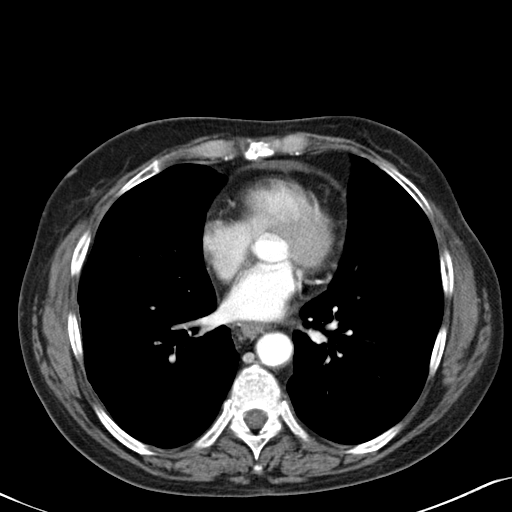
[im 82/114  bone]
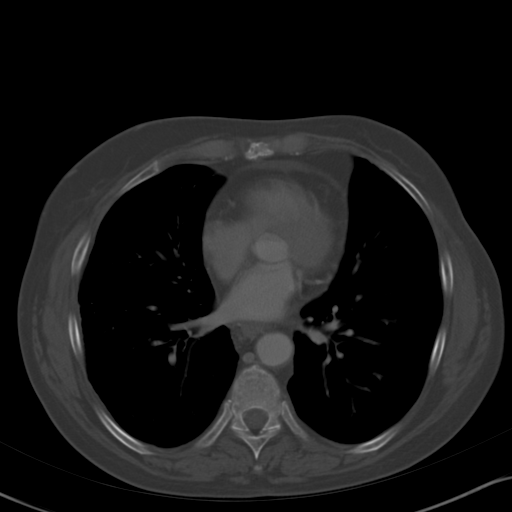
[im 88/114  soft-tissue]
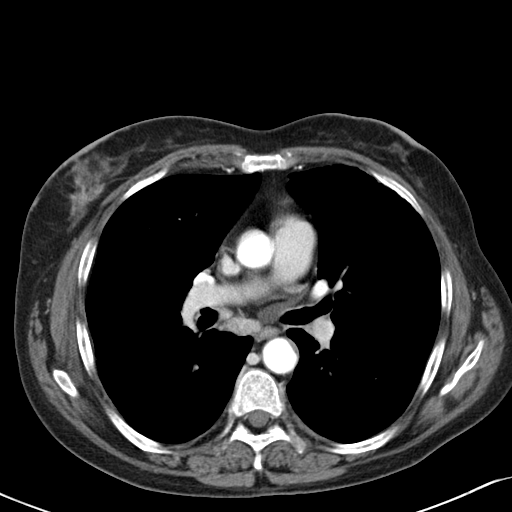
[im 95/114  soft-tissue]
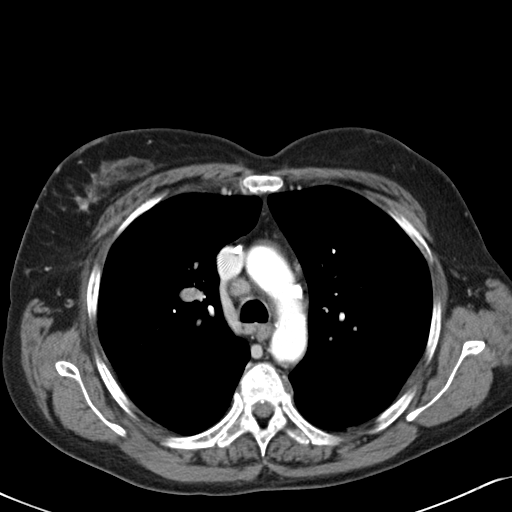
[im 107/114  soft-tissue]
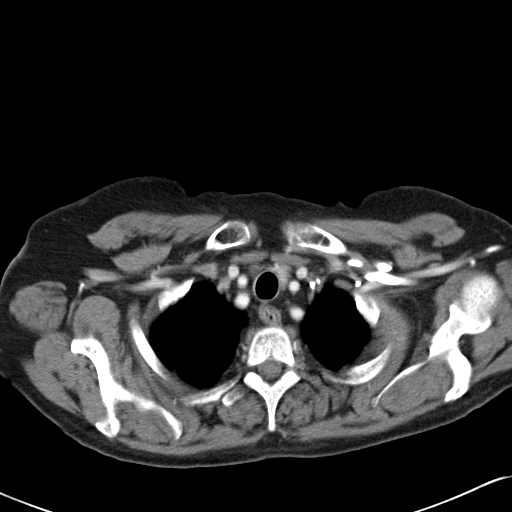

[Series 602: <mpr thick range> · coronal · 1.11mm/px · 3 of 90 slices shown]
[im 30/90  soft-tissue]
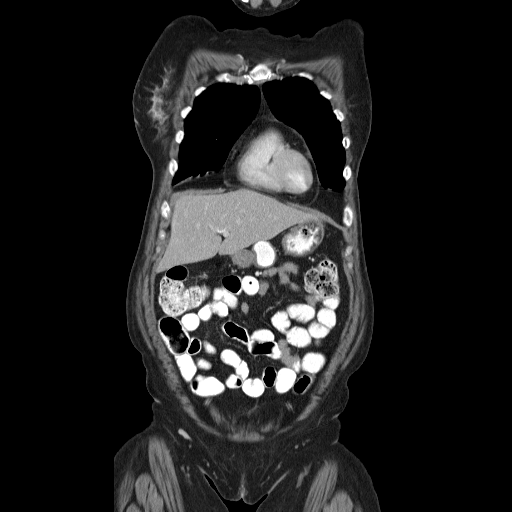
[im 40/90  soft-tissue]
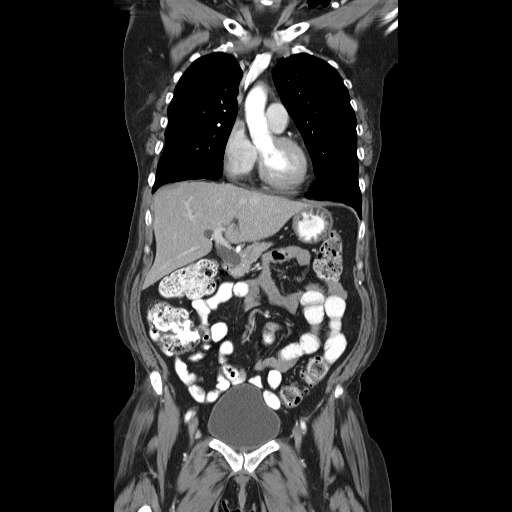
[im 50/90  soft-tissue]
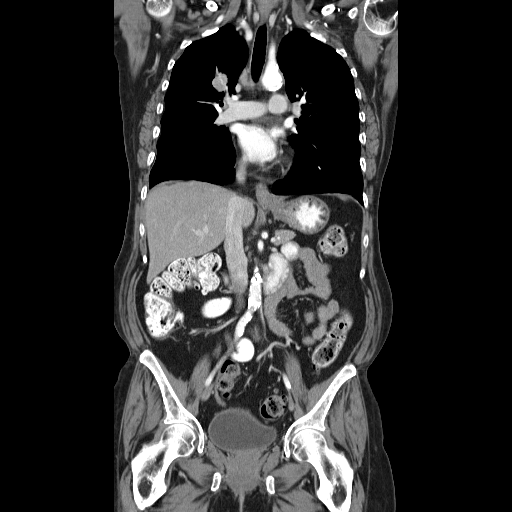

[15 of 46 positions shown; findings below may reference images not displayed]

FINDINGS: CT CHEST FINDINGS

5 mm hypodense right thyroid nodule.

Thoracic adenopathy observed with conglomerate right lower
paratracheal nodes have a short axis diameter of 1.2 cm (formerly
1.3 cm on the PET-CT of 01/05/2014) on image 18 of series 2. The
right upper lobe pulmonary nodule measures 2.2 by 1.9 cm on image 19
of series 4 (formerly 2.3 by 1.7 cm). A right upper paratracheal
lymph node measures 0.6 cm in short axis on image 13 of series 2.

A right axillary lymph node which previously had a short axis
parenchymal thickness of 1.2 cm currently has a short axis
parenchymal thickness of 0.6 cm on image 12 of series 2 (this
excludes the fatty hilum).

The hypermetabolic right breast mass is less bulky in size and
indistinctly marginated on today's exam. The degree of skin
thickening and subcutaneous edema in the vicinity of the mass is
significantly reduced.

Rim calcified of 1.4 cm lesion with fatty center noted at the 12
o'clock position in the left breast just above the areola.

There is a subcarinal node measuring 1 cm in short axis, image 25
series 2, previously not readily measurable. A right infrahilar node
just below the bronchus intermedius measures 1.2 cm in short axis
(formerly 1.0 cm) but was not formerly hypermetabolic

A lower paraesophageal lymph node measures 1.0 cm in short axis on
image 37 of series 2 (formerly 0.9 cm and not previously discernibly
hypermetabolic).

Centrilobular emphysema. The known scattered osseous metastatic
lesions throughout the thoracic skeleton rib fairly inconspicuous on
CT, and despite being widespread are fairly difficult to compare to
prior exams given the lack of easily identified individual lesions.
Mild atelectasis or scarring in both lower lobes along the
hemidiaphragms.

CT ABDOMEN AND PELVIS FINDINGS

Hepatobiliary: Dilated CBD at 1.7 cm with blunt termination near the
ampulla. Dorsal pancreatic duct is not dilated. Equivocal
intrahepatic biliary dilatation. Gallbladder surgically absent.
Diffuse hepatic steatosis.

Pancreas: Unremarkable

Spleen: Unremarkable

Adrenals/Urinary Tract: Unremarkable

Stomach/Bowel: Sigmoid diverticulosis without active diverticulitis.
Prominent stool throughout the colon favors constipation.

Vascular/Lymphatic: Aortoiliac atherosclerotic vascular disease.
cm left external iliac lymph node, not previously hypermetabolic,
similar in size to prior.

Reproductive: Uterus absent.  Ovaries not well seen.

Other: No supplemental non-categorized findings.

Musculoskeletal: As in the chest, there is vague heterogeneity in
the osseous structures indicative of osseous metastatic disease, but
difficult to quantify. Bone scan would probably be a better way to
followup.
IMPRESSION: 1. Primarily improved appearance, with significant reduction in size
of the right breast mass and in the surrounding edema, and
considerable reduction in the right hilar and subpectoral
adenopathy.
2. Essentially stable appearance of the right upper lobe pulmonary
nodule the and similar appearance of the right lower paratracheal
adenopathy. This may be a lung primary.
3. Slight enlargement of a right infrahilar lymph node currently
cm in short axis, but not formerly hypermetabolic. Equivocal
enlargement of a lower periesophageal lymph node in the thorax.
4. Aside from the indistinct diffuse osseous metastatic disease, no
specific findings of malignancy in the abdomen/pelvis.
5. Centrilobular emphysema.
6. Stably dilated common bile duct with blunt termination near the
ampulla. Cannot completely exclude a stricture although a component
of the biliary dilatation may be due to prior cholecystectomy.
7. Sigmoid diverticulosis.
8.  Prominent stool throughout the colon favors constipation.

## 2015-06-06 NOTE — Telephone Encounter (Signed)
error 

## 2015-12-01 ENCOUNTER — Other Ambulatory Visit: Payer: Self-pay | Admitting: Nurse Practitioner

## 2015-12-03 ENCOUNTER — Other Ambulatory Visit: Payer: Self-pay | Admitting: Nurse Practitioner

## 2017-06-22 ENCOUNTER — Encounter (HOSPITAL_COMMUNITY): Payer: Self-pay
